# Patient Record
Sex: Female | Born: 1970 | Race: Black or African American | Hispanic: No | Marital: Single | Smoking: Never smoker
Health system: Southern US, Community
[De-identification: ages and names within clinical notes are randomized; demographics above are authoritative.]

## PROBLEM LIST (undated history)

## (undated) DIAGNOSIS — F32A Depression, unspecified: Secondary | ICD-10-CM

## (undated) DIAGNOSIS — G4730 Sleep apnea, unspecified: Secondary | ICD-10-CM

## (undated) DIAGNOSIS — I10 Essential (primary) hypertension: Secondary | ICD-10-CM

## (undated) DIAGNOSIS — T7840XA Allergy, unspecified, initial encounter: Secondary | ICD-10-CM

## (undated) DIAGNOSIS — E119 Type 2 diabetes mellitus without complications: Secondary | ICD-10-CM

## (undated) DIAGNOSIS — I639 Cerebral infarction, unspecified: Secondary | ICD-10-CM

## (undated) DIAGNOSIS — I509 Heart failure, unspecified: Secondary | ICD-10-CM

## (undated) DIAGNOSIS — D649 Anemia, unspecified: Secondary | ICD-10-CM

## (undated) DIAGNOSIS — IMO0001 Reserved for inherently not codable concepts without codable children: Secondary | ICD-10-CM

## (undated) DIAGNOSIS — F419 Anxiety disorder, unspecified: Secondary | ICD-10-CM

## (undated) DIAGNOSIS — E785 Hyperlipidemia, unspecified: Secondary | ICD-10-CM

## (undated) DIAGNOSIS — I517 Cardiomegaly: Secondary | ICD-10-CM

## (undated) DIAGNOSIS — I4891 Unspecified atrial fibrillation: Secondary | ICD-10-CM

## (undated) HISTORY — DX: Anemia, unspecified: D64.9

## (undated) HISTORY — DX: Sleep apnea, unspecified: G47.30

## (undated) HISTORY — DX: Morbid (severe) obesity due to excess calories: E66.01

## (undated) HISTORY — DX: Depression, unspecified: F32.A

## (undated) HISTORY — DX: Heart failure, unspecified: I50.9

## (undated) HISTORY — DX: Hyperlipidemia, unspecified: E78.5

## (undated) HISTORY — DX: Essential (primary) hypertension: I10

## (undated) HISTORY — DX: Cerebral infarction, unspecified: I63.9

## (undated) HISTORY — DX: Allergy, unspecified, initial encounter: T78.40XA

---
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
## 1997-07-18 ENCOUNTER — Ambulatory Visit (HOSPITAL_COMMUNITY): Admission: RE | Admit: 1997-07-18 | Discharge: 1997-07-18 | Payer: Self-pay | Admitting: Obstetrics

## 1997-07-18 ENCOUNTER — Other Ambulatory Visit: Admission: RE | Admit: 1997-07-18 | Discharge: 1997-07-18 | Payer: Self-pay | Admitting: Obstetrics

## 1997-09-11 ENCOUNTER — Inpatient Hospital Stay (HOSPITAL_COMMUNITY): Admission: AD | Admit: 1997-09-11 | Discharge: 1997-09-11 | Payer: Self-pay | Admitting: Obstetrics

## 1997-09-12 ENCOUNTER — Ambulatory Visit (HOSPITAL_COMMUNITY): Admission: RE | Admit: 1997-09-12 | Discharge: 1997-09-12 | Payer: Self-pay | Admitting: Obstetrics

## 1997-11-09 ENCOUNTER — Ambulatory Visit (HOSPITAL_COMMUNITY): Admission: RE | Admit: 1997-11-09 | Discharge: 1997-11-09 | Payer: Self-pay | Admitting: Obstetrics

## 1997-11-23 ENCOUNTER — Inpatient Hospital Stay (HOSPITAL_COMMUNITY): Admission: RE | Admit: 1997-11-23 | Discharge: 1997-11-23 | Payer: Self-pay | Admitting: Obstetrics

## 1997-12-26 ENCOUNTER — Inpatient Hospital Stay (HOSPITAL_COMMUNITY): Admission: AD | Admit: 1997-12-26 | Discharge: 1997-12-26 | Payer: Self-pay | Admitting: Obstetrics

## 1998-01-08 ENCOUNTER — Inpatient Hospital Stay (HOSPITAL_COMMUNITY): Admission: AD | Admit: 1998-01-08 | Discharge: 1998-01-08 | Payer: Self-pay | Admitting: Obstetrics

## 1998-02-20 ENCOUNTER — Inpatient Hospital Stay (HOSPITAL_COMMUNITY): Admission: AD | Admit: 1998-02-20 | Discharge: 1998-02-20 | Payer: Self-pay | Admitting: Obstetrics

## 1998-02-26 ENCOUNTER — Inpatient Hospital Stay (HOSPITAL_COMMUNITY): Admission: AD | Admit: 1998-02-26 | Discharge: 1998-02-26 | Payer: Self-pay | Admitting: Obstetrics

## 1998-03-06 ENCOUNTER — Inpatient Hospital Stay (HOSPITAL_COMMUNITY): Admission: AD | Admit: 1998-03-06 | Discharge: 1998-03-06 | Payer: Self-pay | Admitting: Obstetrics

## 1998-03-11 ENCOUNTER — Inpatient Hospital Stay (HOSPITAL_COMMUNITY): Admission: AD | Admit: 1998-03-11 | Discharge: 1998-03-11 | Payer: Self-pay | Admitting: Obstetrics

## 1998-03-13 ENCOUNTER — Inpatient Hospital Stay (HOSPITAL_COMMUNITY): Admission: AD | Admit: 1998-03-13 | Discharge: 1998-03-16 | Payer: Self-pay | Admitting: Obstetrics

## 1998-03-16 ENCOUNTER — Encounter (HOSPITAL_COMMUNITY): Admission: RE | Admit: 1998-03-16 | Discharge: 1998-06-14 | Payer: Self-pay | Admitting: Obstetrics

## 1998-03-17 ENCOUNTER — Inpatient Hospital Stay (HOSPITAL_COMMUNITY): Admission: AD | Admit: 1998-03-17 | Discharge: 1998-03-17 | Payer: Self-pay | Admitting: Obstetrics

## 1998-03-22 ENCOUNTER — Inpatient Hospital Stay (HOSPITAL_COMMUNITY): Admission: AD | Admit: 1998-03-22 | Discharge: 1998-03-22 | Payer: Self-pay | Admitting: Obstetrics

## 1998-03-22 ENCOUNTER — Encounter: Payer: Self-pay | Admitting: *Deleted

## 1998-12-20 ENCOUNTER — Emergency Department (HOSPITAL_COMMUNITY): Admission: EM | Admit: 1998-12-20 | Discharge: 1998-12-20 | Payer: Self-pay | Admitting: Emergency Medicine

## 1999-07-21 ENCOUNTER — Emergency Department (HOSPITAL_COMMUNITY): Admission: EM | Admit: 1999-07-21 | Discharge: 1999-07-21 | Payer: Self-pay | Admitting: Emergency Medicine

## 1999-09-28 ENCOUNTER — Emergency Department (HOSPITAL_COMMUNITY): Admission: EM | Admit: 1999-09-28 | Discharge: 1999-09-28 | Payer: Self-pay | Admitting: Emergency Medicine

## 1999-10-27 ENCOUNTER — Encounter: Payer: Self-pay | Admitting: Family Medicine

## 1999-10-27 ENCOUNTER — Ambulatory Visit (HOSPITAL_COMMUNITY): Admission: RE | Admit: 1999-10-27 | Discharge: 1999-10-27 | Payer: Self-pay | Admitting: Family Medicine

## 2000-04-03 ENCOUNTER — Emergency Department (HOSPITAL_COMMUNITY): Admission: EM | Admit: 2000-04-03 | Discharge: 2000-04-03 | Payer: Self-pay | Admitting: Emergency Medicine

## 2000-10-24 ENCOUNTER — Emergency Department (HOSPITAL_COMMUNITY): Admission: EM | Admit: 2000-10-24 | Discharge: 2000-10-24 | Payer: Self-pay | Admitting: Emergency Medicine

## 2001-12-19 ENCOUNTER — Other Ambulatory Visit: Admission: RE | Admit: 2001-12-19 | Discharge: 2001-12-19 | Payer: Self-pay | Admitting: *Deleted

## 2001-12-27 ENCOUNTER — Encounter: Payer: Self-pay | Admitting: *Deleted

## 2001-12-27 ENCOUNTER — Ambulatory Visit (HOSPITAL_COMMUNITY): Admission: RE | Admit: 2001-12-27 | Discharge: 2001-12-27 | Payer: Self-pay | Admitting: *Deleted

## 2002-01-01 ENCOUNTER — Emergency Department (HOSPITAL_COMMUNITY): Admission: EM | Admit: 2002-01-01 | Discharge: 2002-01-01 | Payer: Self-pay

## 2002-03-18 ENCOUNTER — Emergency Department (HOSPITAL_COMMUNITY): Admission: EM | Admit: 2002-03-18 | Discharge: 2002-03-18 | Payer: Self-pay | Admitting: Emergency Medicine

## 2002-07-07 ENCOUNTER — Emergency Department (HOSPITAL_COMMUNITY): Admission: EM | Admit: 2002-07-07 | Discharge: 2002-07-08 | Payer: Self-pay | Admitting: Emergency Medicine

## 2002-07-07 ENCOUNTER — Encounter: Payer: Self-pay | Admitting: Emergency Medicine

## 2002-12-24 ENCOUNTER — Emergency Department (HOSPITAL_COMMUNITY): Admission: EM | Admit: 2002-12-24 | Discharge: 2002-12-24 | Payer: Self-pay | Admitting: Emergency Medicine

## 2003-10-05 ENCOUNTER — Other Ambulatory Visit: Admission: RE | Admit: 2003-10-05 | Discharge: 2003-10-05 | Payer: Self-pay | Admitting: Family Medicine

## 2006-03-31 ENCOUNTER — Other Ambulatory Visit: Admission: RE | Admit: 2006-03-31 | Discharge: 2006-03-31 | Payer: Self-pay | Admitting: Family Medicine

## 2006-05-16 ENCOUNTER — Inpatient Hospital Stay (HOSPITAL_COMMUNITY): Admission: AD | Admit: 2006-05-16 | Discharge: 2006-05-16 | Payer: Self-pay | Admitting: Obstetrics and Gynecology

## 2007-08-22 ENCOUNTER — Emergency Department (HOSPITAL_COMMUNITY): Admission: EM | Admit: 2007-08-22 | Discharge: 2007-08-22 | Payer: Self-pay | Admitting: Emergency Medicine

## 2008-11-04 ENCOUNTER — Ambulatory Visit: Payer: Self-pay | Admitting: Vascular Surgery

## 2008-11-04 ENCOUNTER — Emergency Department (HOSPITAL_COMMUNITY): Admission: EM | Admit: 2008-11-04 | Discharge: 2008-11-04 | Payer: Self-pay | Admitting: Emergency Medicine

## 2008-11-04 ENCOUNTER — Emergency Department (HOSPITAL_COMMUNITY): Admission: EM | Admit: 2008-11-04 | Discharge: 2008-11-04 | Payer: Self-pay | Admitting: Family Medicine

## 2008-11-04 ENCOUNTER — Encounter (INDEPENDENT_AMBULATORY_CARE_PROVIDER_SITE_OTHER): Payer: Self-pay | Admitting: Emergency Medicine

## 2008-11-28 ENCOUNTER — Ambulatory Visit: Payer: Self-pay | Admitting: Internal Medicine

## 2008-11-28 DIAGNOSIS — J309 Allergic rhinitis, unspecified: Secondary | ICD-10-CM | POA: Insufficient documentation

## 2008-11-28 DIAGNOSIS — D509 Iron deficiency anemia, unspecified: Secondary | ICD-10-CM | POA: Insufficient documentation

## 2008-11-28 DIAGNOSIS — I10 Essential (primary) hypertension: Secondary | ICD-10-CM | POA: Insufficient documentation

## 2008-11-28 LAB — CONVERTED CEMR LAB
ALT: 38 units/L — ABNORMAL HIGH (ref 0–35)
AST: 36 units/L (ref 0–37)
Albumin: 3.7 g/dL (ref 3.5–5.2)
Alkaline Phosphatase: 76 units/L (ref 39–117)
BUN: 10 mg/dL (ref 6–23)
Basophils Absolute: 0 10*3/uL (ref 0.0–0.1)
Basophils Relative: 0.2 % (ref 0.0–3.0)
Bilirubin, Direct: 0 mg/dL (ref 0.0–0.3)
CO2: 29 meq/L (ref 19–32)
Calcium: 9.2 mg/dL (ref 8.4–10.5)
Chloride: 100 meq/L (ref 96–112)
Creatinine, Ser: 0.6 mg/dL (ref 0.4–1.2)
Eosinophils Absolute: 0.1 10*3/uL (ref 0.0–0.7)
Eosinophils Relative: 1.2 % (ref 0.0–5.0)
Folate: 10.1 ng/mL
GFR calc non Af Amer: 145.27 mL/min (ref >60)
Glucose, Bld: 114 mg/dL — ABNORMAL HIGH (ref 70–99)
HCT: 32.4 % — ABNORMAL LOW (ref 36.0–46.0)
Hemoglobin: 10.1 g/dL — ABNORMAL LOW (ref 12.0–15.0)
Iron: 46 ug/dL (ref 42–145)
Lymphocytes Relative: 32 % (ref 12.0–46.0)
Lymphs Abs: 2 10*3/uL (ref 0.7–4.0)
MCHC: 31.1 g/dL (ref 30.0–36.0)
MCV: 72.2 fL — ABNORMAL LOW (ref 78.0–100.0)
Monocytes Absolute: 0.5 10*3/uL (ref 0.1–1.0)
Monocytes Relative: 7.5 % (ref 3.0–12.0)
Neutro Abs: 3.5 10*3/uL (ref 1.4–7.7)
Neutrophils Relative %: 59.1 % (ref 43.0–77.0)
Platelets: 309 10*3/uL (ref 150.0–400.0)
Potassium: 3.5 meq/L (ref 3.5–5.1)
RBC: 4.49 M/uL (ref 3.87–5.11)
RDW: 18 % — ABNORMAL HIGH (ref 11.5–14.6)
Saturation Ratios: 10.4 % — ABNORMAL LOW (ref 20.0–50.0)
Sodium: 138 meq/L (ref 135–145)
TSH: 0.75 microintl units/mL (ref 0.35–5.50)
Total Bilirubin: 0.4 mg/dL (ref 0.3–1.2)
Total Protein: 7.8 g/dL (ref 6.0–8.3)
Transferrin: 316.3 mg/dL (ref 212.0–360.0)
Vitamin B-12: 524 pg/mL (ref 211–911)
WBC: 6.1 10*3/uL (ref 4.5–10.5)

## 2008-11-29 ENCOUNTER — Encounter: Payer: Self-pay | Admitting: Internal Medicine

## 2008-11-29 ENCOUNTER — Telehealth: Payer: Self-pay | Admitting: Internal Medicine

## 2008-12-18 ENCOUNTER — Ambulatory Visit: Payer: Self-pay | Admitting: Internal Medicine

## 2008-12-18 ENCOUNTER — Encounter (INDEPENDENT_AMBULATORY_CARE_PROVIDER_SITE_OTHER): Payer: Self-pay | Admitting: *Deleted

## 2008-12-19 ENCOUNTER — Telehealth: Payer: Self-pay | Admitting: Internal Medicine

## 2008-12-19 ENCOUNTER — Encounter (INDEPENDENT_AMBULATORY_CARE_PROVIDER_SITE_OTHER): Payer: Self-pay | Admitting: *Deleted

## 2008-12-20 ENCOUNTER — Encounter: Payer: Self-pay | Admitting: Internal Medicine

## 2009-01-01 ENCOUNTER — Encounter: Payer: Self-pay | Admitting: Internal Medicine

## 2009-03-06 ENCOUNTER — Ambulatory Visit: Payer: Self-pay | Admitting: Internal Medicine

## 2009-03-24 ENCOUNTER — Ambulatory Visit: Payer: Self-pay | Admitting: Diagnostic Radiology

## 2009-08-22 ENCOUNTER — Other Ambulatory Visit: Admission: RE | Admit: 2009-08-22 | Discharge: 2009-08-22 | Payer: Self-pay | Admitting: Internal Medicine

## 2009-08-22 ENCOUNTER — Ambulatory Visit: Payer: Self-pay | Admitting: Internal Medicine

## 2009-08-22 DIAGNOSIS — N760 Acute vaginitis: Secondary | ICD-10-CM | POA: Insufficient documentation

## 2009-08-22 LAB — CONVERTED CEMR LAB
Basophils Absolute: 0 10*3/uL (ref 0.0–0.1)
Basophils Relative: 0.5 % (ref 0.0–3.0)
Chlamydia, DNA Probe: NEGATIVE
Clue Cells Wet Prep HPF POC: NONE SEEN
Eosinophils Absolute: 0.1 10*3/uL (ref 0.0–0.7)
Eosinophils Relative: 0.6 % (ref 0.0–5.0)
GC Probe Amp, Genital: NEGATIVE
HCT: 31.5 % — ABNORMAL LOW (ref 36.0–46.0)
Hemoglobin: 9.8 g/dL — ABNORMAL LOW (ref 12.0–15.0)
Iron: 37 ug/dL — ABNORMAL LOW (ref 42–145)
Lymphocytes Relative: 21.2 % (ref 12.0–46.0)
Lymphs Abs: 1.8 10*3/uL (ref 0.7–4.0)
MCHC: 31.3 g/dL (ref 30.0–36.0)
MCV: 70 fL — ABNORMAL LOW (ref 78.0–100.0)
Monocytes Absolute: 0.4 10*3/uL (ref 0.1–1.0)
Monocytes Relative: 4.6 % (ref 3.0–12.0)
Neutro Abs: 6.2 10*3/uL (ref 1.4–7.7)
Neutrophils Relative %: 73.1 % (ref 43.0–77.0)
Pap Smear: NEGATIVE
Platelets: 327 10*3/uL (ref 150.0–400.0)
RBC: 4.5 M/uL (ref 3.87–5.11)
RDW: 17.9 % — ABNORMAL HIGH (ref 11.5–14.6)
Saturation Ratios: 7.9 % — ABNORMAL LOW (ref 20.0–50.0)
Transferrin: 333.7 mg/dL (ref 212.0–360.0)
Trich, Wet Prep: NONE SEEN
WBC: 8.5 10*3/uL (ref 4.5–10.5)
Yeast Wet Prep HPF POC: NONE SEEN
hCG, Beta Chain, Quant, S: <0.5 milliintl units/mL

## 2009-08-22 LAB — HM PAP SMEAR

## 2009-08-26 ENCOUNTER — Telehealth: Payer: Self-pay | Admitting: Internal Medicine

## 2009-08-28 ENCOUNTER — Encounter: Payer: Self-pay | Admitting: Internal Medicine

## 2010-02-20 ENCOUNTER — Emergency Department (HOSPITAL_BASED_OUTPATIENT_CLINIC_OR_DEPARTMENT_OTHER): Admission: EM | Admit: 2010-02-20 | Discharge: 2009-03-24 | Payer: Self-pay | Admitting: Emergency Medicine

## 2010-04-13 LAB — CONVERTED CEMR LAB
ALT: 24 units/L (ref 0–35)
AST: 29 units/L (ref 0–37)
Albumin: 3.7 g/dL (ref 3.5–5.2)
Alkaline Phosphatase: 74 units/L (ref 39–117)
BUN: 2 mg/dL — ABNORMAL LOW (ref 6–23)
Basophils Absolute: 0 10*3/uL (ref 0.0–0.1)
Basophils Relative: 0.9 % (ref 0.0–3.0)
Bilirubin Urine: NEGATIVE
Bilirubin, Direct: 0 mg/dL (ref 0.0–0.3)
CO2: 30 meq/L (ref 19–32)
Calcium: 8.9 mg/dL (ref 8.4–10.5)
Chloride: 103 meq/L (ref 96–112)
Creatinine, Ser: 0.6 mg/dL (ref 0.4–1.2)
Eosinophils Absolute: 0.1 10*3/uL (ref 0.0–0.7)
Eosinophils Relative: 2 % (ref 0.0–5.0)
GFR calc non Af Amer: 145.05 mL/min (ref >60)
Glucose, Bld: 98 mg/dL (ref 70–99)
HCT: 32.8 % — ABNORMAL LOW (ref 36.0–46.0)
Hemoglobin: 10.2 g/dL — ABNORMAL LOW (ref 12.0–15.0)
Iron: 11 ug/dL — ABNORMAL LOW (ref 42–145)
Ketones, ur: NEGATIVE mg/dL
Leukocytes, UA: NEGATIVE
Lymphocytes Relative: 38 % (ref 12.0–46.0)
Lymphs Abs: 1.8 10*3/uL (ref 0.7–4.0)
MCHC: 31.1 g/dL (ref 30.0–36.0)
MCV: 74.6 fL — ABNORMAL LOW (ref 78.0–100.0)
Monocytes Absolute: 0.3 10*3/uL (ref 0.1–1.0)
Monocytes Relative: 7.4 % (ref 3.0–12.0)
Neutro Abs: 2.5 10*3/uL (ref 1.4–7.7)
Neutrophils Relative %: 51.7 % (ref 43.0–77.0)
Nitrite: NEGATIVE
Platelets: 317 10*3/uL (ref 150.0–400.0)
Potassium: 3.5 meq/L (ref 3.5–5.1)
RBC: 4.4 M/uL (ref 3.87–5.11)
RDW: 17.2 % — ABNORMAL HIGH (ref 11.5–14.6)
Saturation Ratios: 2.6 % — ABNORMAL LOW (ref 20.0–50.0)
Sodium: 139 meq/L (ref 135–145)
Specific Gravity, Urine: 1.01 (ref 1.000–1.030)
TSH: 0.58 microintl units/mL (ref 0.35–5.50)
Total Bilirubin: 0.5 mg/dL (ref 0.3–1.2)
Total Protein: 7.5 g/dL (ref 6.0–8.3)
Transferrin: 296.7 mg/dL (ref 212.0–360.0)
Urine Glucose: NEGATIVE mg/dL
Urobilinogen, UA: 0.2 (ref 0.0–1.0)
WBC: 4.7 10*3/uL (ref 4.5–10.5)
pH: 8.5 (ref 5.0–8.0)

## 2010-04-17 NOTE — Assessment & Plan Note (Signed)
Summary: VAGINAL DISCHARGE/ WANTS A PAP/NWS   Vital Signs:  Patient profile:   40 year old female Menstrual status:  regular LMP:     07/28/2009 Height:      62 inches Weight:      274 pounds O2 Sat:      98 % on Room air Temp:     99.1 degrees F oral Pulse rate:   80 / minute Pulse rhythm:   regular Resp:     16 per minute BP sitting:   138 / 88  O2 Flow:  Room air  Primary Care Provider:  Etta Grandchild MD   History of Present Illness:  Vaginal Discharge      This is a 40 year old woman who presents with Vaginal discharge.  The symptoms began 2 weeks ago.  The severity is described as moderate.  The patient reports itching and vaginal burning, but denies burning on urination, frequency, urgency, fever, pelvic pain, and back pain.  The discharge is described as white and purulent and foul-smelling.  The patient denies the following symptoms: genital sores, unusual vaginal bleeding, painful intercourse, rash, myalgias, arthralgias, and headache.    Preventive Screening-Counseling & Management  Alcohol-Tobacco     Alcohol drinks/day: 0     Smoking Status: never  Hep-HIV-STD-Contraception     Hepatitis Risk: no risk noted     HIV Risk: no risk noted     STD Risk: risk noted     STD Risk Counseling: to avoid increased STD risk      Sexual History:  currently monogamous.        Drug Use:  never.        Blood Transfusions:  no.    Medications Prior to Update: 1)  Flax Seed Oil 1000 Mg Caps (Flaxseed (Linseed)) .... One By Mouth Once Daily  Current Medications (verified): 1)  Flax Seed Oil 1000 Mg Caps (Flaxseed (Linseed)) .... One By Mouth Once Daily 2)  Metronidazole 500 Mg Tabs (Metronidazole) .... One By Mouth Two Times A Day For 7 Days  Allergies (verified): 1)  ! Lisinopril (Lisinopril)  Past History:  Past Medical History: Last updated: 12/18/2008 Anemia-NOS Hypertension Allergic rhinitis morbid obesity  Past Surgical History: Last updated:  12/18/2008 right knee 1992 - ?arthroscopic  Family History: Last updated: 11/28/2008 Family History High cholesterol Family History Hypertension  Social History: Last updated: 11/28/2008 Occupation: Pre K teacher Married Never Smoked Alcohol use-no Drug use-no Regular exercise-yes  Risk Factors: Alcohol Use: 0 (08/22/2009) Exercise: yes (11/28/2008)  Risk Factors: Smoking Status: never (08/22/2009)  Family History: Reviewed history from 11/28/2008 and no changes required. Family History High cholesterol Family History Hypertension  Social History: Reviewed history from 11/28/2008 and no changes required. Occupation: Pre K teacher Married Never Smoked Alcohol use-no Drug use-no Regular exercise-yes Hepatitis Risk:  no risk noted HIV Risk:  no risk noted STD Risk:  risk noted Sexual History:  currently monogamous Drug Use:  never Blood Transfusions:  no  Review of Systems  The patient denies anorexia, fever, weight loss, chest pain, headaches, hemoptysis, abdominal pain, hematuria, genital sores, suspicious skin lesions, abnormal bleeding, and enlarged lymph nodes.   GU:  Complains of discharge; denies abnormal vaginal bleeding, decreased libido, dysuria, genital sores, hematuria, nocturia, urinary frequency, and urinary hesitancy.  Physical Exam  General:  obese, alert, well-developed, well-nourished, and cooperative to examination.   overweight-appearing.   Mouth:  Oral mucosa and oropharynx without lesions or exudates.  Teeth in good repair.  Neck:  supple, full ROM, no masses, no thyromegaly, no JVD, normal carotid upstroke, and no carotid bruits.   Lungs:  normal respiratory effort, no intercostal retractions or use of accessory muscles; normal breath sounds bilaterally - no crackles and no wheezes.    Heart:  normal rate, regular rhythm, no murmur, and no rub. BLE without edema.  Abdomen:  soft, non-tender, normal bowel sounds, no distention, no masses, no  guarding, no hepatomegaly, and no splenomegaly.   Genitalia:  vaginal discharge. very scant and foul/fishy odor. normal introitus, no external lesions, mucosa pink and moist, no vaginal or cervical lesions, no vaginal atrophy, no friaility or hemorrhage, normal uterus size and position, no adnexal masses or tenderness, and vaginal discharge.   Msk:  normal ROM, no joint tenderness, no joint swelling, no joint warmth, no redness over joints, and no joint deformities.   Pulses:  R and L carotid,radial,femoral,dorsalis pedis and posterior tibial pulses are full and equal bilaterally Extremities:  No clubbing, cyanosis, edema, or deformity noted with normal full range of motion of all joints.   Neurologic:  No cranial nerve deficits noted. Station and gait are normal. Plantar reflexes are down-going bilaterally. DTRs are symmetrical throughout. Sensory, motor and coordinative functions appear intact. Skin:  turgor normal, color normal, no rashes, no suspicious lesions, no ecchymoses, no ulcerations, and no edema.   Cervical Nodes:  no anterior cervical adenopathy and no posterior cervical adenopathy.   Inguinal Nodes:  no R inguinal adenopathy and no L inguinal adenopathy.   Psych:  Cognition and judgment appear intact. Alert and cooperative with normal attention span and concentration. No apparent delusions, illusions, hallucinations   Impression & Recommendations:  Problem # 1:  VAGINITIS (ICD-616.10) this appears to be BV or gardnerella, wil screen for other causes  Her updated medication list for this problem includes:    Metronidazole 500 Mg Tabs (Metronidazole) ..... One by mouth two times a day for 7 days  Orders: TLB-Wet [place] / Fungus (87210-WPREP) T-Chlamydia Probe, genital [phone]) T-GC Probe, genital [phone]) Venipuncture (29562) TLB-CBC Platelet - w/Differential (85025-CBCD) TLB-IBC Pnl (Iron/FE;Transferrin) (83550-IBC) TLB-Preg Serum Quant (B-hCG)  (84702-HCG-QN)  Problem # 2:  ANEMIA-NOS (ICD-285.9) Assessment: Unchanged  Orders: Venipuncture (13086) TLB-CBC Platelet - w/Differential (85025-CBCD) TLB-IBC Pnl (Iron/FE;Transferrin) (83550-IBC) TLB-Preg Serum Quant (B-hCG) (84702-HCG-QN)  Hgb: 10.2 (03/06/2009)   Hct: 32.8 (03/06/2009)   Platelets: 317.0 (03/06/2009) RBC: 4.40 (03/06/2009)   RDW: 17.2 H % (03/06/2009)   WBC: 4.7 (03/06/2009) MCV: 74.6 (03/06/2009)   MCHC: 31.1 (03/06/2009) Iron: 11 (03/06/2009)   % Sat: 2.6 (03/06/2009) B12: 524 (11/28/2008)   Folate: 10.1 (11/28/2008)   TSH: 0.58 (03/06/2009)  Problem # 3:  HYPERTENSION (ICD-401.9) Assessment: Improved  BP today: 138/88 Prior BP: 124/74 (03/06/2009)  Prior 10 Yr Risk Heart Disease: Not enough information (11/28/2008)  Labs Reviewed: K+: 3.5 (03/06/2009) Creat: : 0.6 (03/06/2009)     Problem # 4:  MORBID OBESITY (ICD-278.01) Assessment: Unchanged  Ht: 62 (08/22/2009)   Wt: 274 (08/22/2009)   BMI: 50.30 (03/06/2009)  Complete Medication List: 1)  Flax Seed Oil 1000 Mg Caps (Flaxseed (linseed)) .... One by mouth once daily 2)  Metronidazole 500 Mg Tabs (Metronidazole) .... One by mouth two times a day for 7 days  Patient Instructions: 1)  Please schedule a follow-up appointment in 2 weeks. 2)  Take your antibiotic as prescribed until ALL of it is gone, but stop if you develop a rash or swelling and contact our office as soon as possible. Prescriptions:  METRONIDAZOLE 500 MG TABS (METRONIDAZOLE) One by mouth two times a day for 7 days  #14 x 0   Entered and Authorized by:   Etta Grandchild MD   Signed by:   Etta Grandchild MD on 08/22/2009   Method used:   Electronically to        [facility] [phone]* (retail)       [address]       [place], [place]  [address]       Ph: [phone]       Fax: [phone]   RxID:   [id number]

## 2010-04-17 NOTE — Letter (Signed)
Summary: Results Follow-up Letter  [facility]  [address] [place], [place] [address]   Phone: [phone]  Fax: [phone]    08/28/2009  [address] [place], [place]  [address]  Dear Ms. Polsky,   The following are the results of your recent test(s):  Test     Result     Pap Smear    Normal___xx____  Not Normal_____        Comments:trichomonas infection noted    _________________________________________________________  Please call for an appointment soon _________________________________________________________ _________________________________________________________ _________________________________________________________  Sincerely,  Sanda Linger MD [facility]

## 2010-04-17 NOTE — Progress Notes (Signed)
  Phone Note Call from Patient Call back at Home Phone [phone]   Caller: Patient Summary of Call: Patient called requesting results of recent labs. Thanks Initial call taken by: Rock Nephew CMA,  August 26, 2009 4:39 PM  Follow-up for Phone Call        mild anemia and low iron level, infection screens were negative, still waiting for PAP results Follow-up by: Etta Grandchild MD,  August 27, 2009 7:14 AM  Additional Follow-up for Phone Call Additional follow up Details #1::        Patient notified and pap letter mailed Additional Follow-up by: Rock Nephew CMA,  August 28, 2009 9:23 AM

## 2010-05-13 ENCOUNTER — Ambulatory Visit (INDEPENDENT_AMBULATORY_CARE_PROVIDER_SITE_OTHER): Payer: BC Managed Care – PPO | Admitting: Internal Medicine

## 2010-05-13 ENCOUNTER — Other Ambulatory Visit: Payer: Self-pay | Admitting: Internal Medicine

## 2010-05-13 ENCOUNTER — Encounter: Payer: Self-pay | Admitting: Internal Medicine

## 2010-05-13 ENCOUNTER — Other Ambulatory Visit: Payer: BC Managed Care – PPO

## 2010-05-13 DIAGNOSIS — D649 Anemia, unspecified: Secondary | ICD-10-CM

## 2010-05-13 DIAGNOSIS — J309 Allergic rhinitis, unspecified: Secondary | ICD-10-CM

## 2010-05-13 LAB — CBC WITH DIFFERENTIAL/PLATELET
Basophils Absolute: 0 10*3/uL (ref 0.0–0.1)
Basophils Relative: 0.1 % (ref 0.0–3.0)
Eosinophils Absolute: 0.1 10*3/uL (ref 0.0–0.7)
Eosinophils Relative: 3.3 % (ref 0.0–5.0)
HCT: 28.8 % — ABNORMAL LOW (ref 36.0–46.0)
Hemoglobin: 9 g/dL — ABNORMAL LOW (ref 12.0–15.0)
Lymphocytes Relative: 22.5 % (ref 12.0–46.0)
Lymphs Abs: 0.6 10*3/uL — ABNORMAL LOW (ref 0.7–4.0)
MCHC: 31.3 g/dL (ref 30.0–36.0)
MCV: 67.5 fl — ABNORMAL LOW (ref 78.0–100.0)
Monocytes Absolute: 0.5 10*3/uL (ref 0.1–1.0)
Monocytes Relative: 17.7 % — ABNORMAL HIGH (ref 3.0–12.0)
Neutro Abs: 1.6 10*3/uL (ref 1.4–7.7)
Neutrophils Relative %: 56.4 % (ref 43.0–77.0)
Platelets: 196 10*3/uL (ref 150.0–400.0)
RBC: 4.3 Mil/uL (ref 3.87–5.11)
RDW: 19.3 % — ABNORMAL HIGH (ref 11.5–14.6)
WBC: 2.8 10*3/uL — ABNORMAL LOW (ref 4.5–10.5)

## 2010-05-13 LAB — TSH: TSH: 0.7 u[IU]/mL (ref 0.35–5.50)

## 2010-05-13 LAB — IBC PANEL
Iron: 160 ug/dL — ABNORMAL HIGH (ref 42–145)
Saturation Ratios: 38.6 % (ref 20.0–50.0)
Transferrin: 296.3 mg/dL (ref 212.0–360.0)

## 2010-05-14 ENCOUNTER — Encounter: Payer: Self-pay | Admitting: Internal Medicine

## 2010-05-14 ENCOUNTER — Ambulatory Visit: Payer: Self-pay | Admitting: Internal Medicine

## 2010-05-22 NOTE — Assessment & Plan Note (Signed)
Summary: BP PROBLEM / DRY COUGH /FATIGUE/JONES/NWS   Vital Signs:  Patient profile:   40 year old female Menstrual status:  regular Height:      62 inches Weight:      266 pounds BMI:     48.83 O2 Sat:      95 % on Room air Temp:     98.8 degrees F oral Pulse rate:   89 / minute BP sitting:   144 / 88  (left arm) Cuff size:   large  Vitals Entered By: Bill Salinas CMA (May 13, 2010 9:54 AM)  O2 Flow:  Room air CC: pt here for eval of elevated BP with weakness and fatigue-dry cough/ ab   Primary Care Provider:  Etta Grandchild MD  CC:  pt here for eval of elevated BP with weakness and fatigue-dry cough/ ab.  History of Present Illness: Patient presents with multiple medical problems: 1. fatigue and malaise for 3 weeks with sudden on-set. She is able to go to work but that's it.  2. She feels sick: alternating between sweats and cold. She has diffuse myalgias. She has had a cough since Saturday. Pressure in the sinus area above her eyes. Her cough is productive of yellowish sputum. She has productive rhinorrhea with a colored mucus. She has a metallic taste in her mouth 3. She has diffuse chest pain, not worse with inspiration. She denies SOB. This started after her cough. No family h/o CAD in young women.  4. Tightness in her neck with question of lymph nodes in the posterior cervical chain.   Chart review: Sept '09 anemia with iron deficiency. Dec '10 Fe 11, % sat 2.6%; June '11 Hgb 9.8, Fe 37, % sat 7.9%  Current Medications (verified): 1)  Flax Seed Oil 1000 Mg Caps (Flaxseed (Linseed)) .... One By Mouth Once Daily  Allergies (verified): 1)  ! Lisinopril (Lisinopril)  Past History:  Past Medical History: Last updated: 12/18/2008 Anemia-NOS Hypertension Allergic rhinitis morbid obesity  Past Surgical History: Last updated: 12/18/2008 right knee 1992 - ?arthroscopic  Family History: Father - '52: CAD/CHF, h/o MI and has an AICD Mother- '49: HTN Family  History High cholesterol Family History Hypertension  Review of Systems       The patient complains of chest pain and headaches.  The patient denies anorexia, fever, weight loss, weight gain, syncope, peripheral edema, abdominal pain, severe indigestion/heartburn, muscle weakness, difficulty walking, unusual weight change, abnormal bleeding, and enlarged lymph nodes.         She admits to snoring, no report of breath-holding.  Physical Exam  General:  obese AA female who appears very tired. Head:  normocephalic and atraumatic.  Mild tenderness to percussion over the frontal and maxillary tenderness Eyes:  pupils equal and pupils round.   Ears:  EAC TMs normal Nose:  no external deformity and no external erythema.   Neck:  supple, full ROM, and no thyromegaly.   Chest Wall:  mild tenderness to light percussion/palpation anterior chest wall Lungs:  normal respiratory effort, no crackles, and no wheezes.   Heart:  normal rate and regular rhythm.   Abdomen:  obese Msk:  normal ROM, no joint tenderness, no joint swelling, and no joint warmth.   Pulses:  2+ radial pulse Neurologic:  alert & oriented X3 and cranial nerves II-XII intact.     Impression & Recommendations:  Problem # 1:  ALLERGIC RHINITIS (ICD-477.9) patient with h/o allergic rhinnitis now with possible infection.  Plan - Amoxicillin  875 mg  two times a day           nasal saline  Problem # 2:  ANEMIA-NOS (ICD-285.9)  Patinet with previous lab with profound iron deficiency with Fe % sat of 2.6% up to 7.9% This may be a cause of her fatigue.  Plan - lab: iron panel and Hgb           iron replacement therapy - 325mg  two times a day.  Her updated medication list for this problem includes:    Ferrous Gluconate 325 (37.5 Fe) Mg Tabs (Ferrous gluconate) .Marland Kitchen... 1 by mouth two times a day for iron deficiency anemai  Orders: TLB-IBC Pnl (Iron/FE;Transferrin) (83550-IBC) TLB-CBC Platelet - w/Differential  (85025-CBCD) TLB-TSH (Thyroid Stimulating Hormone) (04540-JWJ) Patient: Lindsay Gomez Note: All result statuses are Final unless otherwise noted.  Tests: (1) IBC Panel (IBC)   Iron                 [H]  160 ug/dL                   19-147   Transferrin               296.3 mg/dL                 829.5-621.3   Iron Saturation           38.6 %                      20.0-50.0  Tests: (2) CBC Platelet w/Diff (CBCD)   White Cell Count     [L]  2.8 K/uL                    4.5-10.5   Red Cell Count            4.30 Mil/uL                 3.87-5.11   Hemoglobin           [L]  9.0 g/dL                    08.6-57.8   Hematocrit           [L]  28.8 %                      36.0-46.0   MCV                  [L]                              78.0-100.0       RESULT: 67.5 Repeated and verified X2. fl   MCHC                      31.3 g/dL                   46.9-62.9   RDW                  [H]  19.3 %                      11.5-14.6   Platelet Count            196.0 K/uL  150.0-400.0   Neutrophil %              56.4 %                      43.0-77.0   Lymphocyte %              22.5 %                      12.0-46.0   Monocyte %           [H]  17.7 %                      3.0-12.0   Eosinophils%              3.3 %                       0.0-5.0   Basophils %               0.1 %                       0.0-3.0   Neutrophill Absolute      1.6 K/uL                    1.4-7.7   Lymphocyte Absolute  [L]  0.6 K/uL                    0.7-4.0   Monocyte Absolute         0  Problem # 3:  MORBID OBESITY (ICD-278.01) Concern for obesity related OSA with chronic fatigue, snoring and large body habitis.  Plan - if fatigue continues after correction of anemia she may need a sleep study.   Problem # 4:  SNORING (ICD-786.09)  Complete Medication List: 1)  Flax Seed Oil 1000 Mg Caps (Flaxseed (linseed)) .... One by mouth once daily 2)  Amoxicillin 875 Mg Tabs (Amoxicillin) .Marland Kitchen.. 1 by mouth two times a day for  sinus infection 3)  Ferrous Gluconate 325 (37.5 Fe) Mg Tabs (Ferrous gluconate) .Marland Kitchen.. 1 by mouth two times a day for iron deficiency anemai  Patient Instructions: 1)  Sinus infection - very possible with tenderness, mucus and chills. Plan - amoxicillin two times a day for 7 days; nasal saline spray; if there is a lot of pressure you may take sudafed 30mg  two times a day. 2)  Anemia - you have a history of severe iron deficiency anemia. Plan - repeat lab today. Take iron tablets two times a day long term. 3)  Snoring - if you remain fatigued after treating anemia you need to talk with Dr. Yetta Barre about a possible sleep study. 4)  Weight management - you REALLY need to get your weight under-control: smart food choice, limit your portion sizes, exercise and shoot to loose 2 lbs per month until you reach a target weight of 175 lbs (91 lbs over 5 years.) Prescriptions: FERROUS GLUCONATE 325 (37.5 FE) MG TABS (FERROUS GLUCONATE) 1 by mouth two times a day for iron deficiency anemai  #60 x 12   Entered and Authorized by:   Jacques Navy MD   Signed by:   Jacques Navy MD on 05/13/2010   Method used:   Electronically to        [facility] [phone]* (retail)  [address]       [place], [place]  [address]       Ph: [phone]       Fax: [phone]   RxID:   [id number] AMOXICILLIN 875 MG TABS (AMOXICILLIN) 1 by mouth two times a day for sinus infection  #14 x 0   Entered and Authorized by:   Jacques Navy MD   Signed by:   Jacques Navy MD on 05/13/2010   Method used:   Electronically to        [facility] [phone]* (retail)       [address]       [place], [place]  [address]       Ph: [phone]       Fax: [phone]   RxID:   [id number]    Orders Added: 1)  TLB-IBC Pnl (Iron/FE;Transferrin) [83550-IBC] 2)  TLB-CBC Platelet - w/Differential [85025-CBCD] 3)  TLB-TSH (Thyroid Stimulating Hormone) [84443-TSH] 4)  Est. Patient Level III [27[address]]

## 2010-05-22 NOTE — Letter (Signed)
[facility] [address] [place], [place]  [address] Phone: [phone]      May 16, 2010   Lindsay Gomez [address] [place], [place] [address]  RE:  LAB RESULTS  Dear  Ms. Parrilla,  The following is an interpretation of your most recent lab tests.  Please take note of any instructions provided or changes to medications that have resulted from your lab work.  ELECTROLYTES:  Fair - review at your next visit   CBC:  Fair - review at your next visit Iron levels much better.  Continue to take your iron.   Sincerely Yours,    Jacques Navy MD Patient: Lindsay Gomez Note: All result statuses are Final unless otherwise noted.  Tests: (1) IBC Panel (IBC)   Iron                 [H]  160 ug/dL                   13-244   Transferrin               296.3 mg/dL                 010.2-725.3   Iron Saturation           38.6 %                      20.0-50.0  Tests: (2) CBC Platelet w/Diff (CBCD)   White Cell Count     [L]  2.8 K/uL                    4.5-10.5   Red Cell Count            4.30 Mil/uL                 3.87-5.11   Hemoglobin           [L]  9.0 g/dL                    66.4-40.3   Hematocrit           [L]  28.8 %                      36.0-46.0   MCV                  [L]                              78.0-100.0       RESULT: 67.5 Repeated and verified X2. fl   MCHC                      31.3 g/dL                   47.4-25.9   RDW                  [H]  19.3 %                      11.5-14.6   Platelet Count            196.0 K/uL                  150.0-400.0   Neutrophil %  56.4 %                      43.0-77.0   Lymphocyte %              22.5 %                      12.0-46.0   Monocyte %           [H]  17.7 %                      3.0-12.0   Eosinophils%              3.3 %                       0.0-5.0   Basophils %               0.1 %                       0.0-3.0   Neutrophill Absolute      1.6 K/uL                    1.4-7.7  Lymphocyte Absolute  [L]  0.6 K/uL                    0.7-4.0   Monocyte Absolute         0.5 K/uL                    0.1-1.0  Eosinophils, Absolute                             0.1 K/uL                    0.0-0.7   Basophils Absolute        0.0 K/uL                    0.0-0.1  Tests: (3) TSH (TSH)   FastTSH                   0.70 uIU/mL                 0.35-5.50

## 2010-06-09 ENCOUNTER — Ambulatory Visit (INDEPENDENT_AMBULATORY_CARE_PROVIDER_SITE_OTHER): Payer: BC Managed Care – PPO | Admitting: Internal Medicine

## 2010-06-09 ENCOUNTER — Encounter: Payer: Self-pay | Admitting: Internal Medicine

## 2010-06-09 VITALS — BP 140/84 | HR 62 | Temp 98.9°F | Ht 62.0 in | Wt 267.0 lb

## 2010-06-09 DIAGNOSIS — J309 Allergic rhinitis, unspecified: Secondary | ICD-10-CM

## 2010-06-09 DIAGNOSIS — Z0000 Encounter for general adult medical examination without abnormal findings: Secondary | ICD-10-CM

## 2010-06-09 DIAGNOSIS — I10 Essential (primary) hypertension: Secondary | ICD-10-CM

## 2010-06-09 DIAGNOSIS — D649 Anemia, unspecified: Secondary | ICD-10-CM

## 2010-06-09 MED ORDER — CETIRIZINE HCL 10 MG PO TABS: 10.0000 mg | ORAL_TABLET | Freq: Every day | ORAL | Status: DC

## 2010-06-09 NOTE — Assessment & Plan Note (Signed)
Will recheck CBC and iron stores today

## 2010-06-09 NOTE — Patient Instructions (Signed)
Iron Deficiency Anemia  There are many types of anemia. Iron deficiency anemia is the most common. Iron deficiency anemia is a decrease in the number of red blood cells caused by too little iron. Without enough iron, your body does not produce enough hemoglobin. Hemoglobin is a substance in red blood cells that carries oxygen to the body's tissues. Iron deficiency anemia may leave you tired and short of breath.  CAUSES  · Lack of iron in the diet.   · This may be seen in infants and children, because there is little iron in milk.   · This may be seen in adults who do not eat enough iron-rich foods.   · This may be seen in pregnant or breastfeeding women who do not take iron supplements. There is a much higher need for iron intake at these times.   · Poor absorption of iron, as seen with intestinal disorders, such as surgical removal of the small intestines or intestinal bypass.   · Intestinal bleeding.   · Heavy periods.   SYMPTOMS  Mild anemia may not be noticeable. Symptoms may include:  · Fatigue.   · Headache.   · Pale skin.   · Weakness.   · Shortness of breath.   · Dizziness.   · Cold hands and feet.   · Fast or irregular heartbeat.   DIAGNOSIS  Diagnosis requires a thorough evaluation and physical exam by your caregiver.  · Blood tests are generally used to confirm iron deficiency anemia.   · Additional tests may be done to find the underlying cause of your anemia. This may include:   · Testing for blood in the stool (fecal occult blood test).   · A procedure to see inside the colon and rectum (colonoscopy).   · A procedure to see inside the esophagus and stomach (endoscopy).   TREATMENT  · Correcting the cause of the iron deficiency is the first step.   · Medicines, such as oral contraceptives, can make heavy menstrual flows lighter.   · Antibiotics and other medicines can be used to treat peptic ulcers.   · Surgery may be needed to remove a bleeding polyp, tumor, or fibroid.   · Often, iron supplements  (ferrous sulfate) are taken.   · For the best iron absorption, take these supplements with an empty stomach.   · You may need to take the supplements with food if you cannot tolerate them on an empty stomach. Vitamin C improves the absorption of iron. Your caregiver might recommend taking your iron tablets with a glass of orange juice or vitamin C supplement.   · Milk and antacids should not be taken at the same time as iron supplements. They may interfere with the absorption of iron.   · Iron supplements can cause constipation. A stool softener is often recommended.   · Pregnant and breastfeeding women will need to take extra iron, because their normal diet usually will not provide the required amount.   · Patients who cannot tolerate iron by mouth can take it through a vein (intravenously) or by an injection into the muscle.   HOME CARE INSTRUCTIONS  · Ask your dietitian for help with diet questions.   · Take iron and vitamins as directed by your caregiver.   · Eat a diet rich in iron. Eat liver, lean beef, whole-grain bread, eggs, dried fruit, and dark green, leafy vegetables.   SEEK IMMEDIATE MEDICAL CARE IF:  · You have a fainting episode. Do not   drive yourself. Call your local emergency services (911 in [place] if no other help is available.   · You have chest pain, nausea, or vomiting.   · You develop severe or increased shortness of breath with activities.   · You develop weakness or increased thirst.   · You have a rapid heartbeat.   · You develop unexplained sweating or become lightheaded when getting up from a chair or bed.   MAKE SURE YOU:  · Understand these instructions.   · Will watch your condition.   · Will get help right away if you are not doing well or get worse.   Document Released: 02/28/2000 Document Re-Released: 08/20/2009  [employer]® Patient Information ©2011 [employer].

## 2010-06-09 NOTE — Assessment & Plan Note (Signed)
Start zyrtec, avoid allergens

## 2010-06-09 NOTE — Assessment & Plan Note (Signed)
She is doing well with lifestyle modifications, no meds needed as of now

## 2010-06-09 NOTE — Progress Notes (Addendum)
  Subjective:    Patient ID: Lindsay Gomez, female    DOB: [birth year], 40 y.o.   MRN: [id number]  HPI She returns for f/up and tells me that she is doing well though she has nasal allergy symptoms. She is not tolerating the iron tablets very well.    Review of Systems  Constitutional: Negative for fever, chills, diaphoresis, activity change, appetite change, fatigue and unexpected weight change.  HENT: Positive for congestion, rhinorrhea, sneezing and postnasal drip. Negative for hearing loss, sore throat, facial swelling, mouth sores, trouble swallowing, neck pain, neck stiffness, dental problem, sinus pressure and ear discharge.   Eyes: Negative for pain, discharge and itching.  Respiratory: Negative for cough, shortness of breath, wheezing and stridor.   Cardiovascular: Negative for chest pain, palpitations and leg swelling.  Gastrointestinal: Negative for nausea, abdominal pain, diarrhea, constipation, blood in stool, abdominal distention, anal bleeding and rectal pain.  Skin: Negative for color change, pallor and rash.  Neurological: Negative for dizziness, tremors, syncope, facial asymmetry, speech difficulty, light-headedness, numbness and headaches.  Hematological: Negative for adenopathy.  Psychiatric/Behavioral: Negative for hallucinations, behavioral problems, confusion, dysphoric mood, decreased concentration and agitation.       Objective:   Physical Exam  Constitutional: She is oriented to person, place, and time. She appears well-developed and well-nourished. No distress.  HENT:  Head: Normocephalic and atraumatic.  Right Ear: External ear normal.  Left Ear: External ear normal.  Mouth/Throat: No oropharyngeal exudate.  Eyes: Conjunctivae and EOM are normal. Pupils are equal, round, and reactive to light. Right eye exhibits no discharge. Left eye exhibits no discharge. No scleral icterus.  Neck: Normal range of motion. Neck supple. No tracheal deviation present. No  thyromegaly present.  Cardiovascular: Normal rate, regular rhythm, normal heart sounds and intact distal pulses.  Exam reveals no gallop and no friction rub.   No murmur heard. Pulmonary/Chest: Effort normal and breath sounds normal. No respiratory distress. She has no wheezes. She has no rales. She exhibits no tenderness.  Abdominal: Soft. Bowel sounds are normal. She exhibits no distension and no mass. There is no tenderness. There is no rebound and no guarding.  Musculoskeletal: She exhibits no edema and no tenderness.  Lymphadenopathy:    She has no cervical adenopathy.  Neurological: She is alert and oriented to person, place, and time. She has normal reflexes.  Skin: Skin is warm and dry. No rash noted. She is not diaphoretic. No erythema. No pallor.  Psychiatric: She has a normal mood and affect. Her behavior is normal. Judgment and thought content normal.   Lab Results  Component Value Date   WBC 2.8* 05/13/2010   HGB 9.0* 05/13/2010   HCT 28.8* 05/13/2010   PLT 196.0 05/13/2010   ALT 24 03/06/2009   AST 29 03/06/2009   NA 139 03/06/2009   K 3.5 03/06/2009   CL 103 03/06/2009   CREATININE 0.6 03/06/2009   BUN 2* 03/06/2009   CO2 30 03/06/2009   TSH 0.70 05/13/2010         Assessment & Plan:

## 2010-06-10 ENCOUNTER — Encounter: Payer: Self-pay | Admitting: Internal Medicine

## 2010-06-21 LAB — D-DIMER, QUANTITATIVE: D-Dimer, Quant: 0.6 ug/mL-FEU — ABNORMAL HIGH (ref 0.00–0.48)

## 2010-06-21 LAB — DIFFERENTIAL
Basophils Absolute: 0 10*3/uL (ref 0.0–0.1)
Basophils Relative: 1 % (ref 0–1)
Eosinophils Absolute: 0.1 10*3/uL (ref 0.0–0.7)
Eosinophils Relative: 1 % (ref 0–5)
Lymphocytes Relative: 28 % (ref 12–46)
Lymphs Abs: 1.7 10*3/uL (ref 0.7–4.0)
Monocytes Absolute: 0.6 10*3/uL (ref 0.1–1.0)
Monocytes Relative: 9 % (ref 3–12)
Neutro Abs: 3.7 10*3/uL (ref 1.7–7.7)
Neutrophils Relative %: 61 % (ref 43–77)

## 2010-06-21 LAB — CBC
HCT: 33.7 % — ABNORMAL LOW (ref 36.0–46.0)
Hemoglobin: 10.4 g/dL — ABNORMAL LOW (ref 12.0–15.0)
MCHC: 30.9 g/dL (ref 30.0–36.0)
MCV: 72.4 fL — ABNORMAL LOW (ref 78.0–100.0)
Platelets: 418 10*3/uL — ABNORMAL HIGH (ref 150–400)
RBC: 4.65 MIL/uL (ref 3.87–5.11)
RDW: 18.5 % — ABNORMAL HIGH (ref 11.5–15.5)
WBC: 6.2 10*3/uL (ref 4.0–10.5)

## 2010-07-04 ENCOUNTER — Ambulatory Visit (INDEPENDENT_AMBULATORY_CARE_PROVIDER_SITE_OTHER): Payer: BC Managed Care – PPO | Admitting: Internal Medicine

## 2010-07-04 ENCOUNTER — Other Ambulatory Visit (INDEPENDENT_AMBULATORY_CARE_PROVIDER_SITE_OTHER): Payer: BC Managed Care – PPO

## 2010-07-04 ENCOUNTER — Encounter: Payer: Self-pay | Admitting: Internal Medicine

## 2010-07-04 ENCOUNTER — Other Ambulatory Visit: Payer: Self-pay | Admitting: Internal Medicine

## 2010-07-04 VITALS — BP 118/82 | HR 88 | Temp 98.7°F | Resp 16 | Wt 271.5 lb

## 2010-07-04 DIAGNOSIS — J309 Allergic rhinitis, unspecified: Secondary | ICD-10-CM

## 2010-07-04 DIAGNOSIS — R609 Edema, unspecified: Secondary | ICD-10-CM

## 2010-07-04 DIAGNOSIS — I10 Essential (primary) hypertension: Secondary | ICD-10-CM

## 2010-07-04 DIAGNOSIS — D649 Anemia, unspecified: Secondary | ICD-10-CM

## 2010-07-04 LAB — CBC WITH DIFFERENTIAL/PLATELET
Basophils Absolute: 0 10*3/uL (ref 0.0–0.1)
Basophils Relative: 0.1 % (ref 0.0–3.0)
Eosinophils Absolute: 0 10*3/uL (ref 0.0–0.7)
Eosinophils Relative: 0.8 % (ref 0.0–5.0)
HCT: 28.9 % — ABNORMAL LOW (ref 36.0–46.0)
Hemoglobin: 8.9 g/dL — ABNORMAL LOW (ref 12.0–15.0)
Lymphocytes Relative: 39.6 % (ref 12.0–46.0)
Lymphs Abs: 2.5 10*3/uL (ref 0.7–4.0)
MCHC: 30.8 g/dL (ref 30.0–36.0)
MCV: 67.8 fl — ABNORMAL LOW (ref 78.0–100.0)
Monocytes Absolute: 0.4 10*3/uL (ref 0.1–1.0)
Monocytes Relative: 5.8 % (ref 3.0–12.0)
Neutro Abs: 3.4 10*3/uL (ref 1.4–7.7)
Neutrophils Relative %: 53.7 % (ref 43.0–77.0)
Platelets: 266 10*3/uL (ref 150.0–400.0)
RBC: 4.26 Mil/uL (ref 3.87–5.11)
RDW: 20.3 % — ABNORMAL HIGH (ref 11.5–14.6)
WBC: 6.3 10*3/uL (ref 4.5–10.5)

## 2010-07-04 LAB — COMPREHENSIVE METABOLIC PANEL
ALT: 27 U/L (ref 0–35)
AST: 33 U/L (ref 0–37)
Albumin: 3.6 g/dL (ref 3.5–5.2)
Alkaline Phosphatase: 67 U/L (ref 39–117)
BUN: 9 mg/dL (ref 6–23)
CO2: 30 mEq/L (ref 19–32)
Calcium: 8.6 mg/dL (ref 8.4–10.5)
Chloride: 103 mEq/L (ref 96–112)
Creatinine, Ser: 0.8 mg/dL (ref 0.4–1.2)
GFR: 106.38 mL/min (ref >60.00)
Glucose, Bld: 102 mg/dL — ABNORMAL HIGH (ref 70–99)
Potassium: 3.5 mEq/L (ref 3.5–5.1)
Sodium: 142 mEq/L (ref 135–145)
Total Bilirubin: 0.2 mg/dL — ABNORMAL LOW (ref 0.3–1.2)
Total Protein: 6.9 g/dL (ref 6.0–8.3)

## 2010-07-04 LAB — TSH: TSH: 0.55 u[IU]/mL (ref 0.35–5.50)

## 2010-07-04 LAB — PREGNANCY SERUM, QUANT: hCG, Beta Chain, Quant, S: 0.28 m[IU]/mL

## 2010-07-04 LAB — BRAIN NATRIURETIC PEPTIDE: Pro B Natriuretic peptide (BNP): 152.7 pg/mL — ABNORMAL HIGH (ref 0.0–100.0)

## 2010-07-04 LAB — HEMOGLOBIN A1C: Hgb A1c MFr Bld: 6.2 % (ref 4.6–6.5)

## 2010-07-04 NOTE — Progress Notes (Signed)
Subjective:    Patient ID: Lindsay Gomez, female    DOB: Jul 26, 1970, 40 y.o.   MRN: [id number]  Hypertension This is a chronic problem. The current episode started more than 1 year ago. The problem has been gradually improving since onset. The problem is controlled. Associated symptoms include peripheral edema. Pertinent negatives include no anxiety, blurred vision, chest pain, headaches, malaise/fatigue, neck pain, orthopnea, palpitations, PND, shortness of breath or sweats. Past treatments include nothing. The current treatment provides significant improvement. There are no compliance problems.       Review of Systems  Constitutional: Negative for fever, chills, malaise/fatigue, diaphoresis, activity change, appetite change, fatigue and unexpected weight change.  HENT: Negative for facial swelling, neck pain and neck stiffness.   Eyes: Negative for blurred vision.  Respiratory: Negative for apnea, cough, choking, chest tightness, shortness of breath, wheezing and stridor.   Cardiovascular: Positive for leg swelling. Negative for chest pain, palpitations, orthopnea and PND.  Gastrointestinal: Negative for nausea, vomiting, abdominal pain, diarrhea, constipation, blood in stool and abdominal distention.  Genitourinary: Positive for frequency. Negative for dysuria, urgency, hematuria, flank pain, decreased urine volume, enuresis, difficulty urinating, pelvic pain and dyspareunia.  Musculoskeletal: Negative for myalgias, back pain, joint swelling, arthralgias and gait problem.  Skin: Negative for color change, pallor and rash.  Neurological: Negative for dizziness, tremors, seizures, syncope, facial asymmetry, speech difficulty, weakness, light-headedness, numbness and headaches.  Hematological: Negative for adenopathy. Does not bruise/bleed easily.  Psychiatric/Behavioral: Negative for hallucinations, behavioral problems, confusion, self-injury, dysphoric mood, decreased concentration and  agitation. The patient is not nervous/anxious.        Objective:   Physical Exam  Constitutional: She is oriented to person, place, and time. She appears well-developed and well-nourished. No distress.  HENT:  Head: Normocephalic and atraumatic.  Right Ear: External ear normal.  Left Ear: External ear normal.  Nose: Nose normal.  Mouth/Throat: Oropharynx is clear and moist. No oropharyngeal exudate.  Eyes: Conjunctivae and EOM are normal. Pupils are equal, round, and reactive to light. Right eye exhibits no discharge. Left eye exhibits no discharge. No scleral icterus.  Neck: Normal range of motion. Neck supple. No JVD present. No tracheal deviation present. No thyromegaly present.  Cardiovascular: Normal rate, regular rhythm, normal heart sounds and intact distal pulses.  Exam reveals no gallop and no friction rub.   No murmur heard. Pulmonary/Chest: Effort normal and breath sounds normal. No stridor. No respiratory distress. She has no wheezes. She has no rales. She exhibits no tenderness.  Abdominal: Soft. Bowel sounds are normal. She exhibits no distension and no mass. There is no tenderness. There is no rebound and no guarding.  Musculoskeletal: Normal range of motion. She exhibits edema (trace symmetrical edema in both legs). She exhibits no tenderness.  Lymphadenopathy:    She has no cervical adenopathy.  Neurological: She is alert and oriented to person, place, and time. She has normal reflexes. No cranial nerve deficit. She exhibits normal muscle tone. Coordination normal.  Skin: Skin is warm and dry. No rash noted. She is not diaphoretic. No erythema. No pallor.  Psychiatric: She has a normal mood and affect. Her behavior is normal. Judgment and thought content normal.        Lab Results  Component Value Date   WBC 2.8* 05/13/2010   HGB 9.0* 05/13/2010   HCT 28.8* 05/13/2010   PLT 196.0 05/13/2010   ALT 24 03/06/2009   AST 29 03/06/2009   NA 139 03/06/2009   K 3.5  03/06/2009   CL 103 03/06/2009   CREATININE 0.6 03/06/2009   BUN 2* 03/06/2009   CO2 30 03/06/2009   TSH 0.70 05/13/2010    Assessment & Plan:

## 2010-07-04 NOTE — Patient Instructions (Signed)
Edema Edema is an abnormal build-up of fluids in tissues. Because this is partly dependent on gravity (water flows to the lowest place), it is more common in the lower extremities (legs and thighs). It is also common in the looser tissues, like around the eyes. Painless swelling of the feet and ankles is common and increases as a person ages. It may affect both legs and may include the calves or even thighs. When squeezed, the fluid may move out of the affected area and may leave a dent for a few moments. CAUSES  Prolonged standing or sitting in one place for extended periods of time. Movement helps pump tissue fluid into the veins, and absence of movement prevents this, resulting in edema.   Varicose veins. The valves in the veins do not work as well as they should. This causes fluid to leak into the tissues.   Fluid and salt overload.   Injury, burn, or surgery to the leg, ankle, or foot, may damage veins and allow fluid to leak out.   Sunburn damages vessels. Leaky vessels allow fluid to go out into the sunburned tissues.   Allergies (from insect bites or stings, medications or chemicals) cause swelling by allowing vessels to become leaky.   Protein in the blood helps keep fluid in your vessels. Low protein, as in malnutrition, allows fluid to leak out.   Hormonal changes, including pregnancy and menstruation, cause fluid retention. This fluid may leak out of vessels and cause edema.   Medications that cause fluid retention. Examples are sex hormones, blood pressure medications, steroid treatment, or anti-depressants.   Some illnesses cause edema, especially heart failure, kidney disease, or liver disease.   Surgery that cuts veins or lymph nodes, such as surgery done for the heart or for breast cancer, may result in edema.  DIAGNOSIS Your caregiver is usually easily able to determine what is causing your swelling (edema) by simply asking what is wrong (getting a history) and examining  you (doing a physical). Sometimes x-rays, EKG (electrocardiogram or heart tracing), and blood work may be done to evaluate for underlying medical illness. TREATMENT General treatment includes:  Leg elevation (or elevation of the affected body part).   Restriction of fluid intake.   Prevention of fluid overload.   Compression of the affected body part. Compression with elastic bandages or support stockings squeezes the tissues, preventing fluid from entering and forcing it back into the blood vessels.   Diuretics (also called water pills or fluid pills) pull fluid out of your body in the form of increased urination. These are effective in reducing the swelling, but can have side effects and must be used only under your caregiver's supervision. Diuretics are appropriate only for some types of edema.  The specific treatment can be directed at any underlying causes discovered. Heart, liver, or kidney disease should be treated appropriately. HOME CARE INSTRUCTIONS  Elevate the legs (or affected body part) above the level of the heart, while lying down.   Avoid sitting or standing still for prolonged periods of time.   Avoid putting anything directly under the knees when lying down, and do not wear constricting clothing or garters on the upper legs.   Exercising the legs causes the fluid to work back into the veins and lymphatic channels. This may help the swelling go down.   The pressure applied by elastic bandages or support stockings can help reduce ankle swelling.   A low-salt diet may help reduce fluid retention and decrease the   ankle swelling.   Take any medications exactly as prescribed.  SEEK MEDICAL CARE IF:  Your edema is not responding to recommended treatments.  SEEK IMMEDIATE MEDICAL CARE IF:  You develop shortness of breath or chest pain.   You cannot breathe when you lay down; or if, while lying down, you have to get up and go to the window to get your breath.   You are  having increasing swelling without relief from treatment.   You develop a fever over 100.5.   You develop pain or redness in the areas that are swollen.   Tell your caregiver right away if you have gained 1lb in 1 day or 5 lb in a week.  MAKE SURE YOU:  Understand these instructions.   Will watch your condition.   Will get help right away if you are not doing well or get worse.  Document Released: 03/02/2005 Document Re-Released: 08/20/2009 [facility] Patient Information 2011 [place], [place].

## 2010-07-04 NOTE — Assessment & Plan Note (Signed)
Her BP is well controlled 

## 2010-07-04 NOTE — Assessment & Plan Note (Signed)
No improvement, I will recommend a nutrition referral

## 2010-07-04 NOTE — Assessment & Plan Note (Addendum)
I think this is related to her obesity, her EKG is normal,  today I will check for secondary causes

## 2010-07-04 NOTE — Assessment & Plan Note (Signed)
I will monitor this by doing a cbc today

## 2010-07-07 ENCOUNTER — Telehealth: Payer: Self-pay

## 2010-07-07 ENCOUNTER — Encounter: Payer: Self-pay | Admitting: Internal Medicine

## 2010-07-07 DIAGNOSIS — R609 Edema, unspecified: Secondary | ICD-10-CM

## 2010-07-07 DIAGNOSIS — I10 Essential (primary) hypertension: Secondary | ICD-10-CM

## 2010-07-07 MED ORDER — HYDROCHLOROTHIAZIDE 12.5 MG PO CAPS: 12.5000 mg | ORAL_CAPSULE | Freq: Every day | ORAL | Status: DC

## 2010-07-07 NOTE — Telephone Encounter (Signed)
done

## 2010-07-07 NOTE — Telephone Encounter (Signed)
Patient lmovm stating that she was seen last week and want to know if MD will send in rx that they discussed for fluid. Please advise

## 2010-07-28 ENCOUNTER — Telehealth: Payer: Self-pay | Admitting: *Deleted

## 2010-07-28 NOTE — Telephone Encounter (Signed)
Pt called req lab results - She did not get letter mailed 07/07/10. Advised she needed f/u per MD and scheduled pt for apt.

## 2010-08-08 ENCOUNTER — Ambulatory Visit: Payer: BC Managed Care – PPO | Admitting: Internal Medicine

## 2010-08-08 ENCOUNTER — Telehealth: Payer: Self-pay

## 2010-08-08 NOTE — Telephone Encounter (Signed)
(  see last phone note) patient appt reschedule

## 2010-08-12 ENCOUNTER — Encounter: Payer: Self-pay | Admitting: Internal Medicine

## 2010-08-13 ENCOUNTER — Encounter: Payer: Self-pay | Admitting: Internal Medicine

## 2010-08-13 ENCOUNTER — Other Ambulatory Visit (INDEPENDENT_AMBULATORY_CARE_PROVIDER_SITE_OTHER): Payer: BC Managed Care – PPO

## 2010-08-13 ENCOUNTER — Ambulatory Visit (INDEPENDENT_AMBULATORY_CARE_PROVIDER_SITE_OTHER): Payer: BC Managed Care – PPO | Admitting: Internal Medicine

## 2010-08-13 DIAGNOSIS — R609 Edema, unspecified: Secondary | ICD-10-CM

## 2010-08-13 DIAGNOSIS — I10 Essential (primary) hypertension: Secondary | ICD-10-CM

## 2010-08-13 LAB — BASIC METABOLIC PANEL
BUN: 10 mg/dL (ref 6–23)
CO2: 29 mEq/L (ref 19–32)
Calcium: 8.9 mg/dL (ref 8.4–10.5)
Chloride: 100 mEq/L (ref 96–112)
Creatinine, Ser: 0.7 mg/dL (ref 0.4–1.2)
GFR: 126.71 mL/min (ref >60.00)
Glucose, Bld: 92 mg/dL (ref 70–99)
Potassium: 3.4 mEq/L — ABNORMAL LOW (ref 3.5–5.1)
Sodium: 136 mEq/L (ref 135–145)

## 2010-08-13 LAB — BRAIN NATRIURETIC PEPTIDE: Pro B Natriuretic peptide (BNP): 53 pg/mL (ref 0.0–100.0)

## 2010-08-13 NOTE — Assessment & Plan Note (Addendum)
Her BP has improved, will continue the HCTZ for now and check her lytes and renal function today

## 2010-08-13 NOTE — Patient Instructions (Signed)

## 2010-08-13 NOTE — Assessment & Plan Note (Signed)
Her edema has resolved but the labs showed that her BNP was elevated so I will repeat it today and get an ECHO done to see if the is any evidence of valvular disease or chamber abnomality

## 2010-08-13 NOTE — Progress Notes (Signed)
Subjective:    Patient ID: Lindsay Gomez, female    DOB: 08-15-1970, 40 y.o.   MRN: [id number]  Hypertension This is a chronic problem. The current episode started more than 1 year ago. The problem has been gradually improving since onset. The problem is controlled. Pertinent negatives include no anxiety, blurred vision, chest pain, headaches, malaise/fatigue, neck pain, orthopnea, palpitations, peripheral edema, PND, shortness of breath or sweats. There are no associated agents to hypertension. Past treatments include diuretics. The current treatment provides moderate improvement. Compliance problems include exercise and diet.       Review of Systems  Constitutional: Negative for fever, chills, malaise/fatigue, diaphoresis, activity change, appetite change, fatigue and unexpected weight change.  HENT: Negative for facial swelling, trouble swallowing, neck pain, neck stiffness and voice change.   Eyes: Negative for blurred vision, photophobia and visual disturbance.  Respiratory: Negative for cough, chest tightness, shortness of breath, wheezing and stridor.   Cardiovascular: Negative for chest pain, palpitations, orthopnea, leg swelling and PND.  Gastrointestinal: Negative for nausea, vomiting, abdominal pain, diarrhea, constipation and blood in stool.  Genitourinary: Negative for dysuria, urgency, frequency, hematuria, difficulty urinating and dyspareunia.  Musculoskeletal: Negative for myalgias, back pain, joint swelling, arthralgias and gait problem.  Skin: Negative for color change, pallor and rash.  Neurological: Negative for dizziness, tremors, seizures, syncope, facial asymmetry, speech difficulty, weakness, light-headedness, numbness and headaches.  Hematological: Negative for adenopathy. Does not bruise/bleed easily.  Psychiatric/Behavioral: Negative for suicidal ideas, hallucinations, behavioral problems, confusion, sleep disturbance, self-injury, dysphoric mood, decreased  concentration and agitation. The patient is not nervous/anxious and is not hyperactive.        Objective:   Physical Exam  Constitutional: She is oriented to person, place, and time. She appears well-developed and well-nourished. No distress.  HENT:  Head: Normocephalic and atraumatic.  Right Ear: External ear normal.  Left Ear: External ear normal.  Nose: Nose normal.  Mouth/Throat: Oropharynx is clear and moist. No oropharyngeal exudate.  Eyes: Conjunctivae and EOM are normal. Pupils are equal, round, and reactive to light. Right eye exhibits no discharge. Left eye exhibits no discharge. No scleral icterus.  Neck: Normal range of motion. Neck supple. No JVD present. No tracheal deviation present. No thyromegaly present.  Cardiovascular: Normal rate, regular rhythm, normal heart sounds and intact distal pulses.  Exam reveals no gallop and no friction rub.   No murmur heard. Pulmonary/Chest: Effort normal and breath sounds normal. No stridor. No respiratory distress. She has no wheezes. She has no rales. She exhibits no tenderness.  Abdominal: Soft. Bowel sounds are normal. She exhibits no distension and no mass. There is no tenderness. There is no rebound and no guarding.  Musculoskeletal: Normal range of motion. She exhibits no edema and no tenderness.  Lymphadenopathy:    She has no cervical adenopathy.  Neurological: She is alert and oriented to person, place, and time. She has normal reflexes. She displays normal reflexes. No cranial nerve deficit. She exhibits normal muscle tone. Coordination normal.  Skin: Skin is warm and dry. No rash noted. She is not diaphoretic. No erythema. No pallor.  Psychiatric: She has a normal mood and affect. Her behavior is normal. Judgment and thought content normal.       Lab Results  Component Value Date   WBC 6.3 07/04/2010   HGB 8.9* 07/04/2010   HCT 28.9* 07/04/2010   PLT 266.0 07/04/2010   ALT 27 07/04/2010   AST 33 07/04/2010   NA 142  07/04/2010  K 3.5 07/04/2010   CL 103 07/04/2010   CREATININE 0.8 07/04/2010   BUN 9 07/04/2010   CO2 30 07/04/2010   TSH 0.55 07/04/2010   HGBA1C 6.2 07/04/2010    Assessment & Plan:

## 2010-08-14 ENCOUNTER — Encounter: Payer: Self-pay | Admitting: Internal Medicine

## 2010-08-14 MED ORDER — POTASSIUM CHLORIDE ER 10 MEQ PO TBCR: 10.0000 meq | EXTENDED_RELEASE_TABLET | Freq: Two times a day (BID) | ORAL | Status: DC

## 2010-08-14 NOTE — Progress Notes (Signed)
Addended by: Etta Grandchild on: 08/14/2010 07:30 AM   Modules accepted: Orders

## 2010-08-28 ENCOUNTER — Other Ambulatory Visit (HOSPITAL_COMMUNITY): Payer: BC Managed Care – PPO | Admitting: Radiology

## 2010-09-18 ENCOUNTER — Ambulatory Visit (INDEPENDENT_AMBULATORY_CARE_PROVIDER_SITE_OTHER): Payer: BC Managed Care – PPO | Admitting: Internal Medicine

## 2010-09-18 ENCOUNTER — Encounter: Payer: Self-pay | Admitting: Internal Medicine

## 2010-09-18 VITALS — BP 140/90 | HR 80 | Temp 98.2°F | Resp 16 | Ht 62.0 in | Wt 270.0 lb

## 2010-09-18 DIAGNOSIS — G5601 Carpal tunnel syndrome, right upper limb: Secondary | ICD-10-CM

## 2010-09-18 DIAGNOSIS — E876 Hypokalemia: Secondary | ICD-10-CM

## 2010-09-18 DIAGNOSIS — G5600 Carpal tunnel syndrome, unspecified upper limb: Secondary | ICD-10-CM

## 2010-09-18 DIAGNOSIS — I10 Essential (primary) hypertension: Secondary | ICD-10-CM

## 2010-09-18 MED ORDER — CELECOXIB 200 MG PO CAPS: 200.0000 mg | ORAL_CAPSULE | Freq: Every day | ORAL | Status: DC

## 2010-09-18 NOTE — Assessment & Plan Note (Signed)
Recheck her K+ level today 

## 2010-09-18 NOTE — Assessment & Plan Note (Signed)
BP is well controlled 

## 2010-09-18 NOTE — Progress Notes (Signed)
  Subjective:    Patient ID: Lindsay Gomez, female    DOB: August 31, 1970, 40 y.o.   MRN: [id number]  HPI She returns complaining of worsening pain in her right hand with numbness in her fingers. The pain and numbness is most severe at night. Symptoms started many months ago. She has not taken anything for pain.   Review of Systems  Constitutional: Negative.   HENT: Negative.   Eyes: Negative.   Respiratory: Negative.   Cardiovascular: Negative.   Gastrointestinal: Negative.   Genitourinary: Negative.   Musculoskeletal: Positive for arthralgias (right hand and wrist pain). Negative for myalgias, back pain, joint swelling and gait problem.  Skin: Negative.   Neurological: Positive for numbness. Negative for dizziness, tremors, seizures, syncope, facial asymmetry, speech difficulty, weakness, light-headedness and headaches.  Hematological: Negative.   Psychiatric/Behavioral: Negative.        Objective:   Physical Exam  Vitals reviewed. Constitutional: She is oriented to person, place, and time. She appears well-developed and well-nourished. No distress.  HENT:  Head: Normocephalic and atraumatic.  Right Ear: External ear normal.  Left Ear: External ear normal.  Nose: Nose normal.  Mouth/Throat: Oropharynx is clear and moist. No oropharyngeal exudate.  Eyes: Conjunctivae and EOM are normal. Pupils are equal, round, and reactive to light. Right eye exhibits no discharge. Left eye exhibits no discharge. No scleral icterus.  Neck: Normal range of motion. Neck supple. No JVD present. No tracheal deviation present. No thyromegaly present.  Cardiovascular: Normal rate, regular rhythm, normal heart sounds and intact distal pulses.  Exam reveals no gallop and no friction rub.   No murmur heard. Pulmonary/Chest: Effort normal and breath sounds normal. No stridor. No respiratory distress. She has no wheezes. She has no rales. She exhibits no tenderness.  Abdominal: Soft. Bowel sounds are  normal. She exhibits no distension and no mass. There is no tenderness. There is no rebound and no guarding.  Musculoskeletal: Normal range of motion. She exhibits no edema and no tenderness.       + Tinel's and Phalen's tests in her right wrist  Lymphadenopathy:    She has no cervical adenopathy.  Neurological: She is alert and oriented to person, place, and time. She has normal reflexes. She displays normal reflexes. No cranial nerve deficit. She exhibits normal muscle tone. Coordination normal.  Skin: Skin is warm and dry. No rash noted. She is not diaphoretic. No erythema. No pallor.  Psychiatric: She has a normal mood and affect. Her behavior is normal. Judgment and thought content normal.          Assessment & Plan:

## 2010-09-18 NOTE — Assessment & Plan Note (Addendum)
Start celebrex for pain, I put her in a procare splint today, I will get a NCS/EMG done to see how severe this is (? Need for a release procedure)

## 2010-11-04 ENCOUNTER — Ambulatory Visit (INDEPENDENT_AMBULATORY_CARE_PROVIDER_SITE_OTHER): Payer: BC Managed Care – PPO | Admitting: Endocrinology

## 2010-11-04 ENCOUNTER — Encounter: Payer: Self-pay | Admitting: Endocrinology

## 2010-11-04 VITALS — BP 142/96 | HR 96 | Temp 98.7°F

## 2010-11-04 DIAGNOSIS — G5601 Carpal tunnel syndrome, right upper limb: Secondary | ICD-10-CM

## 2010-11-04 DIAGNOSIS — G5600 Carpal tunnel syndrome, unspecified upper limb: Secondary | ICD-10-CM

## 2010-11-04 MED ORDER — HYDROCODONE-ACETAMINOPHEN 5-325 MG PO TABS: 1.0000 | ORAL_TABLET | ORAL | Status: DC | PRN

## 2010-11-04 NOTE — Progress Notes (Signed)
  Subjective:    Patient ID: Lindsay Gomez, female    DOB: Apr 19, 1970, 40 y.o.   MRN: [id number]  HPI Pt states few mos of moderate pain at the right hand, and assoc numbness.  She is unable to cite precip factor.   She got only temporary relief with celebrex and splint.   Past Medical History  Diagnosis Date  . Anemia   . Hypertension   . Allergy     rhinitis  . Morbid obesity     Past Surgical History  Procedure Date  . Knee surgery 1992    ? arthroscopic/ right    History   Social History  . Marital Status: Married    Spouse Name: N/A    Number of Children: N/A  . Years of Education: N/A   Occupational History  . Not on file.   Social History Main Topics  . Smoking status: Never Smoker   . Smokeless tobacco: Not on file  . Alcohol Use: No  . Drug Use: No  . Sexually Active: Not Currently    Birth Control/ Protection: Coitus interruptus   Other Topics Concern  . Not on file   Social History Narrative   Regular exercise    Current Outpatient Prescriptions on File Prior to Visit  Medication Sig Dispense Refill  . Flaxseed, Linseed, (FLAX SEED OIL) 1000 MG CAPS Take 1 capsule by mouth daily.        . hydrochlorothiazide (MICROZIDE) 12.5 MG capsule Take 1 capsule (12.5 mg total) by mouth daily.  30 capsule  11  . potassium chloride (K-DUR) 10 MEQ tablet Take 1 tablet (10 mEq total) by mouth 2 (two) times daily.  60 tablet  11  . celecoxib (CELEBREX) 200 MG capsule Take 1 capsule (200 mg total) by mouth daily.  30 capsule  0  . cetirizine (ZYRTEC) 10 MG tablet Take 1 tablet (10 mg total) by mouth daily.  30 tablet  2  . ferrous gluconate (FERGON) 325 MG tablet Take 325 mg by mouth 2 (two) times daily.          Allergies  Allergen Reactions  . Lisinopril     REACTION: cough    Family History  Problem Relation Age of Onset  . Hypertension Mother   . Coronary artery disease Father   . Heart failure Father   . Heart attack Father   . Hypertension Other    . Hyperlipidemia Other    BP 142/96  Pulse 96  Temp(Src) 98.7 F (37.1 C) (Oral)  SpO2 99%  LMP 10/12/2010  Review of Systems Denies rash    Objective:   Physical Exam GENERAL: no distress Pulses: radials are intact bilat.   Hands: no deformity.  no ulcer.  normal color and temp.  no edema Neuro: sensation is intact to touch on the hands, but decreased from normal on the right hand.      Assessment & Plan:  Cts, persistent

## 2010-11-04 NOTE — Patient Instructions (Signed)
Refer for a nerve-ending test.  you will receive a phone call, about a day and time for an appointment. Here is a prescription for a pain medication.

## 2010-11-06 ENCOUNTER — Telehealth: Payer: Self-pay | Admitting: *Deleted

## 2010-11-06 MED ORDER — OXYCODONE HCL 5 MG PO TABS: 5.0000 mg | ORAL_TABLET | ORAL | Status: AC | PRN

## 2010-11-06 NOTE — Telephone Encounter (Signed)
Change hydrocodone-apap to oxycodone.  i printed rx.

## 2010-11-06 NOTE — Telephone Encounter (Signed)
Pt c/o no relief from pain med given. She is req increase dose or alt RX to help w/pain which she describes a unbearable.

## 2010-11-07 NOTE — Telephone Encounter (Signed)
Pt informed, Rx in cabinet for pt pick up  

## 2010-11-12 ENCOUNTER — Ambulatory Visit (INDEPENDENT_AMBULATORY_CARE_PROVIDER_SITE_OTHER)
Admission: RE | Admit: 2010-11-12 | Discharge: 2010-11-12 | Disposition: A | Discharge status: Home / Self Care | Payer: BC Managed Care – PPO | Source: Ambulatory Visit | Attending: Internal Medicine | Admitting: Internal Medicine

## 2010-11-12 ENCOUNTER — Ambulatory Visit (INDEPENDENT_AMBULATORY_CARE_PROVIDER_SITE_OTHER): Payer: BC Managed Care – PPO | Admitting: Internal Medicine

## 2010-11-12 ENCOUNTER — Encounter: Payer: Self-pay | Admitting: Internal Medicine

## 2010-11-12 ENCOUNTER — Other Ambulatory Visit (INDEPENDENT_AMBULATORY_CARE_PROVIDER_SITE_OTHER): Payer: BC Managed Care – PPO

## 2010-11-12 DIAGNOSIS — M79641 Pain in right hand: Secondary | ICD-10-CM

## 2010-11-12 DIAGNOSIS — G5601 Carpal tunnel syndrome, right upper limb: Secondary | ICD-10-CM

## 2010-11-12 DIAGNOSIS — E876 Hypokalemia: Secondary | ICD-10-CM

## 2010-11-12 DIAGNOSIS — R609 Edema, unspecified: Secondary | ICD-10-CM

## 2010-11-12 DIAGNOSIS — M79609 Pain in unspecified limb: Secondary | ICD-10-CM

## 2010-11-12 DIAGNOSIS — G5600 Carpal tunnel syndrome, unspecified upper limb: Secondary | ICD-10-CM

## 2010-11-12 DIAGNOSIS — D649 Anemia, unspecified: Secondary | ICD-10-CM

## 2010-11-12 DIAGNOSIS — I10 Essential (primary) hypertension: Secondary | ICD-10-CM

## 2010-11-12 LAB — IBC PANEL
Iron: 13 ug/dL — ABNORMAL LOW (ref 42–145)
Saturation Ratios: 2.6 % — ABNORMAL LOW (ref 20.0–50.0)
Transferrin: 361.1 mg/dL — ABNORMAL HIGH (ref 212.0–360.0)

## 2010-11-12 LAB — CBC WITH DIFFERENTIAL/PLATELET
Basophils Absolute: 0.3 10*3/uL — ABNORMAL HIGH (ref 0.0–0.1)
Basophils Relative: 4 % — ABNORMAL HIGH (ref 0.0–3.0)
Eosinophils Absolute: 0.1 10*3/uL (ref 0.0–0.7)
Eosinophils Relative: 1.2 % (ref 0.0–5.0)
HCT: 27.3 % — ABNORMAL LOW (ref 36.0–46.0)
Hemoglobin: 8.1 g/dL — ABNORMAL LOW (ref 12.0–15.0)
Lymphocytes Relative: 30.7 % (ref 12.0–46.0)
Lymphs Abs: 2.2 10*3/uL (ref 0.7–4.0)
MCHC: 29.7 g/dL — ABNORMAL LOW (ref 30.0–36.0)
MCV: 65.4 fl — ABNORMAL LOW (ref 78.0–100.0)
Monocytes Absolute: 0.5 10*3/uL (ref 0.1–1.0)
Monocytes Relative: 6.4 % (ref 3.0–12.0)
Neutro Abs: 4.2 10*3/uL (ref 1.4–7.7)
Neutrophils Relative %: 57.7 % (ref 43.0–77.0)
Platelets: 479 10*3/uL — ABNORMAL HIGH (ref 150.0–400.0)
RBC: 4.17 Mil/uL (ref 3.87–5.11)
RDW: 21.4 % — ABNORMAL HIGH (ref 11.5–14.6)
WBC: 7.3 10*3/uL (ref 4.5–10.5)

## 2010-11-12 LAB — FERRITIN: Ferritin: 5.2 ng/mL — ABNORMAL LOW (ref 10.0–291.0)

## 2010-11-12 LAB — BASIC METABOLIC PANEL
BUN: 10 mg/dL (ref 6–23)
CO2: 32 mEq/L (ref 19–32)
Calcium: 8.6 mg/dL (ref 8.4–10.5)
Chloride: 102 mEq/L (ref 96–112)
Creatinine, Ser: 0.8 mg/dL (ref 0.4–1.2)
GFR: 109.41 mL/min (ref >60.00)
Glucose, Bld: 97 mg/dL (ref 70–99)
Potassium: 3.4 mEq/L — ABNORMAL LOW (ref 3.5–5.1)
Sodium: 140 mEq/L (ref 135–145)

## 2010-11-12 LAB — MAGNESIUM: Magnesium: 2.1 mg/dL (ref 1.5–2.5)

## 2010-11-12 MED ORDER — NAPROXEN-ESOMEPRAZOLE 500-20 MG PO TBEC: 1.0000 | DELAYED_RELEASE_TABLET | Freq: Two times a day (BID) | ORAL | Status: DC

## 2010-11-12 NOTE — Assessment & Plan Note (Signed)
Her BP is well controlled, I will monitor her renal function today

## 2010-11-12 NOTE — Assessment & Plan Note (Signed)
Plain xray ordered

## 2010-11-12 NOTE — Assessment & Plan Note (Signed)
I await the results of the NCS/EMG, I have asked her to wear the splint all day long, I gave her samples of Vimovo for additional pain relief

## 2010-11-12 NOTE — Assessment & Plan Note (Signed)
I will recheck her BMP today 

## 2010-11-12 NOTE — Assessment & Plan Note (Signed)
This has improved.

## 2010-11-12 NOTE — Progress Notes (Signed)
Subjective:    Patient ID: Lindsay Gomez, female    DOB: 1970-09-08, 40 y.o.   MRN: [id number]  HPI She returns c/o right hand and wrist pain and wants an xray done of her hand. She also c/o numbness and tingling in her right hand. She has been taking narcotics for pain without much relief. She has not tried any nsaids. She has been wearing the wrist splint at night but during the day she has pain at work when she is doing activities. She wants a work note so she can rest her right hand and wrist and get her NCS/EMG done.   Review of Systems  Constitutional: Negative for fever, chills, diaphoresis, activity change, appetite change, fatigue and unexpected weight change.  HENT: Negative for facial swelling, neck pain and neck stiffness.   Eyes: Negative.   Respiratory: Negative for apnea, cough, choking, chest tightness, shortness of breath, wheezing and stridor.   Cardiovascular: Negative for chest pain, palpitations and leg swelling.  Gastrointestinal: Negative for nausea, vomiting, abdominal pain, diarrhea, constipation, blood in stool, abdominal distention, anal bleeding and rectal pain.  Genitourinary: Negative.   Musculoskeletal: Positive for arthralgias (right hand and wrist). Negative for myalgias, back pain, joint swelling and gait problem.  Skin: Negative for color change, pallor, rash and wound.  Neurological: Positive for numbness. Negative for dizziness, tremors, seizures, syncope, facial asymmetry, speech difficulty, weakness, light-headedness and headaches.  Hematological: Negative for adenopathy. Does not bruise/bleed easily.  Psychiatric/Behavioral: Negative.        Objective:   Physical Exam  Vitals reviewed. Constitutional: She appears well-developed and well-nourished. No distress.  HENT:  Head: Normocephalic.  Mouth/Throat: No oropharyngeal exudate.  Eyes: Conjunctivae are normal. Right eye exhibits no discharge. Left eye exhibits no discharge. No scleral  icterus.  Neck: Normal range of motion. Neck supple. No JVD present. No tracheal deviation present. No thyromegaly present.  Cardiovascular: Normal rate, regular rhythm, normal heart sounds and intact distal pulses.  Exam reveals no gallop and no friction rub.   No murmur heard. Pulmonary/Chest: Effort normal and breath sounds normal. No stridor. No respiratory distress. She has no wheezes. She has no rales. She exhibits no tenderness.  Abdominal: Soft. Bowel sounds are normal. She exhibits no distension and no mass. There is no tenderness. There is no rebound and no guarding.  Musculoskeletal: Normal range of motion. She exhibits no edema and no tenderness.       Right wrist: Normal. She exhibits normal range of motion, no tenderness, no bony tenderness, no swelling, no effusion, no crepitus, no deformity and no laceration.       Right hand: Normal. She exhibits normal range of motion, no tenderness, normal capillary refill, no deformity, no laceration and no swelling. normal sensation noted. Normal strength noted.  Lymphadenopathy:    She has no cervical adenopathy.  Neurological: She is alert. She displays no atrophy, no tremor and normal reflexes. No cranial nerve deficit or sensory deficit. She exhibits normal muscle tone. She displays a negative Romberg sign. She displays no seizure activity. Coordination and gait normal. She displays no Babinski's sign on the right side. She displays no Babinski's sign on the left side.  Reflex Scores:      Tricep reflexes are 1+ on the right side and 1+ on the left side.      Bicep reflexes are 1+ on the right side and 1+ on the left side.      Brachioradialis reflexes are 1+ on the right side  and 1+ on the left side.      Patellar reflexes are 1+ on the right side and 1+ on the left side.      Achilles reflexes are 1+ on the right side and 1+ on the left side. Skin: Skin is warm and dry. No rash noted. She is not diaphoretic. No erythema. No pallor.    Psychiatric: She has a normal mood and affect. Her behavior is normal. Judgment and thought content normal.      Lab Results  Component Value Date   WBC 6.3 07/04/2010   HGB 8.9* 07/04/2010   HCT 28.9* 07/04/2010   PLT 266.0 07/04/2010   ALT 27 07/04/2010   AST 33 07/04/2010   NA 136 08/13/2010   K 3.4* 08/13/2010   CL 100 08/13/2010   CREATININE 0.7 08/13/2010   BUN 10 08/13/2010   CO2 29 08/13/2010   TSH 0.55 07/04/2010   HGBA1C 6.2 07/04/2010      Assessment & Plan:

## 2010-11-12 NOTE — Assessment & Plan Note (Signed)
I will recheck her CBC and will check her vitamin levels as well 

## 2010-11-13 ENCOUNTER — Encounter: Payer: Self-pay | Admitting: Internal Medicine

## 2010-11-18 ENCOUNTER — Ambulatory Visit: Payer: BC Managed Care – PPO | Admitting: Neurology

## 2010-11-21 ENCOUNTER — Telehealth: Payer: Self-pay

## 2010-11-21 NOTE — Telephone Encounter (Signed)
Patient called [email or web address] checking status of referral to [facility]. Patient also request lab and xray results.

## 2010-11-28 ENCOUNTER — Other Ambulatory Visit: Payer: Self-pay | Admitting: Internal Medicine

## 2010-11-28 DIAGNOSIS — E876 Hypokalemia: Secondary | ICD-10-CM

## 2010-11-28 DIAGNOSIS — D649 Anemia, unspecified: Secondary | ICD-10-CM

## 2010-12-11 LAB — CBC
HCT: 34 — ABNORMAL LOW
Hemoglobin: 10.9 — ABNORMAL LOW
MCHC: 32.2
MCV: 77.7 — ABNORMAL LOW
Platelets: 329
RBC: 4.38
RDW: 17.7 — ABNORMAL HIGH
WBC: 6.8

## 2010-12-11 LAB — URINALYSIS, ROUTINE W REFLEX MICROSCOPIC
Bilirubin Urine: NEGATIVE
Glucose, UA: NEGATIVE
Hgb urine dipstick: NEGATIVE
Ketones, ur: NEGATIVE
Nitrite: NEGATIVE
Protein, ur: NEGATIVE
Specific Gravity, Urine: 1.021
Urobilinogen, UA: 0.2
pH: 8

## 2010-12-11 LAB — COMPREHENSIVE METABOLIC PANEL
ALT: 16
AST: 22
Albumin: 3.4 — ABNORMAL LOW
Alkaline Phosphatase: 61
BUN: 7
CO2: 27
Calcium: 9.1
Chloride: 105
Creatinine, Ser: 0.67
GFR calc Af Amer: >60
GFR calc non Af Amer: >60
Glucose, Bld: 98
Potassium: 3.8
Sodium: 139
Total Bilirubin: 0.5
Total Protein: 6.7

## 2010-12-11 LAB — DIFFERENTIAL
Basophils Absolute: 0.2 — ABNORMAL HIGH
Basophils Relative: 3 — ABNORMAL HIGH
Eosinophils Absolute: 0.1
Eosinophils Relative: 1
Lymphocytes Relative: 33
Lymphs Abs: 2.3
Monocytes Absolute: 0.5
Monocytes Relative: 8
Neutro Abs: 3.7
Neutrophils Relative %: 55

## 2010-12-11 LAB — POCT PREGNANCY, URINE
Operator id: 264421
Preg Test, Ur: NEGATIVE

## 2010-12-11 LAB — URINE MICROSCOPIC-ADD ON

## 2010-12-11 LAB — LIPASE, BLOOD: Lipase: 30

## 2011-09-24 ENCOUNTER — Encounter: Payer: Self-pay | Admitting: Internal Medicine

## 2011-09-24 ENCOUNTER — Ambulatory Visit (INDEPENDENT_AMBULATORY_CARE_PROVIDER_SITE_OTHER): Payer: BC Managed Care – PPO | Admitting: Internal Medicine

## 2011-09-24 ENCOUNTER — Other Ambulatory Visit (INDEPENDENT_AMBULATORY_CARE_PROVIDER_SITE_OTHER): Payer: BC Managed Care – PPO

## 2011-09-24 DIAGNOSIS — E876 Hypokalemia: Secondary | ICD-10-CM

## 2011-09-24 DIAGNOSIS — IMO0001 Reserved for inherently not codable concepts without codable children: Secondary | ICD-10-CM

## 2011-09-24 DIAGNOSIS — Z23 Encounter for immunization: Secondary | ICD-10-CM

## 2011-09-24 DIAGNOSIS — N920 Excessive and frequent menstruation with regular cycle: Secondary | ICD-10-CM | POA: Insufficient documentation

## 2011-09-24 DIAGNOSIS — M2550 Pain in unspecified joint: Secondary | ICD-10-CM

## 2011-09-24 DIAGNOSIS — D649 Anemia, unspecified: Secondary | ICD-10-CM

## 2011-09-24 DIAGNOSIS — I10 Essential (primary) hypertension: Secondary | ICD-10-CM

## 2011-09-24 DIAGNOSIS — Z0000 Encounter for general adult medical examination without abnormal findings: Secondary | ICD-10-CM | POA: Insufficient documentation

## 2011-09-24 LAB — COMPREHENSIVE METABOLIC PANEL
ALT: 38 U/L — ABNORMAL HIGH (ref 0–35)
AST: 51 U/L — ABNORMAL HIGH (ref 0–37)
Albumin: 3.7 g/dL (ref 3.5–5.2)
Alkaline Phosphatase: 85 U/L (ref 39–117)
BUN: 8 mg/dL (ref 6–23)
CO2: 33 mEq/L — ABNORMAL HIGH (ref 19–32)
Calcium: 10 mg/dL (ref 8.4–10.5)
Chloride: 99 mEq/L (ref 96–112)
Creatinine, Ser: 0.7 mg/dL (ref 0.4–1.2)
GFR: 117.81 mL/min (ref >60.00)
Glucose, Bld: 81 mg/dL (ref 70–99)
Potassium: 4.1 mEq/L (ref 3.5–5.1)
Sodium: 140 mEq/L (ref 135–145)
Total Bilirubin: 0.4 mg/dL (ref 0.3–1.2)
Total Protein: 7.3 g/dL (ref 6.0–8.3)

## 2011-09-24 LAB — IBC PANEL
Iron: 117 ug/dL (ref 42–145)
Saturation Ratios: 24.3 % (ref 20.0–50.0)
Transferrin: 344.4 mg/dL (ref 212.0–360.0)

## 2011-09-24 LAB — URINALYSIS, ROUTINE W REFLEX MICROSCOPIC
Bilirubin Urine: NEGATIVE
Hgb urine dipstick: NEGATIVE
Ketones, ur: NEGATIVE
Nitrite: NEGATIVE
Specific Gravity, Urine: 1.015 (ref 1.000–1.030)
Total Protein, Urine: NEGATIVE
Urine Glucose: NEGATIVE
Urobilinogen, UA: 0.2 (ref 0.0–1.0)
pH: 8 (ref 5.0–8.0)

## 2011-09-24 LAB — FOLATE: Folate: 19.9 ng/mL (ref >5.9)

## 2011-09-24 LAB — CBC WITH DIFFERENTIAL/PLATELET
Basophils Absolute: 0 10*3/uL (ref 0.0–0.1)
Basophils Relative: 0.2 % (ref 0.0–3.0)
Eosinophils Absolute: 0.1 10*3/uL (ref 0.0–0.7)
Eosinophils Relative: 1.7 % (ref 0.0–5.0)
HCT: 30.4 % — ABNORMAL LOW (ref 36.0–46.0)
Hemoglobin: 8.7 g/dL — ABNORMAL LOW (ref 12.0–15.0)
Lymphocytes Relative: 33.4 % (ref 12.0–46.0)
Lymphs Abs: 2.5 10*3/uL (ref 0.7–4.0)
MCHC: 28.5 g/dL — ABNORMAL LOW (ref 30.0–36.0)
MCV: 65 fl — ABNORMAL LOW (ref 78.0–100.0)
Monocytes Absolute: 0.6 10*3/uL (ref 0.1–1.0)
Monocytes Relative: 7.7 % (ref 3.0–12.0)
Neutro Abs: 4.2 10*3/uL (ref 1.4–7.7)
Neutrophils Relative %: 57 % (ref 43.0–77.0)
Platelets: 389 10*3/uL (ref 150.0–400.0)
RBC: 4.68 Mil/uL (ref 3.87–5.11)
RDW: 20 % — ABNORMAL HIGH (ref 11.5–14.6)
WBC: 7.3 10*3/uL (ref 4.5–10.5)

## 2011-09-24 LAB — LIPID PANEL
Cholesterol: 205 mg/dL — ABNORMAL HIGH (ref 0–200)
HDL: 43.5 mg/dL (ref >39.00)
Total CHOL/HDL Ratio: 5
Triglycerides: 87 mg/dL (ref 0.0–149.0)
VLDL: 17.4 mg/dL (ref 0.0–40.0)

## 2011-09-24 LAB — FERRITIN: Ferritin: 8 ng/mL — ABNORMAL LOW (ref 10.0–291.0)

## 2011-09-24 LAB — HEMOGLOBIN A1C: Hgb A1c MFr Bld: 6.5 % (ref 4.6–6.5)

## 2011-09-24 LAB — TSH: TSH: 1.25 u[IU]/mL (ref 0.35–5.50)

## 2011-09-24 LAB — LDL CHOLESTEROL, DIRECT: Direct LDL: 140.4 mg/dL

## 2011-09-24 LAB — VITAMIN B12: Vitamin B-12: 479 pg/mL (ref 211–911)

## 2011-09-24 LAB — C-REACTIVE PROTEIN: CRP: 1 mg/dL (ref 1–20)

## 2011-09-24 LAB — SEDIMENTATION RATE: Sed Rate: 34 mm/hr — ABNORMAL HIGH (ref 0–22)

## 2011-09-24 MED ORDER — NEBIVOLOL HCL 5 MG PO TABS: 5.0000 mg | ORAL_TABLET | Freq: Every day | ORAL | Status: DC

## 2011-09-24 NOTE — Assessment & Plan Note (Signed)
Exam done, vaccines were updated, labs ordered, pt ed material was given 

## 2011-09-24 NOTE — Assessment & Plan Note (Signed)
I will recheck her a1c today and see if she needs to start meds for DM II

## 2011-09-24 NOTE — Patient Instructions (Addendum)
Preventive Care for Adults, Female A healthy lifestyle and preventive care can promote health and wellness. Preventive health guidelines for women include the following key practices.  A routine yearly physical is a good way to check with your caregiver about your health and preventive screening. It is a chance to share any concerns and updates on your health, and to receive a thorough exam.   Visit your dentist for a routine exam and preventive care every 6 months. Brush your teeth twice a day and floss once a day. Good oral hygiene prevents tooth decay and gum disease.   The frequency of eye exams is based on your age, health, family medical history, use of contact lenses, and other factors. Follow your caregiver's recommendations for frequency of eye exams.   Eat a healthy diet. Foods like vegetables, fruits, whole grains, low-fat dairy products, and lean protein foods contain the nutrients you need without too many calories. Decrease your intake of foods high in solid fats, added sugars, and salt. Eat the right amount of calories for you.Get information about a proper diet from your caregiver, if necessary.   Regular physical exercise is one of the most important things you can do for your health. Most adults should get at least 150 minutes of moderate-intensity exercise (any activity that increases your heart rate and causes you to sweat) each week. In addition, most adults need muscle-strengthening exercises on 2 or more days a week.   Maintain a healthy weight. The body mass index (BMI) is a screening tool to identify possible weight problems. It provides an estimate of body fat based on height and weight. Your caregiver can help determine your BMI, and can help you achieve or maintain a healthy weight.For adults 20 years and older:   A BMI below 18.5 is considered underweight.   A BMI of 18.5 to 24.9 is normal.   A BMI of 25 to 29.9 is considered overweight.   A BMI of 30 and above is  considered obese.   Maintain normal blood lipids and cholesterol levels by exercising and minimizing your intake of saturated fat. Eat a balanced diet with plenty of fruit and vegetables. Blood tests for lipids and cholesterol should begin at age 20 and be repeated every 5 years. If your lipid or cholesterol levels are high, you are over 50, or you are at high risk for heart disease, you may need your cholesterol levels checked more frequently.Ongoing high lipid and cholesterol levels should be treated with medicines if diet and exercise are not effective.   If you smoke, find out from your caregiver how to quit. If you do not use tobacco, do not start.   If you are pregnant, do not drink alcohol. If you are breastfeeding, be very cautious about drinking alcohol. If you are not pregnant and choose to drink alcohol, do not exceed 1 drink per day. One drink is considered to be 12 ounces (355 mL) of beer, 5 ounces (148 mL) of wine, or 1.5 ounces (44 mL) of liquor.   Avoid use of street drugs. Do not share needles with anyone. Ask for help if you need support or instructions about stopping the use of drugs.   High blood pressure causes heart disease and increases the risk of stroke. Your blood pressure should be checked at least every 1 to 2 years. Ongoing high blood pressure should be treated with medicines if weight loss and exercise are not effective.   If you are 55 to 39   years old, ask your caregiver if you should take aspirin to prevent strokes.   Diabetes screening involves taking a blood sample to check your fasting blood sugar level. This should be done once every 3 years, after age 45, if you are within normal weight and without risk factors for diabetes. Testing should be considered at a younger age or be carried out more frequently if you are overweight and have at least 1 risk factor for diabetes.   Breast cancer screening is essential preventive care for women. You should practice "breast  self-awareness." This means understanding the normal appearance and feel of your breasts and may include breast self-examination. Any changes detected, no matter how small, should be reported to a caregiver. Women in their 20s and 30s should have a clinical breast exam (CBE) by a caregiver as part of a regular health exam every 1 to 3 years. After age 40, women should have a CBE every year. Starting at age 40, women should consider having a mammography (breast X-ray test) every year. Women who have a family history of breast cancer should talk to their caregiver about genetic screening. Women at a high risk of breast cancer should talk to their caregivers about having magnetic resonance imaging (MRI) and a mammography every year.   The Pap test is a screening test for cervical cancer. A Pap test can show cell changes on the cervix that might become cervical cancer if left untreated. A Pap test is a procedure in which cells are obtained and examined from the lower end of the uterus (cervix).   Women should have a Pap test starting at age 21.   Between ages 21 and 29, Pap tests should be repeated every 2 years.   Beginning at age 30, you should have a Pap test every 3 years as long as the past 3 Pap tests have been normal.   Some women have medical problems that increase the chance of getting cervical cancer. Talk to your caregiver about these problems. It is especially important to talk to your caregiver if a new problem develops soon after your last Pap test. In these cases, your caregiver may recommend more frequent screening and Pap tests.   The above recommendations are the same for women who have or have not gotten the vaccine for human papillomavirus (HPV).   If you had a hysterectomy for a problem that was not cancer or a condition that could lead to cancer, then you no longer need Pap tests. Even if you no longer need a Pap test, a regular exam is a good idea to make sure no other problems are  starting.   If you are between ages 65 and 70, and you have had normal Pap tests going back 10 years, you no longer need Pap tests. Even if you no longer need a Pap test, a regular exam is a good idea to make sure no other problems are starting.   If you have had past treatment for cervical cancer or a condition that could lead to cancer, you need Pap tests and screening for cancer for at least 20 years after your treatment.   If Pap tests have been discontinued, risk factors (such as a new sexual partner) need to be reassessed to determine if screening should be resumed.   The HPV test is an additional test that may be used for cervical cancer screening. The HPV test looks for the virus that can cause the cell changes on the cervix.   The cells collected during the Pap test can be tested for HPV. The HPV test could be used to screen women aged 30 years and older, and should be used in women of any age who have unclear Pap test results. After the age of 30, women should have HPV testing at the same frequency as a Pap test.   Colorectal cancer can be detected and often prevented. Most routine colorectal cancer screening begins at the age of 50 and continues through age 75. However, your caregiver may recommend screening at an earlier age if you have risk factors for colon cancer. On a yearly basis, your caregiver may provide home test kits to check for hidden blood in the stool. Use of a small camera at the end of a tube, to directly examine the colon (sigmoidoscopy or colonoscopy), can detect the earliest forms of colorectal cancer. Talk to your caregiver about this at age 50, when routine screening begins. Direct examination of the colon should be repeated every 5 to 10 years through age 75, unless early forms of pre-cancerous polyps or small growths are found.   Hepatitis C blood testing is recommended for all people born from 1945 through 1965 and any individual with known risks for hepatitis C.    Practice safe sex. Use condoms and avoid high-risk sexual practices to reduce the spread of sexually transmitted infections (STIs). STIs include gonorrhea, chlamydia, syphilis, trichomonas, herpes, HPV, and human immunodeficiency virus (HIV). Herpes, HIV, and HPV are viral illnesses that have no cure. They can result in disability, cancer, and death. Sexually active women aged 25 and younger should be checked for chlamydia. Older women with new or multiple partners should also be tested for chlamydia. Testing for other STIs is recommended if you are sexually active and at increased risk.   Osteoporosis is a disease in which the bones lose minerals and strength with aging. This can result in serious bone fractures. The risk of osteoporosis can be identified using a bone density scan. Women ages 65 and over and women at risk for fractures or osteoporosis should discuss screening with their caregivers. Ask your caregiver whether you should take a calcium supplement or vitamin D to reduce the rate of osteoporosis.   Menopause can be associated with physical symptoms and risks. Hormone replacement therapy is available to decrease symptoms and risks. You should talk to your caregiver about whether hormone replacement therapy is right for you.   Use sunscreen with sun protection factor (SPF) of 30 or more. Apply sunscreen liberally and repeatedly throughout the day. You should seek shade when your shadow is shorter than you. Protect yourself by wearing long sleeves, pants, a wide-brimmed hat, and sunglasses year round, whenever you are outdoors.   Once a month, do a whole body skin exam, using a mirror to look at the skin on your back. Notify your caregiver of new moles, moles that have irregular borders, moles that are larger than a pencil eraser, or moles that have changed in shape or color.   Stay current with required immunizations.   Influenza. You need a dose every fall (or winter). The composition of  the flu vaccine changes each year, so being vaccinated once is not enough.   Pneumococcal polysaccharide. You need 1 to 2 doses if you smoke cigarettes or if you have certain chronic medical conditions. You need 1 dose at age 65 (or older) if you have never been vaccinated.   Tetanus, diphtheria, pertussis (Tdap, Td). Get 1 dose of   Tdap vaccine if you are younger than age 65, are over 65 and have contact with an infant, are a healthcare worker, are pregnant, or simply want to be protected from whooping cough. After that, you need a Td booster dose every 10 years. Consult your caregiver if you have not had at least 3 tetanus and diphtheria-containing shots sometime in your life or have a deep or dirty wound.   HPV. You need this vaccine if you are a woman age 26 or younger. The vaccine is given in 3 doses over 6 months.   Measles, mumps, rubella (MMR). You need at least 1 dose of MMR if you were born in 1957 or later. You may also need a second dose.   Meningococcal. If you are age 19 to 21 and a first-year college student living in a residence hall, or have one of several medical conditions, you need to get vaccinated against meningococcal disease. You may also need additional booster doses.   Zoster (shingles). If you are age 60 or older, you should get this vaccine.   Varicella (chickenpox). If you have never had chickenpox or you were vaccinated but received only 1 dose, talk to your caregiver to find out if you need this vaccine.   Hepatitis A. You need this vaccine if you have a specific risk factor for hepatitis A virus infection or you simply wish to be protected from this disease. The vaccine is usually given as 2 doses, 6 to 18 months apart.   Hepatitis B. You need this vaccine if you have a specific risk factor for hepatitis B virus infection or you simply wish to be protected from this disease. The vaccine is given in 3 doses, usually over 6 months.  Preventive Services /  Frequency Ages 19 to 39  Blood pressure check.** / Every 1 to 2 years.   Lipid and cholesterol check.** / Every 5 years beginning at age 20.   Clinical breast exam.** / Every 3 years for women in their 20s and 30s.   Pap test.** / Every 2 years from ages 21 through 29. Every 3 years starting at age 30 through age 65 or 70 with a history of 3 consecutive normal Pap tests.   HPV screening.** / Every 3 years from ages 30 through ages 65 to 70 with a history of 3 consecutive normal Pap tests.   Hepatitis C blood test.** / For any individual with known risks for hepatitis C.   Skin self-exam. / Monthly.   Influenza immunization.** / Every year.   Pneumococcal polysaccharide immunization.** / 1 to 2 doses if you smoke cigarettes or if you have certain chronic medical conditions.   Tetanus, diphtheria, pertussis (Tdap, Td) immunization. / A one-time dose of Tdap vaccine. After that, you need a Td booster dose every 10 years.   HPV immunization. / 3 doses over 6 months, if you are 26 and younger.   Measles, mumps, rubella (MMR) immunization. / You need at least 1 dose of MMR if you were born in 1957 or later. You may also need a second dose.   Meningococcal immunization. / 1 dose if you are age 19 to 21 and a first-year college student living in a residence hall, or have one of several medical conditions, you need to get vaccinated against meningococcal disease. You may also need additional booster doses.   Varicella immunization.** / Consult your caregiver.   Hepatitis A immunization.** / Consult your caregiver. 2 doses, 6 to 18 months   apart.   Hepatitis B immunization.** / Consult your caregiver. 3 doses usually over 6 months.  Ages 40 to 64  Blood pressure check.** / Every 1 to 2 years.   Lipid and cholesterol check.** / Every 5 years beginning at age 20.   Clinical breast exam.** / Every year after age 40.   Mammogram.** / Every year beginning at age 40 and continuing for as  long as you are in good health. Consult with your caregiver.   Pap test.** / Every 3 years starting at age 30 through age 65 or 70 with a history of 3 consecutive normal Pap tests.   HPV screening.** / Every 3 years from ages 30 through ages 65 to 70 with a history of 3 consecutive normal Pap tests.   Fecal occult blood test (FOBT) of stool. / Every year beginning at age 50 and continuing until age 75. You may not need to do this test if you get a colonoscopy every 10 years.   Flexible sigmoidoscopy or colonoscopy.** / Every 5 years for a flexible sigmoidoscopy or every 10 years for a colonoscopy beginning at age 50 and continuing until age 75.   Hepatitis C blood test.** / For all people born from 1945 through 1965 and any individual with known risks for hepatitis C.   Skin self-exam. / Monthly.   Influenza immunization.** / Every year.   Pneumococcal polysaccharide immunization.** / 1 to 2 doses if you smoke cigarettes or if you have certain chronic medical conditions.   Tetanus, diphtheria, pertussis (Tdap, Td) immunization.** / A one-time dose of Tdap vaccine. After that, you need a Td booster dose every 10 years.   Measles, mumps, rubella (MMR) immunization. / You need at least 1 dose of MMR if you were born in 1957 or later. You may also need a second dose.   Varicella immunization.** / Consult your caregiver.   Meningococcal immunization.** / Consult your caregiver.   Hepatitis A immunization.** / Consult your caregiver. 2 doses, 6 to 18 months apart.   Hepatitis B immunization.** / Consult your caregiver. 3 doses, usually over 6 months.  Ages 65 and over  Blood pressure check.** / Every 1 to 2 years.   Lipid and cholesterol check.** / Every 5 years beginning at age 20.   Clinical breast exam.** / Every year after age 40.   Mammogram.** / Every year beginning at age 40 and continuing for as long as you are in good health. Consult with your caregiver.   Pap test.** /  Every 3 years starting at age 30 through age 65 or 70 with a 3 consecutive normal Pap tests. Testing can be stopped between 65 and 70 with 3 consecutive normal Pap tests and no abnormal Pap or HPV tests in the past 10 years.   HPV screening.** / Every 3 years from ages 30 through ages 65 or 70 with a history of 3 consecutive normal Pap tests. Testing can be stopped between 65 and 70 with 3 consecutive normal Pap tests and no abnormal Pap or HPV tests in the past 10 years.   Fecal occult blood test (FOBT) of stool. / Every year beginning at age 50 and continuing until age 75. You may not need to do this test if you get a colonoscopy every 10 years.   Flexible sigmoidoscopy or colonoscopy.** / Every 5 years for a flexible sigmoidoscopy or every 10 years for a colonoscopy beginning at age 50 and continuing until age 75.   Hepatitis   C blood test.** / For all people born from [birth year] through 1965 and any individual with known risks for hepatitis C.   Osteoporosis screening.** / A one-time screening for women ages 58 and over and women at risk for fractures or osteoporosis.   Skin self-exam. / Monthly.   Influenza immunization.** / Every year.   Pneumococcal polysaccharide immunization.** / 1 dose at age 50 (or older) if you have never been vaccinated.   Tetanus, diphtheria, pertussis (Tdap, Td) immunization. / A one-time dose of Tdap vaccine if you are over 65 and have contact with an infant, are a Research scientist (physical sciences), or simply want to be protected from whooping cough. After that, you need a Td booster dose every 10 years.   Varicella immunization.** / Consult your caregiver.   Meningococcal immunization.** / Consult your caregiver.   Hepatitis A immunization.** / Consult your caregiver. 2 doses, 6 to 18 months apart.   Hepatitis B immunization.** / Check with your caregiver. 3 doses, usually over 6 months.  ** Family history and personal history of risk and conditions may change your caregiver's  recommendations. Document Released: 04/28/2001 Document Revised: 02/19/2011 Document Reviewed: 07/28/2010 [facility] Patient Information 2012 [place], [place].Hypertension As your heart beats, it forces blood through your arteries. This force is your blood pressure. If the pressure is too high, it is called hypertension (HTN) or high blood pressure. HTN is dangerous because you may have it and not know it. High blood pressure may mean that your heart has to work harder to pump blood. Your arteries may be narrow or stiff. The extra work puts you at risk for heart disease, stroke, and other problems.  Blood pressure consists of two numbers, a higher number over a lower, 110/72, for example. It is stated as "110 over 72." The ideal is below 120 for the top number (systolic) and under 80 for the bottom (diastolic). Write down your blood pressure today. You should pay close attention to your blood pressure if you have certain conditions such as:  Heart failure.   Prior heart attack.   Diabetes   Chronic kidney disease.   Prior stroke.   Multiple risk factors for heart disease.  To see if you have HTN, your blood pressure should be measured while you are seated with your arm held at the level of the heart. It should be measured at least twice. A one-time elevated blood pressure reading (especially in the Emergency Department) does not mean that you need treatment. There may be conditions in which the blood pressure is different between your right and left arms. It is important to see your caregiver soon for a recheck. Most people have essential hypertension which means that there is not a specific cause. This type of high blood pressure may be lowered by changing lifestyle factors such as:  Stress.   Smoking.   Lack of exercise.   Excessive weight.   Drug/tobacco/alcohol use.   Eating less salt.  Most people do not have symptoms from high blood pressure until it has caused damage to the body.  Effective treatment can often prevent, delay or reduce that damage. TREATMENT  When a cause has been identified, treatment for high blood pressure is directed at the cause. There are a large number of medications to treat HTN. These fall into several categories, and your caregiver will help you select the medicines that are best for you. Medications may have side effects. You should review side effects with your caregiver. If your blood pressure  stays high after you have made lifestyle changes or started on medicines,   Your medication(s) may need to be changed.   Other problems may need to be addressed.   Be certain you understand your prescriptions, and know how and when to take your medicine.   Be sure to follow up with your caregiver within the time frame advised (usually within two weeks) to have your blood pressure rechecked and to review your medications.   If you are taking more than one medicine to lower your blood pressure, make sure you know how and at what times they should be taken. Taking two medicines at the same time can result in blood pressure that is too low.  SEEK IMMEDIATE MEDICAL CARE IF:  You develop a severe headache, blurred or changing vision, or confusion.   You have unusual weakness or numbness, or a faint feeling.   You have severe chest or abdominal pain, vomiting, or breathing problems.  MAKE SURE YOU:   Understand these instructions.   Will watch your condition.   Will get help right away if you are not doing well or get worse.  Document Released: 03/02/2005 Document Revised: 02/19/2011 Document Reviewed: 10/21/2007 [facility] Patient Information 2012 [place], [place].

## 2011-09-24 NOTE — Assessment & Plan Note (Signed)
GYN referral 

## 2011-09-24 NOTE — Assessment & Plan Note (Signed)
Her BP is not well controlled so I have asked her to start Bystolic

## 2011-09-24 NOTE — Assessment & Plan Note (Signed)
I will check her labs today to see if she has any evidence of an inflammatory arthritis

## 2011-09-24 NOTE — Assessment & Plan Note (Signed)
I will recheck her K+ level today 

## 2011-09-24 NOTE — Assessment & Plan Note (Signed)
She does no tolerate oral iron so I may send her to hematology to consider an iron infusion, today I will check her CBC and vitamin levels

## 2011-09-24 NOTE — Progress Notes (Signed)
Subjective:    Patient ID: Lindsay Gomez, female    DOB: May 02, 1970, 41 y.o.   MRN: [id number]  Anemia Presents for follow-up visit. Symptoms include malaise/fatigue and pica. There has been no abdominal pain, anorexia, bruising/bleeding easily, confusion, fever, leg swelling, light-headedness, pallor, palpitations, paresthesias or weight loss. Signs of blood loss that are present include menorrhagia. Signs of blood loss that are not present include hematemesis, hematochezia and melena. Compliance problems include medication side effects.  Compliance with medications is 0-25%. Side effects of medications include GI discomfort.  Arthritis Presents for follow-up visit. She complains of pain. She reports no stiffness, joint swelling or joint warmth. Affected locations include the left wrist, right wrist, right ankle and left ankle. Her pain is at a severity of 3/10. Associated symptoms include fatigue. Pertinent negatives include no diarrhea, dry eyes, dry mouth, dysuria, fever, pain at night, pain while resting, rash, Raynaud's syndrome, uveitis or weight loss.      Review of Systems  Constitutional: Positive for malaise/fatigue and fatigue. Negative for fever, chills, weight loss, diaphoresis, activity change, appetite change and unexpected weight change.  HENT: Negative.   Eyes: Negative.   Respiratory: Negative for cough, chest tightness, shortness of breath, wheezing and stridor.   Cardiovascular: Negative for chest pain, palpitations and leg swelling.  Gastrointestinal: Negative for nausea, vomiting, abdominal pain, diarrhea, constipation, blood in stool, melena, hematochezia, anal bleeding, rectal pain, anorexia and hematemesis.  Genitourinary: Positive for menorrhagia. Negative for dysuria, urgency, frequency, hematuria, flank pain, decreased urine volume, enuresis, difficulty urinating and dyspareunia.  Musculoskeletal: Positive for arthralgias and arthritis. Negative for myalgias, back  pain, joint swelling, gait problem and stiffness.  Skin: Negative for color change, pallor, rash and wound.  Neurological: Negative for dizziness, tremors, seizures, syncope, facial asymmetry, speech difficulty, weakness, light-headedness, numbness, headaches and paresthesias.  Hematological: Does not bruise/bleed easily.  Psychiatric/Behavioral: Negative.  Negative for confusion.       Objective:   Physical Exam  Vitals reviewed. Constitutional: She is oriented to person, place, and time. She appears well-developed and well-nourished. No distress.  HENT:  Head: Normocephalic and atraumatic.  Mouth/Throat: Oropharynx is clear and moist. No oropharyngeal exudate.  Eyes: Conjunctivae are normal. Right eye exhibits no discharge. Left eye exhibits no discharge. No scleral icterus.  Neck: Normal range of motion. Neck supple. No JVD present. No tracheal deviation present. No thyromegaly present.  Cardiovascular: Normal rate, regular rhythm, normal heart sounds and intact distal pulses.  Exam reveals no gallop and no friction rub.   No murmur heard. Pulmonary/Chest: Effort normal and breath sounds normal. No stridor. No respiratory distress. She has no wheezes. She has no rales. She exhibits no tenderness.  Abdominal: Soft. Bowel sounds are normal. She exhibits no distension and no mass. There is no tenderness. There is no rebound and no guarding.  Musculoskeletal: Normal range of motion. She exhibits no edema and no tenderness.       Right wrist: Normal.       Left wrist: Normal.       Right ankle: Normal.       Left ankle: Normal.  Lymphadenopathy:    She has no cervical adenopathy.  Neurological: She is oriented to person, place, and time.  Skin: Skin is warm and dry. No rash noted. She is not diaphoretic. No erythema. No pallor.  Psychiatric: She has a normal mood and affect. Her behavior is normal. Judgment and thought content normal.      Lab Results  Component Value  Date   WBC  7.3 11/12/2010   HGB 8.1* 11/12/2010   HCT 27.3* 11/12/2010   PLT 479.0* 11/12/2010   GLUCOSE 97 11/12/2010   ALT 27 07/04/2010   AST 33 07/04/2010   NA 140 11/12/2010   K 3.4* 11/12/2010   CL 102 11/12/2010   CREATININE 0.8 11/12/2010   BUN 10 11/12/2010   CO2 32 11/12/2010   TSH 0.55 07/04/2010   HGBA1C 6.2 07/04/2010      Assessment & Plan:

## 2011-09-25 ENCOUNTER — Encounter: Payer: Self-pay | Admitting: Internal Medicine

## 2011-09-25 LAB — RHEUMATOID FACTOR: Rhuematoid fact SerPl-aCnc: <10 IU/mL (ref <=14)

## 2011-09-25 LAB — ANTI-NUCLEAR AB-TITER (ANA TITER): ANA Titer 1: 1:80 {titer} — ABNORMAL HIGH

## 2011-09-25 LAB — ANA: Anti Nuclear Antibody(ANA): POSITIVE — AB

## 2011-10-21 ENCOUNTER — Other Ambulatory Visit: Payer: Self-pay | Admitting: Obstetrics & Gynecology

## 2011-10-21 DIAGNOSIS — D259 Leiomyoma of uterus, unspecified: Secondary | ICD-10-CM

## 2011-10-27 ENCOUNTER — Inpatient Hospital Stay
Admission: RE | Admit: 2011-10-27 | Discharge: 2011-10-27 | Payer: BC Managed Care – PPO | Source: Ambulatory Visit | Attending: Obstetrics & Gynecology | Admitting: Obstetrics & Gynecology

## 2011-11-05 ENCOUNTER — Inpatient Hospital Stay: Admission: RE | Admit: 2011-11-05 | Payer: BC Managed Care – PPO | Source: Ambulatory Visit

## 2011-12-07 ENCOUNTER — Telehealth: Payer: Self-pay | Admitting: Internal Medicine

## 2011-12-07 ENCOUNTER — Encounter (HOSPITAL_COMMUNITY): Payer: Self-pay

## 2011-12-07 ENCOUNTER — Emergency Department (HOSPITAL_COMMUNITY): Payer: BC Managed Care – PPO

## 2011-12-07 ENCOUNTER — Emergency Department (HOSPITAL_COMMUNITY)
Admission: EM | Admit: 2011-12-07 | Discharge: 2011-12-07 | Disposition: A | Discharge status: Home / Self Care | Payer: BC Managed Care – PPO | Attending: Emergency Medicine | Admitting: Emergency Medicine

## 2011-12-07 DIAGNOSIS — I10 Essential (primary) hypertension: Secondary | ICD-10-CM | POA: Insufficient documentation

## 2011-12-07 DIAGNOSIS — R799 Abnormal finding of blood chemistry, unspecified: Secondary | ICD-10-CM | POA: Insufficient documentation

## 2011-12-07 DIAGNOSIS — R0789 Other chest pain: Secondary | ICD-10-CM | POA: Insufficient documentation

## 2011-12-07 DIAGNOSIS — R7989 Other specified abnormal findings of blood chemistry: Secondary | ICD-10-CM

## 2011-12-07 DIAGNOSIS — R0609 Other forms of dyspnea: Secondary | ICD-10-CM | POA: Insufficient documentation

## 2011-12-07 DIAGNOSIS — R0602 Shortness of breath: Secondary | ICD-10-CM | POA: Insufficient documentation

## 2011-12-07 DIAGNOSIS — R0600 Dyspnea, unspecified: Secondary | ICD-10-CM

## 2011-12-07 DIAGNOSIS — R0989 Other specified symptoms and signs involving the circulatory and respiratory systems: Secondary | ICD-10-CM | POA: Insufficient documentation

## 2011-12-07 LAB — POCT I-STAT TROPONIN I: Troponin i, poc: 0 ng/mL (ref 0.00–0.08)

## 2011-12-07 LAB — CBC
HCT: 31.1 % — ABNORMAL LOW (ref 36.0–46.0)
Hemoglobin: 8.2 g/dL — ABNORMAL LOW (ref 12.0–15.0)
MCH: 17.2 pg — ABNORMAL LOW (ref 26.0–34.0)
MCHC: 26.4 g/dL — ABNORMAL LOW (ref 30.0–36.0)
MCV: 65.2 fL — ABNORMAL LOW (ref 78.0–100.0)
Platelets: 357 10*3/uL (ref 150–400)
RBC: 4.77 MIL/uL (ref 3.87–5.11)
RDW: 19.5 % — ABNORMAL HIGH (ref 11.5–15.5)
WBC: 7.9 10*3/uL (ref 4.0–10.5)

## 2011-12-07 LAB — BASIC METABOLIC PANEL
BUN: 8 mg/dL (ref 6–23)
CO2: 26 mEq/L (ref 19–32)
Calcium: 9.3 mg/dL (ref 8.4–10.5)
Chloride: 103 mEq/L (ref 96–112)
Creatinine, Ser: 0.67 mg/dL (ref 0.50–1.10)
GFR calc Af Amer: >90 mL/min (ref >90)
GFR calc non Af Amer: >90 mL/min (ref >90)
Glucose, Bld: 115 mg/dL — ABNORMAL HIGH (ref 70–99)
Potassium: 3.4 mEq/L — ABNORMAL LOW (ref 3.5–5.1)
Sodium: 138 mEq/L (ref 135–145)

## 2011-12-07 LAB — D-DIMER, QUANTITATIVE: D-Dimer, Quant: 0.8 ug/mL-FEU — ABNORMAL HIGH (ref 0.00–0.48)

## 2011-12-07 LAB — PRO B NATRIURETIC PEPTIDE: Pro B Natriuretic peptide (BNP): 511.9 pg/mL — ABNORMAL HIGH (ref 0–125)

## 2011-12-07 MED ORDER — ASPIRIN 81 MG PO CHEW
324.0000 mg | CHEWABLE_TABLET | Freq: Once | ORAL | Status: AC
  Administered 2011-12-07: 324 mg via ORAL
  Filled 2011-12-07: qty 4

## 2011-12-07 MED ORDER — IOHEXOL 350 MG/ML SOLN
100.0000 mL | Freq: Once | INTRAVENOUS | Status: AC | PRN
  Administered 2011-12-07: 85 mL via INTRAVENOUS

## 2011-12-07 NOTE — ED Notes (Signed)
Re-eval- Pt alert, chest pain decreased to 2

## 2011-12-07 NOTE — ED Notes (Signed)
Reports uncomfortable to breath x 1-2 weeks and chest feeling heavy x 2 days. Sob worsening today with dry cough. Sat 100% on RA. No N/V. Pt alert, oriented,

## 2011-12-07 NOTE — ED Notes (Signed)
Patient transported to CT 

## 2011-12-07 NOTE — ED Notes (Signed)
Patient refused saline lock placement.

## 2011-12-07 NOTE — ED Notes (Signed)
Pt stated that she has been Shob "for a while, more than few days". Pt states,  "i have been here since 2pm today and was told up in triage that I need to have IV access and CT done to rule out PE".  PT NAD at this time. Waiting for CT to get pt.

## 2011-12-07 NOTE — Telephone Encounter (Signed)
°  Caller: [name]/Patient; Patient Name: Lindsay Gomez; PCP: Sanda Linger (Adults only); Best Callback Phone Number: [phone]; Reason for call: she has been having SOB with exertion with chest pressure.  Symptoms started 2 weeks ago,  since about 11/23/11.   Triaged Chest Pain and having chest pain going to her back right now and not associated with taking a deep breath...  Instructed needs to call [phone]

## 2011-12-07 NOTE — ED Provider Notes (Signed)
History     CSN: [id number]  Arrival date & time 12/07/11  1354   First MD Initiated Contact with Patient 12/07/11 1517      Chief Complaint  Patient presents with  . Chest Pain  . Shortness of Breath    (Consider location/radiation/quality/duration/timing/severity/associated sxs/prior treatment) HPI Comments: Lindsay Gomez 41 y.o. female   The chief complaint is: Patient presents with:   Chest Pain   Shortness of Breath    41 year old female with a history of hypertension, and chronic iron deficiency anemia. Presents today with chief complaint of left-sided chest pain. States that the chest pain. Has been going on for about 3 weeks. Approximately. She states that the pain is like a tightness or a pressure. It does not radiate to her left arm or jaw. It does radiate to her back. Back. She denies any diaphoresis, nausea or vomiting. She does not have a known history of reactive airway disease. She denies any upper respiratory symptoms, as of late. She does state that her pain is constant. She rates it at about a 3 or 4 out of out of 10. She states that yesterday at work. Pain was about an 8 or 9/10. Was not worsened with activity. States that last night. She was unable to find comfortable position to sleep. Worse when lying flat. She has a cough as well. That is worse at night. There She felt better when lying on her stomach. As the pain increased yesterday and last night. Patient decided to come in for evaluation. He has not been taking her medications for hypertension for the past 2 years as  she states that her blood pressure normally runs around 120/80. She denies sudden onset acute chest pain. She denies, process. She denies any exogenous estrogens or history of smoking, however, she states that she does lay in her bed. Most of the time. Due to her chronic fatigue from iron deficiency anemia. She also has a pica for starch. She is not taking iron currently due to her difficulties  with constipation. Denies abdominal pain.Denies fevers, chills, myalgias, arthralgias, nausea, vomiting, diarrhea.    Patient is a 41 y.o. female presenting with chest pain and shortness of breath. The history is provided by the patient. No language interpreter was used.  Chest Pain Primary symptoms include fatigue and shortness of breath. Pertinent negatives for primary symptoms include no fever, no cough, no wheezing, no palpitations, no abdominal pain, no nausea, no vomiting and no dizziness.  Pertinent negatives for associated symptoms include no diaphoresis.    Shortness of Breath  Associated symptoms include chest pain and shortness of breath. Pertinent negatives include no fever, no cough and no wheezing.    Past Medical History  Diagnosis Date  . Anemia   . Hypertension   . Allergy     rhinitis  . Morbid obesity     Past Surgical History  Procedure Date  . Knee surgery 1992    ? arthroscopic/ right    Family History  Problem Relation Age of Onset  . Hypertension Mother   . Coronary artery disease Father   . Heart failure Father   . Heart attack Father   . Hypertension Other   . Hyperlipidemia Other     History  Substance Use Topics  . Smoking status: Never Smoker   . Smokeless tobacco: Not on file  . Alcohol Use: No    OB History    Grav Para Term Preterm Abortions TAB SAB Ect Mult  Living                  Review of Systems  Constitutional: Positive for fatigue. Negative for fever, chills and diaphoresis.  HENT: Negative for ear pain, neck stiffness and sinus pressure.   Respiratory: Positive for chest tightness and shortness of breath. Negative for cough and wheezing.   Cardiovascular: Positive for chest pain. Negative for palpitations and leg swelling.  Gastrointestinal: Negative for nausea, vomiting, abdominal pain, diarrhea and constipation.  Genitourinary: Negative for dysuria, hematuria and flank pain.  Musculoskeletal: Negative for myalgias,  joint swelling, arthralgias and gait problem.  Skin: Negative for rash.  Neurological: Negative for dizziness and light-headedness.    Allergies  Lisinopril  Home Medications   Current Outpatient Rx  Name Route Sig Dispense Refill  . FLAX SEED OIL 1000 MG PO CAPS Oral Take 1 capsule by mouth daily.      Marland Kitchen POTASSIUM CHLORIDE ER 10 MEQ PO TBCR Oral Take 10 mEq by mouth 2 (two) times daily.      BP 167/99  Pulse 97  Temp 98.3 F (36.8 C) (Oral)  Resp 18  SpO2 100%  LMP 11/09/2011  Physical Exam  Nursing note and vitals reviewed. Constitutional: She is oriented to person, place, and time. She appears well-developed and well-nourished. No distress.  HENT:  Head: Normocephalic and atraumatic.  Eyes: Conjunctivae normal are normal. No scleral icterus.  Neck: Normal range of motion. No JVD present. No thyromegaly present.  Cardiovascular: Normal rate, regular rhythm, normal heart sounds and intact distal pulses.  Exam reveals no gallop and no friction rub.   No murmur heard. Pulmonary/Chest: Effort normal and breath sounds normal. No respiratory distress. She has no wheezes. She exhibits no tenderness.  Abdominal: Soft. Bowel sounds are normal. She exhibits no distension and no mass. There is no tenderness. There is no guarding.  Musculoskeletal: Normal range of motion. She exhibits no edema and no tenderness.  Lymphadenopathy:    She has no cervical adenopathy.  Neurological: She is alert and oriented to person, place, and time.  Skin: Skin is warm and dry. She is not diaphoretic. There is pallor (conjunctiva).  Psychiatric: She has a normal mood and affect. Her behavior is normal.    ED Course  Procedures (including critical care time)  Results for orders placed during the hospital encounter of 12/07/11  CBC      Component Value Range   WBC 7.9  4.0 - 10.5 K/uL   RBC 4.77  3.87 - 5.11 MIL/uL   Hemoglobin 8.2 (*) 12.0 - 15.0 g/dL   HCT 16.1 (*) 09.6 - 04.5 %   MCV 65.2  (*) 78.0 - 100.0 fL   MCH 17.2 (*) 26.0 - 34.0 pg   MCHC 26.4 (*) 30.0 - 36.0 g/dL   RDW 40.9 (*) 81.1 - 91.4 %   Platelets 357  150 - 400 K/uL  BASIC METABOLIC PANEL      Component Value Range   Sodium 138  135 - 145 mEq/L   Potassium 3.4 (*) 3.5 - 5.1 mEq/L   Chloride 103  96 - 112 mEq/L   CO2 26  19 - 32 mEq/L   Glucose, Bld 115 (*) 70 - 99 mg/dL   BUN 8  6 - 23 mg/dL   Creatinine, Ser 7.82  0.50 - 1.10 mg/dL   Calcium 9.3  8.4 - 95.6 mg/dL   GFR calc non Af Amer >90  >90 mL/min   GFR calc Af  Amer >90  >90 mL/min  POCT I-STAT TROPONIN I      Component Value Range   Troponin i, poc 0.00  0.00 - 0.08 ng/mL   Comment 3           D-DIMER, QUANTITATIVE      Component Value Range   D-Dimer, Quant 0.80 (*) 0.00 - 0.48 ug/mL-FEU  PRO B NATRIURETIC PEPTIDE      Component Value Range   Pro B Natriuretic peptide (BNP) 511.9 (*) 0 - 125 pg/mL    Dg Chest 2 View  12/07/2011  *RADIOLOGY REPORT*  Clinical Data: Left-sided chest pain, shortness of breath.  CHEST - 2 VIEW  Comparison: None.  Findings: Cardiomegaly.  No confluent opacities or effusions.  No overt edema.  No acute bony abnormality.  IMPRESSION: Cardiomegaly.  No active disease.   Original Report Authenticated By: Cyndie Chime, M.D.      1. Dyspnea   2. Elevated brain natriuretic peptide (BNP) level     Patient with history of patella, low potassium. She is on chronic potassium replacement. She also has history of low hemoglobin. An iron deficiency anemia. She states that her regular physician has told her that she may have to have an iron infusion with hematology. First troponin at 2:30 is negative. EKG results screened by physician. Show normal. EKG with left ventricular hypertrophy. She does have slightly low potassium. Glucose is slightly elevated today.  MDM  X-ray is negative. The do not suspect cardiac etiology.  Although her symptoms do not correlate with pulmonary, embolism. I'm ordering a d-dimer as she has a very  sedentary lifestyle due to her fatigue. Also morbidly obese. Suspect that this chronic pain is likely due to some undiagnosed reactive airway disease, although I did not hear any any wheezes. On auscultation. Also, like to rule out heart failure, as she may have a high output heart failure due to her chronic anemia, as well as her hypertension. Order d-dimer and BNP. Awaiting results at this time  D-dimer is elevated and patient will need f/u chest CTA.  She is going to move to acute care side.  Dr. Jeraldine Loots has agreed to assume care.      Arthor Captain, PA-C 12/09/11 2120

## 2011-12-08 ENCOUNTER — Ambulatory Visit (INDEPENDENT_AMBULATORY_CARE_PROVIDER_SITE_OTHER): Payer: BC Managed Care – PPO | Admitting: Internal Medicine

## 2011-12-08 ENCOUNTER — Telehealth: Payer: Self-pay | Admitting: Oncology

## 2011-12-08 ENCOUNTER — Encounter: Payer: Self-pay | Admitting: Internal Medicine

## 2011-12-08 VITALS — BP 120/84 | HR 104 | Temp 98.2°F | Resp 16 | Wt 273.0 lb

## 2011-12-08 DIAGNOSIS — D509 Iron deficiency anemia, unspecified: Secondary | ICD-10-CM

## 2011-12-08 DIAGNOSIS — I517 Cardiomegaly: Secondary | ICD-10-CM

## 2011-12-08 DIAGNOSIS — N920 Excessive and frequent menstruation with regular cycle: Secondary | ICD-10-CM

## 2011-12-08 DIAGNOSIS — IMO0001 Reserved for inherently not codable concepts without codable children: Secondary | ICD-10-CM

## 2011-12-08 DIAGNOSIS — I10 Essential (primary) hypertension: Secondary | ICD-10-CM

## 2011-12-08 MED ORDER — FERROUS GLUCONATE 325 MG PO TABS: 325.0000 mg | ORAL_TABLET | Freq: Two times a day (BID) | ORAL | Status: DC

## 2011-12-08 MED ORDER — FERRAPLUS 90 90-1 MG PO TABS: 1.0000 | ORAL_TABLET | Freq: Every day | ORAL | Status: DC

## 2011-12-08 NOTE — Assessment & Plan Note (Signed)
Her BNP was high in the ER yesterday so I have asked her to see cardiology to be evaluated for CHF

## 2011-12-08 NOTE — Progress Notes (Signed)
Subjective:    Patient ID: Lindsay Gomez, female    DOB: Jan 10, 1971, 41 y.o.   MRN: [id number]  Hypertension This is a chronic problem. The current episode started more than 1 year ago. The problem is unchanged. The problem is controlled. Associated symptoms include chest pain (diffuse pressure), malaise/fatigue and shortness of breath. Pertinent negatives include no anxiety, blurred vision, headaches, palpitations, peripheral edema, PND or sweats. There are no associated agents to hypertension. Past treatments include nothing. The current treatment provides moderate improvement. Compliance problems include exercise, diet and psychosocial issues.  Hypertensive end-organ damage includes heart failure and left ventricular hypertrophy.  Anemia Presents for follow-up visit. Symptoms include malaise/fatigue. There has been no abdominal pain, anorexia, bruising/bleeding easily, confusion, fever, leg swelling, light-headedness, pallor, palpitations, paresthesias, pica or weight loss. Signs of blood loss that are present include menorrhagia. Signs of blood loss that are not present include hematemesis, hematochezia and melena. Past medical history includes heart failure. Compliance problems include medication side effects.  Compliance with medications is 0-25%. Side effects of medications include GI discomfort.      Review of Systems  Constitutional: Positive for malaise/fatigue and fatigue. Negative for fever, chills, weight loss, diaphoresis, activity change, appetite change and unexpected weight change.  HENT: Negative.   Eyes: Negative.  Negative for blurred vision.  Respiratory: Positive for shortness of breath. Negative for apnea, cough, choking, chest tightness, wheezing and stridor.   Cardiovascular: Positive for chest pain (diffuse pressure). Negative for palpitations, leg swelling and PND.  Gastrointestinal: Negative.  Negative for abdominal pain, melena, hematochezia, anorexia and  hematemesis.  Genitourinary: Negative.  Positive for menorrhagia.  Musculoskeletal: Negative.   Skin: Negative.  Negative for pallor.  Neurological: Negative for dizziness, tremors, seizures, syncope, facial asymmetry, speech difficulty, weakness, light-headedness, numbness, headaches and paresthesias.  Hematological: Negative for adenopathy. Does not bruise/bleed easily.  Psychiatric/Behavioral: Negative for confusion.       Objective:   Physical Exam  Vitals reviewed. Constitutional: She is oriented to person, place, and time. She appears well-developed and well-nourished. No distress.  HENT:  Head: Normocephalic and atraumatic.  Mouth/Throat: Oropharynx is clear and moist. No oropharyngeal exudate.  Eyes: Conjunctivae normal are normal. Right eye exhibits no discharge. Left eye exhibits no discharge. No scleral icterus.  Neck: Normal range of motion. Neck supple. No JVD present. No tracheal deviation present. No thyromegaly present.  Cardiovascular: Normal rate, regular rhythm, normal heart sounds and intact distal pulses.  Exam reveals no gallop and no friction rub.   No murmur heard. Pulmonary/Chest: Effort normal and breath sounds normal. No stridor. No respiratory distress. She has no wheezes. She has no rales. She exhibits no tenderness.  Abdominal: Soft. Bowel sounds are normal. She exhibits no distension and no mass. There is no tenderness. There is no rebound and no guarding.  Musculoskeletal: Normal range of motion. She exhibits edema (trace edema in BLE). She exhibits no tenderness.  Lymphadenopathy:    She has no cervical adenopathy.  Neurological: She is alert and oriented to person, place, and time. She has normal reflexes. She displays normal reflexes. No cranial nerve deficit. She exhibits normal muscle tone. Coordination normal.  Skin: Skin is warm and dry. No rash noted. She is not diaphoretic. No erythema. No pallor.  Psychiatric: She has a normal mood and affect. Her  behavior is normal. Judgment and thought content normal.     Lab Results  Component Value Date   WBC 7.9 12/07/2011   HGB 8.2* 12/07/2011  HCT 31.1* 12/07/2011   PLT 357 12/07/2011   GLUCOSE 115* 12/07/2011   CHOL 205* 09/24/2011   TRIG 87.0 09/24/2011   HDL 43.50 09/24/2011   LDLDIRECT 140.4 09/24/2011   ALT 38* 09/24/2011   AST 51* 09/24/2011   NA 138 12/07/2011   K 3.4* 12/07/2011   CL 103 12/07/2011   CREATININE 0.67 12/07/2011   BUN 8 12/07/2011   CO2 26 12/07/2011   TSH 1.25 09/24/2011   HGBA1C 6.5 09/24/2011  Dg Chest 2 View  12/07/2011  *RADIOLOGY REPORT*  Clinical Data: Left-sided chest pain, shortness of breath.  CHEST - 2 VIEW  Comparison: None.  Findings: Cardiomegaly.  No confluent opacities or effusions.  No overt edema.  No acute bony abnormality.  IMPRESSION: Cardiomegaly.  No active disease.   Original Report Authenticated By: Cyndie Chime, M.D.    Ct Angio Chest Pe W/cm &/or Wo Cm  12/07/2011  *RADIOLOGY REPORT*  Clinical Data: Shortness of breath for several days.  CT ANGIOGRAPHY CHEST  Technique:  Multidetector CT imaging of the chest using the standard protocol during bolus administration of intravenous contrast. Multiplanar reconstructed images including MIPs were obtained and reviewed to evaluate the vascular anatomy.  Contrast: 85mL OMNIPAQUE IOHEXOL 350 MG/ML SOLN  Comparison: Chest radiograph 12/07/2011  Findings: The contrast bolus is not optimal but there is no evidence to suggest an acute pulmonary embolism.  Heart size is slightly enlarged.  No significant pericardial or pleural fluid. Limited evaluation of the upper abdominal structures.  Small lymph nodes in the mediastinum are nonspecific.  There may be a small nodule along the inferior right thyroid lobe that measures up to 1.3 cm.  The trachea and mainstem bronchi are patent.  Lungs are clear with exception of a few peripheral densities in the lower lungs bilaterally.  Focal pleural based density in the right lower  lobe on sequence 11, image 70 measures up to 9 cm and this was not present on the abdominal CT from 08/22/2007.   However, the patient had similar pleural-based densities on the previous abdominal CT which are no longer present and most likely represented atelectasis and volume loss.  There is no focal parenchymal lung disease. Incidentally, there is a hemangioma along the right side of the T8 vertebral body.  IMPRESSION: No evidence for a pulmonary embolism.  Patchy peripheral densities in the lower lungs bilaterally. Findings most likely represent areas of atelectasis and volume loss.  If the patient is high risk for lung cancer, consider a follow-up CT.  Cannot exclude a nodule involving the inferior right thyroid lobe. This could be further evaluated with a thyroid ultrasound.   Original Report Authenticated By: Richarda Overlie, M.D.        Assessment & Plan:

## 2011-12-08 NOTE — Telephone Encounter (Signed)
S/W PT IN REF. TO NP APPT. 12/21/11 @ 3:00 REFERRING DR. Yetta Barre DX-IDA  MAILED NP PACKET

## 2011-12-08 NOTE — Assessment & Plan Note (Signed)
She was recently told that she has uterine fibroids

## 2011-12-08 NOTE — Assessment & Plan Note (Signed)
She has not been taking her BP meds though her BP is normal today, Her EKG yesterday in the ER showed some concern for LVH

## 2011-12-08 NOTE — Assessment & Plan Note (Signed)
She has not tolerated and has not been compliant with oral iron so I have asked her to see hematology to consider an iron infusion

## 2011-12-08 NOTE — Patient Instructions (Signed)

## 2011-12-08 NOTE — Telephone Encounter (Signed)
C/D 12/08/11 for appt. 12/21/11

## 2011-12-10 ENCOUNTER — Ambulatory Visit (INDEPENDENT_AMBULATORY_CARE_PROVIDER_SITE_OTHER): Payer: BC Managed Care – PPO | Admitting: Cardiology

## 2011-12-10 ENCOUNTER — Encounter: Payer: Self-pay | Admitting: Cardiology

## 2011-12-10 VITALS — BP 148/100 | HR 90 | Ht 62.0 in | Wt 271.0 lb

## 2011-12-10 DIAGNOSIS — R079 Chest pain, unspecified: Secondary | ICD-10-CM

## 2011-12-10 DIAGNOSIS — I10 Essential (primary) hypertension: Secondary | ICD-10-CM

## 2011-12-10 DIAGNOSIS — R0602 Shortness of breath: Secondary | ICD-10-CM

## 2011-12-10 MED ORDER — HYDROCHLOROTHIAZIDE 25 MG PO TABS: 25.0000 mg | ORAL_TABLET | Freq: Every day | ORAL | Status: DC

## 2011-12-10 NOTE — ED Provider Notes (Signed)
Medical screening examination/treatment/procedure(s) were conducted as a shared visit with non-physician practitioner(s) and myself.  I personally evaluated the patient during the encounter On my exam the patient was in no distress.  Following return of CT I discussed all findings with the patient and colleagues.  She was d/c w explicit f.u instructions, emphasizing the need for additional evaluation of today's findings.  Gerhard Munch, MD 12/10/11 1520

## 2011-12-10 NOTE — Patient Instructions (Addendum)
Start HCTZ (hydrochlorothiazide) 25mg  daily.  Take KCL (potassium) 10 mEq two times a day.  Your physician has requested that you have an echocardiogram. Echocardiography is a painless test that uses sound waves to create images of your heart. It provides your doctor with information about the size and shape of your heart and how well your heart's chambers and valves are working. This procedure takes approximately one hour. There are no restrictions for this procedure.  Your physician recommends that you return for lab work in: 2 weeks with Dr Frutoso Chase.   Your physician recommends that you schedule a follow-up appointment in: 2-3 weeks with Dr Shirlee Latch

## 2011-12-12 DIAGNOSIS — R079 Chest pain, unspecified: Secondary | ICD-10-CM | POA: Insufficient documentation

## 2011-12-12 NOTE — Progress Notes (Signed)
Patient ID: Lindsay Gomez, female   DOB: 1970-11-08, 41 y.o.   MRN: [id number] PCP: Dr. Yetta Barre  41 yo with history of HTN presents for evaluation of chest pain.  Patient has had 3 weeks of constant chest pain.  It has been left-sided.  It is worse with sitting up and better with lying down.  It is not pleuritic.  It is not worse with exertion.  She has had no cough or fever. She went to the ER on 9/23 because the pain had become worse.  She had a chest CTA that showed no PE or PNA.  Troponin was negative. The pain is actually mild now.  It is just nagging.  At baseline, she is short of breath walking up hillls or steps.  No orthopnea.  No problems walking on flat ground.  Sometimes her legs will swell when she is on her feet all day.   ECG: NSR, normal  Labs (9/13): K 3.4, creatinine 0.67, BNP 512, HCT 30.4, TnI 0  PMH: 1. HTN 2. Anemia: Fe-deficiency 3. Obesity  SH:  Midwife, nonsmoker.   FH: Father with CHF and MI in his late 40s.  Mother with HTN.  ROS: All systems reviewed and negative except as per HPI.   Current Outpatient Prescriptions  Medication Sig Dispense Refill  . Flaxseed, Linseed, (FLAX SEED OIL) 1000 MG CAPS Take 1 capsule by mouth daily.        . Iron-Folic Acid-B12-C-Docusate (FERRAPLUS 90) 90-1 MG TABS Take 1 tablet by mouth daily.  90 tablet  3  . potassium chloride (K-DUR) 10 MEQ tablet Take 10 mEq by mouth 2 (two) times daily.      . hydrochlorothiazide (HYDRODIURIL) 25 MG tablet Take 1 tablet (25 mg total) by mouth daily.  30 tablet  3    BP 148/100  Pulse 90  Ht 5\' 2"  (1.575 m)  Wt 271 lb (122.925 kg)  BMI 49.57 kg/m2  LMP 11/09/2011 General: NAD, obese Neck: No JVD, no thyromegaly or thyroid nodule.  Lungs: Clear to auscultation bilaterally with normal respiratory effort. CV: Nondisplaced PMI.  Heart regular S1/S2, no S3/S4, no murmur.  No peripheral edema.  No carotid bruit.  Normal pedal pulses.  Abdomen: Soft, nontender, no  hepatosplenomegaly, no distention.  Skin: Intact without lesions or rashes.  Neurologic: Alert and oriented x 3.  Psych: Normal affect. Extremities: No clubbing or cyanosis.  HEENT: Normal.  MSK: Tender to palpation over left mid chest.   Assessment/Plan: 1. Chest pain: Atypical.  Constant x 3 wks, now improved.  ER workup with unremarkable CTA chest.  Troponin normal.  ECG normal.  Chest wall is tender.  This may represent costochondritis.  Less likely acute pericarditis.  I will have her get an echo.  No other workup as symptoms are improving on their own.   2. HTN: BP is running high.  Start HCTZ 25 mg daily with KCl 10 mEq bid.  BMET in 2 wks.    Claudine Stallings [employer]

## 2011-12-15 ENCOUNTER — Ambulatory Visit (HOSPITAL_COMMUNITY): Payer: BC Managed Care – PPO | Attending: Cardiology | Admitting: Radiology

## 2011-12-15 DIAGNOSIS — R0602 Shortness of breath: Secondary | ICD-10-CM

## 2011-12-15 DIAGNOSIS — I059 Rheumatic mitral valve disease, unspecified: Secondary | ICD-10-CM | POA: Insufficient documentation

## 2011-12-15 DIAGNOSIS — I10 Essential (primary) hypertension: Secondary | ICD-10-CM | POA: Insufficient documentation

## 2011-12-15 DIAGNOSIS — R072 Precordial pain: Secondary | ICD-10-CM

## 2011-12-15 DIAGNOSIS — Z8249 Family history of ischemic heart disease and other diseases of the circulatory system: Secondary | ICD-10-CM | POA: Insufficient documentation

## 2011-12-15 DIAGNOSIS — R079 Chest pain, unspecified: Secondary | ICD-10-CM

## 2011-12-15 DIAGNOSIS — I379 Nonrheumatic pulmonary valve disorder, unspecified: Secondary | ICD-10-CM | POA: Insufficient documentation

## 2011-12-15 DIAGNOSIS — I369 Nonrheumatic tricuspid valve disorder, unspecified: Secondary | ICD-10-CM | POA: Insufficient documentation

## 2011-12-15 NOTE — Progress Notes (Signed)
Echocardiogram performed.  

## 2011-12-20 ENCOUNTER — Other Ambulatory Visit: Payer: Self-pay | Admitting: Oncology

## 2011-12-20 DIAGNOSIS — D539 Nutritional anemia, unspecified: Secondary | ICD-10-CM

## 2011-12-20 DIAGNOSIS — D509 Iron deficiency anemia, unspecified: Secondary | ICD-10-CM

## 2011-12-21 ENCOUNTER — Other Ambulatory Visit: Payer: BC Managed Care – PPO | Admitting: Lab

## 2011-12-21 ENCOUNTER — Ambulatory Visit: Payer: BC Managed Care – PPO | Admitting: Oncology

## 2011-12-21 ENCOUNTER — Ambulatory Visit: Payer: BC Managed Care – PPO

## 2011-12-21 NOTE — Progress Notes (Signed)
New patient appointment; was no show.  Reschedule prn.

## 2011-12-28 ENCOUNTER — Other Ambulatory Visit: Payer: BC Managed Care – PPO

## 2011-12-30 ENCOUNTER — Ambulatory Visit: Payer: BC Managed Care – PPO | Admitting: Cardiology

## 2012-02-08 ENCOUNTER — Ambulatory Visit: Payer: BC Managed Care – PPO | Admitting: Internal Medicine

## 2012-02-10 ENCOUNTER — Ambulatory Visit: Payer: BC Managed Care – PPO | Admitting: Internal Medicine

## 2012-02-10 DIAGNOSIS — Z0289 Encounter for other administrative examinations: Secondary | ICD-10-CM

## 2012-07-21 LAB — HM DIABETES EYE EXAM: HM Diabetic Eye Exam: NORMAL

## 2012-09-14 ENCOUNTER — Other Ambulatory Visit (INDEPENDENT_AMBULATORY_CARE_PROVIDER_SITE_OTHER): Payer: BC Managed Care – PPO

## 2012-09-14 ENCOUNTER — Encounter: Payer: Self-pay | Admitting: Internal Medicine

## 2012-09-14 ENCOUNTER — Ambulatory Visit (INDEPENDENT_AMBULATORY_CARE_PROVIDER_SITE_OTHER): Payer: BC Managed Care – PPO | Admitting: Internal Medicine

## 2012-09-14 VITALS — BP 140/90 | HR 94 | Temp 98.2°F | Resp 16 | Ht 62.0 in | Wt 263.0 lb

## 2012-09-14 DIAGNOSIS — M2550 Pain in unspecified joint: Secondary | ICD-10-CM

## 2012-09-14 DIAGNOSIS — R894 Abnormal immunological findings in specimens from other organs, systems and tissues: Secondary | ICD-10-CM

## 2012-09-14 DIAGNOSIS — E876 Hypokalemia: Secondary | ICD-10-CM

## 2012-09-14 DIAGNOSIS — IMO0001 Reserved for inherently not codable concepts without codable children: Secondary | ICD-10-CM

## 2012-09-14 DIAGNOSIS — R7689 Other specified abnormal immunological findings in serum: Secondary | ICD-10-CM

## 2012-09-14 DIAGNOSIS — D509 Iron deficiency anemia, unspecified: Secondary | ICD-10-CM

## 2012-09-14 DIAGNOSIS — I10 Essential (primary) hypertension: Secondary | ICD-10-CM

## 2012-09-14 DIAGNOSIS — I517 Cardiomegaly: Secondary | ICD-10-CM

## 2012-09-14 DIAGNOSIS — R768 Other specified abnormal immunological findings in serum: Secondary | ICD-10-CM

## 2012-09-14 LAB — COMPREHENSIVE METABOLIC PANEL
ALT: 24 U/L (ref 0–35)
AST: 29 U/L (ref 0–37)
Albumin: 3.7 g/dL (ref 3.5–5.2)
Alkaline Phosphatase: 77 U/L (ref 39–117)
BUN: 11 mg/dL (ref 6–23)
CO2: 23 mEq/L (ref 19–32)
Calcium: 9.1 mg/dL (ref 8.4–10.5)
Chloride: 103 mEq/L (ref 96–112)
Creatinine, Ser: 0.6 mg/dL (ref 0.4–1.2)
GFR: 132.14 mL/min (ref >60.00)
Glucose, Bld: 122 mg/dL — ABNORMAL HIGH (ref 70–99)
Potassium: 3.1 mEq/L — ABNORMAL LOW (ref 3.5–5.1)
Sodium: 138 mEq/L (ref 135–145)
Total Bilirubin: 0.4 mg/dL (ref 0.3–1.2)
Total Protein: 8.3 g/dL (ref 6.0–8.3)

## 2012-09-14 LAB — CBC WITH DIFFERENTIAL/PLATELET
Basophils Absolute: 0 10*3/uL (ref 0.0–0.1)
Basophils Relative: 0.4 % (ref 0.0–3.0)
Eosinophils Absolute: 0.1 10*3/uL (ref 0.0–0.7)
Eosinophils Relative: 1.6 % (ref 0.0–5.0)
HCT: 26.6 % — ABNORMAL LOW (ref 36.0–46.0)
Hemoglobin: 7.5 g/dL — CL (ref 12.0–15.0)
Lymphocytes Relative: 22.1 % (ref 12.0–46.0)
Lymphs Abs: 1.6 10*3/uL (ref 0.7–4.0)
MCHC: 28.3 g/dL — ABNORMAL LOW (ref 30.0–36.0)
MCV: 59 fl — ABNORMAL LOW (ref 78.0–100.0)
Monocytes Absolute: 0.2 10*3/uL (ref 0.1–1.0)
Monocytes Relative: 2.8 % — ABNORMAL LOW (ref 3.0–12.0)
Neutro Abs: 5.4 10*3/uL (ref 1.4–7.7)
Neutrophils Relative %: 73.1 % (ref 43.0–77.0)
Platelets: 207 10*3/uL (ref 150.0–400.0)
RBC: 4.51 Mil/uL (ref 3.87–5.11)
RDW: 21.4 % — ABNORMAL HIGH (ref 11.5–14.6)
WBC: 7.4 10*3/uL (ref 4.5–10.5)

## 2012-09-14 LAB — IBC PANEL
Iron: 17 ug/dL — ABNORMAL LOW (ref 42–145)
Saturation Ratios: 3.4 % — ABNORMAL LOW (ref 20.0–50.0)
Transferrin: 355.3 mg/dL (ref 212.0–360.0)

## 2012-09-14 LAB — FERRITIN: Ferritin: 3.9 ng/mL — ABNORMAL LOW (ref 10.0–291.0)

## 2012-09-14 LAB — TSH: TSH: 1.18 u[IU]/mL (ref 0.35–5.50)

## 2012-09-14 LAB — MAGNESIUM: Magnesium: 1.7 mg/dL (ref 1.5–2.5)

## 2012-09-14 LAB — HM DIABETES FOOT EXAM

## 2012-09-14 LAB — HEMOGLOBIN A1C: Hgb A1c MFr Bld: 6.3 % (ref 4.6–6.5)

## 2012-09-14 MED ORDER — LOSARTAN POTASSIUM 100 MG PO TABS: 100.0000 mg | ORAL_TABLET | Freq: Every day | ORAL | Status: DC

## 2012-09-14 NOTE — Assessment & Plan Note (Signed)
She is working on her lifestyle modifications 

## 2012-09-14 NOTE — Assessment & Plan Note (Signed)
I have asked her to start losartan  Her BP goal is <130/80

## 2012-09-14 NOTE — Progress Notes (Signed)
Subjective:    Patient ID: Lindsay Gomez, female    DOB: Aug 05, 1970, 42 y.o.   MRN: [id number]  Hypertension This is a chronic problem. The current episode started more than 1 year ago. The problem is unchanged. The problem is uncontrolled. Associated symptoms include malaise/fatigue. Pertinent negatives include no anxiety, blurred vision, chest pain, headaches, neck pain, orthopnea, palpitations, peripheral edema, PND, shortness of breath or sweats. There are no associated agents to hypertension. Past treatments include diuretics. The current treatment provides mild improvement. Compliance problems include exercise, diet and medication side effects.  Hypertensive end-organ damage includes left ventricular hypertrophy.      Review of Systems  Constitutional: Positive for malaise/fatigue and fatigue. Negative for fever, chills, diaphoresis, activity change, appetite change and unexpected weight change.  HENT: Negative.  Negative for neck pain.   Eyes: Negative.  Negative for blurred vision.  Respiratory: Negative.  Negative for apnea, cough, choking, chest tightness, shortness of breath, wheezing and stridor.   Cardiovascular: Negative.  Negative for chest pain, palpitations, orthopnea, leg swelling and PND.  Gastrointestinal: Negative.  Negative for nausea, vomiting, abdominal pain, diarrhea, constipation and blood in stool.  Endocrine: Negative.   Genitourinary: Negative.   Musculoskeletal: Positive for arthralgias (knees and wrists). Negative for myalgias, back pain, joint swelling and gait problem.  Skin: Negative.   Allergic/Immunologic: Negative.   Neurological: Negative.  Negative for dizziness, tremors, syncope, weakness, light-headedness and headaches.  Hematological: Negative.  Negative for adenopathy. Does not bruise/bleed easily.  Psychiatric/Behavioral: Negative.        Objective:   Physical Exam  Vitals reviewed. Constitutional: She is oriented to person, place, and  time. She appears well-developed and well-nourished. No distress.  HENT:  Head: Normocephalic and atraumatic.  Mouth/Throat: Oropharynx is clear and moist. No oropharyngeal exudate.  Eyes: Conjunctivae are normal. Right eye exhibits no discharge. Left eye exhibits no discharge. No scleral icterus.  Neck: Normal range of motion. Neck supple. No JVD present. No tracheal deviation present. No thyromegaly present.  Cardiovascular: Normal rate, regular rhythm, normal heart sounds and intact distal pulses.  Exam reveals no gallop and no friction rub.   No murmur heard. Pulmonary/Chest: Effort normal and breath sounds normal. No stridor. No respiratory distress. She has no wheezes. She has no rales. She exhibits no tenderness.  Abdominal: Soft. Bowel sounds are normal. She exhibits no distension and no mass. There is no tenderness. There is no rebound and no guarding.  Musculoskeletal: Normal range of motion. She exhibits no edema and no tenderness.       Right wrist: Normal. She exhibits no tenderness, no bony tenderness and no swelling.       Left wrist: Normal. She exhibits no tenderness, no bony tenderness and no swelling.       Right knee: Normal. She exhibits no swelling and no effusion.       Left knee: Normal. She exhibits no swelling and no effusion.  Lymphadenopathy:    She has no cervical adenopathy.  Neurological: She is oriented to person, place, and time.  Skin: Skin is warm and dry. No rash noted. She is not diaphoretic. No erythema. No pallor.  Psychiatric: She has a normal mood and affect. Her behavior is normal. Judgment and thought content normal.     Lab Results  Component Value Date   WBC 7.9 12/07/2011   HGB 8.2* 12/07/2011   HCT 31.1* 12/07/2011   PLT 357 12/07/2011   GLUCOSE 115* 12/07/2011   CHOL 205* 09/24/2011  TRIG 87.0 09/24/2011   HDL 43.50 09/24/2011   LDLDIRECT 140.4 09/24/2011   ALT 38* 09/24/2011   AST 51* 09/24/2011   NA 138 12/07/2011   K 3.4* 12/07/2011   CL  103 12/07/2011   CREATININE 0.67 12/07/2011   BUN 8 12/07/2011   CO2 26 12/07/2011   TSH 1.25 09/24/2011   HGBA1C 6.5 09/24/2011       Assessment & Plan:

## 2012-09-14 NOTE — Assessment & Plan Note (Signed)
I will recheck her A1C and will start meds if needed

## 2012-09-14 NOTE — Assessment & Plan Note (Signed)
I will recheck her CBC and her iron level 

## 2012-09-14 NOTE — Assessment & Plan Note (Signed)
I am concerned about SLE, will recheck her ANA today and will further screen for lupus with a ds-DNA and an anti-smith Ab

## 2012-09-14 NOTE — Assessment & Plan Note (Signed)
I will recheck her K+ and Mg++ level today

## 2012-09-14 NOTE — Assessment & Plan Note (Addendum)
I will recheck her ANA as well as ds-DNA/anti-smith Ab to see if there is concern for SLE She does not want anything for pain

## 2012-09-14 NOTE — Patient Instructions (Signed)

## 2012-09-14 NOTE — Assessment & Plan Note (Signed)
She has stopped the HCTZ, her BP is not well controlled I have asked her to start an ARB She knows that it is not safe to get pregnant on an ARB

## 2012-09-15 LAB — ANA: Anti Nuclear Antibody(ANA): NEGATIVE

## 2012-09-19 ENCOUNTER — Encounter: Payer: Self-pay | Admitting: Internal Medicine

## 2012-09-21 LAB — ANTI-SMITH ANTIBODY: ENA SM Ab Ser-aCnc: <1 AI (ref <1.0)

## 2012-09-21 LAB — ANTI-DNA ANTIBODY, DOUBLE-STRANDED: ds DNA Ab: <1 IU/mL (ref <=4)

## 2012-09-22 ENCOUNTER — Encounter: Payer: Self-pay | Admitting: Internal Medicine

## 2012-10-18 ENCOUNTER — Ambulatory Visit (INDEPENDENT_AMBULATORY_CARE_PROVIDER_SITE_OTHER): Payer: BC Managed Care – PPO | Admitting: Internal Medicine

## 2012-10-18 ENCOUNTER — Ambulatory Visit (INDEPENDENT_AMBULATORY_CARE_PROVIDER_SITE_OTHER)
Admission: RE | Admit: 2012-10-18 | Discharge: 2012-10-18 | Disposition: A | Discharge status: Home / Self Care | Payer: BC Managed Care – PPO | Source: Ambulatory Visit | Attending: Internal Medicine | Admitting: Internal Medicine

## 2012-10-18 ENCOUNTER — Encounter: Payer: Self-pay | Admitting: Internal Medicine

## 2012-10-18 VITALS — BP 142/94 | HR 92 | Temp 99.3°F | Wt 273.0 lb

## 2012-10-18 DIAGNOSIS — R609 Edema, unspecified: Secondary | ICD-10-CM

## 2012-10-18 DIAGNOSIS — R140 Abdominal distension (gaseous): Secondary | ICD-10-CM

## 2012-10-18 DIAGNOSIS — R079 Chest pain, unspecified: Secondary | ICD-10-CM

## 2012-10-18 DIAGNOSIS — R141 Gas pain: Secondary | ICD-10-CM

## 2012-10-18 DIAGNOSIS — R0602 Shortness of breath: Secondary | ICD-10-CM

## 2012-10-18 DIAGNOSIS — R600 Localized edema: Secondary | ICD-10-CM

## 2012-10-18 DIAGNOSIS — R142 Eructation: Secondary | ICD-10-CM

## 2012-10-18 MED ORDER — FAMOTIDINE 20 MG PO TABS: 20.0000 mg | ORAL_TABLET | Freq: Two times a day (BID) | ORAL | Status: DC

## 2012-10-18 MED ORDER — HYDROCHLOROTHIAZIDE 25 MG PO TABS: 25.0000 mg | ORAL_TABLET | Freq: Every day | ORAL | Status: DC

## 2012-10-18 NOTE — Patient Instructions (Signed)
Chest Pain (Nonspecific) °It is often hard to give a specific diagnosis for the cause of chest pain. There is always a chance that your pain could be related to something serious, such as a heart attack or a blood clot in the lungs. You need to follow up with your caregiver for further evaluation. °CAUSES  °· Heartburn. °· Pneumonia or bronchitis. °· Anxiety or stress. °· Inflammation around your heart (pericarditis) or lung (pleuritis or pleurisy). °· A blood clot in the lung. °· A collapsed lung (pneumothorax). It can develop suddenly on its own (spontaneous pneumothorax) or from injury (trauma) to the chest. °· Shingles infection (herpes zoster virus). °The chest wall is composed of bones, muscles, and cartilage. Any of these can be the source of the pain. °· The bones can be bruised by injury. °· The muscles or cartilage can be strained by coughing or overwork. °· The cartilage can be affected by inflammation and become sore (costochondritis). °DIAGNOSIS  °Lab tests or other studies, such as X-rays, electrocardiography, stress testing, or cardiac imaging, may be needed to find the cause of your pain.  °TREATMENT  °· Treatment depends on what may be causing your chest pain. Treatment may include: °· Acid blockers for heartburn. °· Anti-inflammatory medicine. °· Pain medicine for inflammatory conditions. °· Antibiotics if an infection is present. °· You may be advised to change lifestyle habits. This includes stopping smoking and avoiding alcohol, caffeine, and chocolate. °· You may be advised to keep your head raised (elevated) when sleeping. This reduces the chance of acid going backward from your stomach into your esophagus. °· Most of the time, nonspecific chest pain will improve within 2 to 3 days with rest and mild pain medicine. °HOME CARE INSTRUCTIONS  °· If antibiotics were prescribed, take your antibiotics as directed. Finish them even if you start to feel better. °· For the next few days, avoid physical  activities that bring on chest pain. Continue physical activities as directed. °· Do not smoke. °· Avoid drinking alcohol. °· Only take over-the-counter or prescription medicine for pain, discomfort, or fever as directed by your caregiver. °· Follow your caregiver's suggestions for further testing if your chest pain does not go away. °· Keep any follow-up appointments you made. If you do not go to an appointment, you could develop lasting (chronic) problems with pain. If there is any problem keeping an appointment, you must call to reschedule. °SEEK MEDICAL CARE IF:  °· You think you are having problems from the medicine you are taking. Read your medicine instructions carefully. °· Your chest pain does not go away, even after treatment. °· You develop a rash with blisters on your chest. °SEEK IMMEDIATE MEDICAL CARE IF:  °· You have increased chest pain or pain that spreads to your arm, neck, jaw, back, or abdomen. °· You develop shortness of breath, an increasing cough, or you are coughing up blood. °· You have severe back or abdominal pain, feel nauseous, or vomit. °· You develop severe weakness, fainting, or chills. °· You have a fever. °THIS IS AN EMERGENCY. Do not wait to see if the pain will go away. Get medical help at once. Call your local emergency services (911 in [place] Do not drive yourself to the hospital. °MAKE SURE YOU:  °· Understand these instructions. °· Will watch your condition. °· Will get help right away if you are not doing well or get worse. °Document Released: 12/10/2004 Document Revised: 05/25/2011 Document Reviewed: 10/06/2007 °[employer]® Patient Information ©2014 [employer]   [employer]. ° °

## 2012-10-18 NOTE — Progress Notes (Signed)
Subjective:    Patient ID: Lindsay Gomez, female    DOB: [birth year], 42 y.o.   MRN: [id number]  HPI  Pt presents to the clinic today with c/o chest pain and shortness of breath. This started about 2 weeks ago. She did call EMS for the same. They took her vitals and did and EKG, both of which were fine. She refused to go to the ER at that time. She is short of breath walking from the car to the building. She does have chronic atypical chest pain. She reports that is crampy. It is a quick attack that will last 2-5 minutes and only goes away on its on. Nothing makes it better or worse. She has seen by cardiology for this, they seem to think its musculoskeletal chest pain. She has no history of GERD but she does report burping and bloating of her stomach but is not on anything for reflux. She thinks these issues are related to her medication. She was recently started on Losartan and since that time, she has started having worse symptoms. She is also allergic to lisinopril and HCTZ.  Review of Systems      Past Medical History  Diagnosis Date  . Anemia   . Hypertension   . Allergy     rhinitis  . Morbid obesity     Current Outpatient Prescriptions  Medication Sig Dispense Refill  . Flaxseed, Linseed, (FLAX SEED OIL) 1000 MG CAPS Take 1 capsule by mouth daily.        Marland Kitchen losartan (COZAAR) 100 MG tablet Take 1 tablet (100 mg total) by mouth daily.  90 tablet  2   No current facility-administered medications for this visit.    Allergies  Allergen Reactions  . Hctz (Hydrochlorothiazide)     Upset stomach  . Lisinopril     REACTION: cough    Family History  Problem Relation Age of Onset  . Hypertension Mother   . Coronary artery disease Father   . Heart failure Father   . Heart attack Father   . Hypertension Other   . Hyperlipidemia Other     History   Social History  . Marital Status: Married    Spouse Name: N/A    Number of Children: N/A  . Years of Education: N/A    Occupational History  . Not on file.   Social History Main Topics  . Smoking status: Never Smoker   . Smokeless tobacco: Never Used  . Alcohol Use: No  . Drug Use: No  . Sexually Active: No   Other Topics Concern  . Not on file   Social History Narrative   Regular exercise     Constitutional: Denies fever, malaise, fatigue, headache or abrupt weight changes.  Respiratory: Pt reports shortness of breath. Denies difficulty breathing, cough or sputum production.   Cardiovascular: Pt reports chest pain and swelling in feet. Denies chest tightness, palpitations or swelling in the hands.  Gastrointestinal: bloating and burping. Denies abdominal pain, constipation, diarrhea or blood in the stool.  Neurological: Denies dizziness, difficulty with memory, difficulty with speech or problems with balance and coordination.   No other specific complaints in a complete review of systems (except as listed in HPI above).  Objective:   Physical Exam   BP 142/94  Pulse 92  Temp(Src) 99.3 F (37.4 C) (Oral)  Wt 273 lb (123.832 kg)  BMI 49.92 kg/m2  SpO2 97% Wt Readings from Last 3 Encounters:  10/18/12 273 lb (123.832 kg)  09/14/12 263 lb (119.296 kg)  12/10/11 271 lb (122.925 kg)    General: Appears her stated age, obese but well developed, well nourished in NAD.Marland Kitchen  Cardiovascular: Normal rate and rhythm. S1,S2 noted.  No murmur, rubs or gallops noted. No JVD, 2+ pitting edema BLE. No carotid bruits noted. Pulmonary/Chest: Normal effort and positive vesicular breath sounds. No respiratory distress. No wheezes, rales or ronchi noted.  Abdomen: Soft and nontender. Normal bowel sounds, no bruits noted. No distention or masses noted. Liver, spleen and kidneys non palpable. Neurological: Alert and oriented. Cranial nerves II-XII intact. Coordination normal. +DTRs bilaterally.   BMET    Component Value Date/Time   NA 138 09/14/2012 1035   K 3.1* 09/14/2012 1035   CL 103 09/14/2012 1035    CO2 23 09/14/2012 1035   GLUCOSE 122* 09/14/2012 1035   BUN 11 09/14/2012 1035   CREATININE 0.6 09/14/2012 1035   CALCIUM 9.1 09/14/2012 1035   GFRNONAA >90 12/07/2011 1430   GFRAA >90 12/07/2011 1430    Lipid Panel     Component Value Date/Time   CHOL 205* 09/24/2011 1013   TRIG 87.0 09/24/2011 1013   HDL 43.50 09/24/2011 1013   CHOLHDL 5 09/24/2011 1013   VLDL 17.4 09/24/2011 1013    CBC    Component Value Date/Time   WBC 7.4 09/14/2012 1035   RBC 4.51 09/14/2012 1035   HGB 7.5 Repeated and verified X2.* 09/14/2012 1035   HCT 26.6* 09/14/2012 1035   PLT 207.0 09/14/2012 1035   MCV 59.0* 09/14/2012 1035   MCH 17.2* 12/07/2011 1430   MCHC 28.3* 09/14/2012 1035   RDW 21.4* 09/14/2012 1035   LYMPHSABS 1.6 09/14/2012 1035   MONOABS 0.2 09/14/2012 1035   EOSABS 0.1 09/14/2012 1035   BASOSABS 0.0 09/14/2012 1035    Hgb A1C Lab Results  Component Value Date   HGBA1C 6.3 09/14/2012        Assessment & Plan:   Atypical chest pain, shortness of breath, peripheral edema, new onset:  Given fever, will check chest xray ? Related to Losartan Will stop medication today to see if your symptoms get better Pt wants to restart her HCTZ and recheck her BMET and BP in 2 weeks Chest pain ? GERD- EKG 2 weeks ago normal- will start pepcid  F/u in 2 weeks to recheck BMET and BP

## 2012-10-26 ENCOUNTER — Encounter: Payer: Self-pay | Admitting: Internal Medicine

## 2012-10-26 ENCOUNTER — Ambulatory Visit (INDEPENDENT_AMBULATORY_CARE_PROVIDER_SITE_OTHER): Payer: BC Managed Care – PPO | Admitting: Internal Medicine

## 2012-10-26 VITALS — BP 140/100 | HR 94 | Temp 97.9°F | Ht 62.0 in | Wt 264.2 lb

## 2012-10-26 DIAGNOSIS — M5416 Radiculopathy, lumbar region: Secondary | ICD-10-CM | POA: Insufficient documentation

## 2012-10-26 DIAGNOSIS — I10 Essential (primary) hypertension: Secondary | ICD-10-CM

## 2012-10-26 DIAGNOSIS — IMO0001 Reserved for inherently not codable concepts without codable children: Secondary | ICD-10-CM

## 2012-10-26 DIAGNOSIS — M25571 Pain in right ankle and joints of right foot: Secondary | ICD-10-CM

## 2012-10-26 DIAGNOSIS — M25579 Pain in unspecified ankle and joints of unspecified foot: Secondary | ICD-10-CM

## 2012-10-26 DIAGNOSIS — IMO0002 Reserved for concepts with insufficient information to code with codable children: Secondary | ICD-10-CM

## 2012-10-26 MED ORDER — OXYCODONE HCL 5 MG PO TABS: ORAL_TABLET | ORAL | Status: DC

## 2012-10-26 MED ORDER — OXYCODONE HCL 5 MG PO TABS: 5.0000 mg | ORAL_TABLET | Freq: Four times a day (QID) | ORAL | Status: DC | PRN

## 2012-10-26 MED ORDER — KETOROLAC TROMETHAMINE 30 MG/ML IJ SOLN
30.0000 mg | Freq: Once | INTRAMUSCULAR | Status: AC
  Administered 2012-10-26: 30 mg via INTRAMUSCULAR

## 2012-10-26 MED ORDER — PREDNISONE 10 MG PO TABS: ORAL_TABLET | ORAL | Status: DC

## 2012-10-26 NOTE — Assessment & Plan Note (Addendum)
With severe pain, for pain control, predpack asd, MRI LS Spine/orthoedic referral  Note:  Total time for pt hx, exam, review of record with pt in the room, determination of diagnoses and plan for further eval and tx is > 40 min, with over 50% spent in coordination and counseling of patient

## 2012-10-26 NOTE — Progress Notes (Signed)
Subjective:    Patient ID: Lindsay Gomez, female    DOB: 08-31-1970, 42 y.o.   MRN: [id number]  HPI  Pt c/o new onset 4 days severe constant right LBP with radiation to the distal RLE, where she also has recurrent right ankle pain for many months to years, but no bowel or bladder change, fever, wt loss, gait change or falls, but has had RLE weakness, no numbness.  Pt denies chest pain, increased sob or doe, wheezing, orthopnea, PND, increased LE swelling, palpitations, dizziness or syncope.  Pt denies new neurological symptoms such as new headache, or facial or extremity weakness or numbness except for the above.  BP normally much better controlled per pt. Past Medical History  Diagnosis Date  . Anemia   . Hypertension   . Allergy     rhinitis  . Morbid obesity    Past Surgical History  Procedure Laterality Date  . Knee surgery  1992    ? arthroscopic/ right    reports that she has never smoked. She has never used smokeless tobacco. She reports that she does not drink alcohol or use illicit drugs. family history includes Coronary artery disease in her father; Heart attack in her father; Heart failure in her father; Hyperlipidemia in her other; Hypertension in her mother and other. Allergies  Allergen Reactions  . Hctz [Hydrochlorothiazide]     Upset stomach  . Lisinopril     REACTION: cough   Current Outpatient Prescriptions on File Prior to Visit  Medication Sig Dispense Refill  . famotidine (PEPCID) 20 MG tablet Take 1 tablet (20 mg total) by mouth 2 (two) times daily.  30 tablet  0  . Flaxseed, Linseed, (FLAX SEED OIL) 1000 MG CAPS Take 1 capsule by mouth daily.        . hydrochlorothiazide (HYDRODIURIL) 25 MG tablet Take 1 tablet (25 mg total) by mouth daily.  90 tablet  3   No current facility-administered medications on file prior to visit.   Review of Systems  Constitutional: Negative for unexpected weight change, or unusual diaphoresis  HENT: Negative for tinnitus.    Eyes: Negative for photophobia and visual disturbance.  Respiratory: Negative for choking and stridor.   Gastrointestinal: Negative for vomiting and blood in stool.  Genitourinary: Negative for hematuria and decreased urine volume.  Musculoskeletal: Negative for acute joint swelling Skin: Negative for color change and wound.  Neurological: Negative for tremors and numbness other than noted  Psychiatric/Behavioral: Negative for decreased concentration or  hyperactivity.       Objective:   Physical Exam BP 140/100  Pulse 94  Temp(Src) 97.9 F (36.6 C) (Oral)  Ht 5\' 2"  (1.575 m)  Wt 264 lb 4 oz (119.863 kg)  BMI 48.32 kg/m2  SpO2 98%  LMP 09/27/2012 VS noted,  Constitutional: Pt appears well-developed and well-nourished.  HENT: Head: NCAT.  Right Ear: External ear normal.  Left Ear: External ear normal.  Eyes: Conjunctivae and EOM are normal. Pupils are equal, round, and reactive to light.  Neck: Normal range of motion. Neck supple.  Cardiovascular: Normal rate and regular rhythm.   Pulmonary/Chest: Effort normal and breath sounds normal.  Abd:  Soft, NT, non-distended, + BS Neurological: Pt is alert. Not confused , motor 4+/5 RLE, sens/dtr intact Skin: Skin is warm. No erythema.  Psychiatric: Pt behavior is normal. Thought content normal. mild nervous Spine nontender , + tender to right buttock/paravertebral RIght ankle with anterior tender but ankle o/w NT, no effusion and normal ROM  Assessment & Plan:

## 2012-10-26 NOTE — Assessment & Plan Note (Signed)
Lab Results  Component Value Date   HGBA1C 6.3 09/14/2012   Pt to call for onset polys or cbg > 200 on predpack

## 2012-10-26 NOTE — Patient Instructions (Signed)
You had the pain shot today (toradol) Please take all new medication as prescribed - the pain medication, and prednisone Please continue all other medications as before Please have the pharmacy call with any other refills you may need. You will be contacted regarding the referral for: MRI lower back, and orthopedic (both ordered "urgent")

## 2012-10-26 NOTE — Assessment & Plan Note (Signed)
I suspect some incidental dorsal foot tenosynovitis with extension to ankle level, exam with mild tender but o/w benign

## 2012-10-26 NOTE — Assessment & Plan Note (Signed)
With situational worsening, for better pain control,  to f/u any worsening symptoms or concerns

## 2012-11-01 ENCOUNTER — Other Ambulatory Visit: Payer: BC Managed Care – PPO

## 2012-11-05 ENCOUNTER — Encounter (HOSPITAL_COMMUNITY): Payer: Self-pay | Admitting: Emergency Medicine

## 2012-11-05 ENCOUNTER — Emergency Department (HOSPITAL_COMMUNITY)
Admission: EM | Admit: 2012-11-05 | Discharge: 2012-11-05 | Disposition: A | Discharge status: Home / Self Care | Payer: BC Managed Care – PPO | Attending: Emergency Medicine | Admitting: Emergency Medicine

## 2012-11-05 DIAGNOSIS — R5381 Other malaise: Secondary | ICD-10-CM | POA: Insufficient documentation

## 2012-11-05 DIAGNOSIS — Z862 Personal history of diseases of the blood and blood-forming organs and certain disorders involving the immune mechanism: Secondary | ICD-10-CM | POA: Insufficient documentation

## 2012-11-05 DIAGNOSIS — IMO0002 Reserved for concepts with insufficient information to code with codable children: Secondary | ICD-10-CM | POA: Insufficient documentation

## 2012-11-05 DIAGNOSIS — Z791 Long term (current) use of non-steroidal anti-inflammatories (NSAID): Secondary | ICD-10-CM | POA: Insufficient documentation

## 2012-11-05 DIAGNOSIS — M5416 Radiculopathy, lumbar region: Secondary | ICD-10-CM

## 2012-11-05 DIAGNOSIS — I10 Essential (primary) hypertension: Secondary | ICD-10-CM | POA: Insufficient documentation

## 2012-11-05 DIAGNOSIS — Z79899 Other long term (current) drug therapy: Secondary | ICD-10-CM | POA: Insufficient documentation

## 2012-11-05 MED ORDER — TRAMADOL HCL 50 MG PO TABS
50.0000 mg | ORAL_TABLET | Freq: Once | ORAL | Status: AC
  Administered 2012-11-05: 50 mg via ORAL
  Filled 2012-11-05: qty 1

## 2012-11-05 MED ORDER — MELOXICAM 15 MG PO TABS: 15.0000 mg | ORAL_TABLET | Freq: Every day | ORAL | Status: DC

## 2012-11-05 MED ORDER — TRAMADOL HCL 50 MG PO TABS: 50.0000 mg | ORAL_TABLET | Freq: Four times a day (QID) | ORAL | Status: DC | PRN

## 2012-11-05 MED ORDER — KETOROLAC TROMETHAMINE 60 MG/2ML IM SOLN
60.0000 mg | Freq: Once | INTRAMUSCULAR | Status: AC
  Administered 2012-11-05: 60 mg via INTRAMUSCULAR
  Filled 2012-11-05: qty 2

## 2012-11-05 NOTE — ED Provider Notes (Signed)
CSN: [id number]     Arrival date & time 11/05/12  1417 History     First MD Initiated Contact with Patient 11/05/12 1420     Chief Complaint  Patient presents with  . Back Pain   (Consider location/radiation/quality/duration/timing/severity/associated sxs/prior Treatment) HPI Patient is a 42 year old female who presents to emergency department complaining of back pain. Patient states her back pain began several weeks ago. States the pain starts in the middle of the lower back radiates to the right buttocks, goes down the right leg, and into the right foot. Patient states she has pain with straight leg movement, and feels like it's weak. Patient denies any numbness in the foot. Patient denies any abdominal pain. Patient denies any loss of bowel or bladder control. Patient denies any fever. Patient denies any IV drug use. Patient states she went to her primary care doctor a week ago, who put her on a prednisone course, and given her some pain medications. Patient states she finished her prednisone which has helped slightly however pain returned after she stopped prednisone. Patient states she's unable to take pain medications because they make her sleepy and she has to work. Patient states she was referred by her primary care doctor to see an orthopedic specialist but states she did not go because she did not have money to pay the co-pay. Patient states she is here "to get some answers and to figure out exactly what is going on." patient states she's currently not taking any medications. Patient states her pain is worsened by walking, certain positions, and raising her right leg. Nothing is making pain better Past Medical History  Diagnosis Date  . Anemia   . Hypertension   . Allergy     rhinitis  . Morbid obesity    Past Surgical History  Procedure Laterality Date  . Knee surgery  1992    ? arthroscopic/ right   Family History  Problem Relation Age of Onset  . Hypertension Mother   .  Coronary artery disease Father   . Heart failure Father   . Heart attack Father   . Hypertension Other   . Hyperlipidemia Other    History  Substance Use Topics  . Smoking status: Never Smoker   . Smokeless tobacco: Never Used  . Alcohol Use: No   OB History   Grav Para Term Preterm Abortions TAB SAB Ect Mult Living                 Review of Systems  Constitutional: Negative for fever and chills.  HENT: Negative for neck pain and neck stiffness.   Respiratory: Negative for cough, chest tightness and shortness of breath.   Cardiovascular: Negative for chest pain, palpitations and leg swelling.  Gastrointestinal: Negative for nausea, vomiting, abdominal pain and diarrhea.  Genitourinary: Negative for dysuria, flank pain, vaginal bleeding, vaginal discharge, vaginal pain and pelvic pain.  Musculoskeletal: Positive for back pain. Negative for myalgias and arthralgias.  Skin: Negative for rash.  Neurological: Positive for weakness. Negative for dizziness, numbness and headaches.  All other systems reviewed and are negative.    Allergies  Hctz and Lisinopril  Home Medications   Current Outpatient Rx  Name  Route  Sig  Dispense  Refill  . acetaminophen (TYLENOL) 325 MG tablet   Oral   Take 650 mg by mouth every 6 (six) hours as needed for pain.         . Flaxseed, Linseed, (FLAX SEED OIL) 1000 MG CAPS  Oral   Take 1 capsule by mouth daily.           . hydrochlorothiazide (HYDRODIURIL) 25 MG tablet   Oral   Take 25 mg by mouth at bedtime.         . meloxicam (MOBIC) 15 MG tablet   Oral   Take 1 tablet (15 mg total) by mouth daily.   20 tablet   0   . predniSONE (DELTASONE) 10 MG tablet      2tabs per day for 7 days   14 tablet   0   . traMADol (ULTRAM) 50 MG tablet   Oral   Take 1 tablet (50 mg total) by mouth every 6 (six) hours as needed for pain.   20 tablet   0    BP 172/97  Pulse 104  Temp(Src) 98.4 F (36.9 C) (Oral)  Resp 15  SpO2 99%   LMP 09/27/2012 Physical Exam  Nursing note and vitals reviewed. Constitutional: She is oriented to person, place, and time. She appears well-developed and well-nourished. No distress.  HENT:  Head: Normocephalic.  Eyes: Conjunctivae are normal.  Neck: Neck supple.  Cardiovascular: Normal rate, regular rhythm and normal heart sounds.   Pulmonary/Chest: Effort normal and breath sounds normal. No respiratory distress. She has no wheezes. She has no rales.  Abdominal: Soft. Bowel sounds are normal. She exhibits no distension. There is no tenderness. There is no rebound.  Musculoskeletal: She exhibits no edema.  Midline lumbar spine tenderness. Tenderness in the right SI joint and right buttock. Pain with right straight leg raise.  Neurological: She is alert and oriented to person, place, and time.  5/5 and equal lower extremity strength. 2+ and equal patellar reflexes bilaterally. Pt able to dorsiflex bilateral toes and feet with good strength against resistance. Equal sensation bilaterally over thighs and lower legs.   Skin: Skin is warm and dry.  Psychiatric: She has a normal mood and affect. Her behavior is normal.    ED Course   Procedures (including critical care time)  Labs Reviewed - No data to display No results found. 1. Lumbar radiculopathy     MDM  Patient with back pain for several weeks. States no improvement with medications at home and prednisone. Was already seen by her doctor if her to orthopedics whom she did not go see because she does not have any money to pay the co-pay. She told me "who is going to pay my co-pay?" Patient with no neuro deficits and no signs of cauda equina. Patient has no further indications for any imaging. Patient asking for pain medications however states has not wanted that we'll sedate me. Patient refused Toradol because it does not work. Explained that this is the best I can offer her without sedating her. I will be prescribing her Mobic at home  and Ultram for her pain. She is instructed to follow back with her primary care doctor to see they can set up her MRI. Patient's blood pressure is elevated in the ER instructed to follow up with her doctor.  Filed Vitals:   11/05/12 1431  BP: 172/97  Pulse: 104  Temp: 98.4 F (36.9 C)  TempSrc: Oral  Resp: 15  SpO2: 99%     Lottie Mussel, PA-C 11/05/12 1545

## 2012-11-05 NOTE — ED Notes (Signed)
Pt from home, c/o back pain. Pt was seen by PCP for same, states she has finished her prednisone and pain meds put her to sleep. Pt states she "wants to know what is going on"

## 2012-11-05 NOTE — ED Provider Notes (Signed)
Medical screening examination/treatment/procedure(s) were performed by non-physician practitioner and as supervising physician I was immediately available for consultation/collaboration.   William Baily Serpe, MD 11/05/12 1554 

## 2012-11-05 NOTE — ED Notes (Signed)
Pt refuses to have Toradol. She states that she was given Toradol at the MD office on 08/13 and "it didn't help one bit". Will speak with provider.

## 2012-11-18 ENCOUNTER — Encounter: Payer: Self-pay | Admitting: Internal Medicine

## 2012-11-18 ENCOUNTER — Other Ambulatory Visit (INDEPENDENT_AMBULATORY_CARE_PROVIDER_SITE_OTHER): Payer: BC Managed Care – PPO

## 2012-11-18 ENCOUNTER — Ambulatory Visit (INDEPENDENT_AMBULATORY_CARE_PROVIDER_SITE_OTHER): Payer: BC Managed Care – PPO | Admitting: Internal Medicine

## 2012-11-18 ENCOUNTER — Telehealth: Payer: Self-pay | Admitting: *Deleted

## 2012-11-18 VITALS — BP 134/86 | HR 80 | Temp 98.7°F | Resp 16 | Wt 266.0 lb

## 2012-11-18 DIAGNOSIS — M5416 Radiculopathy, lumbar region: Secondary | ICD-10-CM

## 2012-11-18 DIAGNOSIS — I10 Essential (primary) hypertension: Secondary | ICD-10-CM

## 2012-11-18 DIAGNOSIS — IMO0002 Reserved for concepts with insufficient information to code with codable children: Secondary | ICD-10-CM

## 2012-11-18 DIAGNOSIS — D509 Iron deficiency anemia, unspecified: Secondary | ICD-10-CM

## 2012-11-18 DIAGNOSIS — N920 Excessive and frequent menstruation with regular cycle: Secondary | ICD-10-CM

## 2012-11-18 DIAGNOSIS — R300 Dysuria: Secondary | ICD-10-CM

## 2012-11-18 DIAGNOSIS — E876 Hypokalemia: Secondary | ICD-10-CM

## 2012-11-18 DIAGNOSIS — IMO0001 Reserved for inherently not codable concepts without codable children: Secondary | ICD-10-CM

## 2012-11-18 LAB — CBC WITH DIFFERENTIAL/PLATELET
Basophils Absolute: 0.2 10*3/uL — ABNORMAL HIGH (ref 0.0–0.1)
Basophils Relative: 1.5 % (ref 0.0–3.0)
Eosinophils Absolute: 0.2 10*3/uL (ref 0.0–0.7)
Eosinophils Relative: 1.8 % (ref 0.0–5.0)
HCT: 25.8 % — ABNORMAL LOW (ref 36.0–46.0)
Hemoglobin: 7.3 g/dL — CL (ref 12.0–15.0)
Lymphocytes Relative: 21.7 % (ref 12.0–46.0)
Lymphs Abs: 2.4 10*3/uL (ref 0.7–4.0)
MCHC: 28.1 g/dL — ABNORMAL LOW (ref 30.0–36.0)
MCV: 60.2 fl — ABNORMAL LOW (ref 78.0–100.0)
Monocytes Absolute: 0.5 10*3/uL (ref 0.1–1.0)
Monocytes Relative: 4.2 % (ref 3.0–12.0)
Neutro Abs: 7.7 10*3/uL (ref 1.4–7.7)
Neutrophils Relative %: 70.8 % (ref 43.0–77.0)
Platelets: 612 10*3/uL — ABNORMAL HIGH (ref 150.0–400.0)
RBC: 4.28 Mil/uL (ref 3.87–5.11)
RDW: 25.5 % — ABNORMAL HIGH (ref 11.5–14.6)
WBC: 10.9 10*3/uL — ABNORMAL HIGH (ref 4.5–10.5)

## 2012-11-18 LAB — BASIC METABOLIC PANEL
BUN: 11 mg/dL (ref 6–23)
CO2: 30 mEq/L (ref 19–32)
Calcium: 8.5 mg/dL (ref 8.4–10.5)
Chloride: 101 mEq/L (ref 96–112)
Creatinine, Ser: 0.6 mg/dL (ref 0.4–1.2)
GFR: 147.91 mL/min (ref >60.00)
Glucose, Bld: 96 mg/dL (ref 70–99)
Potassium: 2.8 mEq/L — CL (ref 3.5–5.1)
Sodium: 137 mEq/L (ref 135–145)

## 2012-11-18 LAB — URINALYSIS, ROUTINE W REFLEX MICROSCOPIC
Bilirubin Urine: NEGATIVE
Ketones, ur: NEGATIVE
Nitrite: NEGATIVE
Specific Gravity, Urine: 1.015 (ref 1.000–1.030)
Total Protein, Urine: 100
Urine Glucose: NEGATIVE
Urobilinogen, UA: 0.2 (ref 0.0–1.0)
pH: 8.5 (ref 5.0–8.0)

## 2012-11-18 LAB — CORTISOL: Cortisol, Plasma: 6.6 ug/dL

## 2012-11-18 LAB — MAGNESIUM: Magnesium: 1.8 mg/dL (ref 1.5–2.5)

## 2012-11-18 NOTE — Patient Instructions (Signed)
Back Pain, Adult  Low back pain is very common. About 1 in 5 people have back pain. The cause of low back pain is rarely dangerous. The pain often gets better over time. About half of people with a sudden onset of back pain feel better in just 2 weeks. About 8 in 10 people feel better by 6 weeks.   CAUSES  Some common causes of back pain include:  · Strain of the muscles or ligaments supporting the spine.  · Wear and tear (degeneration) of the spinal discs.  · Arthritis.  · Direct injury to the back.  DIAGNOSIS  Most of the time, the direct cause of low back pain is not known. However, back pain can be treated effectively even when the exact cause of the pain is unknown. Answering your caregiver's questions about your overall health and symptoms is one of the most accurate ways to make sure the cause of your pain is not dangerous. If your caregiver needs more information, he or she may order lab work or imaging tests (X-rays or MRIs). However, even if imaging tests show changes in your back, this usually does not require surgery.  HOME CARE INSTRUCTIONS  For many people, back pain returns. Since low back pain is rarely dangerous, it is often a condition that people can learn to manage on their own.   · Remain active. It is stressful on the back to sit or stand in one place. Do not sit, drive, or stand in one place for more than 30 minutes at a time. Take short walks on level surfaces as soon as pain allows. Try to increase the length of time you walk each day.  · Do not stay in bed. Resting more than 1 or 2 days can delay your recovery.  · Do not avoid exercise or work. Your body is made to move. It is not dangerous to be active, even though your back may hurt. Your back will likely heal faster if you return to being active before your pain is gone.  · Pay attention to your body when you  bend and lift. Many people have less discomfort when lifting if they bend their knees, keep the load close to their bodies, and  avoid twisting. Often, the most comfortable positions are those that put less stress on your recovering back.  · Find a comfortable position to sleep. Use a firm mattress and lie on your side with your knees slightly bent. If you lie on your back, put a pillow under your knees.  · Only take over-the-counter or prescription medicines as directed by your caregiver. Over-the-counter medicines to reduce pain and inflammation are often the most helpful. Your caregiver may prescribe muscle relaxant drugs. These medicines help dull your pain so you can more quickly return to your normal activities and healthy exercise.  · Put ice on the injured area.  · Put ice in a plastic bag.  · Place a towel between your skin and the bag.  · Leave the ice on for 15-20 minutes, 3-4 times a day for the first 2 to 3 days. After that, ice and heat may be alternated to reduce pain and spasms.  · Ask your caregiver about trying back exercises and gentle massage. This may be of some benefit.  · Avoid feeling anxious or stressed. Stress increases muscle tension and can worsen back pain. It is important to recognize when you are anxious or stressed and learn ways to manage it. Exercise is a great option.  SEEK MEDICAL CARE IF:  · You have pain that is not relieved with rest or   medicine.  · You have pain that does not improve in 1 week.  · You have new symptoms.  · You are generally not feeling well.  SEEK IMMEDIATE MEDICAL CARE IF:   · You have pain that radiates from your back into your legs.  · You develop new bowel or bladder control problems.  · You have unusual weakness or numbness in your arms or legs.  · You develop nausea or vomiting.  · You develop abdominal pain.  · You feel faint.  Document Released: 03/02/2005 Document Revised: 09/01/2011 Document Reviewed: 07/21/2010  [employer]® Patient Information ©2014 [employer].

## 2012-11-18 NOTE — Telephone Encounter (Signed)
Rec call from Clydie Braun in  [facility] lab. Pt's Hgb is 7.3 and Hct is 25.8. I informed Dr. Posey Rea of pt's results as Dr. Yetta Barre is gone for the day. He advised pt to hold prednisone and meloxicam for now. Start taking OTC prilosec and Keep her appt with PCP as scheduled or go to nearest ER or UC if she becomes weak or fatigued or starts feeling bad. I called pt- no answer/left detailed mess informing her of MD's advisement and offered our Sat clinic hours.

## 2012-11-18 NOTE — Progress Notes (Signed)
Subjective:    Patient ID: Lindsay Gomez, female    DOB: 1970/04/08, 42 y.o.   MRN: [id number]  Dysuria  This is a new problem. The current episode started in the past 7 days. The problem occurs intermittently. The problem has been unchanged. The quality of the pain is described as burning. The pain is at a severity of 1/10. There has been no fever. The fever has been present for less than 1 day. She is not sexually active. There is no history of pyelonephritis. Pertinent negatives include no chills, discharge, flank pain, frequency, hematuria, hesitancy, nausea, possible pregnancy, sweats, urgency or vomiting. She has tried nothing for the symptoms.      Review of Systems  Constitutional: Negative.  Negative for fever, chills, diaphoresis, activity change, appetite change, fatigue and unexpected weight change.  HENT: Negative.   Eyes: Negative.   Respiratory: Negative.  Negative for cough, chest tightness, shortness of breath and stridor.   Cardiovascular: Negative.  Negative for chest pain, palpitations and leg swelling.  Gastrointestinal: Negative.  Negative for nausea, vomiting, abdominal pain, diarrhea and constipation.  Endocrine: Negative.   Genitourinary: Positive for dysuria, vaginal bleeding, vaginal discharge and vaginal pain. Negative for hesitancy, urgency, frequency, hematuria, flank pain, decreased urine volume, enuresis, difficulty urinating, genital sores, menstrual problem, pelvic pain and dyspareunia.  Musculoskeletal: Positive for back pain (LBP that radiates into her RLE). Negative for myalgias, joint swelling and gait problem.  Skin: Negative.   Allergic/Immunologic: Negative.   Neurological: Negative.  Negative for dizziness.  Hematological: Negative.  Negative for adenopathy. Does not bruise/bleed easily.  Psychiatric/Behavioral: Negative.        Objective:   Physical Exam  Vitals reviewed. Constitutional: She is oriented to person, place, and time. She  appears well-developed and well-nourished. No distress.  HENT:  Head: Normocephalic and atraumatic.  Mouth/Throat: Oropharynx is clear and moist. No oropharyngeal exudate.  Eyes: Conjunctivae are normal. Right eye exhibits no discharge. Left eye exhibits no discharge. No scleral icterus.  Neck: Normal range of motion. Neck supple. No JVD present. No tracheal deviation present. No thyromegaly present.  Cardiovascular: Normal rate, regular rhythm, normal heart sounds and intact distal pulses.  Exam reveals no gallop and no friction rub.   No murmur heard. Pulmonary/Chest: Effort normal and breath sounds normal. No stridor. No respiratory distress. She has no wheezes. She has no rales. She exhibits no tenderness.  Abdominal: Soft. Bowel sounds are normal. She exhibits no distension and no mass. There is no hepatosplenomegaly, splenomegaly or hepatomegaly. There is no tenderness. There is no rebound, no guarding and no CVA tenderness. No hernia. Hernia confirmed negative in the ventral area.  Genitourinary:  She refused a GU/rectal exam today  Musculoskeletal: Normal range of motion. She exhibits no edema and no tenderness.  Lymphadenopathy:    She has no cervical adenopathy.  Neurological: She is alert and oriented to person, place, and time. She has normal strength. She displays no atrophy, no tremor and normal reflexes. No cranial nerve deficit or sensory deficit. She exhibits normal muscle tone. She displays a negative Romberg sign. She displays no seizure activity. Coordination and gait normal.  Reflex Scores:      Tricep reflexes are 1+ on the right side and 1+ on the left side.      Bicep reflexes are 1+ on the right side and 1+ on the left side.      Brachioradialis reflexes are 1+ on the right side and 1+ on the left side.  Patellar reflexes are 1+ on the right side and 1+ on the left side.      Achilles reflexes are 1+ on the right side and 1+ on the left side. Neg SLR in BLE  Skin:  Skin is warm and dry. No rash noted. She is not diaphoretic. No erythema. No pallor.  Psychiatric: She has a normal mood and affect. Her behavior is normal. Judgment and thought content normal.     Lab Results  Component Value Date   WBC 7.4 09/14/2012   HGB 7.5 Repeated and verified X2.* 09/14/2012   HCT 26.6* 09/14/2012   PLT 207.0 09/14/2012   GLUCOSE 122* 09/14/2012   CHOL 205* 09/24/2011   TRIG 87.0 09/24/2011   HDL 43.50 09/24/2011   LDLDIRECT 140.4 09/24/2011   ALT 24 09/14/2012   AST 29 09/14/2012   NA 138 09/14/2012   K 3.1* 09/14/2012   CL 103 09/14/2012   CREATININE 0.6 09/14/2012   BUN 11 09/14/2012   CO2 23 09/14/2012   TSH 1.18 09/14/2012   HGBA1C 6.3 09/14/2012       Assessment & Plan:

## 2012-11-19 ENCOUNTER — Encounter: Payer: Self-pay | Admitting: Internal Medicine

## 2012-11-19 LAB — CULTURE, URINE COMPREHENSIVE
Colony Count: NO GROWTH
Organism ID, Bacteria: NO GROWTH

## 2012-11-19 LAB — POTASSIUM, URINE, RANDOM: Potassium Urine: 45 mEq/L

## 2012-11-19 MED ORDER — POTASSIUM CHLORIDE CRYS ER 20 MEQ PO TBCR: 20.0000 meq | EXTENDED_RELEASE_TABLET | Freq: Three times a day (TID) | ORAL | Status: DC

## 2012-11-20 ENCOUNTER — Encounter: Payer: Self-pay | Admitting: Internal Medicine

## 2012-11-20 DIAGNOSIS — R300 Dysuria: Secondary | ICD-10-CM | POA: Insufficient documentation

## 2012-11-20 NOTE — Assessment & Plan Note (Signed)
She will continue meds and will try PT for the pain

## 2012-11-20 NOTE — Assessment & Plan Note (Signed)
Her BP is well controlled 

## 2012-11-20 NOTE — Assessment & Plan Note (Signed)
Her A1C shows good control

## 2012-11-20 NOTE — Assessment & Plan Note (Signed)
Her UA and urine culture are normal

## 2012-11-20 NOTE — Assessment & Plan Note (Signed)
This is caused by the HCTZ I have asked her to start K+ replacement therapy

## 2012-11-20 NOTE — Assessment & Plan Note (Signed)
This has worsened some I have asked her to return to recheck her iron level

## 2012-11-20 NOTE — Assessment & Plan Note (Signed)
I think her symptoms are related to the vaginitis so I have asked her to see GYN as she has refused the exam with me today

## 2012-11-21 ENCOUNTER — Telehealth: Payer: Self-pay | Admitting: *Deleted

## 2012-11-21 ENCOUNTER — Encounter: Payer: Self-pay | Admitting: Internal Medicine

## 2012-11-21 ENCOUNTER — Ambulatory Visit (INDEPENDENT_AMBULATORY_CARE_PROVIDER_SITE_OTHER): Payer: BC Managed Care – PPO | Admitting: Internal Medicine

## 2012-11-21 VITALS — BP 168/108 | HR 103 | Temp 98.7°F | Resp 16 | Wt 268.0 lb

## 2012-11-21 DIAGNOSIS — I10 Essential (primary) hypertension: Secondary | ICD-10-CM

## 2012-11-21 DIAGNOSIS — E876 Hypokalemia: Secondary | ICD-10-CM

## 2012-11-21 DIAGNOSIS — N920 Excessive and frequent menstruation with regular cycle: Secondary | ICD-10-CM

## 2012-11-21 DIAGNOSIS — D509 Iron deficiency anemia, unspecified: Secondary | ICD-10-CM

## 2012-11-21 MED ORDER — FERROUS GLUCONATE 325 MG PO TABS: 325.0000 mg | ORAL_TABLET | Freq: Two times a day (BID) | ORAL | Status: DC

## 2012-11-21 NOTE — Assessment & Plan Note (Signed)
I will recheck her K+ level and will check her Mg++ level today as well She will continue with iron replacement therapy

## 2012-11-21 NOTE — Telephone Encounter (Signed)
[facility] Triage Call Report Triage Record Num: [id number] Operator: Donnella Sham Patient Name: Lindsay Gomez Call Date & Time: 11/18/2012 5:36:25PM Patient Phone: [phone] PCP: Patient Gender: Female PCP Fax : Patient DOB: 1970-12-22 Practice Name: [facility] Reason for Call: Caller: Tonya/[facility]; PCP: Sanda Linger (Adults only); CB#: [phone]; Call regarding: Potassium; drawn routine 11/18/12 at 1625; results critical at 2.8; last Potassium drawn 09/14/12 and was 3.1; notified Dr.Panosh; order given to stop Hctz and start Potassium daily, disp 10; also advised appt 9/8 and to be seen in ED if starts feeling poorly or having sxs; notified pt; appt scheduled for 9/8 at 1545 with Dr.Jones; expressed understanding; rx called in to [facility] at [facility] [phone] Protocol(s) Used: Office Note Recommended Outcome per Protocol: Information Noted and Sent to Office Reason for Outcome: Caller information to office Care Advice: ~ 09/

## 2012-11-21 NOTE — Assessment & Plan Note (Signed)
Her BP is too high - she wants to restart the HCTZ

## 2012-11-21 NOTE — Progress Notes (Signed)
Subjective:    Patient ID: Lindsay Gomez, female    DOB: 25-Oct-1970, 42 y.o.   MRN: [id number]  HPI  She returns for f/up after being seen 3 days ago and was found to have a worsening anemia that is caused by long heavy periods (fibroids) and noncompliance with iron replacement therapy as well as low K+ caused by the HCTZ therapy. She was told by Dr. Fabian Sharp to stop the HCTZ but she requests that she stay on it. She has been off of the HCTZ for three days and her BP is rising. She has low back pain but otherwise she feels well.   Review of Systems  Constitutional: Positive for fatigue. Negative for fever, chills, diaphoresis, activity change, appetite change and unexpected weight change.  HENT: Negative.   Eyes: Negative.   Respiratory: Negative.  Negative for cough, chest tightness, shortness of breath, wheezing and stridor.   Cardiovascular: Negative.  Negative for chest pain, palpitations and leg swelling.  Gastrointestinal: Negative for nausea, abdominal pain, diarrhea, constipation and anal bleeding.  Endocrine: Negative.   Genitourinary: Positive for vaginal bleeding and menstrual problem. Negative for frequency, hematuria, vaginal discharge and vaginal pain.  Musculoskeletal: Positive for back pain. Negative for myalgias, joint swelling and gait problem.  Skin: Negative.   Allergic/Immunologic: Negative.   Neurological: Negative.  Negative for dizziness, tremors, seizures, syncope, facial asymmetry, speech difficulty, weakness, light-headedness, numbness and headaches.  Hematological: Negative.  Negative for adenopathy. Does not bruise/bleed easily.  Psychiatric/Behavioral: Negative.        Objective:   Physical Exam  Vitals reviewed. Constitutional: She is oriented to person, place, and time. She appears well-developed and well-nourished. No distress.  HENT:  Head: Normocephalic and atraumatic.  Mouth/Throat: Oropharynx is clear and moist. No oropharyngeal exudate.  Eyes:  Conjunctivae are normal. Right eye exhibits no discharge. Left eye exhibits no discharge. No scleral icterus.  Neck: Normal range of motion. Neck supple. No JVD present. No tracheal deviation present. No thyromegaly present.  Cardiovascular: Normal rate, regular rhythm, normal heart sounds and intact distal pulses.  Exam reveals no gallop and no friction rub.   No murmur heard. Pulmonary/Chest: Effort normal and breath sounds normal. No stridor. No respiratory distress. She has no wheezes. She has no rales. She exhibits no tenderness.  Abdominal: Soft. Bowel sounds are normal. She exhibits no distension and no mass. There is no tenderness. There is no rebound and no guarding.  Musculoskeletal: Normal range of motion. She exhibits no edema and no tenderness.  Lymphadenopathy:    She has no cervical adenopathy.  Neurological: She is oriented to person, place, and time.  Skin: Skin is warm and dry. No rash noted. She is not diaphoretic. No erythema. No pallor.  Psychiatric: She has a normal mood and affect. Her behavior is normal. Judgment and thought content normal.     Lab Results  Component Value Date   WBC 10.9* 11/18/2012   HGB 7.3* 11/18/2012   HCT 25.8 Repeated and verified X2.* 11/18/2012   PLT 612.0* 11/18/2012   GLUCOSE 96 11/18/2012   CHOL 205* 09/24/2011   TRIG 87.0 09/24/2011   HDL 43.50 09/24/2011   LDLDIRECT 140.4 09/24/2011   ALT 24 09/14/2012   AST 29 09/14/2012   NA 137 11/18/2012   K 2.8* 11/18/2012   CL 101 11/18/2012   CREATININE 0.6 11/18/2012   BUN 11 11/18/2012   CO2 30 11/18/2012   TSH 1.18 09/14/2012   HGBA1C 6.3 09/14/2012  Assessment & Plan:

## 2012-11-21 NOTE — Patient Instructions (Addendum)
Iron Deficiency Anemia There are many types of anemia. Iron deficiency anemia is the most common. Iron deficiency anemia is a decrease in the number of red blood cells caused by too little iron. Without enough iron, your body does not produce enough hemoglobin. Hemoglobin is a substance in red blood cells that carries oxygen to the body's tissues. Iron deficiency anemia may leave you tired and short of breath. CAUSES   Lack of iron in the diet.  This may be seen in infants and children, because there is little iron in milk.  This may be seen in adults who do not eat enough iron-rich foods.  This may be seen in pregnant or breastfeeding women who do not take iron supplements. There is a much higher need for iron intake at these times.  Poor absorption of iron, as seen with intestinal disorders.  Intestinal bleeding.  Heavy periods. SYMPTOMS  Mild anemia may not be noticeable. Symptoms may include:  Fatigue.  Headache.  Pale skin.  Weakness.  Shortness of breath.  Dizziness.  Cold hands and feet.  Fast or irregular heartbeat. DIAGNOSIS  Diagnosis requires a thorough evaluation and physical exam by your caregiver.  Blood tests are generally used to confirm iron deficiency anemia.  Additional tests may be done to find the underlying cause of your anemia. These may include:  Testing for blood in the stool (fecal occult blood test).  A procedure to see inside the colon and rectum (colonoscopy).  A procedure to see inside the esophagus and stomach (endoscopy). TREATMENT   Correcting the cause of the iron deficiency is the first step.  Medicines, such as oral contraceptives, can make heavy menstrual flows lighter.  Antibiotics and other medicines can be used to treat peptic ulcers.  Surgery may be needed to remove a bleeding polyp, tumor, or fibroid.  Often, iron supplements (ferrous sulfate) are taken.  For the best iron absorption, take these supplements with an  empty stomach.  You may need to take the supplements with food if you cannot tolerate them on an empty stomach. Vitamin C improves the absorption of iron. Your caregiver may recommend taking your iron tablets with a glass of orange juice or vitamin C supplement.  Milk and antacids should not be taken at the same time as iron supplements. They may interfere with the absorption of iron.  Iron supplements can cause constipation. A stool softener is often recommended.  Pregnant and breastfeeding women will need to take extra iron, because their normal diet usually will not provide the required amount.  Patients who cannot tolerate iron by mouth can take it through a vein (intravenously) or by an injection into the muscle. HOME CARE INSTRUCTIONS   Ask your dietitian for help with diet questions.  Take iron and vitamins as directed by your caregiver.  Eat a diet rich in iron. Eat liver, lean beef, whole-grain bread, eggs, dried fruit, and dark green leafy vegetables. SEEK IMMEDIATE MEDICAL CARE IF:   You have a fainting episode. Do not drive yourself. Call your local emergency services (911 in [place] if no other help is available.  You have chest pain, nausea, or vomiting.  You develop severe or increased shortness of breath with activities.  You develop weakness or increased thirst.  You have a rapid heartbeat.  You develop unexplained sweating or become lightheaded when getting up from a chair or bed. MAKE SURE YOU:   Understand these instructions.  Will watch your condition.  Will get help right away   if you are not doing well or get worse. Document Released: 02/28/2000 Document Revised: 05/25/2011 Document Reviewed: 07/09/2009 [facility] Patient Information 2014 [place], [place]. Hypokalemia Hypokalemia means a low potassium level in the blood.Potassium is an electrolyte that helps regulate the amount of fluid in the body. It also stimulates muscle contraction and maintains a  stable acid-base balance.Most of the body's potassium is inside of cells, and only a very small amount is in the blood. Because the amount in the blood is so small, minor changes can have big effects. PREPARATION FOR TEST Testing for potassium requires taking a blood sample taken by needle from a vein in the arm. The skin is cleaned thoroughly before the sample is drawn. There is no other special preparation needed. NORMAL VALUES Potassium levels below 3.5 mEq/L are abnormally low. Levels above 5.1 mEq/L are abnormally high. Ranges for normal findings may vary among different laboratories and hospitals. You should always check with your doctor after having lab work or other tests done to discuss the meaning of your test results and whether your values are considered within normal limits. MEANING OF TEST  Your caregiver will go over the test results with you and discuss the importance and meaning of your results, as well as treatment options and the need for additional tests, if necessary. A potassium level is frequently part of a routine medical exam. It is usually included as part of a whole "panel" of tests for several blood salts (such as Sodium and Chloride). It may be done as part of follow-up when a low potassium level was found in the past or other blood salts are suspected of being out of balance. A low potassium level might be suspected if you have one or more of the following:  Symptoms of weakness.  Abnormal heart rhythms.  High blood pressure and are taking medication to control this, especially water pills (diuretics).  Kidney disease that can affect your potassium level .  Diabetes requiring the use of insulin. The potassium may fall after taking insulin, especially if the diabetes had been out of control for a while.  A condition requiring the use of cortisone-type medication or certain types of antibiotics.  Vomiting and/or diarrhea for more than a day or two.  A stomach or  intestinal condition that may not permit appropriate absorption of potassium.  Fainting episodes.  Mental confusion. OBTAINING TEST RESULTS It is your responsibility to obtain your test results. Ask the lab or department performing the test when and how you will get your results.  Please contact your caregiver directly if you have not received the results within one week. At that time, ask if there is anything different or new you should be doing in relation to the results. TREATMENT Hypokalemia can be treated with potassium supplements taken by mouth and/or adjustments in your current medications. A diet high in potassium is also helpful. Foods with high potassium content are:  Peas, lentils, lima beans, nuts, and dried fruit.  Whole grain and bran cereals and breads.  Fresh fruit, vegetables (bananas, cantaloupe, grapefruit, oranges, tomatoes, honeydew melons, potatoes).  Orange and tomato juices.  Meats. If potassium supplement has been prescribed for you today or your medications have been adjusted, see your personal caregiver in time02 for a re-check. SEEK MEDICAL CARE IF:  There is a feeling of worsening weakness.  You experience repeated chest palpitations.  You are diabetic and having difficulty keeping your blood sugars in the normal range.  You are experiencing vomiting  and/or diarrhea.  You are having difficulty with any of your regular medications. SEEK IMMEDIATE MEDICAL CARE IF:  You experience chest pain, shortness of breath, or episodes of dizziness.  You have been having vomiting or diarrhea for more than 2 days.  You have a fainting episode. MAKE SURE YOU:   Understand these instructions.  Will watch your condition.  Will get help right away if you are not doing well or get worse. Document Released: 03/02/2005 Document Revised: 05/25/2011 Document Reviewed: 02/11/2008 [facility] Patient Information 2014 [place], [place]. Hypertension As your heart  beats, it forces blood through your arteries. This force is your blood pressure. If the pressure is too high, it is called hypertension (HTN) or high blood pressure. HTN is dangerous because you may have it and not know it. High blood pressure may mean that your heart has to work harder to pump blood. Your arteries may be narrow or stiff. The extra work puts you at risk for heart disease, stroke, and other problems.  Blood pressure consists of two numbers, a higher number over a lower, 110/72, for example. It is stated as "110 over 72." The ideal is below 120 for the top number (systolic) and under 80 for the bottom (diastolic). Write down your blood pressure today. You should pay close attention to your blood pressure if you have certain conditions such as:  Heart failure.  Prior heart attack.  Diabetes  Chronic kidney disease.  Prior stroke.  Multiple risk factors for heart disease. To see if you have HTN, your blood pressure should be measured while you are seated with your arm held at the level of the heart. It should be measured at least twice. A one-time elevated blood pressure reading (especially in the Emergency Department) does not mean that you need treatment. There may be conditions in which the blood pressure is different between your right and left arms. It is important to see your caregiver soon for a recheck. Most people have essential hypertension which means that there is not a specific cause. This type of high blood pressure may be lowered by changing lifestyle factors such as:  Stress.  Smoking.  Lack of exercise.  Excessive weight.  Drug/tobacco/alcohol use.  Eating less salt. Most people do not have symptoms from high blood pressure until it has caused damage to the body. Effective treatment can often prevent, delay or reduce that damage. TREATMENT  When a cause has been identified, treatment for high blood pressure is directed at the cause. There are a large  number of medications to treat HTN. These fall into several categories, and your caregiver will help you select the medicines that are best for you. Medications may have side effects. You should review side effects with your caregiver. If your blood pressure stays high after you have made lifestyle changes or started on medicines,   Your medication(s) may need to be changed.  Other problems may need to be addressed.  Be certain you understand your prescriptions, and know how and when to take your medicine.  Be sure to follow up with your caregiver within the time frame advised (usually within two weeks) to have your blood pressure rechecked and to review your medications.  If you are taking more than one medicine to lower your blood pressure, make sure you know how and at what times they should be taken. Taking two medicines at the same time can result in blood pressure that is too low. SEEK IMMEDIATE MEDICAL CARE IF:  You develop a severe headache, blurred or changing vision, or confusion.  You have unusual weakness or numbness, or a faint feeling.  You have severe chest or abdominal pain, vomiting, or breathing problems. MAKE SURE YOU:   Understand these instructions.  Will watch your condition.  Will get help right away if you are not doing well or get worse. Document Released: 03/02/2005 Document Revised: 05/25/2011 Document Reviewed: 10/21/2007 [facility] Patient Information 2014 [place].

## 2012-11-21 NOTE — Assessment & Plan Note (Signed)
She was told one year ago that she has fibroids She will return to see GYN to have this treated

## 2012-11-21 NOTE — Assessment & Plan Note (Signed)
She will restart iron replacement therapy

## 2012-11-29 ENCOUNTER — Encounter: Payer: Self-pay | Admitting: Obstetrics & Gynecology

## 2012-11-30 ENCOUNTER — Ambulatory Visit: Payer: BC Managed Care – PPO | Admitting: Physical Therapy

## 2012-12-12 ENCOUNTER — Ambulatory Visit: Payer: BC Managed Care – PPO | Attending: Internal Medicine | Admitting: Physical Therapy

## 2012-12-12 DIAGNOSIS — M545 Low back pain, unspecified: Secondary | ICD-10-CM | POA: Insufficient documentation

## 2012-12-12 DIAGNOSIS — M79609 Pain in unspecified limb: Secondary | ICD-10-CM | POA: Insufficient documentation

## 2012-12-12 DIAGNOSIS — R293 Abnormal posture: Secondary | ICD-10-CM | POA: Insufficient documentation

## 2012-12-12 DIAGNOSIS — IMO0001 Reserved for inherently not codable concepts without codable children: Secondary | ICD-10-CM | POA: Insufficient documentation

## 2012-12-22 LAB — HM PAP SMEAR

## 2013-01-05 ENCOUNTER — Encounter: Payer: Self-pay | Admitting: Internal Medicine

## 2013-01-06 ENCOUNTER — Encounter: Payer: Self-pay | Admitting: Internal Medicine

## 2013-01-06 ENCOUNTER — Other Ambulatory Visit (INDEPENDENT_AMBULATORY_CARE_PROVIDER_SITE_OTHER): Payer: BC Managed Care – PPO

## 2013-01-06 ENCOUNTER — Ambulatory Visit (INDEPENDENT_AMBULATORY_CARE_PROVIDER_SITE_OTHER): Payer: BC Managed Care – PPO | Admitting: Internal Medicine

## 2013-01-06 VITALS — BP 136/92 | HR 87 | Temp 98.3°F | Resp 16 | Ht 62.0 in | Wt 262.2 lb

## 2013-01-06 DIAGNOSIS — E876 Hypokalemia: Secondary | ICD-10-CM

## 2013-01-06 DIAGNOSIS — IMO0001 Reserved for inherently not codable concepts without codable children: Secondary | ICD-10-CM

## 2013-01-06 DIAGNOSIS — I10 Essential (primary) hypertension: Secondary | ICD-10-CM

## 2013-01-06 DIAGNOSIS — E785 Hyperlipidemia, unspecified: Secondary | ICD-10-CM

## 2013-01-06 DIAGNOSIS — D509 Iron deficiency anemia, unspecified: Secondary | ICD-10-CM

## 2013-01-06 LAB — CBC WITH DIFFERENTIAL/PLATELET
Basophils Absolute: 0 10*3/uL (ref 0.0–0.1)
Basophils Relative: 0.1 % (ref 0.0–3.0)
Eosinophils Absolute: 0.1 10*3/uL (ref 0.0–0.7)
Eosinophils Relative: 2.1 % (ref 0.0–5.0)
HCT: 31.3 % — ABNORMAL LOW (ref 36.0–46.0)
Hemoglobin: 9.1 g/dL — ABNORMAL LOW (ref 12.0–15.0)
Lymphocytes Relative: 29.4 % (ref 12.0–46.0)
Lymphs Abs: 1.8 10*3/uL (ref 0.7–4.0)
MCHC: 29.2 g/dL — ABNORMAL LOW (ref 30.0–36.0)
MCV: 64.5 fl — ABNORMAL LOW (ref 78.0–100.0)
Monocytes Absolute: 0.5 10*3/uL (ref 0.1–1.0)
Monocytes Relative: 8.5 % (ref 3.0–12.0)
Neutro Abs: 3.7 10*3/uL (ref 1.4–7.7)
Neutrophils Relative %: 59.9 % (ref 43.0–77.0)
Platelets: 238 10*3/uL (ref 150.0–400.0)
RBC: 4.85 Mil/uL (ref 3.87–5.11)
RDW: 25.3 % — ABNORMAL HIGH (ref 11.5–14.6)
WBC: 6.1 10*3/uL (ref 4.5–10.5)

## 2013-01-06 LAB — IBC PANEL
Iron: 123 ug/dL (ref 42–145)
Saturation Ratios: 27.2 % (ref 20.0–50.0)
Transferrin: 323.1 mg/dL (ref 212.0–360.0)

## 2013-01-06 LAB — FERRITIN: Ferritin: 14.8 ng/mL (ref 10.0–291.0)

## 2013-01-06 LAB — BASIC METABOLIC PANEL
BUN: 8 mg/dL (ref 6–23)
CO2: 31 mEq/L (ref 19–32)
Calcium: 9.5 mg/dL (ref 8.4–10.5)
Chloride: 97 mEq/L (ref 96–112)
Creatinine, Ser: 0.7 mg/dL (ref 0.4–1.2)
GFR: 129.59 mL/min (ref >60.00)
Glucose, Bld: 111 mg/dL — ABNORMAL HIGH (ref 70–99)
Potassium: 2.6 mEq/L — CL (ref 3.5–5.1)
Sodium: 137 mEq/L (ref 135–145)

## 2013-01-06 LAB — LIPID PANEL
Cholesterol: 174 mg/dL (ref 0–200)
HDL: 39.7 mg/dL (ref >39.00)
LDL Cholesterol: 120 mg/dL — ABNORMAL HIGH (ref 0–99)
Total CHOL/HDL Ratio: 4
Triglycerides: 70 mg/dL (ref 0.0–149.0)
VLDL: 14 mg/dL (ref 0.0–40.0)

## 2013-01-06 LAB — TSH: TSH: 0.8 u[IU]/mL (ref 0.35–5.50)

## 2013-01-06 LAB — MAGNESIUM: Magnesium: 1.8 mg/dL (ref 1.5–2.5)

## 2013-01-06 LAB — HEMOGLOBIN A1C: Hgb A1c MFr Bld: 5.5 % (ref 4.6–6.5)

## 2013-01-06 MED ORDER — TRIAMTERENE-HCTZ 37.5-25 MG PO TABS: 1.0000 | ORAL_TABLET | Freq: Every day | ORAL | Status: DC

## 2013-01-06 NOTE — Assessment & Plan Note (Signed)
She is losing weight with diet and exercise

## 2013-01-06 NOTE — Progress Notes (Signed)
  Subjective:    Patient ID: Lindsay Gomez, female    DOB: 1970/08/26, 42 y.o.   MRN: [id number]  Hypertension This is a chronic problem. The current episode started more than 1 year ago. The problem is unchanged. The problem is uncontrolled. Associated symptoms include peripheral edema. Pertinent negatives include no anxiety, blurred vision, chest pain, headaches, malaise/fatigue, neck pain, orthopnea, palpitations, PND, shortness of breath or sweats. Agents associated with hypertension include NSAIDs. Past treatments include diuretics. The current treatment provides moderate improvement. Compliance problems include exercise and diet.       Review of Systems  Constitutional: Negative.  Negative for fever, chills, malaise/fatigue, diaphoresis, appetite change and fatigue.  HENT: Negative.   Eyes: Negative.  Negative for blurred vision.  Respiratory: Negative.  Negative for cough, chest tightness, shortness of breath, wheezing and stridor.   Cardiovascular: Negative.  Negative for chest pain, palpitations, orthopnea, leg swelling and PND.  Gastrointestinal: Negative.  Negative for nausea, vomiting, abdominal pain, diarrhea, constipation and blood in stool.  Endocrine: Negative.   Genitourinary: Negative.   Musculoskeletal: Negative.  Negative for joint swelling, myalgias and neck pain.  Skin: Negative.   Allergic/Immunologic: Negative.   Neurological: Negative.  Negative for dizziness, tremors, weakness, numbness and headaches.  Hematological: Negative.  Negative for adenopathy. Does not bruise/bleed easily.  Psychiatric/Behavioral: Negative.        Objective:   Physical Exam  Vitals reviewed. Constitutional: She is oriented to person, place, and time. She appears well-developed and well-nourished. No distress.  HENT:  Head: Normocephalic and atraumatic.  Mouth/Throat: Oropharynx is clear and moist. No oropharyngeal exudate.  Eyes: Conjunctivae are normal. Right eye exhibits no  discharge. Left eye exhibits no discharge. No scleral icterus.  Neck: Normal range of motion. Neck supple. No JVD present. No tracheal deviation present. No thyromegaly present.  Cardiovascular: Normal rate, regular rhythm, normal heart sounds and intact distal pulses.  Exam reveals no gallop and no friction rub.   No murmur heard. Pulmonary/Chest: Effort normal and breath sounds normal. No stridor. No respiratory distress. She has no wheezes. She has no rales. She exhibits no tenderness.  Abdominal: Soft. Bowel sounds are normal. She exhibits no distension and no mass. There is no tenderness. There is no rebound and no guarding.  Musculoskeletal: Normal range of motion. She exhibits edema (1+ pitting edema in BLE). She exhibits no tenderness.  Lymphadenopathy:    She has no cervical adenopathy.  Neurological: She is oriented to person, place, and time.  Skin: Skin is warm and dry. No rash noted. She is not diaphoretic. No erythema. No pallor.  Psychiatric: She has a normal mood and affect. Her behavior is normal. Judgment and thought content normal.     Lab Results  Component Value Date   WBC 10.9* 11/18/2012   HGB 7.3* 11/18/2012   HCT 25.8 Repeated and verified X2.* 11/18/2012   PLT 612.0* 11/18/2012   GLUCOSE 96 11/18/2012   CHOL 205* 09/24/2011   TRIG 87.0 09/24/2011   HDL 43.50 09/24/2011   LDLDIRECT 140.4 09/24/2011   ALT 24 09/14/2012   AST 29 09/14/2012   NA 137 11/18/2012   K 2.8* 11/18/2012   CL 101 11/18/2012   CREATININE 0.6 11/18/2012   BUN 11 11/18/2012   CO2 30 11/18/2012   TSH 1.18 09/14/2012   HGBA1C 6.3 09/14/2012       Assessment & Plan:

## 2013-01-06 NOTE — Patient Instructions (Signed)

## 2013-01-08 NOTE — Assessment & Plan Note (Signed)
Improvement noted 

## 2013-01-08 NOTE — Assessment & Plan Note (Signed)
Her K+ level remains low so I have asked her to change the HCTZ to Maxzide

## 2013-01-08 NOTE — Assessment & Plan Note (Signed)
Her BP is not well controlled and she has persistent edema and low K+ so I have changed her diuretic to maxzide

## 2013-01-08 NOTE — Assessment & Plan Note (Signed)
I will check her A1C and will address if needed 

## 2013-01-12 ENCOUNTER — Encounter: Payer: BC Managed Care – PPO | Admitting: Obstetrics & Gynecology

## 2013-01-13 ENCOUNTER — Ambulatory Visit: Payer: BC Managed Care – PPO | Admitting: Internal Medicine

## 2013-01-13 NOTE — Telephone Encounter (Signed)
Patient Information:  Caller Name: Rexine  Phone: [phone]  Patient: Lindsay Gomez, Lindsay Gomez  Gender: Female  DOB: [birth year]  Age: 42 Years  PCP: Sanda Linger (Adults only)  Pregnant: No  Office Follow Up:  Does the office need to follow up with this patient?: Yes  Instructions For The Office: Requesting different Iron RX- Ferrous Gluconate upsets her stomach.  RN Note:  She is going to be checked at [facility] ER- wants Abdominal US to check on sharp abdominal pains. She thinks pain is causing elevated BP. Refused appointment.  Symptoms  Reason For Call & Symptoms: Calling about and headache for past 3 days and has pain behind R eye today-01/13/13. Sharp pains in stomach may be from Fibroids and is nauseous since starting on Ferrous Gluconate 2 days ago. She did not take it today. Was suppposed to start on BCP for heavy bleeding after seen by GYN 2 weeks ago but she decided not to take it. Taking HCTZ for BP- last dose last night.  BP =200/100 @ 0850 01/13/13. She is feeling all over weakness and wants to go to [facility] to be checked. Offered appointment in the office but she refused.  Reviewed Health History In EMR: Yes  Reviewed Medications In EMR: Yes  Reviewed Allergies In EMR: Yes  Reviewed Surgeries / Procedures: Yes  Date of Onset of Symptoms: 01/09/2013 OB / GYN:  LMP: 01/09/2013  Guideline(s) Used:  High Blood Pressure  Headache  Disposition Per Guideline:   Go to ED Now (or to Office with PCP Approval)  Reason For Disposition Reached:   Patient sounds very sick or weak to the triager  Advice Given:  General:  Untreated high blood pressure may cause damage to the heart, brain, kidneys, and eyes.  Treatment of high blood pressure can reduce the risk of stroke, heart attack, and heart failure.  The goal of blood pressure treatment for most patients with hypertension is to keep the blood pressure under 140/90.  Call Back If:  Headache, blurred  vision, difficulty talking, or difficulty walking occurs  Chest pain or difficulty breathing occurs  You want to go in to the office for a blood pressure check  You become worse.  Call Back If:  Headache lasts longer than 24 hours  You become worse.  Patient Request:  Go To ED  She will f/u with the office

## 2013-01-19 ENCOUNTER — Other Ambulatory Visit: Payer: Self-pay

## 2013-04-06 ENCOUNTER — Ambulatory Visit (INDEPENDENT_AMBULATORY_CARE_PROVIDER_SITE_OTHER): Payer: BC Managed Care – PPO | Admitting: Internal Medicine

## 2013-04-06 ENCOUNTER — Encounter: Payer: Self-pay | Admitting: Internal Medicine

## 2013-04-06 ENCOUNTER — Other Ambulatory Visit (INDEPENDENT_AMBULATORY_CARE_PROVIDER_SITE_OTHER): Payer: BC Managed Care – PPO

## 2013-04-06 VITALS — BP 156/98 | HR 109 | Temp 99.3°F | Resp 16 | Ht 62.0 in | Wt 266.5 lb

## 2013-04-06 DIAGNOSIS — N920 Excessive and frequent menstruation with regular cycle: Secondary | ICD-10-CM

## 2013-04-06 DIAGNOSIS — I10 Essential (primary) hypertension: Secondary | ICD-10-CM

## 2013-04-06 DIAGNOSIS — D509 Iron deficiency anemia, unspecified: Secondary | ICD-10-CM

## 2013-04-06 DIAGNOSIS — E876 Hypokalemia: Secondary | ICD-10-CM

## 2013-04-06 DIAGNOSIS — J069 Acute upper respiratory infection, unspecified: Secondary | ICD-10-CM

## 2013-04-06 DIAGNOSIS — IMO0001 Reserved for inherently not codable concepts without codable children: Secondary | ICD-10-CM

## 2013-04-06 DIAGNOSIS — E1165 Type 2 diabetes mellitus with hyperglycemia: Secondary | ICD-10-CM

## 2013-04-06 DIAGNOSIS — I517 Cardiomegaly: Secondary | ICD-10-CM

## 2013-04-06 LAB — CBC WITH DIFFERENTIAL/PLATELET
Basophils Absolute: 0 10*3/uL (ref 0.0–0.1)
Basophils Relative: 0.1 % (ref 0.0–3.0)
Eosinophils Absolute: 0.1 10*3/uL (ref 0.0–0.7)
Eosinophils Relative: 1.2 % (ref 0.0–5.0)
HCT: 29.3 % — ABNORMAL LOW (ref 36.0–46.0)
Hemoglobin: 8.8 g/dL — ABNORMAL LOW (ref 12.0–15.0)
Lymphocytes Relative: 31.8 % (ref 12.0–46.0)
Lymphs Abs: 2.6 10*3/uL (ref 0.7–4.0)
MCHC: 29.9 g/dL — ABNORMAL LOW (ref 30.0–36.0)
MCV: 65.9 fl — ABNORMAL LOW (ref 78.0–100.0)
Monocytes Absolute: 0.6 10*3/uL (ref 0.1–1.0)
Monocytes Relative: 6.9 % (ref 3.0–12.0)
Neutro Abs: 4.9 10*3/uL (ref 1.4–7.7)
Neutrophils Relative %: 60 % (ref 43.0–77.0)
Platelets: 271 10*3/uL (ref 150.0–400.0)
RBC: 4.45 Mil/uL (ref 3.87–5.11)
RDW: 22.1 % — ABNORMAL HIGH (ref 11.5–14.6)
WBC: 8.1 10*3/uL (ref 4.5–10.5)

## 2013-04-06 LAB — FERRITIN: Ferritin: 8.8 ng/mL — ABNORMAL LOW (ref 10.0–291.0)

## 2013-04-06 MED ORDER — HYDROCODONE-HOMATROPINE 5-1.5 MG/5ML PO SYRP: 5.0000 mL | ORAL_SOLUTION | Freq: Three times a day (TID) | ORAL | Status: DC | PRN

## 2013-04-06 MED ORDER — LOSARTAN POTASSIUM 100 MG PO TABS: 100.0000 mg | ORAL_TABLET | Freq: Every day | ORAL | Status: DC

## 2013-04-06 MED ORDER — NEBIVOLOL HCL 5 MG PO TABS: 5.0000 mg | ORAL_TABLET | Freq: Every day | ORAL | Status: DC

## 2013-04-06 MED ORDER — TRIAMTERENE-HCTZ 37.5-25 MG PO TABS: 1.0000 | ORAL_TABLET | Freq: Every day | ORAL | Status: DC

## 2013-04-06 NOTE — Assessment & Plan Note (Signed)
Her BP is not well controlled - there has been confusion about what her BP regimen is so today I clarified with her that I want her to take maxzide, bystolic, and losartan Today I will check her lytes and renal function

## 2013-04-06 NOTE — Patient Instructions (Signed)
Upper Respiratory Infection, Adult An upper respiratory infection (URI) is also known as the common cold. It is often caused by a type of germ (virus). Colds are easily spread (contagious). You can pass it to others by kissing, coughing, sneezing, or drinking out of the same glass. Usually, you get better in 1 or 2 weeks.  HOME CARE   Only take medicine as told by your doctor.  Use a warm mist humidifier or breathe in steam from a hot shower.  Drink enough water and fluids to keep your pee (urine) clear or pale yellow.  Get plenty of rest.  Return to work when your temperature is back to normal or as told by your doctor. You may use a face mask and wash your hands to stop your cold from spreading. GET HELP RIGHT AWAY IF:   After the first few days, you feel you are getting worse.  You have questions about your medicine.  You have chills, shortness of breath, or brown or red spit (mucus).  You have yellow or brown snot (nasal discharge) or pain in the face, especially when you bend forward.  You have a fever, puffy (swollen) neck, pain when you swallow, or white spots in the back of your throat.  You have a bad headache, ear pain, sinus pain, or chest pain.  You have a high-pitched whistling sound when you breathe in and out (wheezing).  You have a lasting cough or cough up blood.  You have sore muscles or a stiff neck. MAKE SURE YOU:   Understand these instructions.  Will watch your condition.  Will get help right away if you are not doing well or get worse. Document Released: 08/19/2007 Document Revised: 05/25/2011 Document Reviewed: 07/07/2010 [facility] Patient Information 2014 [place], [place]. Hypertension As your heart beats, it forces blood through your arteries. This force is your blood pressure. If the pressure is too high, it is called hypertension (HTN) or high blood pressure. HTN is dangerous because you may have it and not know it. High blood pressure may mean that  your heart has to work harder to pump blood. Your arteries may be narrow or stiff. The extra work puts you at risk for heart disease, stroke, and other problems.  Blood pressure consists of two numbers, a higher number over a lower, 110/72, for example. It is stated as "110 over 72." The ideal is below 120 for the top number (systolic) and under 80 for the bottom (diastolic). Write down your blood pressure today. You should pay close attention to your blood pressure if you have certain conditions such as:  Heart failure.  Prior heart attack.  Diabetes  Chronic kidney disease.  Prior stroke.  Multiple risk factors for heart disease. To see if you have HTN, your blood pressure should be measured while you are seated with your arm held at the level of the heart. It should be measured at least twice. A one-time elevated blood pressure reading (especially in the Emergency Department) does not mean that you need treatment. There may be conditions in which the blood pressure is different between your right and left arms. It is important to see your caregiver soon for a recheck. Most people have essential hypertension which means that there is not a specific cause. This type of high blood pressure may be lowered by changing lifestyle factors such as:  Stress.  Smoking.  Lack of exercise.  Excessive weight.  Drug/tobacco/alcohol use.  Eating less salt. Most people do not  have symptoms from high blood pressure until it has caused damage to the body. Effective treatment can often prevent, delay or reduce that damage. TREATMENT  When a cause has been identified, treatment for high blood pressure is directed at the cause. There are a large number of medications to treat HTN. These fall into several categories, and your caregiver will help you select the medicines that are best for you. Medications may have side effects. You should review side effects with your caregiver. If your blood pressure  stays high after you have made lifestyle changes or started on medicines,   Your medication(s) may need to be changed.  Other problems may need to be addressed.  Be certain you understand your prescriptions, and know how and when to take your medicine.  Be sure to follow up with your caregiver within the time frame advised (usually within two weeks) to have your blood pressure rechecked and to review your medications.  If you are taking more than one medicine to lower your blood pressure, make sure you know how and at what times they should be taken. Taking two medicines at the same time can result in blood pressure that is too low. SEEK IMMEDIATE MEDICAL CARE IF:  You develop a severe headache, blurred or changing vision, or confusion.  You have unusual weakness or numbness, or a faint feeling.  You have severe chest or abdominal pain, vomiting, or breathing problems. MAKE SURE YOU:   Understand these instructions.  Will watch your condition.  Will get help right away if you are not doing well or get worse. Document Released: 03/02/2005 Document Revised: 05/25/2011 Document Reviewed: 10/21/2007 [facility] Patient Information 2014 [place].

## 2013-04-06 NOTE — Progress Notes (Signed)
Pre visit review using our clinic review tool, if applicable. No additional management support is needed unless otherwise documented below in the visit note. 

## 2013-04-06 NOTE — Assessment & Plan Note (Addendum)
This is viral so antibiotics were not prescribed She will try hycodan for symptom relief

## 2013-04-06 NOTE — Assessment & Plan Note (Signed)
I have asked her to restart the maxzide and today I will check her Mg++ and K+ levels

## 2013-04-06 NOTE — Assessment & Plan Note (Signed)
I will recheck her CBC and iron level She is seeing GYN about the long, heavy periods

## 2013-04-06 NOTE — Progress Notes (Signed)
Subjective:    Patient ID: Lindsay Gomez, female    DOB: 02/20/1971, 43 y.o.   MRN: [id number]  URI  This is a new problem. The current episode started in the past 7 days. The problem has been unchanged. There has been no fever. The fever has been present for less than 1 day. Associated symptoms include coughing (mild NP), headaches and a sore throat. Pertinent negatives include no abdominal pain, chest pain, congestion, diarrhea, dysuria, ear pain, joint pain, joint swelling, nausea, neck pain, plugged ear sensation, rash, rhinorrhea, sinus pain, sneezing, swollen glands, vomiting or wheezing. She has tried nothing for the symptoms. The treatment provided no relief.      Review of Systems  Constitutional: Negative.  Negative for fever, chills, diaphoresis, activity change, appetite change, fatigue and unexpected weight change.  HENT: Positive for sore throat. Negative for congestion, ear pain, rhinorrhea, sinus pressure, sneezing, tinnitus, trouble swallowing and voice change.   Eyes: Negative.   Respiratory: Positive for cough (mild NP). Negative for apnea, choking, chest tightness, shortness of breath, wheezing and stridor.   Cardiovascular: Negative.  Negative for chest pain, palpitations and leg swelling.  Gastrointestinal: Negative.  Negative for nausea, vomiting, abdominal pain, diarrhea, constipation, blood in stool, abdominal distention, anal bleeding and rectal pain.  Endocrine: Negative.   Genitourinary: Positive for vaginal bleeding (long, heavy periods). Negative for dysuria, frequency, hematuria, flank pain, vaginal discharge and vaginal pain.  Musculoskeletal: Negative.  Negative for joint pain and neck pain.  Skin: Negative.  Negative for rash.  Allergic/Immunologic: Negative.   Neurological: Positive for headaches. Negative for dizziness, tremors, seizures, syncope, facial asymmetry, speech difficulty, weakness, light-headedness and numbness.  Hematological: Negative.   Negative for adenopathy.  Psychiatric/Behavioral: Negative.        Objective:   Physical Exam  Vitals reviewed. Constitutional: She is oriented to person, place, and time. She appears well-developed and well-nourished.  Non-toxic appearance. She does not have a sickly appearance. She does not appear ill. No distress.  HENT:  Head: Normocephalic and atraumatic.  Mouth/Throat: Oropharynx is clear and moist. No oropharyngeal exudate.  Eyes: Conjunctivae are normal. Right eye exhibits no discharge. Left eye exhibits no discharge. No scleral icterus.  Neck: Normal range of motion. Neck supple. No JVD present. No tracheal deviation present. No thyromegaly present.  Cardiovascular: Normal rate, regular rhythm, normal heart sounds and intact distal pulses.  Exam reveals no gallop and no friction rub.   No murmur heard. Pulmonary/Chest: Effort normal and breath sounds normal. No stridor. No respiratory distress. She has no wheezes. She has no rales. She exhibits no tenderness.  Abdominal: Soft. Bowel sounds are normal. She exhibits no distension and no mass. There is no tenderness. There is no rebound and no guarding.  Musculoskeletal: Normal range of motion. She exhibits no edema and no tenderness.  Lymphadenopathy:    She has no cervical adenopathy.  Neurological: She is oriented to person, place, and time.  Skin: Skin is warm and dry. No rash noted. She is not diaphoretic. No erythema. No pallor.  Psychiatric: She has a normal mood and affect. Her behavior is normal. Judgment and thought content normal.     Lab Results  Component Value Date   WBC 6.1 01/06/2013   HGB 9.1* 01/06/2013   HCT 31.3* 01/06/2013   PLT 238.0 01/06/2013   GLUCOSE 111* 01/06/2013   CHOL 174 01/06/2013   TRIG 70.0 01/06/2013   HDL 39.70 01/06/2013   LDLDIRECT 140.4 09/24/2011   LDLCALC  120* 01/06/2013   ALT 24 09/14/2012   AST 29 09/14/2012   NA 137 01/06/2013   K 2.6* 01/06/2013   CL 97 01/06/2013   CREATININE  0.7 01/06/2013   BUN 8 01/06/2013   CO2 31 01/06/2013   TSH 0.80 01/06/2013   HGBA1C 5.5 01/06/2013       Assessment & Plan:

## 2013-04-06 NOTE — Assessment & Plan Note (Signed)
She needs better BP control

## 2013-04-06 NOTE — Assessment & Plan Note (Signed)
GYN has seen and plans to do a procedure to treat this

## 2013-04-07 ENCOUNTER — Encounter: Payer: Self-pay | Admitting: Internal Medicine

## 2013-04-07 LAB — BASIC METABOLIC PANEL
BUN: 9 mg/dL (ref 6–23)
CO2: 29 mEq/L (ref 19–32)
Calcium: 8.7 mg/dL (ref 8.4–10.5)
Chloride: 100 mEq/L (ref 96–112)
Creatinine, Ser: 0.7 mg/dL (ref 0.4–1.2)
GFR: 111.45 mL/min (ref >60.00)
Glucose, Bld: 137 mg/dL — ABNORMAL HIGH (ref 70–99)
Potassium: 2.8 mEq/L — CL (ref 3.5–5.1)
Sodium: 137 mEq/L (ref 135–145)

## 2013-04-07 LAB — IBC PANEL
Iron: 11 ug/dL — ABNORMAL LOW (ref 42–145)
Saturation Ratios: 2.5 % — ABNORMAL LOW (ref 20.0–50.0)
Transferrin: 313.2 mg/dL (ref 212.0–360.0)

## 2013-04-07 LAB — MAGNESIUM: Magnesium: 1.7 mg/dL (ref 1.5–2.5)

## 2013-06-09 ENCOUNTER — Ambulatory Visit: Payer: BC Managed Care – PPO | Admitting: Internal Medicine

## 2013-06-12 ENCOUNTER — Ambulatory Visit: Payer: BC Managed Care – PPO | Admitting: Internal Medicine

## 2013-06-12 ENCOUNTER — Encounter (HOSPITAL_COMMUNITY): Payer: Self-pay | Admitting: Emergency Medicine

## 2013-06-12 ENCOUNTER — Emergency Department (HOSPITAL_COMMUNITY)
Admission: EM | Admit: 2013-06-12 | Discharge: 2013-06-12 | Disposition: A | Discharge status: Home / Self Care | Payer: BC Managed Care – PPO | Attending: Emergency Medicine | Admitting: Emergency Medicine

## 2013-06-12 DIAGNOSIS — R0989 Other specified symptoms and signs involving the circulatory and respiratory systems: Secondary | ICD-10-CM | POA: Insufficient documentation

## 2013-06-12 DIAGNOSIS — R0602 Shortness of breath: Secondary | ICD-10-CM | POA: Insufficient documentation

## 2013-06-12 DIAGNOSIS — R51 Headache: Secondary | ICD-10-CM | POA: Insufficient documentation

## 2013-06-12 DIAGNOSIS — H538 Other visual disturbances: Secondary | ICD-10-CM | POA: Insufficient documentation

## 2013-06-12 DIAGNOSIS — R519 Headache, unspecified: Secondary | ICD-10-CM

## 2013-06-12 DIAGNOSIS — M545 Low back pain, unspecified: Secondary | ICD-10-CM | POA: Insufficient documentation

## 2013-06-12 DIAGNOSIS — Z862 Personal history of diseases of the blood and blood-forming organs and certain disorders involving the immune mechanism: Secondary | ICD-10-CM | POA: Insufficient documentation

## 2013-06-12 DIAGNOSIS — Z79899 Other long term (current) drug therapy: Secondary | ICD-10-CM | POA: Insufficient documentation

## 2013-06-12 DIAGNOSIS — R0609 Other forms of dyspnea: Secondary | ICD-10-CM | POA: Insufficient documentation

## 2013-06-12 DIAGNOSIS — Z3202 Encounter for pregnancy test, result negative: Secondary | ICD-10-CM | POA: Insufficient documentation

## 2013-06-12 DIAGNOSIS — I10 Essential (primary) hypertension: Secondary | ICD-10-CM | POA: Insufficient documentation

## 2013-06-12 LAB — I-STAT CHEM 8, ED
BUN: 8 mg/dL (ref 6–23)
Calcium, Ion: 1.09 mmol/L — ABNORMAL LOW (ref 1.12–1.23)
Chloride: 102 mEq/L (ref 96–112)
Creatinine, Ser: 0.7 mg/dL (ref 0.50–1.10)
Glucose, Bld: 115 mg/dL — ABNORMAL HIGH (ref 70–99)
HCT: 31 % — ABNORMAL LOW (ref 36.0–46.0)
Hemoglobin: 10.5 g/dL — ABNORMAL LOW (ref 12.0–15.0)
Potassium: 3.4 mEq/L — ABNORMAL LOW (ref 3.7–5.3)
Sodium: 140 mEq/L (ref 137–147)
TCO2: 24 mmol/L (ref 0–100)

## 2013-06-12 LAB — URINALYSIS, ROUTINE W REFLEX MICROSCOPIC
Bilirubin Urine: NEGATIVE
Glucose, UA: NEGATIVE mg/dL
Ketones, ur: NEGATIVE mg/dL
Leukocytes, UA: NEGATIVE
Nitrite: NEGATIVE
Protein, ur: NEGATIVE mg/dL
Specific Gravity, Urine: 1.021 (ref 1.005–1.030)
Urobilinogen, UA: 0.2 mg/dL (ref 0.0–1.0)
pH: 6.5 (ref 5.0–8.0)

## 2013-06-12 LAB — URINE MICROSCOPIC-ADD ON

## 2013-06-12 LAB — PREGNANCY, URINE: Preg Test, Ur: NEGATIVE

## 2013-06-12 MED ORDER — SODIUM CHLORIDE 0.9 % IV BOLUS (SEPSIS)
1000.0000 mL | Freq: Once | INTRAVENOUS | Status: AC
  Administered 2013-06-12: 1000 mL via INTRAVENOUS

## 2013-06-12 MED ORDER — KETOROLAC TROMETHAMINE 30 MG/ML IJ SOLN
30.0000 mg | Freq: Once | INTRAMUSCULAR | Status: AC
  Administered 2013-06-12: 30 mg via INTRAVENOUS
  Filled 2013-06-12: qty 1

## 2013-06-12 MED ORDER — METOCLOPRAMIDE HCL 5 MG/ML IJ SOLN
10.0000 mg | Freq: Once | INTRAMUSCULAR | Status: AC
  Administered 2013-06-12: 10 mg via INTRAVENOUS
  Filled 2013-06-12: qty 2

## 2013-06-12 NOTE — Discharge Instructions (Signed)
REturn here as needed. Follow up with your PCP today as scheduled.

## 2013-06-12 NOTE — ED Notes (Signed)
Pt c/o of headache and blurred vision, also c/o of SOB upon exertion. Denies chest pain.

## 2013-06-12 NOTE — ED Provider Notes (Signed)
CSN: [id number]     Arrival date & time 06/12/13  1047 History   First MD Initiated Contact with Patient 06/12/13 1153     Chief Complaint  Patient presents with  . Headache  . Blurred Vision  . Shortness of Breath     (Consider location/radiation/quality/duration/timing/severity/associated sxs/prior Treatment) HPI: Lindsay Gomez is a 43 year old woman with past medical history of hypertension who presents to the Emergency Department with chief complaint of headaches, blurred vision, and dyspnea on exertion.  She explains that she woke up with blurred vision this morning, as well as "seeing circles."  This cleared not long after, but then she got a headache which was located behind her right eye and has spread to her bilateral temples.  She has various other complaints, including left leg heaviness, "annoying" stomach discomfort, and low back pain. She had one episode of dizziness on Wednesday.  She denies chest pain, palpitations, nausea, vomiting, changes in bowel and bladder habits, photophobia, and phonophobia.  She does note that she self-discontinued her bystolic until this past Wednesday, when her OBGYN found that her blood pressure was so high that she recommended she stay out of work on Thursday.  Lindsay Gomez reports a lot of stress at work and at home; she tried to work on paperwork throughout the interview and becomes tearful when asked about stress levels.  She has not seen her ophthalmologist in 2 years.  She is scheduled for an appointment to discuss her blood pressure with her PCP today at 4pm.    Past Medical History  Diagnosis Date  . Anemia   . Hypertension   . Allergy     rhinitis  . Morbid obesity    Past Surgical History  Procedure Laterality Date  . Knee surgery  1992    ? arthroscopic/ right   Family History  Problem Relation Age of Onset  . Hypertension Mother   . Coronary artery disease Father   . Heart failure Father   . Heart attack Father   . Hypertension Other    . Hyperlipidemia Other    History  Substance Use Topics  . Smoking status: Never Smoker   . Smokeless tobacco: Never Used  . Alcohol Use: No   OB History   Grav Para Term Preterm Abortions TAB SAB Ect Mult Living                 Review of Systems  All other systems negative except as documented in the HPI. All pertinent positives and negatives as reviewed in the HPI.   Allergies  Lisinopril  Home Medications   Current Outpatient Rx  Name  Route  Sig  Dispense  Refill  . Biotin 5 MG TABS   Oral   Take 1 tablet by mouth daily.         . Flaxseed, Linseed, (FLAX SEED OIL) 1000 MG CAPS   Oral   Take 1 capsule by mouth daily.           Marland Kitchen ibuprofen (ADVIL,MOTRIN) 200 MG tablet   Oral   Take 200 mg by mouth every 6 (six) hours as needed.         . nebivolol (BYSTOLIC) 5 MG tablet   Oral   Take 1 tablet (5 mg total) by mouth daily.   70 tablet   0    BP 186/110  Pulse 78  Temp(Src) 98.7 F (37.1 C) (Oral)  Resp 23  SpO2 100% Physical Exam  Nursing  note and vitals reviewed. Constitutional: She is oriented to person, place, and time. She appears well-developed and well-nourished. No distress.  Patient is tearfully working on a Oncologist of papers during interview  HENT:  Head: Normocephalic and atraumatic.  Mouth/Throat: Oropharynx is clear and moist.  Eyes: Conjunctivae and EOM are normal. Pupils are equal, round, and reactive to light.  OS 20/20 OD 20/40  Neck: Normal range of motion. Neck supple.  Cardiovascular: Normal rate, regular rhythm, normal heart sounds and intact distal pulses.   Pedal pulses bounding  Pulmonary/Chest: Effort normal and breath sounds normal. No respiratory distress.  Abdominal: Soft. Bowel sounds are normal. She exhibits no distension. There is no tenderness. There is no guarding.  Musculoskeletal: She exhibits tenderness. She exhibits no edema.  Slight tenderness to palpation above left knee; no erythema, bruising, or  edema noted  Neurological: She is alert and oriented to person, place, and time. She exhibits normal muscle tone. Coordination normal.  Skin: Skin is warm and dry. No rash noted. No erythema.  Psychiatric:  Tearful    ED Course  Procedures (including critical care time) Labs Review Labs Reviewed  I-STAT CHEM 8, ED - Abnormal; Notable for the following:    Potassium 3.4 (*)    Glucose, Bld 115 (*)    Calcium, Ion 1.09 (*)    Hemoglobin 10.5 (*)    HCT 31.0 (*)    All other components within normal limits  PREGNANCY, URINE  URINALYSIS, ROUTINE W REFLEX MICROSCOPIC    The patient has multiple factors that I feel are causing her symptoms. The patient does have elevated BP but states that her stress levels are very high. I feel that the patient is not showing at this point signs of significant end organ damage or hypertensive crisis. The patient has an appt with her PCP today. I advised her to return here as needed. Told to increase her fluid intake. Tylenol for any further headache.     Brent General, PA-C 06/12/13 1350

## 2013-06-12 NOTE — ED Provider Notes (Signed)
Medical screening examination/treatment/procedure(s) were performed by non-physician practitioner and as supervising physician I was immediately available for consultation/collaboration.   EKG Interpretation None        Mirna Mires, MD 06/12/13 1843

## 2013-06-23 ENCOUNTER — Ambulatory Visit (INDEPENDENT_AMBULATORY_CARE_PROVIDER_SITE_OTHER): Payer: BC Managed Care – PPO | Admitting: Internal Medicine

## 2013-06-23 ENCOUNTER — Other Ambulatory Visit (INDEPENDENT_AMBULATORY_CARE_PROVIDER_SITE_OTHER): Payer: BC Managed Care – PPO

## 2013-06-23 ENCOUNTER — Encounter: Payer: Self-pay | Admitting: Internal Medicine

## 2013-06-23 VITALS — BP 140/82 | HR 90 | Temp 98.6°F | Resp 16 | Ht 62.0 in | Wt 273.0 lb

## 2013-06-23 DIAGNOSIS — E876 Hypokalemia: Secondary | ICD-10-CM

## 2013-06-23 DIAGNOSIS — I10 Essential (primary) hypertension: Secondary | ICD-10-CM

## 2013-06-23 DIAGNOSIS — I517 Cardiomegaly: Secondary | ICD-10-CM

## 2013-06-23 DIAGNOSIS — D509 Iron deficiency anemia, unspecified: Secondary | ICD-10-CM

## 2013-06-23 DIAGNOSIS — R079 Chest pain, unspecified: Secondary | ICD-10-CM

## 2013-06-23 LAB — BASIC METABOLIC PANEL
BUN: 8 mg/dL (ref 6–23)
CO2: 29 mEq/L (ref 19–32)
Calcium: 9 mg/dL (ref 8.4–10.5)
Chloride: 103 mEq/L (ref 96–112)
Creatinine, Ser: 0.6 mg/dL (ref 0.4–1.2)
GFR: 156.79 mL/min (ref >60.00)
Glucose, Bld: 102 mg/dL — ABNORMAL HIGH (ref 70–99)
Potassium: 4.2 mEq/L (ref 3.5–5.1)
Sodium: 136 mEq/L (ref 135–145)

## 2013-06-23 LAB — CBC WITH DIFFERENTIAL/PLATELET
Basophils Relative: 0 % (ref 0.0–3.0)
Eosinophils Relative: 0 % (ref 0.0–5.0)
HCT: 28.8 % — ABNORMAL LOW (ref 36.0–46.0)
Hemoglobin: 8.2 g/dL — ABNORMAL LOW (ref 12.0–15.0)
Lymphocytes Relative: 31 % (ref 12.0–46.0)
MCHC: 28.6 g/dL — ABNORMAL LOW (ref 30.0–36.0)
MCV: 62.1 fl — ABNORMAL LOW (ref 78.0–100.0)
Monocytes Relative: 2 % — ABNORMAL LOW (ref 3.0–12.0)
Neutrophils Relative %: 67 % (ref 43.0–77.0)
Platelets: 201 10*3/uL (ref 150.0–400.0)
RBC: 4.63 Mil/uL (ref 3.87–5.11)
RDW: 20.8 % — ABNORMAL HIGH (ref 11.5–14.6)
WBC: 8.6 10*3/uL (ref 4.5–10.5)

## 2013-06-23 LAB — D-DIMER, QUANTITATIVE: D-Dimer, Quant: 0.46 ug/mL-FEU (ref 0.00–0.48)

## 2013-06-23 LAB — IBC PANEL
Iron: 18 ug/dL — ABNORMAL LOW (ref 42–145)
Saturation Ratios: 3.7 % — ABNORMAL LOW (ref 20.0–50.0)
Transferrin: 347.1 mg/dL (ref 212.0–360.0)

## 2013-06-23 LAB — FERRITIN: Ferritin: 5.5 ng/mL — ABNORMAL LOW (ref 10.0–291.0)

## 2013-06-23 LAB — MAGNESIUM: Magnesium: 1.9 mg/dL (ref 1.5–2.5)

## 2013-06-23 LAB — CARDIAC PANEL
CK-MB: 1 ng/mL (ref 0.3–4.0)
Relative Index: 1.3 calc (ref 0.0–2.5)
Total CK: 76 U/L (ref 7–177)

## 2013-06-23 LAB — FOLATE: Folate: 7.8 ng/mL (ref >5.9)

## 2013-06-23 LAB — VITAMIN B12: Vitamin B-12: 437 pg/mL (ref 211–911)

## 2013-06-23 LAB — BRAIN NATRIURETIC PEPTIDE: Pro B Natriuretic peptide (BNP): 211 pg/mL — ABNORMAL HIGH (ref 0.0–100.0)

## 2013-06-23 LAB — TROPONIN I: Troponin I: 0.01 ng/mL (ref <0.06)

## 2013-06-23 MED ORDER — FERRALET 90 90-1 MG PO TABS: 1.0000 | ORAL_TABLET | Freq: Every day | ORAL | Status: DC

## 2013-06-23 MED ORDER — LOSARTAN POTASSIUM 100 MG PO TABS: 100.0000 mg | ORAL_TABLET | Freq: Every day | ORAL | Status: DC

## 2013-06-23 NOTE — Patient Instructions (Signed)
Chest Pain (Nonspecific) °It is often hard to give a specific diagnosis for the cause of chest pain. There is always a chance that your pain could be related to something serious, such as a heart attack or a blood clot in the lungs. You need to follow up with your caregiver for further evaluation. °CAUSES  °· Heartburn. °· Pneumonia or bronchitis. °· Anxiety or stress. °· Inflammation around your heart (pericarditis) or lung (pleuritis or pleurisy). °· A blood clot in the lung. °· A collapsed lung (pneumothorax). It can develop suddenly on its own (spontaneous pneumothorax) or from injury (trauma) to the chest. °· Shingles infection (herpes zoster virus). °The chest wall is composed of bones, muscles, and cartilage. Any of these can be the source of the pain. °· The bones can be bruised by injury. °· The muscles or cartilage can be strained by coughing or overwork. °· The cartilage can be affected by inflammation and become sore (costochondritis). °DIAGNOSIS  °Lab tests or other studies, such as X-rays, electrocardiography, stress testing, or cardiac imaging, may be needed to find the cause of your pain.  °TREATMENT  °· Treatment depends on what may be causing your chest pain. Treatment may include: °· Acid blockers for heartburn. °· Anti-inflammatory medicine. °· Pain medicine for inflammatory conditions. °· Antibiotics if an infection is present. °· You may be advised to change lifestyle habits. This includes stopping smoking and avoiding alcohol, caffeine, and chocolate. °· You may be advised to keep your head raised (elevated) when sleeping. This reduces the chance of acid going backward from your stomach into your esophagus. °· Most of the time, nonspecific chest pain will improve within 2 to 3 days with rest and mild pain medicine. °HOME CARE INSTRUCTIONS  °· If antibiotics were prescribed, take your antibiotics as directed. Finish them even if you start to feel better. °· For the next few days, avoid physical  activities that bring on chest pain. Continue physical activities as directed. °· Do not smoke. °· Avoid drinking alcohol. °· Only take over-the-counter or prescription medicine for pain, discomfort, or fever as directed by your caregiver. °· Follow your caregiver's suggestions for further testing if your chest pain does not go away. °· Keep any follow-up appointments you made. If you do not go to an appointment, you could develop lasting (chronic) problems with pain. If there is any problem keeping an appointment, you must call to reschedule. °SEEK MEDICAL CARE IF:  °· You think you are having problems from the medicine you are taking. Read your medicine instructions carefully. °· Your chest pain does not go away, even after treatment. °· You develop a rash with blisters on your chest. °SEEK IMMEDIATE MEDICAL CARE IF:  °· You have increased chest pain or pain that spreads to your arm, neck, jaw, back, or abdomen. °· You develop shortness of breath, an increasing cough, or you are coughing up blood. °· You have severe back or abdominal pain, feel nauseous, or vomit. °· You develop severe weakness, fainting, or chills. °· You have a fever. °THIS IS AN EMERGENCY. Do not wait to see if the pain will go away. Get medical help at once. Call your local emergency services (911 in [place] Do not drive yourself to the hospital. °MAKE SURE YOU:  °· Understand these instructions. °· Will watch your condition. °· Will get help right away if you are not doing well or get worse. °Document Released: 12/10/2004 Document Revised: 05/25/2011 Document Reviewed: 10/06/2007 °[employer]® Patient Information ©2014 [employer]   [employer]. ° °

## 2013-06-23 NOTE — Assessment & Plan Note (Signed)
Her BP is adequately well controlled I will recheck her lytes and renal function today

## 2013-06-23 NOTE — Progress Notes (Signed)
Subjective:    Patient ID: Lindsay Gomez, female    DOB: [birth year], 42 y.o.   MRN: [id number]  Chest Pain  This is a new problem. The current episode started in the past 7 days. The onset quality is gradual. The problem occurs intermittently. The problem has been resolved. The pain is present in the lateral region (left side). The pain is at a severity of 2/10. The pain is mild. The quality of the pain is described as sharp. The pain does not radiate. Pertinent negatives include no abdominal pain, back pain, claudication, cough, diaphoresis, dizziness, exertional chest pressure, fever, headaches, hemoptysis, irregular heartbeat, leg pain, lower extremity edema, malaise/fatigue, nausea, near-syncope, numbness, orthopnea, palpitations, PND, shortness of breath, sputum production, syncope, vomiting or weakness. She has tried nothing for the symptoms. Risk factors include sedentary lifestyle, lack of exercise and obesity.      Review of Systems  Constitutional: Positive for unexpected weight change (wt gain). Negative for fever, chills, malaise/fatigue, diaphoresis, activity change, appetite change and fatigue.  HENT: Negative.  Negative for sinus pressure.   Eyes: Negative.   Respiratory: Negative.  Negative for cough, hemoptysis, sputum production, choking, chest tightness, shortness of breath, wheezing and stridor.   Cardiovascular: Positive for chest pain. Negative for palpitations, orthopnea, claudication, leg swelling, syncope, PND and near-syncope.  Gastrointestinal: Negative.  Negative for nausea, vomiting, abdominal pain, diarrhea, constipation and blood in stool.  Endocrine: Negative.   Genitourinary: Negative.  Negative for dysuria, hematuria, enuresis and difficulty urinating.  Musculoskeletal: Negative.  Negative for arthralgias, back pain, joint swelling, myalgias, neck pain and neck stiffness.  Skin: Negative.   Allergic/Immunologic: Negative.   Neurological: Negative.   Negative for dizziness, weakness, numbness and headaches.  Hematological: Negative.  Negative for adenopathy. Does not bruise/bleed easily.  Psychiatric/Behavioral: Negative.        Objective:   Physical Exam  Vitals reviewed. Constitutional: She is oriented to person, place, and time. She appears well-developed and well-nourished. No distress.  HENT:  Head: Normocephalic and atraumatic.  Mouth/Throat: Oropharynx is clear and moist. No oropharyngeal exudate.  Eyes: Conjunctivae are normal. Right eye exhibits no discharge. Left eye exhibits no discharge. No scleral icterus.  Neck: Normal range of motion. Neck supple. No JVD present. No tracheal deviation present. No thyromegaly present.  Cardiovascular: Normal rate, regular rhythm, normal heart sounds and intact distal pulses.  Exam reveals no gallop and no friction rub.   No murmur heard. Pulmonary/Chest: Effort normal and breath sounds normal. No accessory muscle usage or stridor. Not tachypneic. No respiratory distress. She has no decreased breath sounds. She has no wheezes. She has no rhonchi. She has no rales. She exhibits no tenderness.  Abdominal: Soft. Bowel sounds are normal. She exhibits no distension and no mass. There is no tenderness. There is no rebound and no guarding.  Musculoskeletal: Normal range of motion. She exhibits no edema and no tenderness.  Lymphadenopathy:    She has no cervical adenopathy.  Neurological: She is oriented to person, place, and time.  Skin: Skin is warm and dry. No rash noted. She is not diaphoretic. No erythema. No pallor.  Psychiatric: She has a normal mood and affect. Her behavior is normal. Judgment and thought content normal.     Lab Results  Component Value Date   WBC 8.1 04/06/2013   HGB 10.5* 06/12/2013   HCT 31.0* 06/12/2013   PLT 271.0 04/06/2013   GLUCOSE 115* 06/12/2013   CHOL 174 01/06/2013   TRIG 70.0 01/06/2013  HDL 39.70 01/06/2013   LDLDIRECT 140.4 09/24/2011   LDLCALC 120*  01/06/2013   ALT 24 09/14/2012   AST 29 09/14/2012   NA 140 06/12/2013   K 3.4* 06/12/2013   CL 102 06/12/2013   CREATININE 0.70 06/12/2013   BUN 8 06/12/2013   CO2 29 04/06/2013   TSH 0.80 01/06/2013   HGBA1C 5.5 01/06/2013       Assessment & Plan:

## 2013-06-23 NOTE — Assessment & Plan Note (Signed)
I will check her BNP today to see if she is developing CHF

## 2013-06-23 NOTE — Assessment & Plan Note (Addendum)
Her pain does not sound cardiac in that it was at rest, sharp, and has resolved Her EKG is unchanged from prior EKG's - there is LVH but no q waves and no acute st/t wave changes I will order labs today to screen for ischemia and PE

## 2013-06-23 NOTE — Assessment & Plan Note (Signed)
I will recheck her CBC and iron level today She has not been taking iron so I have asked her to start ferralet

## 2013-06-23 NOTE — Assessment & Plan Note (Signed)
I will recheck her K+ level today 

## 2013-06-25 ENCOUNTER — Encounter: Payer: Self-pay | Admitting: Internal Medicine

## 2013-06-26 ENCOUNTER — Telehealth: Payer: Self-pay | Admitting: *Deleted

## 2013-06-26 NOTE — Telephone Encounter (Signed)
ToShelba Flake Fax: [phone] From: [facility] Date/ Time: 06/23/2013 9:07 PM Taken By: Haywood Filler, CSR Caller: [place]: Orpah Melter Patient: Lindsay Gomez DOB: [birth year] Phone: [phone] Reason for Call: Janett Billow is calling from [facility] regarding a D-Dimer and triponen I ordered on Lindsay Gomez by Dr. Marcello Moores Jones4/12/2013 4:58:00 PM. The results were D Dimer:0.46 triponen I: 0.01 no indication of midocardial injury. Regarding Appointment: Appt Date: Appt Time: Unknown Provider: Reason: Details:

## 2013-12-28 ENCOUNTER — Other Ambulatory Visit: Payer: Self-pay | Admitting: Internal Medicine

## 2013-12-28 DIAGNOSIS — I10 Essential (primary) hypertension: Secondary | ICD-10-CM

## 2013-12-28 DIAGNOSIS — I517 Cardiomegaly: Secondary | ICD-10-CM

## 2013-12-29 MED ORDER — NEBIVOLOL HCL 5 MG PO TABS: 5.0000 mg | ORAL_TABLET | Freq: Every day | ORAL | Status: DC

## 2014-01-05 ENCOUNTER — Encounter: Payer: Self-pay | Admitting: Internal Medicine

## 2014-01-05 ENCOUNTER — Other Ambulatory Visit (INDEPENDENT_AMBULATORY_CARE_PROVIDER_SITE_OTHER): Payer: BC Managed Care – PPO

## 2014-01-05 ENCOUNTER — Ambulatory Visit (INDEPENDENT_AMBULATORY_CARE_PROVIDER_SITE_OTHER): Payer: BC Managed Care – PPO | Admitting: Internal Medicine

## 2014-01-05 VITALS — BP 160/110 | HR 99 | Temp 98.1°F | Resp 16 | Ht 62.0 in | Wt 277.0 lb

## 2014-01-05 DIAGNOSIS — D509 Iron deficiency anemia, unspecified: Secondary | ICD-10-CM

## 2014-01-05 DIAGNOSIS — I10 Essential (primary) hypertension: Secondary | ICD-10-CM

## 2014-01-05 DIAGNOSIS — E876 Hypokalemia: Secondary | ICD-10-CM

## 2014-01-05 DIAGNOSIS — R0600 Dyspnea, unspecified: Secondary | ICD-10-CM

## 2014-01-05 DIAGNOSIS — I517 Cardiomegaly: Secondary | ICD-10-CM

## 2014-01-05 DIAGNOSIS — R0602 Shortness of breath: Secondary | ICD-10-CM | POA: Insufficient documentation

## 2014-01-05 LAB — CBC WITH DIFFERENTIAL/PLATELET
Basophils Absolute: 0.2 10*3/uL — ABNORMAL HIGH (ref 0.0–0.1)
Basophils Relative: 2.4 % (ref 0.0–3.0)
Eosinophils Absolute: 0.1 10*3/uL (ref 0.0–0.7)
Eosinophils Relative: 1.6 % (ref 0.0–5.0)
HCT: 36.2 % (ref 36.0–46.0)
Hemoglobin: 10.6 g/dL — ABNORMAL LOW (ref 12.0–15.0)
Lymphocytes Relative: 29.6 % (ref 12.0–46.0)
Lymphs Abs: 1.9 10*3/uL (ref 0.7–4.0)
MCHC: 29.3 g/dL — ABNORMAL LOW (ref 30.0–36.0)
MCV: 70.7 fl — ABNORMAL LOW (ref 78.0–100.0)
Monocytes Absolute: 0.5 10*3/uL (ref 0.1–1.0)
Monocytes Relative: 7 % (ref 3.0–12.0)
Neutro Abs: 3.9 10*3/uL (ref 1.4–7.7)
Neutrophils Relative %: 59.4 % (ref 43.0–77.0)
Platelets: 397 10*3/uL (ref 150.0–400.0)
RBC: 5.12 Mil/uL — ABNORMAL HIGH (ref 3.87–5.11)
RDW: 22 % — ABNORMAL HIGH (ref 11.5–15.5)
WBC: 6.5 10*3/uL (ref 4.0–10.5)

## 2014-01-05 LAB — IBC PANEL
Iron: 16 ug/dL — ABNORMAL LOW (ref 42–145)
Saturation Ratios: 3.5 % — ABNORMAL LOW (ref 20.0–50.0)
Transferrin: 324.6 mg/dL (ref 212.0–360.0)

## 2014-01-05 LAB — BASIC METABOLIC PANEL
BUN: 10 mg/dL (ref 6–23)
CO2: 29 mEq/L (ref 19–32)
Calcium: 9.1 mg/dL (ref 8.4–10.5)
Chloride: 101 mEq/L (ref 96–112)
Creatinine, Ser: 0.9 mg/dL (ref 0.4–1.2)
GFR: 94.62 mL/min (ref >60.00)
Glucose, Bld: 143 mg/dL — ABNORMAL HIGH (ref 70–99)
Potassium: 3.4 mEq/L — ABNORMAL LOW (ref 3.5–5.1)
Sodium: 135 mEq/L (ref 135–145)

## 2014-01-05 LAB — FERRITIN: Ferritin: 8.2 ng/mL — ABNORMAL LOW (ref 10.0–291.0)

## 2014-01-05 MED ORDER — NEBIVOLOL HCL 5 MG PO TABS: 5.0000 mg | ORAL_TABLET | Freq: Every day | ORAL | Status: DC

## 2014-01-05 MED ORDER — FERRALET 90 90-1 MG PO TABS: 1.0000 | ORAL_TABLET | Freq: Every day | ORAL | Status: DC

## 2014-01-05 MED ORDER — TRIAMTERENE-HCTZ 50-25 MG PO CAPS: 1.0000 | ORAL_CAPSULE | ORAL | Status: DC

## 2014-01-05 NOTE — Assessment & Plan Note (Signed)
Her H and H have worsened and her iron level is down I have asked her to restart iron replacement therapy

## 2014-01-05 NOTE — Assessment & Plan Note (Signed)
Her EKG is normal, her BP is poorly controlled I think the DOE is multifactorial (anemia, obesity, deconditioned, edema) Will treat the anemia and HTN, she will work on her lifestyle modifications

## 2014-01-05 NOTE — Assessment & Plan Note (Signed)
Will start a potassium-sparing diuretic

## 2014-01-05 NOTE — Assessment & Plan Note (Signed)
She will restart Bystolic, wll add on an diuretic as well Will monitor her lytes and renal function today

## 2014-01-05 NOTE — Progress Notes (Signed)
Pre visit review using our clinic review tool, if applicable. No additional management support is needed unless otherwise documented below in the visit note. 

## 2014-01-05 NOTE — Progress Notes (Signed)
Subjective:    Patient ID: Lindsay Gomez, female    DOB: 17-Feb-1971, 43 y.o.   MRN: [id number]  Hypertension This is a chronic problem. The current episode started more than 1 year ago. The problem has been gradually worsening since onset. The problem is uncontrolled. Associated symptoms include peripheral edema and shortness of breath. Pertinent negatives include no anxiety, blurred vision, chest pain, headaches, malaise/fatigue, neck pain, orthopnea, palpitations, PND or sweats. Agents associated with hypertension include NSAIDs. Past treatments include angiotensin blockers and beta blockers. The current treatment provides mild improvement. Compliance problems include diet, exercise and psychosocial issues.       Review of Systems  Constitutional: Negative.  Negative for fever, chills, malaise/fatigue, diaphoresis, appetite change and fatigue.  HENT: Negative.   Eyes: Negative.  Negative for blurred vision.  Respiratory: Positive for shortness of breath. Negative for cough, choking, chest tightness, wheezing and stridor.   Cardiovascular: Negative.  Negative for chest pain, palpitations, orthopnea, leg swelling and PND.  Gastrointestinal: Negative.  Negative for nausea, vomiting, abdominal pain, diarrhea, constipation and blood in stool.  Endocrine: Negative.   Genitourinary: Negative.  Negative for dysuria, urgency, hematuria, flank pain, enuresis, difficulty urinating and dyspareunia.  Musculoskeletal: Negative.  Negative for arthralgias, back pain, myalgias and neck pain.  Skin: Negative.  Negative for rash.  Allergic/Immunologic: Negative.   Neurological: Negative.  Negative for dizziness, tremors, seizures, syncope, facial asymmetry, speech difficulty, weakness, light-headedness, numbness and headaches.  Hematological: Negative.  Negative for adenopathy. Does not bruise/bleed easily.  Psychiatric/Behavioral: Negative.        Objective:   Physical Exam  Vitals  reviewed. Constitutional: She is oriented to person, place, and time. She appears well-developed and well-nourished. No distress.  HENT:  Head: Normocephalic and atraumatic.  Mouth/Throat: Oropharynx is clear and moist. No oropharyngeal exudate.  Eyes: Conjunctivae are normal. Right eye exhibits no discharge. Left eye exhibits no discharge. No scleral icterus.  Neck: Normal range of motion. Neck supple. No JVD present. No tracheal deviation present. No thyromegaly present.  Cardiovascular: Normal rate, regular rhythm, normal heart sounds and intact distal pulses.  Exam reveals no gallop and no friction rub.   No murmur heard. Pulmonary/Chest: Effort normal and breath sounds normal. No stridor. No respiratory distress. She has no wheezes. She has no rales. She exhibits no tenderness.  Abdominal: Soft. Bowel sounds are normal. She exhibits no distension and no mass. There is no tenderness. There is no rebound and no guarding.  Musculoskeletal: Normal range of motion. She exhibits edema (trace edema in BLE). She exhibits no tenderness.  Lymphadenopathy:    She has no cervical adenopathy.  Neurological: She is oriented to person, place, and time.  Skin: Skin is warm and dry. No rash noted. She is not diaphoretic. No erythema. No pallor.     Lab Results  Component Value Date   WBC 8.6 06/23/2013   HGB 8.2 Repeated and verified X2.* 06/23/2013   HCT 28.8* 06/23/2013   PLT 201.0 06/23/2013   GLUCOSE 102* 06/23/2013   CHOL 174 01/06/2013   TRIG 70.0 01/06/2013   HDL 39.70 01/06/2013   LDLDIRECT 140.4 09/24/2011   LDLCALC 120* 01/06/2013   ALT 24 09/14/2012   AST 29 09/14/2012   NA 136 06/23/2013   K 4.2 06/23/2013   CL 103 06/23/2013   CREATININE 0.6 06/23/2013   BUN 8 06/23/2013   CO2 29 06/23/2013   TSH 0.80 01/06/2013   HGBA1C 5.5 01/06/2013  Assessment & Plan:

## 2014-01-08 ENCOUNTER — Encounter: Payer: Self-pay | Admitting: Internal Medicine

## 2014-01-09 ENCOUNTER — Telehealth: Payer: Self-pay | Admitting: Internal Medicine

## 2014-01-09 NOTE — Telephone Encounter (Signed)
emmi emailed °

## 2014-03-20 ENCOUNTER — Telehealth: Payer: Self-pay | Admitting: Internal Medicine

## 2014-03-20 NOTE — Telephone Encounter (Signed)
Patient is requesting TB test.  Can we schedule?

## 2014-03-20 NOTE — Telephone Encounter (Signed)
Left vm for patient to call back to schedule

## 2014-03-20 NOTE — Telephone Encounter (Signed)
yes

## 2014-03-23 ENCOUNTER — Telehealth: Payer: Self-pay | Admitting: Internal Medicine

## 2014-03-23 NOTE — Telephone Encounter (Signed)
Got patient scheduled for a 30 minute OV on the 20th at 2:45.  She is coming in for right thigh pain, a follow up and a TB test.  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  This would mean that she would be in a 15 minute slot would this be ok to do?

## 2014-03-23 NOTE — Telephone Encounter (Signed)
Left vm for patient to call back.

## 2014-03-23 NOTE — Telephone Encounter (Signed)
yes

## 2014-04-04 ENCOUNTER — Ambulatory Visit: Payer: BC Managed Care – PPO | Admitting: Internal Medicine

## 2014-04-05 ENCOUNTER — Ambulatory Visit: Payer: BC Managed Care – PPO | Admitting: Internal Medicine

## 2014-05-14 ENCOUNTER — Encounter: Payer: Self-pay | Admitting: *Deleted

## 2014-05-14 ENCOUNTER — Encounter: Payer: Self-pay | Admitting: Nurse Practitioner

## 2014-05-14 ENCOUNTER — Ambulatory Visit (INDEPENDENT_AMBULATORY_CARE_PROVIDER_SITE_OTHER): Payer: BC Managed Care – PPO | Admitting: Nurse Practitioner

## 2014-05-14 VITALS — BP 150/80 | HR 105 | Temp 102.1°F | Ht 62.0 in | Wt 281.0 lb

## 2014-05-14 DIAGNOSIS — J111 Influenza due to unidentified influenza virus with other respiratory manifestations: Secondary | ICD-10-CM

## 2014-05-14 MED ORDER — HYDROCODONE-HOMATROPINE 5-1.5 MG/5ML PO SYRP: 5.0000 mL | ORAL_SOLUTION | Freq: Every evening | ORAL | Status: DC | PRN

## 2014-05-14 MED ORDER — OSELTAMIVIR PHOSPHATE 75 MG PO CAPS: 75.0000 mg | ORAL_CAPSULE | Freq: Two times a day (BID) | ORAL | Status: DC

## 2014-05-14 NOTE — Progress Notes (Signed)
   Subjective:    Patient ID: Lindsay Gomez, female    DOB: [birth year], 44 y.o.   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 Sudden onset of symptoms on Sat.   Cough This is a new problem. The current episode started in the past 7 days. The problem has been unchanged. The cough is productive of sputum. Associated symptoms include chills, ear congestion, a fever, headaches, myalgias, nasal congestion and a sore throat. Pertinent negatives include no chest pain, ear pain, shortness of breath or wheezing. Nothing aggravates the symptoms. Treatments tried: NSAID. The treatment provided mild relief. anemia, pos ANA      Review of Systems  Constitutional: Positive for fever and chills.  HENT: Positive for sore throat. Negative for ear pain.   Respiratory: Positive for cough. Negative for shortness of breath and wheezing.   Cardiovascular: Negative for chest pain.  Musculoskeletal: Positive for myalgias.  Neurological: Positive for headaches.       Objective:   Physical Exam  Constitutional: She is oriented to person, place, and time. She appears well-developed and well-nourished. No distress.  obese  HENT:  Head: Normocephalic and atraumatic.  Mouth/Throat: Oropharynx is clear and moist. No oropharyngeal exudate.  Clear fluid bilat TM, bones visible  Eyes: Conjunctivae are normal. Right eye exhibits no discharge. Left eye exhibits no discharge.  Neck: Normal range of motion. Neck supple. No thyromegaly present.  Cardiovascular: Regular rhythm and normal heart sounds.   No murmur heard. Rate 105, fever  Pulmonary/Chest: Effort normal and breath sounds normal. No respiratory distress. She has no wheezes. She has no rales.  Lymphadenopathy:    She has no cervical adenopathy.  Neurological: She is alert and oriented to person, place, and time.  Skin: Skin is warm and dry.  Psychiatric: Her behavior is normal. Thought content normal.            Assessment & Plan:  1. Influenza Symptoms management-see pt instructions - HYDROcodone-homatropine (HYCODAN) 5-1.5 MG/5ML syrup; Take 5 mLs by mouth at bedtime as needed for cough.  Dispense: 120 mL; Refill: 0 - oseltamivir (TAMIFLU) 75 MG capsule; Take 1 capsule (75 mg total) by mouth 2 (two) times daily.  Dispense: 10 capsule; Refill: 0  F/u 2 weeks for anemia & blood pressure

## 2014-05-14 NOTE — Progress Notes (Signed)
Pre visit review using our clinic review tool, if applicable. No additional management support is needed unless otherwise documented below in the visit note. 

## 2014-05-14 NOTE — Patient Instructions (Signed)
You likely have flu. The average duration is 5-10 days. Treatment is largely symptom management. Start tamiflu. For sinus congestion, start daily sinus rinses (neilmed Sinus Rinse).  For sore throat use benzocaine throat lozenges or spray.  For aches & fever alternate tylenol & ibuprophen every 4-6 hours.  For cough, you may use a spoonful of honey thinned with lemon juice or hot tea. You may use cough syrup at night. Sip fluids every hour. Rest.  If you are not feeling better in 2 week or develop fever or chest pain, call us for re-evaluation.  Feel better!    Influenza A (H1N1) H1N1 formerly called "swine flu" is a new influenza virus causing sickness in people. The H1N1 virus is different from seasonal influenza viruses. However, the H1N1 symptoms are similar to seasonal influenza and it is spread from person to person. You may be at higher risk for serious problems if you have underlying serious medical conditions. The CDC and the [facility] are following reported cases around the world. CAUSES   The flu is thought to spread mainly person-to-person through coughing or sneezing of infected people.  A person may become infected by touching something with the virus on it and then touching their mouth or nose. SYMPTOMS   Fever.  Headache.  Tiredness.  Cough.  Sore throat.  Runny or stuffy nose.  Body aches.  Diarrhea and vomiting These symptoms are referred to as "flu-like symptoms." A lot of different illnesses, including the common cold, may have similar symptoms. DIAGNOSIS   There are tests that can tell if you have the H1N1 virus.  Confirmed cases of H1N1 will be reported to the state or local health department.  A doctor's exam may be needed to tell whether you have an infection that is a complication of the flu. HOME CARE INSTRUCTIONS   Stay informed. Visit the [facility] website for current recommendations. Visit [employer]. You may also call  1-800-CDC-INFO (419) 412-4975).  Get help early if you develop any of the above symptoms.  If you are at high risk from complications of the flu, talk to your caregiver as soon as you develop flu-like symptoms. Those at higher risk for complications include:  People 65 years or older.  People with chronic medical conditions.  Pregnant women.  Young children.  Your caregiver may recommend antiviral medicine to help treat the flu.  If you get the flu, get plenty of rest, drink enough water and fluids to keep your urine clear or pale yellow, and avoid using alcohol or tobacco.  You may take over-the-counter medicine to relieve the symptoms of the flu if your caregiver approves. (Never give aspirin to children or teenagers who have flu-like symptoms, particularly fever). TREATMENT  If you do get sick, antiviral drugs are available. These drugs can make your illness milder and make you feel better faster. Treatment should start soon after illness starts. It is only effective if taken within the first day of becoming ill. Only your caregiver can prescribe antiviral medication.  PREVENTION   Cover your nose and mouth with a tissue or your arm when you cough or sneeze. Throw the tissue away.  Wash your hands often with soap and warm water, especially after you cough or sneeze. Alcohol-based cleaners are also effective against germs.  Avoid touching your eyes, nose or mouth. This is one way germs spread.  Try to avoid contact with sick people. Follow public health advice regarding school closures. Avoid crowds.  Stay home if  you get sick. Limit contact with others to keep from infecting them. People infected with the H1N1 virus may be able to infect others anywhere from 1 day before feeling sick to 5-7 days after getting flu symptoms.  An H1N1 vaccine is available to help protect against the virus. In addition to the H1N1 vaccine, you will need to be vaccinated for seasonal influenza. The  H1N1 and seasonal vaccines may be given on the same day. The CDC especially recommends the H1N1 vaccine for:  Pregnant women.  People who live with or care for children younger than 41 months of age.  Health care and emergency services personnel.  Persons between the ages of 66 months through 27 years of age.  People from ages 64 through 27 years who are at higher risk for H1N1 because of chronic health disorders or immune system problems. FACEMASKS In community and home settings, the use of facemasks and N95 respirators are not normally recommended. In certain circumstances, a facemask or N95 respirator may be used for persons at increased risk of severe illness from influenza. Your caregiver can give additional recommendations for facemask use. IN CHILDREN, EMERGENCY WARNING SIGNS THAT NEED URGENT MEDICAL CARE:  Fast breathing or trouble breathing.  Bluish skin color.  Not drinking enough fluids.  Not waking up or not interacting normally.  Being so fussy that the child does not want to be held.  Your child has an oral temperature above 102 F (38.9 C), not controlled by medicine.  Your baby is older than 3 months with a rectal temperature of 102 F (38.9 C) or higher.  Your baby is 73 months old or younger with a rectal temperature of 100.4 F (38 C) or higher.  Flu-like symptoms improve but then return with fever and worse cough. IN ADULTS, EMERGENCY WARNING SIGNS THAT NEED URGENT MEDICAL CARE:  Difficulty breathing or shortness of breath.  Pain or pressure in the chest or abdomen.  Sudden dizziness.  Confusion.  Severe or persistent vomiting.  Bluish color.  You have a oral temperature above 102 F (38.9 C), not controlled by medicine.  Flu-like symptoms improve but return with fever and worse cough. SEEK IMMEDIATE MEDICAL CARE IF:  You or someone you know is experiencing any of the above symptoms. When you arrive at the emergency center, report that you think  you have the flu. You may be asked to wear a mask and/or sit in a secluded area to protect others from getting sick. MAKE SURE YOU:   Understand these instructions.  Will watch your condition.  Will get help right away if you are not doing well or get worse. Some of this information courtesy of the CDC.  Document Released: 08/19/2007 Document Revised: 05/25/2011 Document Reviewed: 08/19/2007 [facility] Patient Information 2014 [place], [place].

## 2014-06-27 ENCOUNTER — Ambulatory Visit (INDEPENDENT_AMBULATORY_CARE_PROVIDER_SITE_OTHER): Payer: BC Managed Care – PPO | Admitting: Internal Medicine

## 2014-06-27 ENCOUNTER — Encounter: Payer: Self-pay | Admitting: Internal Medicine

## 2014-06-27 ENCOUNTER — Other Ambulatory Visit (INDEPENDENT_AMBULATORY_CARE_PROVIDER_SITE_OTHER): Payer: BC Managed Care – PPO

## 2014-06-27 VITALS — BP 178/102 | HR 100 | Temp 98.4°F | Resp 16 | Ht 62.0 in | Wt 281.0 lb

## 2014-06-27 DIAGNOSIS — D509 Iron deficiency anemia, unspecified: Secondary | ICD-10-CM

## 2014-06-27 DIAGNOSIS — E876 Hypokalemia: Secondary | ICD-10-CM | POA: Diagnosis not present

## 2014-06-27 DIAGNOSIS — I10 Essential (primary) hypertension: Secondary | ICD-10-CM

## 2014-06-27 DIAGNOSIS — N920 Excessive and frequent menstruation with regular cycle: Secondary | ICD-10-CM | POA: Diagnosis not present

## 2014-06-27 DIAGNOSIS — I517 Cardiomegaly: Secondary | ICD-10-CM

## 2014-06-27 DIAGNOSIS — Z23 Encounter for immunization: Secondary | ICD-10-CM

## 2014-06-27 LAB — CBC WITH DIFFERENTIAL/PLATELET
Basophils Absolute: 0 10*3/uL (ref 0.0–0.1)
Basophils Relative: 0.1 % (ref 0.0–3.0)
Eosinophils Absolute: 0.1 10*3/uL (ref 0.0–0.7)
Eosinophils Relative: 1.5 % (ref 0.0–5.0)
HCT: 30 % — ABNORMAL LOW (ref 36.0–46.0)
Hemoglobin: 9.1 g/dL — ABNORMAL LOW (ref 12.0–15.0)
Lymphocytes Relative: 25.9 % (ref 12.0–46.0)
Lymphs Abs: 1.8 10*3/uL (ref 0.7–4.0)
MCHC: 30.4 g/dL (ref 30.0–36.0)
MCV: 67.6 fl — ABNORMAL LOW (ref 78.0–100.0)
Monocytes Absolute: 0.5 10*3/uL (ref 0.1–1.0)
Monocytes Relative: 7.6 % (ref 3.0–12.0)
Neutro Abs: 4.6 10*3/uL (ref 1.4–7.7)
Neutrophils Relative %: 64.9 % (ref 43.0–77.0)
Platelets: 389 10*3/uL (ref 150.0–400.0)
RBC: 4.46 Mil/uL (ref 3.87–5.11)
RDW: 19.4 % — ABNORMAL HIGH (ref 11.5–15.5)
WBC: 7.1 10*3/uL (ref 4.0–10.5)

## 2014-06-27 LAB — BASIC METABOLIC PANEL
BUN: 8 mg/dL (ref 6–23)
CO2: 35 mEq/L — ABNORMAL HIGH (ref 19–32)
Calcium: 9.3 mg/dL (ref 8.4–10.5)
Chloride: 98 mEq/L (ref 96–112)
Creatinine, Ser: 0.72 mg/dL (ref 0.40–1.20)
GFR: 114.33 mL/min (ref >60.00)
Glucose, Bld: 110 mg/dL — ABNORMAL HIGH (ref 70–99)
Potassium: 3.2 mEq/L — ABNORMAL LOW (ref 3.5–5.1)
Sodium: 140 mEq/L (ref 135–145)

## 2014-06-27 LAB — CORTISOL: Cortisol, Plasma: 8.1 ug/dL

## 2014-06-27 LAB — IBC PANEL
Iron: 10 ug/dL — ABNORMAL LOW (ref 42–145)
Saturation Ratios: 2.1 % — ABNORMAL LOW (ref 20.0–50.0)
Transferrin: 347 mg/dL (ref 212.0–360.0)

## 2014-06-27 LAB — FERRITIN: Ferritin: 5.8 ng/mL — ABNORMAL LOW (ref 10.0–291.0)

## 2014-06-27 MED ORDER — HYDROCHLOROTHIAZIDE 25 MG PO TABS: 25.0000 mg | ORAL_TABLET | Freq: Every day | ORAL | Status: DC

## 2014-06-27 MED ORDER — NEBIVOLOL HCL 10 MG PO TABS: 10.0000 mg | ORAL_TABLET | Freq: Every day | ORAL | Status: DC

## 2014-06-27 MED ORDER — POTASSIUM CHLORIDE CRYS ER 20 MEQ PO TBCR: 20.0000 meq | EXTENDED_RELEASE_TABLET | Freq: Two times a day (BID) | ORAL | Status: DC

## 2014-06-27 NOTE — Patient Instructions (Signed)

## 2014-06-27 NOTE — Progress Notes (Signed)
Subjective:    Patient ID: Lindsay Gomez, female    DOB: [birth year], 44 y.o.   MRN: [id number]  Hypertension This is a chronic problem. The current episode started more than 1 year ago. The problem has been gradually worsening since onset. The problem is uncontrolled. Associated symptoms include malaise/fatigue. Pertinent negatives include no anxiety, blurred vision, chest pain, headaches, neck pain, orthopnea, palpitations, peripheral edema, PND, shortness of breath or sweats. Past treatments include beta blockers. The current treatment provides mild improvement. Compliance problems include diet and exercise.  Hypertensive end-organ damage includes left ventricular hypertrophy.      Review of Systems  Constitutional: Positive for malaise/fatigue. Negative for fever, chills, diaphoresis, appetite change and fatigue.  HENT: Negative.   Eyes: Negative.  Negative for blurred vision.  Respiratory: Negative.  Negative for cough, choking, chest tightness, shortness of breath and stridor.   Cardiovascular: Negative.  Negative for chest pain, palpitations, orthopnea, leg swelling and PND.  Gastrointestinal: Negative.  Negative for nausea, vomiting, abdominal pain, diarrhea, constipation and blood in stool.  Endocrine: Negative.  Negative for polydipsia, polyphagia and polyuria.  Genitourinary: Positive for vaginal bleeding (long heavy periods). Negative for dysuria, urgency, frequency, hematuria, flank pain, decreased urine volume, vaginal discharge, difficulty urinating and vaginal pain.  Musculoskeletal: Negative.  Negative for back pain, joint swelling, arthralgias and neck pain.  Skin: Negative.  Negative for rash.  Allergic/Immunologic: Negative.   Neurological: Negative.  Negative for dizziness, tremors, syncope, light-headedness, numbness and headaches.  Hematological: Negative.  Negative for adenopathy. Does not bruise/bleed easily.  Psychiatric/Behavioral: Negative.          Objective:   Physical Exam  Constitutional: She is oriented to person, place, and time. She appears well-developed and well-nourished. No distress.  HENT:  Head: Normocephalic and atraumatic.  Mouth/Throat: Oropharynx is clear and moist. No oropharyngeal exudate.  Eyes: Conjunctivae are normal. Right eye exhibits no discharge. Left eye exhibits no discharge. No scleral icterus.  Neck: Normal range of motion. Neck supple. No JVD present. No tracheal deviation present. No thyromegaly present.  Cardiovascular: Normal rate, regular rhythm, normal heart sounds and intact distal pulses.  Exam reveals no gallop and no friction rub.   No murmur heard. Pulmonary/Chest: Effort normal and breath sounds normal. No stridor. No respiratory distress. She has no wheezes. She has no rales. She exhibits no tenderness.  Abdominal: Soft. Bowel sounds are normal. She exhibits no distension and no mass. There is no tenderness. There is no rebound and no guarding.  Musculoskeletal: Normal range of motion. She exhibits no edema or tenderness.  Lymphadenopathy:    She has no cervical adenopathy.  Neurological: She is oriented to person, place, and time.  Skin: Skin is warm and dry. No rash noted. She is not diaphoretic. No erythema. No pallor.  Vitals reviewed.    Lab Results  Component Value Date   WBC 6.5 01/05/2014   HGB 10.6* 01/05/2014   HCT 36.2 01/05/2014   PLT 397.0 01/05/2014   GLUCOSE 143* 01/05/2014   CHOL 174 01/06/2013   TRIG 70.0 01/06/2013   HDL 39.70 01/06/2013   LDLDIRECT 140.4 09/24/2011   LDLCALC 120* 01/06/2013   ALT 24 09/14/2012   AST 29 09/14/2012   NA 135 01/05/2014   K 3.4* 01/05/2014   CL 101 01/05/2014   CREATININE 0.9 01/05/2014   BUN 10 01/05/2014   CO2 29 01/05/2014   TSH 0.80 01/06/2013   HGBA1C 5.5 01/06/2013       Assessment &  Plan:

## 2014-06-27 NOTE — Progress Notes (Signed)
Pre visit review using our clinic review tool, if applicable. No additional management support is needed unless otherwise documented below in the visit note. 

## 2014-06-28 MED ORDER — FE FUM-VIT C-VIT B12-FA 460-60-0.01-1 MG PO CAPS: 1.0000 | ORAL_CAPSULE | Freq: Every day | ORAL | Status: DC

## 2014-06-28 NOTE — Assessment & Plan Note (Signed)
This is not improved Will restart the iron replacement therapy I have asked her to have the heavy menses treated

## 2014-06-28 NOTE — Assessment & Plan Note (Signed)
Her BP is not well controlled Will increase bystolic to 10 mg per day Will restart HCTZ She will work on her lifestyle modifications

## 2014-06-28 NOTE — Assessment & Plan Note (Signed)
She will restart K+ replacement therapy

## 2014-06-28 NOTE — Assessment & Plan Note (Signed)
This is causing iron defic anemia I have asked her to see GYN to consider a procedure to treat this

## 2014-11-12 ENCOUNTER — Telehealth: Payer: Self-pay | Admitting: Internal Medicine

## 2014-11-12 NOTE — Telephone Encounter (Signed)
Ok with me 

## 2014-11-12 NOTE — Telephone Encounter (Signed)
yes

## 2014-11-12 NOTE — Telephone Encounter (Signed)
Patient would like to transfer from Dr. Ronnald Ramp to Terri Piedra Please advise

## 2014-11-14 ENCOUNTER — Ambulatory Visit (INDEPENDENT_AMBULATORY_CARE_PROVIDER_SITE_OTHER): Payer: BC Managed Care – PPO | Admitting: Family

## 2014-11-14 ENCOUNTER — Other Ambulatory Visit (INDEPENDENT_AMBULATORY_CARE_PROVIDER_SITE_OTHER): Payer: BC Managed Care – PPO

## 2014-11-14 ENCOUNTER — Encounter: Payer: Self-pay | Admitting: Family

## 2014-11-14 VITALS — BP 172/108 | HR 95 | Temp 98.9°F | Resp 18 | Ht 62.0 in | Wt 287.0 lb

## 2014-11-14 DIAGNOSIS — N920 Excessive and frequent menstruation with regular cycle: Secondary | ICD-10-CM | POA: Diagnosis not present

## 2014-11-14 DIAGNOSIS — I10 Essential (primary) hypertension: Secondary | ICD-10-CM | POA: Diagnosis not present

## 2014-11-14 DIAGNOSIS — D509 Iron deficiency anemia, unspecified: Secondary | ICD-10-CM | POA: Diagnosis not present

## 2014-11-14 LAB — RETICULOCYTES
ABS Retic: 132.8 10*3/uL (ref 19.0–186.0)
RBC.: 4.58 MIL/uL (ref 3.87–5.11)
Retic Ct Pct: 2.9 % — ABNORMAL HIGH (ref 0.4–2.3)

## 2014-11-14 LAB — FERRITIN: Ferritin: 45.7 ng/mL (ref 10.0–291.0)

## 2014-11-14 LAB — HEMOGLOBIN A1C: Hgb A1c MFr Bld: 5.7 % (ref 4.6–6.5)

## 2014-11-14 LAB — IBC PANEL
Iron: 14 ug/dL — ABNORMAL LOW (ref 42–145)
Saturation Ratios: 3 % — ABNORMAL LOW (ref 20.0–50.0)
Transferrin: 337 mg/dL (ref 212.0–360.0)

## 2014-11-14 MED ORDER — CHLORTHALIDONE 25 MG PO TABS: 25.0000 mg | ORAL_TABLET | Freq: Every day | ORAL | Status: DC

## 2014-11-14 NOTE — Assessment & Plan Note (Signed)
Patient with morbid obesity and BMI of 52. Indicates she is trying to work on nutrition and takes responsibility for her nutrition. Indicates she tries to go to the gym however she is unable to exercise secondary to the shortness of breath. Encouraged exercising as tolerated while working on anemia.

## 2014-11-14 NOTE — Patient Instructions (Signed)
Thank you for choosing [facility].  Summary/Instructions:  Your prescription(s) have been submitted to your pharmacy or been printed and provided for you. Please take as directed and contact our office if you believe you are having problem(s) with the medication(s) or have any questions.  Please stop by the lab on the basement level of the building for your blood work. Your results will be released to [facility] (or called to you) after review, usually within 72 hours after test completion. If any changes need to be made, you will be notified at that same time.  If your symptoms worsen or fail to improve, please contact our office for further instruction, or in case of emergency go directly to the emergency room at the closest medical facility.   Follow up with GYN  Continue to take iron  Follow up for a nurse visit in 2 weeks.

## 2014-11-14 NOTE — Progress Notes (Signed)
Pre visit review using our clinic review tool, if applicable. No additional management support is needed unless otherwise documented below in the visit note. 

## 2014-11-14 NOTE — Telephone Encounter (Signed)
Unable to leave msg.

## 2014-11-14 NOTE — Assessment & Plan Note (Signed)
Blood pressure remains above goal of 140/90 and not currently taking medications as she self discontinued her medications secondary to "not working". Indicates she is working on nutrition and exercise. Question ability to be compliant with any medication regimen. Discontinue hydrochlorothiazide and Bystolic. Start chlorthalidone. Continue previously prescribed potassium. Follow-up in 2 weeks for nurse visit.

## 2014-11-14 NOTE — Assessment & Plan Note (Signed)
Most likely the cause of her iron deficiency anemia. Follow up with GYN for management of menstrual blood loss.

## 2014-11-14 NOTE — Progress Notes (Signed)
Subjective:    Patient ID: Lindsay Gomez, female    DOB: 08-04-1970, 44 y.o.   MRN: [id number]  Chief Complaint  Patient presents with  . Fatigue    concerned about BP medications, states that they are not bringing her BP down, states she gets random SOB even when she is just laying down and she has "extreme fatigue", has trouble working out bc of the breathing    HPI:  Lindsay Gomez is a 44 y.o. female with a PMH of menorrhagia, iron deficiency anemia, hypertension, morbid obesity, hyperlipidemia, and left ventricular hypertrophy who presents today for an office follow-up and to establish care with this provider.   1.) Hypertension - Currently not taking either of the blood pressure medications listed because "they don't work." In the past she has tried HCTZ, losartan, bystolic, and Maxide all of which did not help to control her blood pressure.  BP Readings from Last 3 Encounters:  11/14/14 172/108  06/27/14 178/102  05/14/14 150/80    2.) Iron Deficiency Anemia - continues to experience the associated symptoms of shortness of breath and fatigue. Maintained on iron supplementation. Takes the medication as prescribed and denies adverse side effects. Does have continual heavy menstrual cycles and indicates GYN recommendation of ablation and she is interested in hysterectomy.  3.) Obesity - Notes she continues to gain weight and has a decreased ability to exercise secondary to her shortness of breath and fatigue. Reports that she is not eating the best and she takes responsibility for that, but does not want to take any medications that could potentially cause her to gain weight.    Wt Readings from Last 3 Encounters:  11/14/14 287 lb (130.182 kg)  06/27/14 281 lb (127.461 kg)  05/14/14 281 lb (127.461 kg)    Allergies  Allergen Reactions  . Lisinopril Other (See Comments)    Cough     Current Outpatient Prescriptions on File Prior to Visit  Medication Sig Dispense  Refill  . ergocalciferol (VITAMIN D2) 50000 UNITS capsule Take 50,000 Units by mouth 2 (two) times a week.    . Fe Fum-Vit C-Vit B12-FA (FERROGELS FORTE) 460-60-0.01-1 MG CAPS capsule Take 1 capsule by mouth daily. 90 capsule 3  . Flaxseed, Linseed, (FLAX SEED OIL) 1000 MG CAPS Take 1 capsule by mouth daily.      . potassium chloride SA (K-DUR,KLOR-CON) 20 MEQ tablet Take 1 tablet (20 mEq total) by mouth 2 (two) times daily. 180 tablet 1   No current facility-administered medications on file prior to visit.    Review of Systems  Constitutional: Positive for fatigue. Negative for diaphoresis.  Eyes:       Negative in changes of vision  Respiratory: Negative for chest tightness and shortness of breath.   Cardiovascular: Negative for chest pain, palpitations and leg swelling.  Skin: Positive for pallor.  Neurological: Positive for headaches. Negative for weakness and light-headedness.      Objective:    BP 172/108 mmHg  Pulse 95  Temp(Src) 98.9 F (37.2 C) (Oral)  Resp 18  Ht 5\' 2"  (1.575 m)  Wt 287 lb (130.182 kg)  BMI 52.48 kg/m2  SpO2 99% Nursing note and vital signs reviewed.  Physical Exam  Constitutional: She is oriented to person, place, and time. She appears well-developed and well-nourished. No distress.  Morbidly obese female appears her stated age and dressed appropriately for the situation.   Eyes:  Lower eye lids appear pale.   Cardiovascular: Normal rate, regular  rhythm, normal heart sounds and intact distal pulses.   Pulmonary/Chest: Effort normal and breath sounds normal.  Neurological: She is alert and oriented to person, place, and time.  Skin: Skin is warm and dry. There is pallor.  Psychiatric: She has a normal mood and affect. Her behavior is normal. Judgment and thought content normal.       Assessment & Plan:   Problem List Items Addressed This Visit      Cardiovascular and Mediastinum   Essential hypertension    Blood pressure remains above goal  of 140/90 and not currently taking medications as she self discontinued her medications secondary to "not working". Indicates she is working on nutrition and exercise. Question ability to be compliant with any medication regimen. Discontinue hydrochlorothiazide and Bystolic. Start chlorthalidone. Continue previously prescribed potassium. Follow-up in 2 weeks for nurse visit.      Relevant Medications   chlorthalidone (HYGROTON) 25 MG tablet     Other   Morbid obesity    Patient with morbid obesity and BMI of 52. Indicates she is trying to work on nutrition and takes responsibility for her nutrition. Indicates she tries to go to the gym however she is unable to exercise secondary to the shortness of breath. Encouraged exercising as tolerated while working on anemia.       Iron deficiency anemia - Primary    Symptoms of fatigue and shortness of breath most likely related to iron deficiency anemia as a result of chronic blood loss through her menstrual cycle. Continue current dosage of Ferrogels forte. Has had stomach irritation and constipation with ferrous sulfate. Obtain iron panel, ferritin, and reticulocytes. Counseled regarding follow-up with GYN for potential intervention to reduce blood loss during menstrual cycles. Follow-up pending lab work.      Relevant Orders   IBC panel   Hemoglobin A1c   Ferritin   Reticulocytes   Menorrhagia    Most likely the cause of her iron deficiency anemia. Follow up with GYN for management of menstrual blood loss.

## 2014-11-14 NOTE — Assessment & Plan Note (Signed)
Symptoms of fatigue and shortness of breath most likely related to iron deficiency anemia as a result of chronic blood loss through her menstrual cycle. Continue current dosage of Ferrogels forte. Has had stomach irritation and constipation with ferrous sulfate. Obtain iron panel, ferritin, and reticulocytes. Counseled regarding follow-up with GYN for potential intervention to reduce blood loss during menstrual cycles. Follow-up pending lab work.

## 2014-11-20 ENCOUNTER — Encounter: Payer: Self-pay | Admitting: Family

## 2014-11-29 ENCOUNTER — Encounter: Payer: Self-pay | Admitting: Family

## 2014-11-29 ENCOUNTER — Ambulatory Visit (INDEPENDENT_AMBULATORY_CARE_PROVIDER_SITE_OTHER): Payer: BC Managed Care – PPO | Admitting: Family

## 2014-11-29 ENCOUNTER — Ambulatory Visit: Payer: BC Managed Care – PPO | Admitting: Family

## 2014-11-29 VITALS — BP 152/94 | HR 97 | Temp 98.7°F | Resp 18 | Ht 62.0 in | Wt 287.0 lb

## 2014-11-29 DIAGNOSIS — I10 Essential (primary) hypertension: Secondary | ICD-10-CM

## 2014-11-29 MED ORDER — CLONIDINE HCL 0.1 MG PO TABS: 0.1000 mg | ORAL_TABLET | Freq: Two times a day (BID) | ORAL | Status: DC

## 2014-11-29 NOTE — Assessment & Plan Note (Signed)
Blood pressure is improved with chlorthalidone, however remains above goal of 140/90. Continue current dosage of chlorthalidone. Start clonidine. Continue to monitor blood pressure as able. Follow-up to determine blood pressure control in about one month.

## 2014-11-29 NOTE — Progress Notes (Signed)
Pre visit review using our clinic review tool, if applicable. No additional management support is needed unless otherwise documented below in the visit note. 

## 2014-11-29 NOTE — Progress Notes (Signed)
   Subjective:    Patient ID: Lindsay Gomez, female    DOB: 1970-05-07, 44 y.o.   MRN: [id number]  Chief Complaint  Patient presents with  . Follow-up    states that she takes her BP medication everyday, she thinks her BP has gotten better since taking the medication    HPI:  Lindsay Gomez is a 44 y.o. female with a PMH of hypertension, obesity, iron deficiency, and hyperlipidemia who presents today for an office follow up.  1.) Hypertension - Previously uncontrolled hypertension and started on chlorthaladone. Takes the medication as prescribed and denies adverse side effects or hypotension. Reports that she believes her blood pressure has gotten better since taking the medication. Reports that she continues to be under a significant amount of stress at work.  BP Readings from Last 3 Encounters:  11/29/14 152/94  11/14/14 172/108  06/27/14 178/102    Allergies  Allergen Reactions  . Lisinopril Other (See Comments)    Cough     Current Outpatient Prescriptions on File Prior to Visit  Medication Sig Dispense Refill  . chlorthalidone (HYGROTON) 25 MG tablet Take 1 tablet (25 mg total) by mouth daily. 30 tablet 0  . ergocalciferol (VITAMIN D2) 50000 UNITS capsule Take 50,000 Units by mouth 2 (two) times a week.    . Fe Fum-Vit C-Vit B12-FA (FERROGELS FORTE) 460-60-0.01-1 MG CAPS capsule Take 1 capsule by mouth daily. 90 capsule 3  . Flaxseed, Linseed, (FLAX SEED OIL) 1000 MG CAPS Take 1 capsule by mouth daily.      . potassium chloride SA (K-DUR,KLOR-CON) 20 MEQ tablet Take 1 tablet (20 mEq total) by mouth 2 (two) times daily. 180 tablet 1   No current facility-administered medications on file prior to visit.    Review of Systems  Constitutional: Negative for fever and chills.  Eyes:       Negative for changes in vision.  Respiratory: Negative for chest tightness and shortness of breath.   Cardiovascular: Negative for chest pain, palpitations and leg swelling.    Neurological: Positive for headaches.      Objective:    BP 152/94 mmHg  Pulse 97  Temp(Src) 98.7 F (37.1 C) (Oral)  Resp 18  Ht 5\' 2"  (1.575 m)  Wt 287 lb (130.182 kg)  BMI 52.48 kg/m2  SpO2 99% Nursing note and vital signs reviewed.  Physical Exam  Constitutional: She is oriented to person, place, and time. She appears well-developed and well-nourished. No distress.  Morbidly obese female lying on the table, dressed appropriately for the situation and appears her stated age.  Cardiovascular: Normal rate, regular rhythm, normal heart sounds and intact distal pulses.   Pulmonary/Chest: Effort normal and breath sounds normal.  Neurological: She is alert and oriented to person, place, and time.  Skin: Skin is warm and dry.  Psychiatric: She has a normal mood and affect. Her behavior is normal. Judgment and thought content normal.       Assessment & Plan:   Problem List Items Addressed This Visit      Cardiovascular and Mediastinum   Essential hypertension - Primary    Blood pressure is improved with chlorthalidone, however remains above goal of 140/90. Continue current dosage of chlorthalidone. Start clonidine. Continue to monitor blood pressure as able. Follow-up to determine blood pressure control in about one month.      Relevant Medications   cloNIDine (CATAPRES) 0.1 MG tablet

## 2014-11-29 NOTE — Patient Instructions (Signed)
Thank you for choosing [facility].  Summary/Instructions:  Please continue your medications as prescribed and add clonidine.   Your prescription(s) have been submitted to your pharmacy or been printed and provided for you. Please take as directed and contact our office if you believe you are having problem(s) with the medication(s) or have any questions.  If your symptoms worsen or fail to improve, please contact our office for further instruction, or in case of emergency go directly to the emergency room at the closest medical facility.

## 2014-12-26 ENCOUNTER — Other Ambulatory Visit: Payer: Self-pay | Admitting: Obstetrics and Gynecology

## 2014-12-28 ENCOUNTER — Encounter: Payer: Self-pay | Admitting: Family

## 2014-12-28 ENCOUNTER — Other Ambulatory Visit (INDEPENDENT_AMBULATORY_CARE_PROVIDER_SITE_OTHER): Payer: BC Managed Care – PPO

## 2014-12-28 ENCOUNTER — Ambulatory Visit (INDEPENDENT_AMBULATORY_CARE_PROVIDER_SITE_OTHER): Payer: BC Managed Care – PPO | Admitting: Family

## 2014-12-28 VITALS — BP 154/88 | HR 90 | Temp 98.0°F | Resp 18 | Ht 62.0 in | Wt 277.0 lb

## 2014-12-28 DIAGNOSIS — I10 Essential (primary) hypertension: Secondary | ICD-10-CM

## 2014-12-28 DIAGNOSIS — D509 Iron deficiency anemia, unspecified: Secondary | ICD-10-CM

## 2014-12-28 LAB — FERRITIN: Ferritin: 96.4 ng/mL (ref 10.0–291.0)

## 2014-12-28 LAB — CBC
HCT: 36.4 % (ref 36.0–46.0)
Hemoglobin: 11 g/dL — ABNORMAL LOW (ref 12.0–15.0)
MCHC: 30.1 g/dL (ref 30.0–36.0)
MCV: 71.1 fl — ABNORMAL LOW (ref 78.0–100.0)
Platelets: 309 10*3/uL (ref 150.0–400.0)
RBC: 5.12 Mil/uL — ABNORMAL HIGH (ref 3.87–5.11)
RDW: 21.6 % — ABNORMAL HIGH (ref 11.5–15.5)
WBC: 7.8 10*3/uL (ref 4.0–10.5)

## 2014-12-28 LAB — IBC PANEL
Iron: 229 ug/dL — ABNORMAL HIGH (ref 42–145)
Saturation Ratios: 53.1 % — ABNORMAL HIGH (ref 20.0–50.0)
Transferrin: 308 mg/dL (ref 212.0–360.0)

## 2014-12-28 MED ORDER — AMLODIPINE BESYLATE 10 MG PO TABS: 10.0000 mg | ORAL_TABLET | Freq: Every day | ORAL | Status: DC

## 2014-12-28 MED ORDER — CHLORTHALIDONE 25 MG PO TABS: 25.0000 mg | ORAL_TABLET | Freq: Every day | ORAL | Status: DC

## 2014-12-28 NOTE — Patient Instructions (Addendum)
Thank you for choosing [facility].  Summary/Instructions:  Your prescription(s) have been submitted to your pharmacy or been printed and provided for you. Please take as directed and contact our office if you believe you are having problem(s) with the medication(s) or have any questions.  If your symptoms worsen or fail to improve, please contact our office for further instruction, or in case of emergency go directly to the emergency room at the closest medical facility.   Iron Deficiency Anemia, Adult Anemia is a condition in which there are less red blood cells or hemoglobin in the blood than normal. Hemoglobin is the part of red blood cells that carries oxygen. Iron deficiency anemia is anemia caused by too little iron. It is the most common type of anemia. It may leave you tired and short of breath. CAUSES   Lack of iron in the diet.  Poor absorption of iron, as seen with intestinal disorders.  Intestinal bleeding.  Heavy periods. SIGNS AND SYMPTOMS  Mild anemia may not be noticeable. Symptoms may include:  Fatigue.  Headache.  Pale skin.  Weakness.  Tiredness.  Shortness of breath.  Dizziness.  Cold hands and feet.  Fast or irregular heartbeat. DIAGNOSIS  Diagnosis requires a thorough evaluation and physical exam by your health care provider. Blood tests are generally used to confirm iron deficiency anemia. Additional tests may be done to find the underlying cause of your anemia. These may include:  Testing for blood in the stool (fecal occult blood test).  A procedure to see inside the colon and rectum (colonoscopy).  A procedure to see inside the esophagus and stomach (endoscopy). TREATMENT  Iron deficiency anemia is treated by correcting the cause of the deficiency. Treatment may involve:  Adding iron-rich foods to your diet.  Taking iron supplements. Pregnant or breastfeeding women need to take extra iron because their normal diet usually does not  provide the required amount.  Taking vitamins. Vitamin C improves the absorption of iron. Your health care provider may recommend that you take your iron tablets with a glass of orange juice or vitamin C supplement.  Medicines to make heavy menstrual flow lighter.  Surgery. HOME CARE INSTRUCTIONS   Take iron as directed by your health care provider.  If you cannot tolerate taking iron supplements by mouth, talk to your health care provider about taking them through a vein (intravenously) or an injection into a muscle.  For the best iron absorption, iron supplements should be taken on an empty stomach. If you cannot tolerate them on an empty stomach, you may need to take them with food.  Do not drink milk or take antacids at the same time as your iron supplements. Milk and antacids may interfere with the absorption of iron.  Iron supplements can cause constipation. Make sure to include fiber in your diet to prevent constipation. A stool softener may also be recommended.  Take vitamins as directed by your health care provider.  Eat a diet rich in iron. Foods high in iron include liver, lean beef, whole-grain bread, eggs, dried fruit, and dark green leafy vegetables. SEEK IMMEDIATE MEDICAL CARE IF:   You faint. If this happens, do not drive. Call your local emergency services (911 in [place] if no other help is available.  You have chest pain.  You feel nauseous or vomit.  You have severe or increased shortness of breath with activity.  You feel weak.  You have a rapid heartbeat.  You have unexplained sweating.  You become  light-headed when getting up from a chair or bed. MAKE SURE YOU:   Understand these instructions.  Will watch your condition.  Will get help right away if you are not doing well or get worse.   This information is not intended to replace advice given to you by your health care provider. Make sure you discuss any questions you have with your health care  provider.   Document Released: 02/28/2000 Document Revised: 03/23/2014 Document Reviewed: 11/07/2012 Elsevier Interactive Patient Education 2016 [place] DASH stands for "Dietary Approaches to Stop Hypertension." The DASH eating plan is a healthy eating plan that has been shown to reduce high blood pressure (hypertension). Additional health benefits may include reducing the risk of type 2 diabetes mellitus, heart disease, and stroke. The DASH eating plan may also help with weight loss. WHAT DO I NEED TO KNOW ABOUT THE DASH EATING PLAN? For the DASH eating plan, you will follow these general guidelines:  Choose foods with a percent daily value for sodium of less than 5% (as listed on the food label).  Use salt-free seasonings or herbs instead of table salt or sea salt.  Check with your health care provider or pharmacist before using salt substitutes.  Eat lower-sodium products, often labeled as "lower sodium" or "no salt added."  Eat fresh foods.  Eat more vegetables, fruits, and low-fat dairy products.  Choose whole grains. Look for the word "whole" as the first word in the ingredient list.  Choose fish and skinless chicken or [place] more often than red meat. Limit fish, poultry, and meat to 6 oz (170 g) each day.  Limit sweets, desserts, sugars, and sugary drinks.  Choose heart-healthy fats.  Limit cheese to 1 oz (28 g) per day.  Eat more home-cooked food and less restaurant, buffet, and fast food.  Limit fried foods.  Cook foods using methods other than frying.  Limit canned vegetables. If you do use them, rinse them well to decrease the sodium.  When eating at a restaurant, ask that your food be prepared with less salt, or no salt if possible. WHAT FOODS CAN I EAT? Seek help from a dietitian for individual calorie needs. Grains Whole grain or whole wheat bread. Brown rice. Whole grain or whole wheat pasta. Quinoa, bulgur, and whole grain  cereals. Low-sodium cereals. Corn or whole wheat flour tortillas. Whole grain cornbread. Whole grain crackers. Low-sodium crackers. Vegetables Fresh or frozen vegetables (raw, steamed, roasted, or grilled). Low-sodium or reduced-sodium tomato and vegetable juices. Low-sodium or reduced-sodium tomato sauce and paste. Low-sodium or reduced-sodium canned vegetables.  Fruits All fresh, canned (in natural juice), or frozen fruits. Meat and Other Protein Products Ground beef (85% or leaner), grass-fed beef, or beef trimmed of fat. Skinless chicken or [place]. Ground chicken or [place]. Pork trimmed of fat. All fish and seafood. Eggs. Dried beans, peas, or lentils. Unsalted nuts and seeds. Unsalted canned beans. Dairy Low-fat dairy products, such as skim or 1% milk, 2% or reduced-fat cheeses, low-fat ricotta or cottage cheese, or plain low-fat yogurt. Low-sodium or reduced-sodium cheeses. Fats and Oils Tub margarines without trans fats. Light or reduced-fat mayonnaise and salad dressings (reduced sodium). Avocado. Safflower, olive, or canola oils. Natural peanut or almond butter. Other Unsalted popcorn and pretzels. The items listed above may not be a complete list of recommended foods or beverages. Contact your dietitian for more options. WHAT FOODS ARE NOT RECOMMENDED? Grains White bread. White pasta. White rice. Refined cornbread. Bagels and croissants.  Crackers that contain trans fat. Vegetables Creamed or fried vegetables. Vegetables in a cheese sauce. Regular canned vegetables. Regular canned tomato sauce and paste. Regular tomato and vegetable juices. Fruits Dried fruits. Canned fruit in light or heavy syrup. Fruit juice. Meat and Other Protein Products Fatty cuts of meat. Ribs, chicken wings, bacon, sausage, bologna, salami, chitterlings, fatback, hot dogs, bratwurst, and packaged luncheon meats. Salted nuts and seeds. Canned beans with salt. Dairy Whole or 2% milk, cream, half-and-half, and  cream cheese. Whole-fat or sweetened yogurt. Full-fat cheeses or blue cheese. Nondairy creamers and whipped toppings. Processed cheese, cheese spreads, or cheese curds. Condiments Onion and garlic salt, seasoned salt, table salt, and sea salt. Canned and packaged gravies. Worcestershire sauce. Tartar sauce. Barbecue sauce. Teriyaki sauce. Soy sauce, including reduced sodium. Steak sauce. Fish sauce. Oyster sauce. Cocktail sauce. Horseradish. Ketchup and mustard. Meat flavorings and tenderizers. Bouillon cubes. Hot sauce. Tabasco sauce. Marinades. Taco seasonings. Relishes. Fats and Oils Butter, stick margarine, lard, shortening, ghee, and bacon fat. Coconut, palm kernel, or palm oils. Regular salad dressings. Other Pickles and olives. Salted popcorn and pretzels. The items listed above may not be a complete list of foods and beverages to avoid. Contact your dietitian for more information. WHERE CAN I FIND MORE INFORMATION? National Heart, Lung, and Blood Institute: travelstabloid.com   This information is not intended to replace advice given to you by your health care provider. Make sure you discuss any questions you have with your health care provider.   Document Released: 02/19/2011 Document Revised: 03/23/2014 Document Reviewed: 01/04/2013 Elsevier Interactive Patient Education [employer].

## 2014-12-28 NOTE — Assessment & Plan Note (Signed)
Iron deficiency anemia maintained on Ferrogels Forte and has noted improvement in fatigue without adverse side effects. Obtain IBC panel, ferritin and CBC. Continue current dosage of Ferrogels pending lab work.

## 2014-12-28 NOTE — Progress Notes (Signed)
Subjective:    Patient ID: Lindsay Gomez, female    DOB: [birth year], 44 y.o.   MRN: [id number]  Chief Complaint  Patient presents with  . Follow-up    testing for hystorectomy    HPI:  Lindsay Gomez is a 44 y.o. female who  has a past medical history of Anemia; Hypertension; Allergy; and Morbid obesity ([place]). and presents today    1.) Hypertension - Currently maintained on clonidine and chlorthaladone. Takes the medication as prescribed and notes some fatigue with the medication, no other side effects or hypotensive events.   BP Readings from Last 3 Encounters:  12/28/14 154/88  11/29/14 152/94  11/14/14 172/108   2.) Iron deficiency anemia - Previous diagnosed with iron deficiency anemia and started on Ferrogels Forte. Takes the medication as prescribed and denies adverse side effects. Improvement of her symptoms of fatigue since starting the iron.   Allergies  Allergen Reactions  . Lisinopril Other (See Comments)    Cough      Current Outpatient Prescriptions on File Prior to Visit  Medication Sig Dispense Refill  . cloNIDine (CATAPRES) 0.1 MG tablet Take 1 tablet (0.1 mg total) by mouth 2 (two) times daily. 90 tablet 1  . ergocalciferol (VITAMIN D2) 50000 UNITS capsule Take 50,000 Units by mouth 2 (two) times a week.    . Fe Fum-Vit C-Vit B12-FA (FERROGELS FORTE) 460-60-0.01-1 MG CAPS capsule Take 1 capsule by mouth daily. 90 capsule 3  . Flaxseed, Linseed, (FLAX SEED OIL) 1000 MG CAPS Take 1 capsule by mouth daily.      . potassium chloride SA (K-DUR,KLOR-CON) 20 MEQ tablet Take 1 tablet (20 mEq total) by mouth 2 (two) times daily. 180 tablet 1   No current facility-administered medications on file prior to visit.    Review of Systems  Constitutional: Negative for fatigue.  Respiratory: Negative for chest tightness and shortness of breath.   Cardiovascular: Negative for chest pain, palpitations and leg swelling.  Neurological: Negative for headaches.     Objective:    BP 154/88 mmHg  Pulse 90  Temp(Src) 98 F (36.7 C) (Oral)  Resp 18  Ht 5\' 2"  (1.575 m)  Wt 277 lb (125.646 kg)  BMI 50.65 kg/m2  SpO2 98% Nursing note and vital signs reviewed.  Physical Exam  Constitutional: She is oriented to person, place, and time. She appears well-developed and well-nourished. No distress.  Cardiovascular: Normal rate, regular rhythm, normal heart sounds and intact distal pulses.   Pulmonary/Chest: Effort normal and breath sounds normal.  Neurological: She is alert and oriented to person, place, and time.  Skin: Skin is warm and dry.  Psychiatric: She has a normal mood and affect. Her behavior is normal. Judgment and thought content normal.       Assessment & Plan:   Problem List Items Addressed This Visit      Cardiovascular and Mediastinum   Essential hypertension - Primary    Hypertension remains elevated above goal of 140/90 with current regimen. Denies adverse side effects. Increase clonidine to 0.2 mgs nightly. Continue current dosage of chlorthalidone. Start amlodipine. Continue to monitor blood pressure at home. Follow up in 2 weeks for blood pressure check.       Relevant Medications   amLODipine (NORVASC) 10 MG tablet   chlorthalidone (HYGROTON) 25 MG tablet     Other   Iron deficiency anemia    Iron deficiency anemia maintained on Ferrogels Forte and has noted improvement in fatigue without adverse side  effects. Obtain IBC panel, ferritin and CBC. Continue current dosage of Ferrogels pending lab work.      Relevant Orders   CBC (Completed)   Ferritin (Completed)   IBC panel (Completed)

## 2014-12-28 NOTE — Assessment & Plan Note (Signed)
Hypertension remains elevated above goal of 140/90 with current regimen. Denies adverse side effects. Increase clonidine to 0.2 mgs nightly. Continue current dosage of chlorthalidone. Start amlodipine. Continue to monitor blood pressure at home. Follow up in 2 weeks for blood pressure check.

## 2014-12-28 NOTE — Progress Notes (Signed)
Pre visit review using our clinic review tool, if applicable. No additional management support is needed unless otherwise documented below in the visit note. 

## 2014-12-30 ENCOUNTER — Encounter: Payer: Self-pay | Admitting: Family

## 2015-01-02 ENCOUNTER — Telehealth: Payer: Self-pay | Admitting: Family

## 2015-01-02 NOTE — Telephone Encounter (Signed)
Please call and send mychart message*  Patient states that she was supposed to receive some pre surgical testing such as a cardiac stress test for her hysterectomy. Please review and follow up .

## 2015-01-02 NOTE — Telephone Encounter (Signed)
We are awaiting paperwork from her GYN provider. If she is need of stress test we will have to send her to cardiology to have that completed.

## 2015-01-03 NOTE — Telephone Encounter (Signed)
Patient was under the impression it was supposed to be happening. So i guess we go through with referral or order?   Lindsay Gomez,  Please notify the patient. Thank you.

## 2015-01-03 NOTE — Telephone Encounter (Signed)
Called pt back. Informed her that we have not received a packet of what needs to be done in her pre op visit. Advised her call Dr. Raphael Gibney to see what needs to be done and have them send over paperwork.

## 2015-01-08 ENCOUNTER — Other Ambulatory Visit: Payer: Self-pay | Admitting: Obstetrics and Gynecology

## 2015-01-09 ENCOUNTER — Ambulatory Visit (INDEPENDENT_AMBULATORY_CARE_PROVIDER_SITE_OTHER): Payer: BC Managed Care – PPO | Admitting: Family

## 2015-01-09 ENCOUNTER — Encounter: Payer: Self-pay | Admitting: Family

## 2015-01-09 VITALS — BP 150/104 | HR 90 | Temp 99.0°F | Resp 18 | Ht 62.0 in | Wt 277.8 lb

## 2015-01-09 DIAGNOSIS — Z01818 Encounter for other preprocedural examination: Secondary | ICD-10-CM | POA: Diagnosis not present

## 2015-01-09 DIAGNOSIS — I10 Essential (primary) hypertension: Secondary | ICD-10-CM | POA: Diagnosis not present

## 2015-01-09 MED ORDER — AMLODIPINE BESYLATE 10 MG PO TABS: 10.0000 mg | ORAL_TABLET | Freq: Every day | ORAL | Status: DC

## 2015-01-09 NOTE — Progress Notes (Signed)
   Subjective:    Patient ID: Lindsay Gomez, female    DOB: 02/02/1971, 44 y.o.   MRN: [id number]  Chief Complaint  Patient presents with  . Pre-op Exam    HPI:  Lindsay Gomez is a 44 y.o. female who  has a past medical history of Anemia; Hypertension; Allergy; and Morbid obesity ([place]). and presents today for a pre-op office visit.  She is scheduled to undergo a hysterectomy on 01/29/15 by Dr. Raphael Gibney.    Review of Systems  Constitutional: Negative.   HENT: Negative.   Eyes: Negative.   Respiratory: Negative.   Cardiovascular: Negative.   Gastrointestinal: Negative.   Endocrine: Negative.   Genitourinary: Negative.   Musculoskeletal: Negative.   Hematological: Negative.   Psychiatric/Behavioral: Negative.       Objective:    BP 150/104 mmHg  Pulse 90  Temp(Src) 100 F (37.8 C) (Oral)  Resp 18  Ht $R'5\' 2"'di$  (1.575 m)  Wt 277 lb 12.8 oz (126.009 kg)  BMI 50.80 kg/m2  SpO2 98% Nursing note and vital signs reviewed.  Physical Exam  Constitutional: She is oriented to person, place, and time. She appears well-developed and well-nourished. No distress.  HENT:  Head: Normocephalic.  Right Ear: Hearing, tympanic membrane, external ear and ear canal normal.  Left Ear: Hearing, tympanic membrane, external ear and ear canal normal.  Nose: Nose normal.  Mouth/Throat: Uvula is midline, oropharynx is clear and moist and mucous membranes are normal.  Eyes: Conjunctivae and EOM are normal. Pupils are equal, round, and reactive to light.  Neck: Neck supple. No JVD present. No tracheal deviation present. No thyromegaly present.  Cardiovascular: Normal rate, regular rhythm, normal heart sounds and intact distal pulses.   Pulmonary/Chest: Effort normal and breath sounds normal.  Abdominal: Soft. Bowel sounds are normal. She exhibits no distension and no mass. There is no tenderness. There is no rebound and no guarding.  Musculoskeletal: Normal range of motion. She exhibits no  edema or tenderness.  Lymphadenopathy:    She has no cervical adenopathy.  Neurological: She is alert and oriented to person, place, and time. She has normal reflexes. No cranial nerve deficit. She exhibits normal muscle tone. Coordination normal.  Skin: Skin is warm and dry.  Psychiatric: She has a normal mood and affect. Her behavior is normal. Judgment and thought content normal.       Assessment & Plan:   Problem List Items Addressed This Visit      Cardiovascular and Mediastinum   Essential hypertension - Primary   Relevant Medications   amLODipine (NORVASC) 10 MG tablet   Other Relevant Orders   Comp Met (CMET)     Other   Pre-op evaluation    Pre-operative evaluation completed. EKG today shows evidence of left ventricular hypertrophy which is most likely related to her history of hypertension. Previous 2D Echo from 2013 with moderate LVH. Consider new 2D echo. Previous CBC consistent with improving anemia which is improving with her iron. Obtain CMET to check kidney, electrolyte and liver dysfunction. Recheck blood pressure at nurse visit after starting amlodipine. Clearance for surgery will be pending the blood work results and blood pressure follow up.       Relevant Orders   Comp Met (CMET)   EKG 12-Lead (Completed)

## 2015-01-09 NOTE — Assessment & Plan Note (Signed)
Pre-operative evaluation completed. EKG today shows evidence of left ventricular hypertrophy which is most likely related to her history of hypertension. Previous 2D Echo from 2013 with moderate LVH. Consider new 2D echo. Previous CBC consistent with improving anemia which is improving with her iron. Obtain CMET to check kidney, electrolyte and liver dysfunction. Recheck blood pressure at nurse visit after starting amlodipine. Clearance for surgery will be pending the blood work results and blood pressure follow up.

## 2015-01-09 NOTE — Progress Notes (Signed)
Pre visit review using our clinic review tool, if applicable. No additional management support is needed unless otherwise documented below in the visit note. 

## 2015-01-09 NOTE — Patient Instructions (Signed)
Thank you for choosing [facility].  Summary/Instructions:  Your prescription(s) have been submitted to your pharmacy or been printed and provided for you. Please take as directed and contact our office if you believe you are having problem(s) with the medication(s) or have any questions.  Please stop by the lab on the basement level of the building for your blood work. Your results will be released to MyChart (or called to you) after review, usually within 72 hours after test completion. If any changes need to be made, you will be notified at that same time.  Please stop by radiology on the basement level of the building for your x-rays. Your results will be released to MyChart (or called to you) after review, usually within 72 hours after test completion. If any treatments or changes are necessary, you will be notified at that same time.  Referrals have been made during this visit. You should expect to hear back from our schedulers in about 7-10 days in regards to establishing an appointment with the specialists we discussed.   If your symptoms worsen or fail to improve, please contact our office for further instruction, or in case of emergency go directly to the emergency room at the closest medical facility.     

## 2015-01-14 ENCOUNTER — Ambulatory Visit: Payer: BC Managed Care – PPO

## 2015-01-14 VITALS — BP 166/100

## 2015-01-14 DIAGNOSIS — I10 Essential (primary) hypertension: Secondary | ICD-10-CM

## 2015-01-14 MED ORDER — AZILSARTAN MEDOXOMIL 40 MG PO TABS: 40.0000 mg | ORAL_TABLET | Freq: Every day | ORAL | Status: DC

## 2015-01-14 NOTE — Telephone Encounter (Signed)
Please have her start on Edarbi which I will send to [facility] where we have a card that she can get it inexpensively.

## 2015-01-14 NOTE — Telephone Encounter (Signed)
-----   Message from Ander Slade, RN sent at 01/14/2015 10:45 AM EDT ----- Regarding: bp recheck on 10/31 Patients bp today was 166/100---sx is scheduled for nov 15th, please advise on adjustments to bp meds, i will call patient back, thanks

## 2015-01-17 ENCOUNTER — Encounter (HOSPITAL_COMMUNITY)
Admission: RE | Admit: 2015-01-17 | Discharge: 2015-01-17 | Disposition: A | Discharge status: Home / Self Care | Payer: BC Managed Care – PPO | Source: Ambulatory Visit | Attending: Obstetrics and Gynecology | Admitting: Obstetrics and Gynecology

## 2015-01-17 ENCOUNTER — Telehealth: Payer: Self-pay | Admitting: Family

## 2015-01-17 DIAGNOSIS — I10 Essential (primary) hypertension: Secondary | ICD-10-CM

## 2015-01-17 MED ORDER — AZILSARTAN MEDOXOMIL 40 MG PO TABS: 40.0000 mg | ORAL_TABLET | Freq: Every day | ORAL | Status: DC

## 2015-01-17 MED ORDER — ALPRAZOLAM 0.25 MG PO TABS: 0.2500 mg | ORAL_TABLET | Freq: Two times a day (BID) | ORAL | Status: DC | PRN

## 2015-01-17 NOTE — Telephone Encounter (Signed)
Called pt back. Sent new medication to the correct pharmacy. Pt is requesting that you please give her a call. Her BP is still elevated and she is stressing out about surgery.

## 2015-01-17 NOTE — Telephone Encounter (Signed)
Is requesting a call back before 5pm.  States bp has been high and also states that last med a diuretic went to [facility] and should have went to [place] at [facility].

## 2015-01-17 NOTE — Telephone Encounter (Signed)
Spoke with patient on the phone regarding medication management. Encouraged to start new medication. Question possible underlying anxiety. Start low dose Xanax. Follow up pending trial of medication.

## 2015-01-17 NOTE — Pre-Procedure Instructions (Signed)
Patient here for PAT appt. VS taken and BP was 101/61, 10 minutes later 176/110, then 170/100 and finally 160/106. PAT rescheduled for next week to give new NTN med more time to work. Notes in EPIC from her PMD state that he will clear pt for surgery once her BP is lower.

## 2015-01-17 NOTE — Addendum Note (Signed)
Addended by: Mauricio Po D on: 01/17/2015 05:12 PM   Modules accepted: Orders

## 2015-01-22 ENCOUNTER — Encounter (HOSPITAL_COMMUNITY)
Admission: RE | Admit: 2015-01-22 | Discharge: 2015-01-22 | Disposition: A | Discharge status: Home / Self Care | Payer: BC Managed Care – PPO | Source: Ambulatory Visit | Attending: Obstetrics and Gynecology | Admitting: Obstetrics and Gynecology

## 2015-01-22 ENCOUNTER — Encounter (HOSPITAL_COMMUNITY): Payer: Self-pay

## 2015-01-22 DIAGNOSIS — D649 Anemia, unspecified: Secondary | ICD-10-CM | POA: Diagnosis not present

## 2015-01-22 DIAGNOSIS — D259 Leiomyoma of uterus, unspecified: Secondary | ICD-10-CM | POA: Insufficient documentation

## 2015-01-22 DIAGNOSIS — Z01818 Encounter for other preprocedural examination: Secondary | ICD-10-CM | POA: Insufficient documentation

## 2015-01-22 DIAGNOSIS — N920 Excessive and frequent menstruation with regular cycle: Secondary | ICD-10-CM | POA: Insufficient documentation

## 2015-01-22 HISTORY — DX: Anxiety disorder, unspecified: F41.9

## 2015-01-22 HISTORY — DX: Cardiomegaly: I51.7

## 2015-01-22 HISTORY — DX: Reserved for inherently not codable concepts without codable children: IMO0001

## 2015-01-22 LAB — CBC
HCT: 42.7 % (ref 36.0–46.0)
Hemoglobin: 12.7 g/dL (ref 12.0–15.0)
MCH: 23.1 pg — ABNORMAL LOW (ref 26.0–34.0)
MCHC: 29.7 g/dL — ABNORMAL LOW (ref 30.0–36.0)
MCV: 77.8 fL — ABNORMAL LOW (ref 78.0–100.0)
Platelets: 286 10*3/uL (ref 150–400)
RBC: 5.49 MIL/uL — ABNORMAL HIGH (ref 3.87–5.11)
RDW: 21.1 % — ABNORMAL HIGH (ref 11.5–15.5)
WBC: 5.7 10*3/uL (ref 4.0–10.5)

## 2015-01-22 LAB — BASIC METABOLIC PANEL
Anion gap: 8 (ref 5–15)
BUN: 10 mg/dL (ref 6–20)
CO2: 32 mmol/L (ref 22–32)
Calcium: 9.4 mg/dL (ref 8.9–10.3)
Chloride: 96 mmol/L — ABNORMAL LOW (ref 101–111)
Creatinine, Ser: 0.63 mg/dL (ref 0.44–1.00)
GFR calc Af Amer: >60 mL/min (ref >60)
GFR calc non Af Amer: >60 mL/min (ref >60)
Glucose, Bld: 146 mg/dL — ABNORMAL HIGH (ref 65–99)
Potassium: 3 mmol/L — ABNORMAL LOW (ref 3.5–5.1)
Sodium: 136 mmol/L (ref 135–145)

## 2015-01-22 NOTE — Patient Instructions (Addendum)
Your procedure is scheduled on:  Tuesday, Nov. 15, 2016  Enter through the [employer] [facility] at:  6:00 A.M.  Pick up the phone at the desk and dial [phone].  Call this number if you have problems the morning of surgery: [phone].  Remember: Do NOT eat food or drink after:  Midnight Monday Take these medicines the morning of surgery with a SIP OF WATER:  AMLODIPINE, AZILSARTAN, POTASSIUM, XANAX IF NEEDED  Do NOT wear jewelry (body piercing), metal hair clips/bobby pins, make-up, or nail polish. Do NOT wear lotions, powders, or perfumes.  You may wear deoderant. Do NOT shave for 48 hours prior to surgery. Do NOT bring valuables to the hospital. Contacts, dentures, or bridgework may not be worn into surgery. Leave suitcase in car.  After surgery it may be brought to your room.  For patients admitted to the hospital, checkout time is 11:00 AM the day of discharge.

## 2015-01-22 NOTE — Pre-Procedure Instructions (Signed)
Left message on machine for Lindsay Gomez at Dr. Marchelle Gearing office in reference to Potassium 3.0.

## 2015-01-22 NOTE — Pre-Procedure Instructions (Signed)
Patient requested to speak with pastoral care day of surgery.  I spoke with Dominga Ferry to schedule that for day of surgery.

## 2015-01-28 ENCOUNTER — Encounter (HOSPITAL_COMMUNITY): Payer: Self-pay | Admitting: Anesthesiology

## 2015-01-28 NOTE — Anesthesia Preprocedure Evaluation (Addendum)
Anesthesia Evaluation  Patient identified by MRN, date of birth, ID band Patient awake    Reviewed: Allergy & Precautions, NPO status , Patient's Chart, lab work & pertinent test results, reviewed documented beta blocker date and time   Airway Mallampati: III  TM Distance: >3 FB Neck ROM: Full    Dental no notable dental hx. (+) Caps, Loose,    Pulmonary neg pulmonary ROS, shortness of breath and with exertion,    Pulmonary exam normal breath sounds clear to auscultation       Cardiovascular hypertension, Pt. on medications Normal cardiovascular exam Rhythm:Regular Rate:Normal  LVH on echo   Neuro/Psych Anxiety  Neuromuscular disease    GI/Hepatic Neg liver ROS,   Endo/Other  Morbid obesity  Renal/GU negative Renal ROS     Musculoskeletal Right lumbar radiculopathy   Abdominal   Peds  Hematology  (+) anemia ,   Anesthesia Other Findings   Reproductive/Obstetrics Uterine fibroids Menorrhagia Anemia                             Anesthesia Physical Anesthesia Plan  ASA: III  Anesthesia Plan: General   Post-op Pain Management:    Induction: Intravenous  Airway Management Planned: Oral ETT  Additional Equipment:   Intra-op Plan:   Post-operative Plan: Extubation in OR  Informed Consent: I have reviewed the patients History and Physical, chart, labs and discussed the procedure including the risks, benefits and alternatives for the proposed anesthesia with the patient or authorized representative who has indicated his/her understanding and acceptance.   Dental advisory given  Plan Discussed with: CRNA, Anesthesiologist and Surgeon  Anesthesia Plan Comments:         Anesthesia Quick Evaluation

## 2015-01-28 NOTE — H&P (Signed)
  Admission History and Physical Exam for a Gynecology Patient  Lindsay Gomez is a 44 y.o. female who presents for a total abdominal hysterectomy and bilateral salpingectomy. She has been followed at the [facility] and Gynecology division of [facility]. She has a known history of fibroids.  Her endometrial biopsy is benign.  Her most recent Pap smear is within normal limits. She has been treated with OCP's and Lysteda.  OB History    No data available      Past Medical History  Diagnosis Date  . Anemia   . Hypertension   . Allergy     rhinitis  . Morbid obesity ([place])   . Enlarged heart   . Shortness of breath dyspnea     with low iron  . Anxiety     No prescriptions prior to admission    Past Surgical History  Procedure Laterality Date  . Knee surgery  1992    ? arthroscopic/ right  . Cesarean section      Allergies  Allergen Reactions  . Lisinopril Other (See Comments)    Cough     Family History: family history includes Coronary artery disease in her father; Heart attack in her father; Heart failure in her father; Hyperlipidemia in her other; Hypertension in her mother and other.  Social History:  reports that she has never smoked. She has never used smokeless tobacco. She reports that she does not drink alcohol or use illicit drugs.  Review of systems: See HPI.  Admission Physical Exam:    BMI equals 51.  HEENT:                 Within normal limits Chest:                   Clear Heart:                    Regular rate and rhythm Breasts:                No masses, skin changes, bleeding, or discharge present Abdomen:             Nontender, no masses Extremities:          Grossly normal Neurologic exam: Grossly normal  Pelvic exam:  External genitalia: normal general appearance Vaginal: normal without tenderness, induration or masses Cervix: normal appearance Adnexa: normal bimanual exam Uterus: 14 week size,  irregular Rectal: good sphincter tone  Assessment:  Menorrhagia  Fibroid uterus  History of anemia  Obesity (BMI equals 51)  Anxiety  Hypertension  Hypokalemia  Plan:  The patient will undergo a total abdominal hysterectomy and bilateral salpingectomy.  She understands the indications for surgical procedure, as well as her alternative treatment options.  She accepts the risks of, but not limited to, anesthetic complications, bleeding, infection, and possible damage to the surrounding organs.   Eli Hose 01/28/2015

## 2015-01-29 ENCOUNTER — Encounter (HOSPITAL_COMMUNITY): Payer: Self-pay | Admitting: Anesthesiology

## 2015-01-29 ENCOUNTER — Encounter (HOSPITAL_COMMUNITY)
Admission: RE | Disposition: A | Discharge status: Home / Self Care | Payer: Self-pay | Source: Ambulatory Visit | Attending: Obstetrics and Gynecology

## 2015-01-29 ENCOUNTER — Inpatient Hospital Stay (HOSPITAL_COMMUNITY): Payer: BC Managed Care – PPO | Admitting: Anesthesiology

## 2015-01-29 ENCOUNTER — Inpatient Hospital Stay (HOSPITAL_COMMUNITY)
Admission: RE | Admit: 2015-01-29 | Discharge: 2015-01-31 | DRG: 742 | Disposition: A | Discharge status: Home / Self Care | Payer: BC Managed Care – PPO | Source: Ambulatory Visit | Attending: Obstetrics and Gynecology | Admitting: Obstetrics and Gynecology

## 2015-01-29 DIAGNOSIS — F419 Anxiety disorder, unspecified: Secondary | ICD-10-CM | POA: Diagnosis present

## 2015-01-29 DIAGNOSIS — I10 Essential (primary) hypertension: Secondary | ICD-10-CM | POA: Diagnosis present

## 2015-01-29 DIAGNOSIS — D649 Anemia, unspecified: Secondary | ICD-10-CM | POA: Diagnosis present

## 2015-01-29 DIAGNOSIS — D259 Leiomyoma of uterus, unspecified: Secondary | ICD-10-CM | POA: Diagnosis present

## 2015-01-29 DIAGNOSIS — N920 Excessive and frequent menstruation with regular cycle: Principal | ICD-10-CM | POA: Diagnosis present

## 2015-01-29 DIAGNOSIS — Z6841 Body Mass Index (BMI) 40.0 and over, adult: Secondary | ICD-10-CM

## 2015-01-29 DIAGNOSIS — E876 Hypokalemia: Secondary | ICD-10-CM | POA: Diagnosis present

## 2015-01-29 HISTORY — PX: BILATERAL SALPINGECTOMY: SHX5743

## 2015-01-29 HISTORY — PX: ABDOMINAL HYSTERECTOMY: SHX81

## 2015-01-29 LAB — PREGNANCY, URINE: Preg Test, Ur: NEGATIVE

## 2015-01-29 LAB — CREATININE, SERUM
Creatinine, Ser: 0.72 mg/dL (ref 0.44–1.00)
GFR calc Af Amer: >60 mL/min (ref >60)
GFR calc non Af Amer: >60 mL/min (ref >60)

## 2015-01-29 SURGERY — HYSTERECTOMY, ABDOMINAL
Anesthesia: General | Site: Abdomen

## 2015-01-29 MED ORDER — HEPARIN SODIUM (PORCINE) 5000 UNIT/ML IJ SOLN
5000.0000 [IU] | Freq: Once | INTRAMUSCULAR | Status: AC
  Administered 2015-01-29: 5000 [IU] via SUBCUTANEOUS
  Filled 2015-01-29: qty 1

## 2015-01-29 MED ORDER — CHLORTHALIDONE 25 MG PO TABS
25.0000 mg | ORAL_TABLET | Freq: Every day | ORAL | Status: DC
  Administered 2015-01-30: 25 mg via ORAL
  Filled 2015-01-29 (×2): qty 1

## 2015-01-29 MED ORDER — KETOROLAC TROMETHAMINE 30 MG/ML IJ SOLN
INTRAMUSCULAR | Status: DC | PRN
  Administered 2015-01-29: 30 mg via INTRAVENOUS

## 2015-01-29 MED ORDER — PROPOFOL 10 MG/ML IV BOLUS
INTRAVENOUS | Status: DC | PRN
  Administered 2015-01-29: 200 mg via INTRAVENOUS

## 2015-01-29 MED ORDER — DEXAMETHASONE SODIUM PHOSPHATE 10 MG/ML IJ SOLN
INTRAMUSCULAR | Status: DC | PRN
  Administered 2015-01-29: 4 mg via INTRAVENOUS

## 2015-01-29 MED ORDER — ONDANSETRON HCL 4 MG PO TABS: 4.0000 mg | ORAL_TABLET | Freq: Three times a day (TID) | ORAL | Status: DC | PRN

## 2015-01-29 MED ORDER — KETOROLAC TROMETHAMINE 30 MG/ML IJ SOLN
INTRAMUSCULAR | Status: AC
  Filled 2015-01-29: qty 1

## 2015-01-29 MED ORDER — LACTATED RINGERS IV SOLN
INTRAVENOUS | Status: DC
  Administered 2015-01-29 (×2): via INTRAVENOUS

## 2015-01-29 MED ORDER — LIDOCAINE HCL (CARDIAC) 20 MG/ML IV SOLN
INTRAVENOUS | Status: DC | PRN
  Administered 2015-01-29: 70 mg via INTRAVENOUS
  Administered 2015-01-29: 30 mg via INTRAVENOUS

## 2015-01-29 MED ORDER — MEPERIDINE HCL 25 MG/ML IJ SOLN
6.2500 mg | INTRAMUSCULAR | Status: DC | PRN
  Administered 2015-01-29: 6.25 mg via INTRAVENOUS

## 2015-01-29 MED ORDER — FENTANYL CITRATE (PF) 250 MCG/5ML IJ SOLN
INTRAMUSCULAR | Status: AC
  Filled 2015-01-29: qty 25

## 2015-01-29 MED ORDER — DIPHENHYDRAMINE HCL 12.5 MG/5ML PO ELIX: 12.5000 mg | ORAL_SOLUTION | Freq: Four times a day (QID) | ORAL | Status: DC | PRN

## 2015-01-29 MED ORDER — FENTANYL CITRATE (PF) 100 MCG/2ML IJ SOLN
25.0000 ug | INTRAMUSCULAR | Status: DC | PRN
  Administered 2015-01-29 (×2): 50 ug via INTRAVENOUS

## 2015-01-29 MED ORDER — ONDANSETRON HCL 4 MG/2ML IJ SOLN
4.0000 mg | Freq: Four times a day (QID) | INTRAMUSCULAR | Status: DC | PRN
  Administered 2015-01-29: 4 mg via INTRAVENOUS
  Filled 2015-01-29: qty 2

## 2015-01-29 MED ORDER — FENTANYL CITRATE (PF) 100 MCG/2ML IJ SOLN
INTRAMUSCULAR | Status: AC
  Administered 2015-01-29: 50 ug via INTRAVENOUS
  Filled 2015-01-29: qty 2

## 2015-01-29 MED ORDER — OXYCODONE-ACETAMINOPHEN 5-325 MG PO TABS
1.0000 | ORAL_TABLET | ORAL | Status: DC | PRN
  Administered 2015-01-30: 1 via ORAL
  Administered 2015-01-30: 2 via ORAL
  Administered 2015-01-31: 1 via ORAL
  Administered 2015-01-31: 2 via ORAL
  Filled 2015-01-29: qty 2
  Filled 2015-01-29: qty 1
  Filled 2015-01-29: qty 2
  Filled 2015-01-29: qty 1

## 2015-01-29 MED ORDER — LIDOCAINE HCL (CARDIAC) 20 MG/ML IV SOLN
INTRAVENOUS | Status: AC
  Filled 2015-01-29: qty 5

## 2015-01-29 MED ORDER — PHENYLEPHRINE 40 MCG/ML (10ML) SYRINGE FOR IV PUSH (FOR BLOOD PRESSURE SUPPORT)
PREFILLED_SYRINGE | INTRAVENOUS | Status: AC
  Filled 2015-01-29: qty 10

## 2015-01-29 MED ORDER — SCOPOLAMINE 1 MG/3DAYS TD PT72
MEDICATED_PATCH | TRANSDERMAL | Status: AC
  Administered 2015-01-29: 1.5 mg via TRANSDERMAL
  Filled 2015-01-29: qty 1

## 2015-01-29 MED ORDER — NEOSTIGMINE METHYLSULFATE 10 MG/10ML IV SOLN
INTRAVENOUS | Status: AC
  Filled 2015-01-29: qty 1

## 2015-01-29 MED ORDER — IRBESARTAN 75 MG PO TABS
75.0000 mg | ORAL_TABLET | Freq: Every day | ORAL | Status: DC
  Administered 2015-01-30: 75 mg via ORAL
  Filled 2015-01-29 (×2): qty 1

## 2015-01-29 MED ORDER — BUPIVACAINE-EPINEPHRINE 0.5% -1:200000 IJ SOLN
INTRAMUSCULAR | Status: DC | PRN
  Administered 2015-01-29: 20 mL

## 2015-01-29 MED ORDER — GLYCOPYRROLATE 0.2 MG/ML IJ SOLN
INTRAMUSCULAR | Status: DC | PRN
  Administered 2015-01-29: 0.6 mg via INTRAVENOUS

## 2015-01-29 MED ORDER — HEPARIN SODIUM (PORCINE) 5000 UNIT/ML IJ SOLN
5000.0000 [IU] | Freq: Three times a day (TID) | INTRAMUSCULAR | Status: DC
  Administered 2015-01-29 – 2015-01-31 (×6): 5000 [IU] via SUBCUTANEOUS
  Filled 2015-01-29 (×7): qty 1

## 2015-01-29 MED ORDER — DIPHENHYDRAMINE HCL 50 MG/ML IJ SOLN: 12.5000 mg | Freq: Four times a day (QID) | INTRAMUSCULAR | Status: DC | PRN

## 2015-01-29 MED ORDER — SODIUM CHLORIDE 0.9 % IJ SOLN
INTRAMUSCULAR | Status: AC
  Filled 2015-01-29: qty 100

## 2015-01-29 MED ORDER — ONDANSETRON HCL 4 MG/2ML IJ SOLN
INTRAMUSCULAR | Status: DC | PRN
  Administered 2015-01-29: 4 mg via INTRAVENOUS

## 2015-01-29 MED ORDER — CEFAZOLIN SODIUM 1-5 GM-% IV SOLN
1.0000 g | Freq: Once | INTRAVENOUS | Status: DC
  Filled 2015-01-29: qty 50

## 2015-01-29 MED ORDER — AMLODIPINE BESYLATE 10 MG PO TABS
10.0000 mg | ORAL_TABLET | Freq: Every day | ORAL | Status: DC
  Administered 2015-01-30 – 2015-01-31 (×2): 10 mg via ORAL
  Filled 2015-01-29 (×2): qty 1

## 2015-01-29 MED ORDER — FENTANYL CITRATE (PF) 100 MCG/2ML IJ SOLN
INTRAMUSCULAR | Status: AC
  Filled 2015-01-29: qty 4

## 2015-01-29 MED ORDER — PROPOFOL 10 MG/ML IV BOLUS
INTRAVENOUS | Status: AC
  Filled 2015-01-29: qty 20

## 2015-01-29 MED ORDER — KETOROLAC TROMETHAMINE 30 MG/ML IJ SOLN
30.0000 mg | Freq: Four times a day (QID) | INTRAMUSCULAR | Status: DC
  Administered 2015-01-29 – 2015-01-30 (×5): 30 mg via INTRAVENOUS
  Filled 2015-01-29 (×6): qty 1

## 2015-01-29 MED ORDER — BUPIVACAINE-EPINEPHRINE (PF) 0.5% -1:200000 IJ SOLN
INTRAMUSCULAR | Status: AC
  Filled 2015-01-29: qty 30

## 2015-01-29 MED ORDER — EPHEDRINE SULFATE 50 MG/ML IJ SOLN
INTRAMUSCULAR | Status: DC | PRN
  Administered 2015-01-29: 10 mg via INTRAVENOUS
  Administered 2015-01-29: 5 mg via INTRAVENOUS
  Administered 2015-01-29: 10 mg via INTRAVENOUS

## 2015-01-29 MED ORDER — CEFAZOLIN SODIUM-DEXTROSE 2-3 GM-% IV SOLR
INTRAVENOUS | Status: AC
  Administered 2015-01-29: 3 g via INTRAVENOUS
  Filled 2015-01-29: qty 50

## 2015-01-29 MED ORDER — SODIUM CHLORIDE 0.9 % IJ SOLN: 9.0000 mL | INTRAMUSCULAR | Status: DC | PRN

## 2015-01-29 MED ORDER — LACTATED RINGERS IV SOLN
INTRAVENOUS | Status: DC
  Administered 2015-01-29 – 2015-01-30 (×3): via INTRAVENOUS

## 2015-01-29 MED ORDER — CEFAZOLIN SODIUM-DEXTROSE 2-3 GM-% IV SOLR: 2.0000 g | INTRAVENOUS | Status: DC

## 2015-01-29 MED ORDER — ROCURONIUM BROMIDE 100 MG/10ML IV SOLN
INTRAVENOUS | Status: DC | PRN
  Administered 2015-01-29: 10 mg via INTRAVENOUS
  Administered 2015-01-29: 50 mg via INTRAVENOUS
  Administered 2015-01-29: 5 mg via INTRAVENOUS

## 2015-01-29 MED ORDER — NALOXONE HCL 0.4 MG/ML IJ SOLN: 0.4000 mg | INTRAMUSCULAR | Status: DC | PRN

## 2015-01-29 MED ORDER — NEOSTIGMINE METHYLSULFATE 10 MG/10ML IV SOLN
INTRAVENOUS | Status: DC | PRN
  Administered 2015-01-29: 4 mg via INTRAVENOUS

## 2015-01-29 MED ORDER — MIDAZOLAM HCL 2 MG/2ML IJ SOLN
INTRAMUSCULAR | Status: AC
  Filled 2015-01-29: qty 4

## 2015-01-29 MED ORDER — MIDAZOLAM HCL 2 MG/2ML IJ SOLN
INTRAMUSCULAR | Status: DC | PRN
  Administered 2015-01-29: 2 mg via INTRAVENOUS

## 2015-01-29 MED ORDER — 0.9 % SODIUM CHLORIDE (POUR BTL) OPTIME
TOPICAL | Status: DC | PRN
  Administered 2015-01-29: 1000 mL

## 2015-01-29 MED ORDER — METOCLOPRAMIDE HCL 5 MG/ML IJ SOLN: 10.0000 mg | Freq: Once | INTRAMUSCULAR | Status: DC | PRN

## 2015-01-29 MED ORDER — DEXAMETHASONE SODIUM PHOSPHATE 4 MG/ML IJ SOLN
INTRAMUSCULAR | Status: AC
  Filled 2015-01-29: qty 1

## 2015-01-29 MED ORDER — HYDROMORPHONE 1 MG/ML IV SOLN
INTRAVENOUS | Status: DC
  Administered 2015-01-29: 1.5 mg via INTRAVENOUS
  Administered 2015-01-29: 1.8 mg via INTRAVENOUS
  Administered 2015-01-29: 2.4 mg via INTRAVENOUS
  Administered 2015-01-29: 12:00:00 via INTRAVENOUS
  Administered 2015-01-30: 0.6 mg via INTRAVENOUS
  Administered 2015-01-30 (×2): 0.9 mg via INTRAVENOUS
  Filled 2015-01-29: qty 25

## 2015-01-29 MED ORDER — CLONIDINE HCL 0.1 MG PO TABS
0.1000 mg | ORAL_TABLET | Freq: Two times a day (BID) | ORAL | Status: DC
  Administered 2015-01-30 – 2015-01-31 (×2): 0.1 mg via ORAL
  Filled 2015-01-29 (×5): qty 1

## 2015-01-29 MED ORDER — MENTHOL 3 MG MT LOZG
1.0000 | LOZENGE | OROMUCOSAL | Status: DC | PRN
  Administered 2015-01-31: 3 mg via ORAL
  Filled 2015-01-29: qty 9

## 2015-01-29 MED ORDER — ROCURONIUM BROMIDE 100 MG/10ML IV SOLN
INTRAVENOUS | Status: AC
  Filled 2015-01-29: qty 1

## 2015-01-29 MED ORDER — POTASSIUM CHLORIDE CRYS ER 20 MEQ PO TBCR
20.0000 meq | EXTENDED_RELEASE_TABLET | Freq: Two times a day (BID) | ORAL | Status: DC
  Administered 2015-01-29 – 2015-01-30 (×3): 20 meq via ORAL
  Filled 2015-01-29 (×5): qty 1

## 2015-01-29 MED ORDER — SCOPOLAMINE 1 MG/3DAYS TD PT72
1.0000 | MEDICATED_PATCH | Freq: Once | TRANSDERMAL | Status: DC
  Administered 2015-01-29: 1.5 mg via TRANSDERMAL

## 2015-01-29 MED ORDER — IBUPROFEN 800 MG PO TABS: 800.0000 mg | ORAL_TABLET | Freq: Three times a day (TID) | ORAL | Status: DC

## 2015-01-29 MED ORDER — MEPERIDINE HCL 25 MG/ML IJ SOLN
INTRAMUSCULAR | Status: AC
  Administered 2015-01-29: 6.25 mg via INTRAVENOUS
  Filled 2015-01-29: qty 1

## 2015-01-29 MED ORDER — DOCUSATE SODIUM 100 MG PO CAPS
100.0000 mg | ORAL_CAPSULE | Freq: Two times a day (BID) | ORAL | Status: DC
  Administered 2015-01-30: 100 mg via ORAL
  Filled 2015-01-29: qty 1

## 2015-01-29 MED ORDER — GLYCOPYRROLATE 0.2 MG/ML IJ SOLN
INTRAMUSCULAR | Status: AC
  Filled 2015-01-29: qty 3

## 2015-01-29 MED ORDER — VASOPRESSIN 20 UNIT/ML IV SOLN
INTRAVENOUS | Status: AC
  Filled 2015-01-29: qty 1

## 2015-01-29 MED ORDER — PHENYLEPHRINE HCL 10 MG/ML IJ SOLN
INTRAMUSCULAR | Status: DC | PRN
  Administered 2015-01-29: 80 ug via INTRAVENOUS
  Administered 2015-01-29: 40 ug via INTRAVENOUS
  Administered 2015-01-29: 80 ug via INTRAVENOUS

## 2015-01-29 MED ORDER — FENTANYL CITRATE (PF) 100 MCG/2ML IJ SOLN
INTRAMUSCULAR | Status: DC | PRN
  Administered 2015-01-29 (×6): 50 ug via INTRAVENOUS

## 2015-01-29 SURGICAL SUPPLY — 50 items
CANISTER SUCT 3000ML (MISCELLANEOUS) ×3 IMPLANT
CATH FOLEY 3WAY 30CC 16FR (CATHETERS) IMPLANT
CLOTH BEACON ORANGE TIMEOUT ST (SAFETY) ×3 IMPLANT
CONT PATH 16OZ SNAP LID 3702 (MISCELLANEOUS) ×3 IMPLANT
DECANTER SPIKE VIAL GLASS SM (MISCELLANEOUS) ×3 IMPLANT
DRAPE CESAREAN BIRTH W POUCH (DRAPES) ×3 IMPLANT
DRAPE WARM FLUID 44X44 (DRAPE) IMPLANT
DRSG OPSITE POSTOP 4X10 (GAUZE/BANDAGES/DRESSINGS) ×3 IMPLANT
DRSG TELFA 3X8 NADH (GAUZE/BANDAGES/DRESSINGS) ×3 IMPLANT
DURAPREP 26ML APPLICATOR (WOUND CARE) ×3 IMPLANT
FORMULA 20CAL 3 OZ MEAD (FORMULA) IMPLANT
GAUZE SPONGE 4X4 12PLY STRL (GAUZE/BANDAGES/DRESSINGS) ×3 IMPLANT
GAUZE SPONGE 4X4 16PLY XRAY LF (GAUZE/BANDAGES/DRESSINGS) ×3 IMPLANT
GLOVE BIOGEL PI IND STRL 7.0 (GLOVE) ×6 IMPLANT
GLOVE BIOGEL PI IND STRL 8.5 (GLOVE) ×2 IMPLANT
GLOVE BIOGEL PI INDICATOR 7.0 (GLOVE) ×3
GLOVE BIOGEL PI INDICATOR 8.5 (GLOVE) ×1
GLOVE ECLIPSE 8.0 STRL XLNG CF (GLOVE) ×6 IMPLANT
GOWN STRL REUS W/TWL 2XL LVL3 (GOWN DISPOSABLE) ×3 IMPLANT
GOWN STRL REUS W/TWL LRG LVL3 (GOWN DISPOSABLE) ×6 IMPLANT
NEEDLE HYPO 18GX1.5 BLUNT FILL (NEEDLE) IMPLANT
NEEDLE HYPO 22GX1.5 SAFETY (NEEDLE) ×3 IMPLANT
NS IRRIG 1000ML POUR BTL (IV SOLUTION) ×3 IMPLANT
PACK ABDOMINAL GYN (CUSTOM PROCEDURE TRAY) ×3 IMPLANT
PAD ABD 7.5X8 STRL (GAUZE/BANDAGES/DRESSINGS) ×3 IMPLANT
PAD OB MATERNITY 4.3X12.25 (PERSONAL CARE ITEMS) ×3 IMPLANT
PENCIL SMOKE EVAC W/HOLSTER (ELECTROSURGICAL) ×3 IMPLANT
PLUG CATH AND CAP STER (CATHETERS) IMPLANT
PROTECTOR NERVE ULNAR (MISCELLANEOUS) ×3 IMPLANT
RTRCTR C-SECT PINK 25CM LRG (MISCELLANEOUS) ×3 IMPLANT
SPONGE LAP 18X18 X RAY DECT (DISPOSABLE) ×6 IMPLANT
STAPLER VISISTAT 35W (STAPLE) ×3 IMPLANT
SUT CHROMIC 3 0 SH 27 (SUTURE) ×3 IMPLANT
SUT MNCRL AB 3-0 PS2 27 (SUTURE) IMPLANT
SUT PDS AB 1 CTX 36 (SUTURE) IMPLANT
SUT VIC AB 0 CT1 18XCR BRD8 (SUTURE) ×4 IMPLANT
SUT VIC AB 0 CT1 27 (SUTURE) ×2
SUT VIC AB 0 CT1 27XBRD ANBCTR (SUTURE) ×4 IMPLANT
SUT VIC AB 0 CT1 8-18 (SUTURE) ×2
SUT VIC AB 2-0 CT1 27 (SUTURE)
SUT VIC AB 2-0 CT1 TAPERPNT 27 (SUTURE) IMPLANT
SUT VIC AB 3-0 CT1 27 (SUTURE) ×1
SUT VIC AB 3-0 CT1 TAPERPNT 27 (SUTURE) ×2 IMPLANT
SUT VIC AB 4-0 PS2 27 (SUTURE) IMPLANT
SUT VICRYL 0 TIES 12 18 (SUTURE) ×3 IMPLANT
SYR 30ML LL (SYRINGE) IMPLANT
SYR CONTROL 10ML LL (SYRINGE) ×3 IMPLANT
TOWEL OR 17X24 6PK STRL BLUE (TOWEL DISPOSABLE) ×6 IMPLANT
TRAY FOLEY CATH SILVER 14FR (SET/KITS/TRAYS/PACK) ×3 IMPLANT
WATER STERILE IRR 1000ML POUR (IV SOLUTION) IMPLANT

## 2015-01-29 NOTE — Op Note (Signed)
OPERATIVE NOTE  Lindsay Gomez  DOB:    23-Aug-1970  MRN:    [id number]  CSN:    [id number]  Date of Surgery:  01/29/2015  Preoperative Diagnosis:  Fibroid uterus  Menorrhagia  History of anemia  Obesity  Hypertension  Prior cesarean delivery  Postoperative Diagnosis:  Same  Procedure:  Total abdominal hysterectomy  Bilateral salpingectomy  Surgeon:  Gildardo Cranker, M.D.  Assistant:  Earnstine Regal, PA-C  Anesthetic:  General  Disposition:  The patient presents with a history as mentioned above. She has been treated previously with birth control pills and Lysteda. She now was to proceed with definitive therapy. She understands the indications for her surgical procedure. She accepts the risk of, but not limited to, anesthetic complications, bleeding, infections, and possible damage to the surrounding organs.  Findings:  The uterus weighed 414 g. Multiple fibroids were noted. The fallopian tubes were normal. The ovaries and the bowel appeared normal. There were minimal adhesions between the bladder and the anterior uterus.  Procedure:  The patient was taken to the operating room where a general anesthetic was given. A timeout was performed confirming the appropriate procedure. The patient's abdomen was prepped with DuraPrep. Her perineum and vagina were prepped with Betadine. A Foley catheter was placed in the bladder. The patient was sterilely draped. The patient's lower abdomen was injected with 20 cc of half percent Marcaine with epinephrine. A low transverse incision was made in the lower abdomen and carried sharply through the subcutaneous tissue, the fascia, and the anterior peritoneum. The pelvis was explored with findings as mentioned above. An abdominal wall retractor was placed. The bowel was packed cephalad. The ureters were identified bilaterally, and we felt that we could safely proceed. The uterus was elevated into the operative field. The  upper pedicles were clamped. The round ligament was identified on the right. The round ligament was suture ligated and cut. The right adnexa was then isolated, doubly clamped, cut, free tied, and suture ligated. An identical procedure was carried out on the opposite side. The bladder flap was developed anteriorly. Alternating from left to right, the uterine arteries, parametrial tissues, paracervical tissues, uterosacral ligaments, and the vaginal angles were clamped, cut, sutured, and tied securely. The cervix was transected from the apex of the vagina. The uterus was removed from the operative field. The vaginal cuff was closed using figure-of-eight sutures. The pelvis was irrigated. Hemostasis was confirmed. The right fallopian tube was identified. The mesosalpinx was clamped and cut. The mesosalpinx was tied with 2 free ties. An identical procedure was carried out on the left. The pelvis was again irrigated. Hemostasis was confirmed. All instruments and packs were removed from the abdominal cavity. The anterior peritoneum and the abdominal musculature were reapproximated in the midline using a running suture of 3-0 Vicryl. The abdominal musculature and the fascia were irrigated. Hemostasis was adequate. The fascia was closed using two running sutures followed by several interrupted sutures. The subcutaneous layer was closed using interrupted sutures. The skin was reapproximated using a subcuticular suture of 3-0 Monocryl. 0 Vicryl is the suture material used except where otherwise mentioned.  Sponge, needle, and instrument counts were correct on 2 occasions. The estimated blood loss for the procedure was 150 cc. The patient tolerated her procedure well. She was awakened from her anesthetic without difficulty. She was transported to the recovery room in stable condition. She was noted to drain clear yellow urine. Pathology specimens included: Uterus and fallopian tubes.   Arnell Sieving  Kennis Carina, M.D.   01/29/2015

## 2015-01-29 NOTE — Anesthesia Postprocedure Evaluation (Signed)
Anesthesia Post Note  Patient: Lindsay Gomez  Procedure(s) Performed: Procedure(s): TOTAL ABDOMINAL HYSTERECTOMY  (N/A) BILATERAL SALPINGECTOMY (Bilateral)  Anesthesia type: General  Patient location: [facility]  Post pain: Pain level controlled  Post assessment: Post-op Vital signs reviewed  Last Vitals: BP 123/66 mmHg  Pulse 87  Temp(Src) 36.7 C (Oral)  Resp 20  Ht 5\' 2"  (1.575 m)  Wt 279 lb (126.554 kg)  BMI 51.02 kg/m2  SpO2 99%  Post vital signs: Reviewed  Level of consciousness: awake  Complications: No apparent anesthesia complications

## 2015-01-29 NOTE — Anesthesia Postprocedure Evaluation (Signed)
  Anesthesia Post-op Note  Patient: Lindsay Gomez  Procedure(s) Performed: Procedure(s): TOTAL ABDOMINAL HYSTERECTOMY  (N/A) BILATERAL SALPINGECTOMY (Bilateral)  Patient Location: PACU  Anesthesia Type:General  Level of Consciousness: awake, alert  and oriented  Airway and Oxygen Therapy: Patient Spontanous Breathing and Patient connected to nasal cannula oxygen  Post-op Pain: mild  Post-op Assessment: Post-op Vital signs reviewed, Patient's Cardiovascular Status Stable, Respiratory Function Stable, Patent Airway, No signs of Nausea or vomiting and Pain level controlled              Post-op Vital Signs: Reviewed and stable  Last Vitals:  Filed Vitals:   01/29/15 1045  BP: 138/68  Pulse: 69  Temp:   Resp: 17    Complications: No apparent anesthesia complications

## 2015-01-29 NOTE — Addendum Note (Signed)
Addendum  created 01/29/15 1729 by Flossie Dibble, CRNA   Modules edited: Notes Section   Notes Section:  File: [id number]

## 2015-01-29 NOTE — Progress Notes (Signed)
Lindsay Gomez is a53 y.o.  [id number]  Post Op Date # 0:  TAH/BS  Subjective: Patient is Doing well postoperatively. Patient has Pain is controlled with current analgesics. Medications being used: prescription NSAID's including Ketorolac and narcotic analgesics including hydromorphone (Dilaudid). The patient is ambulating in the halls and tolerating liquids. Foley with 200 cc of concentrated urine.   Objective: Vital signs in last 24 hours: Temp:  [97.9 F (36.6 C)-98.9 F (37.2 C)] 98.1 F (36.7 C) (11/15 1736) Pulse Rate:  [69-99] 87 (11/15 1736) Resp:  [16-26] 20 (11/15 1736) BP: (93-145)/(51-87) 93/51 mmHg (11/15 1736) SpO2:  [94 %-100 %] 100 % (11/15 1736) FiO2 (%):  [2 %] 2 % (11/15 1445) Weight:  [279 lb (126.554 kg)] 279 lb (126.554 kg) (11/15 1148)  Intake/Output from previous day:   Intake/Output this shift:   No results for input(s): WBC, HGB, HCT, PLT in the last 168 hours.   Recent Labs Lab 01/29/15 1410  CREATININE 0.72    EXAM: General: alert, cooperative and no distress Resp: clear to auscultation bilaterally Cardio: regular rate and rhythm, S1, S2 normal, no murmur, click, rub or gallop GI: Bowel Sounds present and dressing clean/dry and intact. Extremities: No calf tenderness;  just returned from walking so SCD not on.   Assessment: s/p Procedure(s): TOTAL ABDOMINAL HYSTERECTOMY  BILATERAL SALPINGECTOMY: stable and progressing well  Plan: Encourage ambulation Routine care  Encourage Liquids  LOS: 0 days    Solaris Kram, PA-C 01/29/2015 7:43 PM

## 2015-01-29 NOTE — Transfer of Care (Signed)
Immediate Anesthesia Transfer of Care Note  Patient: Lindsay Gomez  Procedure(s) Performed: Procedure(s): TOTAL ABDOMINAL HYSTERECTOMY  (N/A) BILATERAL SALPINGECTOMY (Bilateral)  Patient Location: PACU  Anesthesia Type:General  Level of Consciousness: awake, sedated and patient cooperative  Airway & Oxygen Therapy: Patient Spontanous Breathing and Patient connected to nasal cannula oxygen  Post-op Assessment: Report given to RN and Post -op Vital signs reviewed and stable  Post vital signs: Reviewed and stable  Last Vitals:  Filed Vitals:   01/29/15 0614  BP: 136/87  Pulse: 85  Temp: 37.1 C  Resp: 18    Complications: No apparent anesthesia complications

## 2015-01-29 NOTE — H&P (Signed)
The patient was interviewed and examined today.  The previously documented history and physical examination was reviewed. There are no changes. The operative procedure was reviewed. The risks and benefits were outlined again. The specific risks include, but are not limited to, anesthetic complications, bleeding, infections, and possible damage to the surrounding organs. The patient's questions were answered.  We are ready to proceed as outlined. The likelihood of the patient achieving the goals of this procedure is very likely.   CBC    Component Value Date/Time   WBC 5.7 01/22/2015 1055   RBC 5.49* 01/22/2015 1055   RBC 4.58 11/14/2014 1655   HGB 12.7 01/22/2015 1055   HCT 42.7 01/22/2015 1055   PLT 286 01/22/2015 1055   MCV 77.8* 01/22/2015 1055   MCH 23.1* 01/22/2015 1055   MCHC 29.7* 01/22/2015 1055   RDW 21.1* 01/22/2015 1055   LYMPHSABS 1.8 06/27/2014 1030   MONOABS 0.5 06/27/2014 1030   EOSABS 0.1 06/27/2014 1030   BASOSABS 0.0 06/27/2014 1030    BMP, SERUM OR PLASMA in REVIEW to [email or web address] STAFF (created 01/28/15 10:25 PM)     BASIC METABOLIC PANEL (123XX123, Final, 01/28/2015 12:01am, tied to order [id number])  view report header Print  Interpretation  Template         Internal Note    Note to Patient  Templates            Priority This task is urgent   Provider    Alarm Days  Will go into FOLLOWUP this many days after submission by ResultsCall   Patient Access to Results Risk of patient harm or legal violation-do not send to patient                                Report Result Ref. Range Units  Status Lab  SODIUM 137  135-146 mmol/L  Normal  Final    POTASSIUM 4.3  3.5-5.3 mmol/L  Normal  Final    CHLORIDE 98  98-110 mmol/L  Normal  Final    CO2 27  20-31 mmol/L  Normal  Final    GLUCOSE 147  65-99 mg/dL  High  Final    BUN 11  7-25 mg/dL  Normal  Final    CREATININE 0.75  0.50-1.10 mg/dL  Normal  Final    CALCIUM 9.6  8.6-10.2 mg/dL  Normal  Final      RESULT NOTE Performed at: [place], [id number]  [place], [place] [address]   BP 136/87 mmHg  Pulse 85  Temp(Src) 98.8 F (37.1 C)  Resp 18  SpO2 99%   Gildardo Cranker, M.D.

## 2015-01-29 NOTE — Anesthesia Procedure Notes (Signed)
Procedure Name: Intubation Date/Time: 01/29/2015 7:35 AM Performed by: Tobin Chad Pre-anesthesia Checklist: Patient identified, Patient being monitored, Emergency Drugs available, Timeout performed and Suction available Patient Re-evaluated:Patient Re-evaluated prior to inductionOxygen Delivery Method: Circle system utilized and Simple face mask Preoxygenation: Pre-oxygenation with 100% oxygen Intubation Type: IV induction and Inhalational induction Ventilation: Mask ventilation without difficulty and Oral airway inserted - appropriate to patient size Laryngoscope Size: Mac and 3 Grade View: Grade II Tube type: Oral Tube size: 7.0 mm Number of attempts: 1 Airway Equipment and Method: Stylet Placement Confirmation: ETT inserted through vocal cords under direct vision,  positive ETCO2 and breath sounds checked- equal and bilateral Secured at: 20 cm Tube secured with: Tape Dental Injury: Injury to lip  Difficulty Due To: Difficulty was unanticipated

## 2015-01-30 ENCOUNTER — Encounter (HOSPITAL_COMMUNITY): Payer: Self-pay | Admitting: Obstetrics and Gynecology

## 2015-01-30 LAB — CBC
HCT: 35.1 % — ABNORMAL LOW (ref 36.0–46.0)
Hemoglobin: 10.7 g/dL — ABNORMAL LOW (ref 12.0–15.0)
MCH: 23.5 pg — ABNORMAL LOW (ref 26.0–34.0)
MCHC: 30.5 g/dL (ref 30.0–36.0)
MCV: 77.1 fL — ABNORMAL LOW (ref 78.0–100.0)
Platelets: 258 10*3/uL (ref 150–400)
RBC: 4.55 MIL/uL (ref 3.87–5.11)
RDW: 21.6 % — ABNORMAL HIGH (ref 11.5–15.5)
WBC: 9.8 10*3/uL (ref 4.0–10.5)

## 2015-01-30 LAB — BASIC METABOLIC PANEL
Anion gap: 6 (ref 5–15)
BUN: 9 mg/dL (ref 6–20)
CO2: 31 mmol/L (ref 22–32)
Calcium: 8.5 mg/dL — ABNORMAL LOW (ref 8.9–10.3)
Chloride: 99 mmol/L — ABNORMAL LOW (ref 101–111)
Creatinine, Ser: 0.59 mg/dL (ref 0.44–1.00)
GFR calc Af Amer: >60 mL/min (ref >60)
GFR calc non Af Amer: >60 mL/min (ref >60)
Glucose, Bld: 127 mg/dL — ABNORMAL HIGH (ref 65–99)
Potassium: 3.9 mmol/L (ref 3.5–5.1)
Sodium: 136 mmol/L (ref 135–145)

## 2015-01-30 NOTE — Progress Notes (Signed)
Lindsay Gomez is a39 y.o.  [id number]  Post Op Date # 1:  TAH/BS  Subjective: Patient is Doing well postoperatively. Patient has Pain is controlled with current analgesics. Medications being used: prescription NSAID's including Ketorolac and narcotic analgesics including hydromorphone (Dilaudid). Tolerating liquids, ambulating but hasn't voided yet.   Objective: Vital signs in last 24 hours: Temp:  [97.9 F (36.6 C)-98.9 F (37.2 C)] 98.4 F (36.9 C) (11/16 0500) Pulse Rate:  [60-99] 75 (11/16 0500) Resp:  [11-26] 11 (11/16 0614) BP: (93-145)/(50-78) 125/62 mmHg (11/16 0500) SpO2:  [94 %-100 %] 100 % (11/16 0614) FiO2 (%):  [2 %] 2 % (11/15 1445) Weight:  [279 lb (126.554 kg)] 279 lb (126.554 kg) (11/15 1148)  Intake/Output from previous day: 11/15 0701 - 11/16 0700 In: 6826.6 [P.O.:1500; I.V.:5326.6] Out: 3225 [Urine:3075] Intake/Output this shift: Total I/O In: 3302.8 [P.O.:1020; I.V.:2282.8] Out: 2550 [Urine:2550]  Recent Labs Lab 01/30/15 0545  WBC 9.8  HGB 10.7*  HCT 35.1*  PLT 258     Recent Labs Lab 01/29/15 1410 01/30/15 0545  NA  --  136  K  --  3.9  CL  --  99*  CO2  --  31  BUN  --  9  CREATININE 0.72 0.59  CALCIUM  --  8.5*  GLUCOSE  --  127*    EXAM: General: alert, cooperative and no distress Resp: clear to auscultation bilaterally Cardio: regular rate and rhythm, S1, S2 normal, no murmur, click, rub or gallop GI: Bowel sounds present and active;  abdominal dresssings are clean/dry/intact. Extremities: SCD hose in place and functioning; no calf tenderness and negative Homan's   Assessment: s/p Procedure(s): TOTAL ABDOMINAL HYSTERECTOMY  BILATERAL SALPINGECTOMY: stable, progressing well and anemia  Plan: Advance diet Encourage ambulation Advance to PO medication Routine care.  LOS: 1 day    Lindsay Rusconi, PA-C 01/30/2015 6:54 AM

## 2015-01-30 NOTE — Progress Notes (Signed)
PROGRESS NOTE  I have reviewed the patient's vital signs, labs, and notes. I have examined the patient. I agree with the previous note from the physician assistant.  Gildardo Cranker, M.D. 01/30/2015

## 2015-01-31 MED ORDER — OXYCODONE-ACETAMINOPHEN 5-325 MG PO TABS: ORAL_TABLET | ORAL | Status: DC

## 2015-01-31 MED ORDER — ONDANSETRON HCL 4 MG PO TABS: 4.0000 mg | ORAL_TABLET | Freq: Three times a day (TID) | ORAL | Status: DC | PRN

## 2015-01-31 MED ORDER — IBUPROFEN 600 MG PO TABS: ORAL_TABLET | ORAL | Status: DC

## 2015-01-31 NOTE — Care Management Note (Signed)
Case Management Note  Patient Details  Name: Lindsay Gomez MRN: [id number] Date of Birth: Jul 13, 1970  Subjective/Objective:   44 year old female admitted 01/29/15 for TAH/BSO.                 Action/Plan:D/C when medically stable      Additional Comments:CM received consult for medication needs.  Pt has private insurance and is not eligible for MATCH/medication assistance.  CM met with pt in pt's hospital room and discussed with her $4 medications available at [facility], and [facility].  Pt says she will try one of these.  Pt states she will be able to get necessary prescriptions filled.  Ailene Royal RNC-MNN, BSN 01/31/2015, 10:43 AM

## 2015-01-31 NOTE — Progress Notes (Signed)
PROGRESS NOTE  I have reviewed the patient's vital signs, labs, and notes. I have examined the patient. I agree with the previous note from the physician assistant.  Gildardo Cranker, M.D. 01/31/2015

## 2015-01-31 NOTE — Discharge Summary (Signed)
Physician Discharge Summary  Patient ID: JYL REIFER MRN: [id number] DOB/AGE: Apr 10, 1970 44 y.o.  Admit date: 01/29/2015 Discharge date: 01/31/2015   Discharge Diagnoses: Menorrhagia and Uterine Fibroids Active Problems:   Menorrhagia   Operation: Total Abdominal Hysterectomy with Bilateral Salpingectomy   Discharged Condition: Good  Hospital Course: On the date of admission,  the patient underwent the aforementioned procedures and tolerated them well.  Post operative course was unremarkable and the patient resumed bowel and bladder function by post operative day #2 and was deemed ready for discharge home.  Discharge hemoglobin/hematocrit was 10.7/35.1.  Disposition: 01-Home or Self Care  Discharge Medications:    Medication List    STOP taking these medications        tranexamic acid 650 MG Tabs tablet  Commonly known as:  LYSTEDA      TAKE these medications        ALPRAZolam 0.25 MG tablet  Commonly known as:  XANAX  Take 1 tablet (0.25 mg total) by mouth 2 (two) times daily as needed for anxiety.     amLODipine 10 MG tablet  Commonly known as:  NORVASC  Take 1 tablet (10 mg total) by mouth daily.     Azilsartan Medoxomil 40 MG Tabs  Commonly known as:  EDARBI  Take 40 mg by mouth daily.     chlorthalidone 25 MG tablet  Commonly known as:  HYGROTON  Take 1 tablet (25 mg total) by mouth daily.     cloNIDine 0.1 MG tablet  Commonly known as:  CATAPRES  Take 1 tablet (0.1 mg total) by mouth 2 (two) times daily.     ergocalciferol 50000 UNITS capsule  Commonly known as:  VITAMIN D2  Take 50,000 Units by mouth 2 (two) times a week.     FERIVA 21/7 PO  Take 1 tablet by mouth daily.     ibuprofen 600 MG tablet  Commonly known as:  ADVIL,MOTRIN  1  po  pc every 6 hours for 5 days then as needed for pain     ondansetron 4 MG tablet  Commonly known as:  ZOFRAN  Take 1 tablet (4 mg total) by mouth every 8 (eight) hours as needed for nausea or vomiting.      oxyCODONE-acetaminophen 5-325 MG tablet  Commonly known as:  PERCOCET/ROXICET  1-2 every 4 hours as needed for severe pain     potassium chloride SA 20 MEQ tablet  Commonly known as:  K-DUR,KLOR-CON  Take 1 tablet (20 mEq total) by mouth 2 (two) times daily.          Follow-up: Dr. Eli Hose on March 06, 2015 at 2:30 p.m.   SignedEarnstine Regal, PA-C 01/31/2015, 8:02 AM

## 2015-01-31 NOTE — Discharge Instructions (Signed)
Call [facility] @ [phone] if:  You have a temperature greater than or equal to 100.4 degrees Farenheit orally You have pain that is not made better by the pain medication given and taken as directed You have excessive bleeding or problems urinating  Take Colace (Docusate Sodium/Stool Softener) 100 mg 2-3 times daily while taking narcotic pain medicine to avoid constipation or until bowel movements are regular. Take Ibuprofen 600 mg every 6 hours for 5 days  (take with food)  then as needed for pain  Take over the counter iron supplementation of your choice,  twice a day for the next 6 weeks.  You may drive after 2  weeks You may walk up steps  You may shower  You may resume a regular diet  Keep incisions clean and dry;  You may remove your dressing on February 06, 2015 Do not lift over 15 pounds for 6 weeks Avoid anything in vagina for 6 weeks (or until after your post-operative visit)

## 2015-01-31 NOTE — Progress Notes (Signed)
Patient discharged home with mother and daughter... Discharge instructions reviewed with patient and she verbalized understanding... Condition stable... No equipment... Ambulated to car with Santiago Bur, NT.

## 2015-02-04 ENCOUNTER — Encounter (HOSPITAL_COMMUNITY): Payer: Self-pay | Admitting: *Deleted

## 2015-02-04 ENCOUNTER — Inpatient Hospital Stay (HOSPITAL_COMMUNITY)
Admission: AD | Admit: 2015-02-04 | Discharge: 2015-02-04 | Disposition: A | Discharge status: Home / Self Care | Payer: BC Managed Care – PPO | Source: Ambulatory Visit | Attending: Obstetrics and Gynecology | Admitting: Obstetrics and Gynecology

## 2015-02-04 DIAGNOSIS — T8189XA Other complications of procedures, not elsewhere classified, initial encounter: Secondary | ICD-10-CM | POA: Diagnosis not present

## 2015-02-04 DIAGNOSIS — R300 Dysuria: Secondary | ICD-10-CM | POA: Diagnosis not present

## 2015-02-04 NOTE — MAU Note (Addendum)
abd hyst on 11/15.  Has honeycomb dressing on , noted it is green now and has an odor. Pt thought there was some blood there before she left. Some pain with urination.

## 2015-02-04 NOTE — MAU Note (Addendum)
PT  SAYS  SHE HAD ABD  HYST  ON 11-15.  WENT  HOME  ON 11-17.   ALL GOOD  UNTIL  YESTERDAY-  HAD  BAD  SMELL.   HER DAUGHTER   SAID  INCISION HAD GREEN  DRAINAGE.   DAUGHTER  SAW  DRAINAGE ON INCISION  Friday  ALSO.     TOKK 2  PERCOCET  AT 12 NOON   FOR  ABD  PAIN     ON ASSESSMENT   -  MESH  DSG  INTACT   WITH   BAD   ODOR

## 2015-02-04 NOTE — Discharge Instructions (Signed)
Scar Minimization You will have a scar anytime you have surgery and a cut is made in the skin or you have something removed from your skin (mole, skin cancer, cyst). Although scars are unavoidable following surgery, there are ways to minimize their appearance. It is important to follow all the instructions you receive from your caregiver about wound care. How your wound heals will influence the appearance of your scar. If you do not follow the wound care instructions as directed, complications such as infection may occur. Wound instructions include keeping the wound clean, moist, and not letting the wound form a scab. Some people form scars that are raised and lumpy (hypertrophic) or larger than the initial wound (keloidal). HOME CARE INSTRUCTIONS   Follow wound care instructions as directed.  Keep the wound clean by washing it with soap and water.  Keep the wound moist with provided antibiotic cream or petroleum jelly until completely healed. Moisten twice a day for about 2 weeks.  Get stitches (sutures) taken out at the scheduled time.  Avoid touching or manipulating your wound unless needed. Wash your hands thoroughly before and after touching your wound.  Follow all restrictions such as limits on exercise or work. This depends on where your scar is located.  Keep the scar protected from sunburn. Cover the scar with sunscreen/sunblock with SPF 30 or higher.  Gently massage the scar using a circular motion to help minimize the appearance of the scar. Do this only after the wound has closed and all the sutures have been removed.  For hypertrophic or keloidal scars, there are several ways to treat and minimize their appearance. Methods include compression therapy, intralesional corticosteroids, laser therapy, or surgery. These methods are performed by your caregiver. Remember that the scar may appear lighter or darker than your normal skin color. This difference in color should even out with  time. SEEK MEDICAL CARE IF:   You have a fever.  You develop signs of infection such as pain, redness, pus, and warmth.  You have questions or concerns.   This information is not intended to replace advice given to you by your health care provider. Make sure you discuss any questions you have with your health care provider.   Document Released: 08/20/2009 Document Revised: 05/25/2011 Document Reviewed: 10/03/2014 Elsevier Interactive Patient Education 2016 [place]. Abdominal Hysterectomy, Care After Refer to this sheet in the next few weeks. These instructions provide you with information on caring for yourself after your procedure. Your health care provider may also give you more specific instructions. Your treatment has been planned according to current medical practices, but problems sometimes occur. Call your health care provider if you have any problems or questions after your procedure.  WHAT TO EXPECT AFTER THE PROCEDURE After your procedure, it is typical to have the following:  Pain.  Feeling tired.  Poor appetite.  Less interest in sex. It takes 4-6 weeks to recover from this surgery.  HOME CARE INSTRUCTIONS   Take pain medicines only as directed by your health care provider. Do not take over-the-counter pain medicines without checking with your health care provider first.  Change your bandage as directed by your health care provider.  Return to your health care provider to have your sutures taken out.  Take showers instead of baths for 2-3 weeks. Ask your health care provider when it is safe to start showering.  Do not douche, use tampons, or have sexual intercourse for at least 6 weeks or until your health care  provider says you can.   Follow your health care provider's advice about exercise, lifting, driving, and general activities.  Get plenty of rest and sleep.   Do not lift anything heavier than a gallon of milk (about 10 lb [4.5 kg]) for the first  month after surgery.  You can resume your normal diet if your health care provider says it is okay.   Do not drink alcohol until your health care provider says you can.   If you are constipated, ask your health care provider if you can take a mild laxative.  Eating foods high in fiber may also help with constipation. Eat plenty of raw fruits and vegetables, whole grains, and beans.  Drink enough fluids to keep your urine clear or pale yellow.   Try to have someone at home with you for the first 1-2 weeks to help around the house.  Keep all follow-up appointments. SEEK MEDICAL CARE IF:   You have chills or fever.  You have swelling, redness, or pain in the area of your incision that is getting worse.   You have pus coming from the incision.   You notice a bad smell coming from the incision or bandage.   Your incision breaks open.   You feel dizzy or light-headed.   You have pain or bleeding when you urinate.   You have persistent diarrhea.   You have persistent nausea and vomiting.   You have abnormal vaginal discharge.   You have a rash.   You have any type of abnormal reaction or develop an allergy to your medicine.   Your pain medicine is not helping.  SEEK IMMEDIATE MEDICAL CARE IF:   You have a fever and your symptoms suddenly get worse.  You have severe abdominal pain.  You have chest pain.  You have shortness of breath.  You faint.  You have pain, swelling, or redness of your leg.  You have heavy vaginal bleeding with blood clots. MAKE SURE YOU:  Understand these instructions.  Will watch your condition.  Will get help right away if you are not doing well or get worse.   This information is not intended to replace advice given to you by your health care provider. Make sure you discuss any questions you have with your health care provider.   Document Released: 09/19/2004 Document Revised: 03/23/2014 Document Reviewed:  12/23/2012 Elsevier Interactive Patient Education [employer].

## 2015-02-04 NOTE — MAU Provider Note (Signed)
History    Lindsay Gomez is a 44 y.o. Female who presents, unannounced, with post-op complications.  Patient reports TAH with BS on Nov 15 and was discharged on Nov 17.  Patient states she started to have a "strange hurt" at the incision site today and started to note a malodor from the incisional site yesterday.  Patient also reports that a green discharge was noted upon inspection.  Patient also reports some discomfort with urination that started today.  Patient is currently managing pain with percocet and has had good results. Patient reports bowel movement earlier today, but denies passing flatus and N/V.  Patient reports taking various medications for BP and last dose was this morning and she is still due for one dosage tonight.    Patient Active Problem List   Diagnosis Date Noted  . Pre-op evaluation 01/09/2015  . Hyperlipidemia LDL goal < 100 01/06/2013  . Right lumbar radiculopathy 10/26/2012  . ANA positive 09/14/2012  . LVH (left ventricular hypertrophy) 12/08/2011  . Menorrhagia 09/24/2011  . Routine general medical examination at a health care facility 09/24/2011  . Carpal tunnel syndrome of right wrist 09/18/2010  . Hypokalemia 09/18/2010  . Morbid obesity ([place]) 11/29/2008  . Iron deficiency anemia 11/28/2008  . Essential hypertension 11/28/2008  . ALLERGIC RHINITIS 11/28/2008    Chief Complaint  Patient presents with  . Post-op Problem   HPI  OB History    Gravida Para Term Preterm AB TAB SAB Ectopic Multiple Living   3 1   1  1   2       Past Medical History  Diagnosis Date  . Anemia   . Hypertension   . Allergy     rhinitis  . Morbid obesity ([place])   . Enlarged heart   . Shortness of breath dyspnea     with low iron  . Anxiety     Past Surgical History  Procedure Laterality Date  . Knee surgery  1992    ? arthroscopic/ right  . Cesarean section    . Abdominal hysterectomy N/A 01/29/2015    Procedure: TOTAL ABDOMINAL HYSTERECTOMY ;  Surgeon: Ena Dawley, MD;  Location: [facility];  Service: Gynecology;  Laterality: N/A;  . Bilateral salpingectomy Bilateral 01/29/2015    Procedure: BILATERAL SALPINGECTOMY;  Surgeon: Ena Dawley, MD;  Location: [facility];  Service: Gynecology;  Laterality: Bilateral;    Family History  Problem Relation Age of Onset  . Hypertension Mother   . Coronary artery disease Father   . Heart failure Father   . Heart attack Father   . Hypertension Other   . Hyperlipidemia Other     Social History  Substance Use Topics  . Smoking status: Never Smoker   . Smokeless tobacco: Never Used  . Alcohol Use: No    Allergies:  Allergies  Allergen Reactions  . Lisinopril Other (See Comments)    Cough     Prescriptions prior to admission  Medication Sig Dispense Refill Last Dose  . amLODipine (NORVASC) 10 MG tablet Take 1 tablet (10 mg total) by mouth daily. 30 tablet 1 02/04/2015 at Unknown time  . Azilsartan Medoxomil (EDARBI) 40 MG TABS Take 40 mg by mouth daily. 30 tablet 0 02/03/2015 at Unknown time  . chlorthalidone (HYGROTON) 25 MG tablet Take 1 tablet (25 mg total) by mouth daily. 90 tablet 0 02/03/2015 at Unknown time  . cloNIDine (CATAPRES) 0.1 MG tablet Take 1 tablet (0.1 mg total) by mouth 2 (two) times daily.  90 tablet 1 02/04/2015 at Unknown time  . IRON PO Take 1 tablet by mouth daily.   Past Week at Unknown time  . oxyCODONE-acetaminophen (PERCOCET/ROXICET) 5-325 MG tablet 1-2 every 4 hours as needed for severe pain (Patient taking differently: Take 1-2 tablets by mouth every 4 (four) hours as needed for severe pain. 1-2 every 4 hours as needed for severe pain) 40 tablet 0 02/04/2015 at Unknown time  . potassium chloride SA (K-DUR,KLOR-CON) 20 MEQ tablet Take 1 tablet (20 mEq total) by mouth 2 (two) times daily. 180 tablet 1 02/03/2015 at Unknown time  . ALPRAZolam (XANAX) 0.25 MG tablet Take 1 tablet (0.25 mg total) by mouth 2 (two) times daily as needed for anxiety. (Patient not taking: Reported  on 02/04/2015) 30 tablet 0 Not Taking at Unknown time  . ibuprofen (ADVIL,MOTRIN) 600 MG tablet 1  po  pc every 6 hours for 5 days then as needed for pain (Patient not taking: Reported on 02/04/2015) 30 tablet 1 Not Taking at Unknown time  . ondansetron (ZOFRAN) 4 MG tablet Take 1 tablet (4 mg total) by mouth every 8 (eight) hours as needed for nausea or vomiting. (Patient not taking: Reported on 02/04/2015) 20 tablet 0 Not Taking at Unknown time    ROS  See HPI Above Physical Exam   Blood pressure 158/95, pulse 94, temperature 99 F (37.2 C), temperature source Oral, resp. rate 20, last menstrual period 12/18/2014.  No results found for this or any previous visit (from the past 24 hour(s)).  Physical Exam  Constitutional: She is oriented to person, place, and time.  Obese  HENT:  Head: Normocephalic and atraumatic.  Eyes: EOM are normal.  Neck: Normal range of motion.  Cardiovascular: Normal rate.   Respiratory: Effort normal.  GI: Soft. She exhibits no distension. There is no tenderness.  Honeycomb dressing in place.  Saturated with old blood that appears black.    Musculoskeletal: Normal range of motion.  Neurological: She is alert and oriented to person, place, and time.  Skin:  Abdominal Incision: Intact, No active leaking or drainage.  Non-tender, warm, moist (due to wet honeycomb dressing).  Incisional site is unremarkable; no redness, ecchhymosis, erythema, and well approximated.       ED Course  Assessment: 44y.o. Female S/P TAH and BS Dysuria  Incisional Drainage  Plan: -Honeycomb dressing removed.  Incision cleaned with betadine solution and 4x4 gauze x 3.  Wiped dry with gauze, sanitized with alcohol wipe x 3. Steri strips applied along incisional site.   -Patient tolerated procedure well -Odor subsided with removal of honeycomb and cleaning of site -Instructed to take steri strips off in 7 days -Instructed to leave urine sample for processing; patient declines  stating too difficult to get sterile sample with panus and incisional pain -Instructed to contact office if symptoms worsen: verbalizes understanding -No questions or concerns -Keep appt as scheduled: 03/06/2015 -Informed that Dr. AVS would be contacted regarding patient evaluation -Encouraged to call if any questions or concerns arise prior to next scheduled office visit.  -Discharged to home in improved condition  [place], MSN 02/04/2015 8:32 PM

## 2015-04-24 ENCOUNTER — Encounter: Payer: Self-pay | Admitting: Family

## 2015-04-24 ENCOUNTER — Ambulatory Visit (INDEPENDENT_AMBULATORY_CARE_PROVIDER_SITE_OTHER): Payer: BC Managed Care – PPO | Admitting: Family

## 2015-04-24 VITALS — BP 150/98 | HR 102 | Temp 98.3°F | Resp 16 | Ht 62.0 in | Wt 288.0 lb

## 2015-04-24 DIAGNOSIS — F32A Depression, unspecified: Secondary | ICD-10-CM

## 2015-04-24 DIAGNOSIS — F419 Anxiety disorder, unspecified: Secondary | ICD-10-CM | POA: Insufficient documentation

## 2015-04-24 DIAGNOSIS — I10 Essential (primary) hypertension: Secondary | ICD-10-CM

## 2015-04-24 DIAGNOSIS — F418 Other specified anxiety disorders: Secondary | ICD-10-CM | POA: Diagnosis not present

## 2015-04-24 DIAGNOSIS — D509 Iron deficiency anemia, unspecified: Secondary | ICD-10-CM | POA: Diagnosis not present

## 2015-04-24 DIAGNOSIS — F329 Major depressive disorder, single episode, unspecified: Secondary | ICD-10-CM

## 2015-04-24 MED ORDER — FERIVA 21/7 75-1 MG PO TABS: 1.0000 | ORAL_TABLET | Freq: Every day | ORAL | Status: DC

## 2015-04-24 MED ORDER — AZILSARTAN-CHLORTHALIDONE 40-25 MG PO TABS: 1.0000 | ORAL_TABLET | Freq: Every day | ORAL | Status: DC

## 2015-04-24 NOTE — Progress Notes (Signed)
Subjective:    Patient ID: Lindsay Gomez, female    DOB: [birth year], 45 y.o.   MRN: [id number]  Chief Complaint  Patient presents with  . Hypertension    Follow up on BP    HPI:  Lindsay Gomez is a 45 y.o. female who  has a past medical history of Anemia; Hypertension; Allergy; Morbid obesity ([place]); Enlarged heart; Shortness of breath dyspnea; and Anxiety. and presents today for a follow up office visit.   1.) Hypertension - Currently taking clonidine, amlodipine, and chlorthalidone as prescribed. She has been out of the azilsartan. Does not currently take her blood pressure at home and does note the side effect of lower extremity edema. No hypotensive readings.   BP Readings from Last 3 Encounters:  04/24/15 150/98  02/04/15 168/87  01/31/15 149/78   2.) Anxiety/Depression - This is a new problem. Associated symptoms of tearfulness and increased anxiety have been worsening over the past several months with a severity that is enough to effect her day-to-day function. Denies suicidal ideations. Has never been on medication in the past.   3.) Iron deficiency anemia - Currently stable and maintained on Feriva. Takes the medication as prescribed and denies adverse side effects. Notes that her anemia symptoms are very well maintained on current medication.    Allergies  Allergen Reactions  . Lisinopril Other (See Comments)    Cough      Current Outpatient Prescriptions on File Prior to Visit  Medication Sig Dispense Refill  . ALPRAZolam (XANAX) 0.25 MG tablet Take 1 tablet (0.25 mg total) by mouth 2 (two) times daily as needed for anxiety. 30 tablet 0  . cloNIDine (CATAPRES) 0.1 MG tablet Take 1 tablet (0.1 mg total) by mouth 2 (two) times daily. 90 tablet 1  . ibuprofen (ADVIL,MOTRIN) 600 MG tablet 1  po  pc every 6 hours for 5 days then as needed for pain 30 tablet 1  . potassium chloride SA (K-DUR,KLOR-CON) 20 MEQ tablet Take 1 tablet (20 mEq total) by mouth 2 (two)  times daily. 180 tablet 1   No current facility-administered medications on file prior to visit.     Past Medical History  Diagnosis Date  . Anemia   . Hypertension   . Allergy     rhinitis  . Morbid obesity ([place])   . Enlarged heart   . Shortness of breath dyspnea     with low iron  . Anxiety     Review of Systems  Eyes:       Negative for changes in vision.  Respiratory: Negative for chest tightness.   Cardiovascular: Negative for chest pain, palpitations and leg swelling.  Neurological: Negative for dizziness and headaches.  Psychiatric/Behavioral: Positive for dysphoric mood. Negative for suicidal ideas. The patient is nervous/anxious.       Objective:    BP 150/98 mmHg  Pulse 102  Temp(Src) 98.3 F (36.8 C) (Oral)  Resp 16  Ht 5\' 2"  (1.575 m)  Wt 288 lb (130.636 kg)  BMI 52.66 kg/m2  SpO2 98%  LMP 12/18/2014 (Exact Date) Nursing note and vital signs reviewed.  Physical Exam  Constitutional: She is oriented to person, place, and time. She appears well-developed and well-nourished. No distress.  Cardiovascular: Normal rate, regular rhythm, normal heart sounds and intact distal pulses.   Pulmonary/Chest: Effort normal and breath sounds normal.  Neurological: She is alert and oriented to person, place, and time.  Skin: Skin is warm and dry.  Psychiatric: She  has a normal mood and affect. Her behavior is normal. Judgment and thought content normal.       Assessment & Plan:   Problem List Items Addressed This Visit      Cardiovascular and Mediastinum   Essential hypertension    Hypertension remains uncontrolled with current regimen and above goal of 140/90 with current regimen,.Discontinue amlodipine secondary to increased lower extremity edema. Change chlorthalidone and Azilsartan to combination medication. Continue current dosage of clonidine. Encouraged to monitor blood pressure at home. Follow up in 1 month or sooner if needed.       Relevant Medications    Azilsartan-Chlorthalidone (EDARBYCLOR) 40-25 MG TABS     Other   Iron deficiency anemia    Stable with current dosage of Feriva. Previous failed therapies with OTC iron formulations and Feralat. Recommend continued use of Feriva. Follow up for blood work to determine current status.       Relevant Medications   FeAsp-B12-FA-C-DSS-SuccAc-Zn (FERIVA 21/7) 75-1 MG TABS   Anxiety and depression - Primary    Symptoms and exam consistent with anxiety and depression secondary to multifactorial stressors. Discussed treatment options with stress relief techniques, medications and cognitive behavorial therapy. Wishes to pursue therapy at this time and will consider medication in the future. Encouraged to use Employee Assistance Program while awaiting referral. No suicidal ideation. Follow up if symptoms worsen or fail to improve prior to therapy.       Relevant Orders   Ambulatory referral to Psychology

## 2015-04-24 NOTE — Assessment & Plan Note (Signed)
Hypertension remains uncontrolled with current regimen and above goal of 140/90 with current regimen,.Discontinue amlodipine secondary to increased lower extremity edema. Change chlorthalidone and Azilsartan to combination medication. Continue current dosage of clonidine. Encouraged to monitor blood pressure at home. Follow up in 1 month or sooner if needed.

## 2015-04-24 NOTE — Assessment & Plan Note (Signed)
Symptoms and exam consistent with anxiety and depression secondary to multifactorial stressors. Discussed treatment options with stress relief techniques, medications and cognitive behavorial therapy. Wishes to pursue therapy at this time and will consider medication in the future. Encouraged to use Employee Assistance Program while awaiting referral. No suicidal ideation. Follow up if symptoms worsen or fail to improve prior to therapy.

## 2015-04-24 NOTE — Assessment & Plan Note (Signed)
Stable with current dosage of Feriva. Previous failed therapies with OTC iron formulations and Feralat. Recommend continued use of Feriva. Follow up for blood work to determine current status.

## 2015-04-24 NOTE — Progress Notes (Signed)
Pre visit review using our clinic review tool, if applicable. No additional management support is needed unless otherwise documented below in the visit note. 

## 2015-04-24 NOTE — Patient Instructions (Signed)
Thank you for choosing [facility].  Summary/Instructions:  Your prescription(s) have been submitted to your pharmacy or been printed and provided for you. Please take as directed and contact our office if you believe you are having problem(s) with the medication(s) or have any questions.  If your symptoms worsen or fail to improve, please contact our office for further instruction, or in case of emergency go directly to the emergency room at the closest medical facility.   Please stop taking the amlodipine.   Please star the edarbichlor.   Please continue to monitor your blood pressure at home.

## 2015-05-07 ENCOUNTER — Telehealth: Payer: Self-pay | Admitting: Family

## 2015-05-07 NOTE — Telephone Encounter (Signed)
Pt called regarding PA on Azilsartan-Chlorthalidone (EDARBYCLOR) 40-25 MG TABS [id number] and FeAsp-B12-FA-C-DSS-SuccAc-Zn (FERIVA 21/7) 75-1 MG TABS [id number]  And is wondering why it has taking so long. Can you please give her a call

## 2015-05-09 NOTE — Telephone Encounter (Signed)
Edarbyclor approved via [facility]

## 2015-05-09 NOTE — Telephone Encounter (Signed)
PAs initiated via [facility]) Lindsay Gomez, Lyda Perone) [place]

## 2015-05-10 NOTE — Telephone Encounter (Signed)
PA Lyda Perone has been denied. Please advise on alternative medication, thanks!

## 2015-05-13 MED ORDER — FERRALET 90 90-1 MG PO TABS: 1.0000 | ORAL_TABLET | Freq: Every day | ORAL | Status: DC

## 2015-05-13 NOTE — Telephone Encounter (Signed)
Pt advised of denial and new medication via personal VM

## 2015-05-13 NOTE — Telephone Encounter (Signed)
New medication sent to pharmacy

## 2015-06-17 ENCOUNTER — Ambulatory Visit (INDEPENDENT_AMBULATORY_CARE_PROVIDER_SITE_OTHER): Payer: BC Managed Care – PPO | Admitting: Family

## 2015-06-17 ENCOUNTER — Other Ambulatory Visit (INDEPENDENT_AMBULATORY_CARE_PROVIDER_SITE_OTHER): Payer: BC Managed Care – PPO

## 2015-06-17 ENCOUNTER — Encounter: Payer: Self-pay | Admitting: Family

## 2015-06-17 ENCOUNTER — Other Ambulatory Visit: Payer: BC Managed Care – PPO

## 2015-06-17 VITALS — BP 160/120 | HR 91 | Temp 98.5°F | Resp 16 | Ht 62.0 in | Wt 291.0 lb

## 2015-06-17 DIAGNOSIS — D509 Iron deficiency anemia, unspecified: Secondary | ICD-10-CM

## 2015-06-17 DIAGNOSIS — R3589 Other polyuria: Secondary | ICD-10-CM | POA: Insufficient documentation

## 2015-06-17 DIAGNOSIS — R358 Other polyuria: Secondary | ICD-10-CM

## 2015-06-17 DIAGNOSIS — R0683 Snoring: Secondary | ICD-10-CM | POA: Diagnosis not present

## 2015-06-17 DIAGNOSIS — I10 Essential (primary) hypertension: Secondary | ICD-10-CM

## 2015-06-17 DIAGNOSIS — R350 Frequency of micturition: Secondary | ICD-10-CM | POA: Diagnosis not present

## 2015-06-17 DIAGNOSIS — G4733 Obstructive sleep apnea (adult) (pediatric): Secondary | ICD-10-CM | POA: Insufficient documentation

## 2015-06-17 DIAGNOSIS — E119 Type 2 diabetes mellitus without complications: Secondary | ICD-10-CM

## 2015-06-17 LAB — BASIC METABOLIC PANEL
BUN: 10 mg/dL (ref 6–23)
CO2: 30 mEq/L (ref 19–32)
Calcium: 9.8 mg/dL (ref 8.4–10.5)
Chloride: 95 mEq/L — ABNORMAL LOW (ref 96–112)
Creatinine, Ser: 0.78 mg/dL (ref 0.40–1.20)
GFR: 103.76 mL/min (ref >60.00)
Glucose, Bld: 407 mg/dL — ABNORMAL HIGH (ref 70–99)
Potassium: 4 mEq/L (ref 3.5–5.1)
Sodium: 133 mEq/L — ABNORMAL LOW (ref 135–145)

## 2015-06-17 LAB — POCT URINALYSIS DIPSTICK
Bilirubin, UA: NEGATIVE
Blood, UA: NEGATIVE
Ketones, UA: NEGATIVE
Leukocytes, UA: NEGATIVE
Nitrite, UA: NEGATIVE
Spec Grav, UA: >=1.03
Urobilinogen, UA: NEGATIVE
pH, UA: 6

## 2015-06-17 LAB — HEMOGLOBIN A1C: Hgb A1c MFr Bld: 11.9 % — ABNORMAL HIGH (ref 4.6–6.5)

## 2015-06-17 LAB — IBC PANEL
Iron: 38 ug/dL — ABNORMAL LOW (ref 42–145)
Saturation Ratios: 8.8 % — ABNORMAL LOW (ref 20.0–50.0)
Transferrin: 310 mg/dL (ref 212.0–360.0)

## 2015-06-17 LAB — TSH: TSH: 1.11 u[IU]/mL (ref 0.35–4.50)

## 2015-06-17 LAB — FERRITIN: Ferritin: 60.3 ng/mL (ref 10.0–291.0)

## 2015-06-17 MED ORDER — FLUCONAZOLE 150 MG PO TABS: 150.0000 mg | ORAL_TABLET | Freq: Once | ORAL | Status: DC

## 2015-06-17 MED ORDER — FERIVA 21/7 75-1 MG PO TABS: 1.0000 | ORAL_TABLET | Freq: Every day | ORAL | Status: DC

## 2015-06-17 MED ORDER — METFORMIN HCL ER (MOD) 500 MG PO TB24: 500.0000 mg | ORAL_TABLET | Freq: Every day | ORAL | Status: DC

## 2015-06-17 NOTE — Assessment & Plan Note (Signed)
In office urinalysis negative and sent for culture. Obtain A1c and TSH as symptoms are consistent with type 2 diabetes. Start Diflucan as needed for candidiasis. Follow-up pending blood work and urine results.

## 2015-06-17 NOTE — Assessment & Plan Note (Addendum)
Blood pressure is elevated today above goal 140/90 secondary to patient not taking medications with believe they were causing her excessive urination and possible nausea. Encouraged to restart taking her medications as prescribed. Continue current dosage of Azilsartan-chlorthalidone. Encouraged to monitor blood pressure at home. Follow-up pending lab work results.

## 2015-06-17 NOTE — Patient Instructions (Signed)
Thank you for choosing [facility].  Summary/Instructions:  Please continue to take your medication as prescribed.   Please stop by the lab on the basement level of the building for your blood work. Your results will be released to [place] (or called to you) after review, usually within 72 hours after test completion. If any changes need to be made, you will be notified at that same time.  If your symptoms worsen or fail to improve, please contact our office for further instruction, or in case of emergency go directly to the emergency room at the closest medical facility.   Sleep Apnea  Sleep apnea is a sleep disorder characterized by abnormal pauses in breathing while you sleep. When your breathing pauses, the level of oxygen in your blood decreases. This causes you to move out of deep sleep and into light sleep. As a result, your quality of sleep is poor, and the system that carries your blood throughout your body (cardiovascular system) experiences stress. If sleep apnea remains untreated, the following conditions can develop:  High blood pressure (hypertension).  Coronary artery disease.  Inability to achieve or maintain an erection (impotence).  Impairment of your thought process (cognitive dysfunction). There are three types of sleep apnea: 1. Obstructive sleep apnea--Pauses in breathing during sleep because of a blocked airway. 2. Central sleep apnea--Pauses in breathing during sleep because the area of the brain that controls your breathing does not send the correct signals to the muscles that control breathing. 3. Mixed sleep apnea--A combination of both obstructive and central sleep apnea. RISK FACTORS The following risk factors can increase your risk of developing sleep apnea:  Being overweight.  Smoking.  Having narrow passages in your nose and throat.  Being of older age.  Being female.  Alcohol use.  Sedative and tranquilizer use.  Ethnicity. Among  individuals younger than 35 years, African Americans are at increased risk of sleep apnea. SYMPTOMS   Difficulty staying asleep.  Daytime sleepiness and fatigue.  Loss of energy.  Irritability.  Loud, heavy snoring.  Morning headaches.  Trouble concentrating.  Forgetfulness.  Decreased interest in sex.  Unexplained sleepiness. DIAGNOSIS  In order to diagnose sleep apnea, your caregiver will perform a physical examination. A sleep study done in the comfort of your own home may be appropriate if you are otherwise healthy. Your caregiver may also recommend that you spend the night in a sleep lab. In the sleep lab, several monitors record information about your heart, lungs, and brain while you sleep. Your leg and arm movements and blood oxygen level are also recorded. TREATMENT The following actions may help to resolve mild sleep apnea:  Sleeping on your side.   Using a decongestant if you have nasal congestion.   Avoiding the use of depressants, including alcohol, sedatives, and narcotics.   Losing weight and modifying your diet if you are overweight. There also are devices and treatments to help open your airway:  Oral appliances. These are custom-made mouthpieces that shift your lower jaw forward and slightly open your bite. This opens your airway.  Devices that create positive airway pressure. This positive pressure "splints" your airway open to help you breathe better during sleep. The following devices create positive airway pressure:  Continuous positive airway pressure (CPAP) device. The CPAP device creates a continuous level of air pressure with an air pump. The air is delivered to your airway through a mask while you sleep. This continuous pressure keeps your airway open.  Nasal expiratory positive  airway pressure (EPAP) device. The EPAP device creates positive air pressure as you exhale. The device consists of single-use valves, which are inserted into each  nostril and held in place by adhesive. The valves create very little resistance when you inhale but create much more resistance when you exhale. That increased resistance creates the positive airway pressure. This positive pressure while you exhale keeps your airway open, making it easier to breath when you inhale again.  Bilevel positive airway pressure (BPAP) device. The BPAP device is used mainly in patients with central sleep apnea. This device is similar to the CPAP device because it also uses an air pump to deliver continuous air pressure through a mask. However, with the BPAP machine, the pressure is set at two different levels. The pressure when you exhale is lower than the pressure when you inhale.  Surgery. Typically, surgery is only done if you cannot comply with less invasive treatments or if the less invasive treatments do not improve your condition. Surgery involves removing excess tissue in your airway to create a wider passage way.   This information is not intended to replace advice given to you by your health care provider. Make sure you discuss any questions you have with your health care provider.   Document Released: 02/20/2002 Document Revised: 03/23/2014 Document Reviewed: 07/09/2011 Elsevier Interactive Patient Education [employer].

## 2015-06-17 NOTE — Progress Notes (Signed)
Pre visit review using our clinic review tool, if applicable. No additional management support is needed unless otherwise documented below in the visit note. 

## 2015-06-17 NOTE — Assessment & Plan Note (Signed)
Previously noted to have snoring while sleeping and daytime fatigue and sleepiness. Given risk factors including hypertension, obesity, and possible diabetes refer to pulmonology for nocturnal polysomnograph to rule out obstructive sleep apnea.

## 2015-06-17 NOTE — Progress Notes (Signed)
Subjective:    Patient ID: Lindsay Gomez, female    DOB: 19-Oct-1970, 45 y.o.   MRN: [id number]  Chief Complaint  Patient presents with  . Diabetes checked    noticed over the last week that she has had excessive urination and thirst, vaginal itching     HPI:  Lindsay Gomez is a 45 y.o. female who  has a past medical history of Anemia; Hypertension; Allergy; Morbid obesity ([place]); Enlarged heart; Shortness of breath dyspnea; and Anxiety. and presents today For a follow-up office visit.  1.) Increased thirst - This is a new problem. Associated symptom of increased urination and thirst coupled with fatigue have been going on for the last couple weeks. She is also experiencing associated symptom of vaginal itching. Denies any changes in vision. Does have occasional numbness and tingling in her feet which lasts 2-3 minutes at a time. No chest pain, shortness of breath or palpitations. She does snore when sleeping.    Wt Readings from Last 3 Encounters:  06/17/15 291 lb (131.997 kg)  04/24/15 288 lb (130.636 kg)  01/29/15 279 lb (126.554 kg)    2.) Iron deficiency anemia - Currently taking Ferralat as prescribed and has noted the associated side effects of continuous nausea. Since she stopped taking the medication she has felt improvement. She has previously had success with Lyda Perone but was switched as it was not covered by her insurance. She has also been intolerant to ferrous sulfate and ferrous fumarate.   Lab Results  Component Value Date   WBC 9.8 01/30/2015   HGB 10.7* 01/30/2015   HCT 35.1* 01/30/2015   MCV 77.1* 01/30/2015   PLT 258 01/30/2015   Allergies  Allergen Reactions  . Lisinopril Other (See Comments)    Cough      Current Outpatient Prescriptions on File Prior to Visit  Medication Sig Dispense Refill  . Azilsartan-Chlorthalidone (EDARBYCLOR) 40-25 MG TABS Take 1 tablet by mouth daily. 30 tablet 2  . Fe Cbn-Fe Gluc-FA-B12-C-DSS (FERRALET 90) 90-1 MG TABS  Take 1 tablet by mouth daily. 90 each 3  . ibuprofen (ADVIL,MOTRIN) 600 MG tablet 1  po  pc every 6 hours for 5 days then as needed for pain 30 tablet 1  . potassium chloride SA (K-DUR,KLOR-CON) 20 MEQ tablet Take 1 tablet (20 mEq total) by mouth 2 (two) times daily. 180 tablet 1   No current facility-administered medications on file prior to visit.     Past Surgical History  Procedure Laterality Date  . Knee surgery  1992    ? arthroscopic/ right  . Cesarean section    . Abdominal hysterectomy N/A 01/29/2015    Procedure: TOTAL ABDOMINAL HYSTERECTOMY ;  Surgeon: Ena Dawley, MD;  Location: [facility];  Service: Gynecology;  Laterality: N/A;  . Bilateral salpingectomy Bilateral 01/29/2015    Procedure: BILATERAL SALPINGECTOMY;  Surgeon: Ena Dawley, MD;  Location: [facility];  Service: Gynecology;  Laterality: Bilateral;     Review of Systems  Constitutional: Positive for fatigue. Negative for fever and chills.  Eyes:       Denies changes in vision  Respiratory: Negative for chest tightness and shortness of breath.   Cardiovascular: Negative for chest pain, palpitations and leg swelling.  Endocrine: Positive for polydipsia, polyphagia and polyuria.  Neurological: Negative for numbness and headaches.      Objective:    BP 160/120 mmHg  Pulse 91  Temp(Src) 98.5 F (36.9 C) (Oral)  Resp 16  Ht 5\' 2"  (1.575  m)  Wt 291 lb (131.997 kg)  BMI 53.21 kg/m2  SpO2 98%  LMP 12/18/2014 (Exact Date) Nursing note and vital signs reviewed.  Physical Exam  Constitutional: She is oriented to person, place, and time. She appears well-developed and well-nourished. No distress.  Cardiovascular: Normal rate, regular rhythm, normal heart sounds and intact distal pulses.   Pulmonary/Chest: Effort normal and breath sounds normal.  Neurological: She is alert and oriented to person, place, and time.  Skin: Skin is warm and dry.  Psychiatric: She has a normal mood and affect. Her behavior is  normal. Judgment and thought content normal.       Assessment & Plan:   Problem List Items Addressed This Visit      Cardiovascular and Mediastinum   Essential hypertension    Blood pressure is elevated today above goal 140/90 secondary to patient not taking medications with believe they were causing her excessive urination and possible nausea. Encouraged to restart taking her medications as prescribed. Continue current dosage of Azilsartan-chlorthalidone. Encouraged to monitor blood pressure at home. Follow-up pending lab work results.        Genitourinary   Polyuria - Primary    In office urinalysis negative and sent for culture. Obtain A1c and TSH as symptoms are consistent with type 2 diabetes. Start Diflucan as needed for candidiasis. Follow-up pending blood work and urine results.      Relevant Orders   POCT urinalysis dipstick (Completed)   Hemoglobin A1c (Completed)   TSH (Completed)   Basic Metabolic Panel (BMET) (Completed)   Urine culture     Other   Iron deficiency anemia    Obtain IBC panel, CBC, and ferritin level. Experiencing nausea associated with medication which is relieved when not taking medication. Discontinue Ferralet. Restart Lyda Perone. Has had multiple failures of medications including ferrous sulfate, ferrous fumarate, and Ferralet. It may be medically necessary for her to have the [facility] as she has been successful on the medication in the past or may require referral to hematology for infusions.       Relevant Medications   FeAsp-B12-FA-C-DSS-SuccAc-Zn (FERIVA 21/7) 75-1 MG TABS   Other Relevant Orders   Ferritin (Completed)   IBC panel (Completed)   Snoring    Previously noted to have snoring while sleeping and daytime fatigue and sleepiness. Given risk factors including hypertension, obesity, and possible diabetes refer to pulmonology for nocturnal polysomnograph to rule out obstructive sleep apnea.      Relevant Orders   Nocturnal polysomnography  (NPSG)       I have discontinued Ms. Timbrook's cloNIDine and ALPRAZolam. I am also having her start on fluconazole. Additionally, I am having her maintain her potassium chloride SA, ibuprofen, Azilsartan-Chlorthalidone, FERRALET 90, and FERIVA 21/7.   Meds ordered this encounter  Medications  . FeAsp-B12-FA-C-DSS-SuccAc-Zn (FERIVA 21/7) 75-1 MG TABS    Sig: Take 1 tablet by mouth daily.    Dispense:  28 tablet    Refill:  0    Order Specific Question:  Supervising Provider    Answer:  Pricilla Holm A [id number]  . fluconazole (DIFLUCAN) 150 MG tablet    Sig: Take 1 tablet (150 mg total) by mouth once.    Dispense:  1 tablet    Refill:  0    Order Specific Question:  Supervising Provider    Answer:  Pricilla Holm A [id number]     Follow-up: Return for Pending blood work.  Mauricio Po, FNP

## 2015-06-17 NOTE — Assessment & Plan Note (Signed)
Obtain IBC panel, CBC, and ferritin level. Experiencing nausea associated with medication which is relieved when not taking medication. Discontinue Ferralet. Restart Lindsay Gomez. Has had multiple failures of medications including ferrous sulfate, ferrous fumarate, and Ferralet. It may be medically necessary for her to have the [facility] as she has been successful on the medication in the past or may require referral to hematology for infusions.

## 2015-06-18 LAB — URINE CULTURE: Colony Count: 40000

## 2015-06-19 ENCOUNTER — Encounter: Payer: Self-pay | Admitting: Family

## 2015-06-24 ENCOUNTER — Encounter: Payer: Self-pay | Admitting: Family

## 2015-06-24 ENCOUNTER — Ambulatory Visit (INDEPENDENT_AMBULATORY_CARE_PROVIDER_SITE_OTHER): Payer: BC Managed Care – PPO | Admitting: Family

## 2015-06-24 VITALS — BP 144/98 | HR 96 | Temp 98.5°F | Resp 16 | Ht 62.0 in | Wt 286.0 lb

## 2015-06-24 DIAGNOSIS — F32A Depression, unspecified: Secondary | ICD-10-CM | POA: Insufficient documentation

## 2015-06-24 DIAGNOSIS — F329 Major depressive disorder, single episode, unspecified: Secondary | ICD-10-CM | POA: Diagnosis not present

## 2015-06-24 DIAGNOSIS — E1169 Type 2 diabetes mellitus with other specified complication: Secondary | ICD-10-CM | POA: Insufficient documentation

## 2015-06-24 DIAGNOSIS — E119 Type 2 diabetes mellitus without complications: Secondary | ICD-10-CM | POA: Diagnosis not present

## 2015-06-24 DIAGNOSIS — E785 Hyperlipidemia, unspecified: Secondary | ICD-10-CM | POA: Insufficient documentation

## 2015-06-24 MED ORDER — GLUCOSE BLOOD VI STRP: ORAL_STRIP | Status: DC

## 2015-06-24 MED ORDER — ONETOUCH ULTRA MINI W/DEVICE KIT: PACK | Status: DC

## 2015-06-24 MED ORDER — BUPROPION HCL ER (XL) 150 MG PO TB24: 150.0000 mg | ORAL_TABLET | Freq: Every day | ORAL | Status: DC

## 2015-06-24 MED ORDER — DULAGLUTIDE 0.75 MG/0.5ML ~~LOC~~ SOAJ: 0.7500 mg | SUBCUTANEOUS | Status: DC

## 2015-06-24 NOTE — Progress Notes (Addendum)
Subjective:    Patient ID: Lindsay Gomez, female    DOB: Jun 28, 1970, 45 y.o.   MRN: [id number]  Chief Complaint  Patient presents with  . Follow-up    has questions on how to change her diet and keep sugars leveled out, has very low energy, very tired and upset    HPI:  [name] is a 45 y.o. female who  has a past medical history of Anemia; Hypertension; Allergy; Morbid obesity ([place]); Enlarged heart; Shortness of breath dyspnea; and Anxiety. and presents today for an office follow up.   Recently diagnosed with Type 2 diabetes with an A1c of 11.9 and started on metformin. Reports taking the medication as prescribed and denies adverse side effects. Also notes siginificant fatigue and every thing is taking "all of her energy". Severity is described as "murder to do things and it is a no win situation."  Her diabetic symptoms have improved slightly since starting metformin including decreased urination, hunger and thirst.   Allergies  Allergen Reactions  . Lisinopril Other (See Comments)    Cough      Current Outpatient Prescriptions on File Prior to Visit  Medication Sig Dispense Refill  . Azilsartan-Chlorthalidone (EDARBYCLOR) 40-25 MG TABS Take 1 tablet by mouth daily. 30 tablet 2  . fluconazole (DIFLUCAN) 150 MG tablet Take 1 tablet (150 mg total) by mouth once. 1 tablet 0  . metFORMIN (GLUMETZA) 500 MG (MOD) 24 hr tablet Take 1 tablet (500 mg total) by mouth daily with breakfast. 30 tablet 0  . potassium chloride SA (K-DUR,KLOR-CON) 20 MEQ tablet Take 1 tablet (20 mEq total) by mouth 2 (two) times daily. 180 tablet 1   No current facility-administered medications on file prior to visit.    Past Medical History  Diagnosis Date  . Anemia   . Hypertension   . Allergy     rhinitis  . Morbid obesity ([place])   . Enlarged heart   . Shortness of breath dyspnea     with low iron  . Anxiety      Past Surgical History  Procedure Laterality Date  . Knee surgery   1992    ? arthroscopic/ right  . Cesarean section    . Abdominal hysterectomy N/A 01/29/2015    Procedure: TOTAL ABDOMINAL HYSTERECTOMY ;  Surgeon: Ena Dawley, MD;  Location: [facility];  Service: Gynecology;  Laterality: N/A;  . Bilateral salpingectomy Bilateral 01/29/2015    Procedure: BILATERAL SALPINGECTOMY;  Surgeon: Ena Dawley, MD;  Location: [facility];  Service: Gynecology;  Laterality: Bilateral;    Review of Systems  Constitutional: Negative for fever and chills.  Respiratory: Negative for chest tightness and shortness of breath.   Cardiovascular: Negative for chest pain, palpitations and leg swelling.  Psychiatric/Behavioral: Positive for dysphoric mood. Negative for suicidal ideas and sleep disturbance.      Objective:    BP 144/98 mmHg  Pulse 96  Temp(Src) 98.5 F (36.9 C) (Oral)  Resp 16  Ht _0  (1.575 m)  Wt 286 lb (129.729 kg)  BMI 52.30 kg/m2  SpO2 97%  LMP 12/18/2014 (Exact Date) Nursing note and vital signs reviewed.  Physical Exam  Constitutional: She is oriented to person, place, and time. She appears well-developed and well-nourished. No distress.  Cardiovascular: Normal rate, regular rhythm, normal heart sounds and intact distal pulses.   Pulmonary/Chest: Effort normal and breath sounds normal.  Neurological: She is alert and oriented to person, place, and time.  Diabetic Foot Exam - Simple  Simple Foot Form  Diabetic Foot exam was performed with the following findings:  Yes  06/24/2015  3:15 PM  Visual Inspection  No deformities, no ulcerations, no other skin breakdown bilaterally:  Yes  Sensation Testing  Intact to touch and monofilament testing bilaterally:  Yes  Pulse Check  Posterior Tibialis and Dorsalis pulse intact bilaterally:  Yes   Skin: Skin is warm and dry.  Psychiatric: She has a normal mood and affect. Her behavior is normal. Judgment and thought content normal.       Assessment & Plan:   Problem List Items Addressed This  Visit      Endocrine   Type 2 diabetes mellitus ([place]) - Primary    Newly diagnosed with Type 2 diabetes and tolerating metformin with minimal side effects. Discussed diabetic prevention. Foot exam completed today. Maintained on azilsartan for CAD risk reduction. Encouraged to complete diabetic eye exam at her convenience. New glucose meter and supplies ordered. Continue current dosage of metformin. Start trial of Trulicity. Refer to diabetic education. Follow up in 2 weeks pending trail of Trulicity.       Relevant Medications   Dulaglutide (TRULICITY) 7.54 GB/2.0FE SOPN   Other Relevant Orders   Ambulatory referral to diabetic education     Other   Depression    This is a new problem. Symptoms and exam consistent with situational depression most likely related to newly diagnosed diabetes and work related stressors. Start Buproprion. No suicidal ideations. Follow up in 2 weeks.       Relevant Medications   buPROPion (WELLBUTRIN XL) 150 MG 24 hr tablet       I have discontinued Ms. Castrillon's ibuprofen, FERRALET 90, and FERIVA 21/7. I am also having her start on Dulaglutide, buPROPion, ONE TOUCH ULTRA MINI, and glucose blood. Additionally, I am having her maintain her potassium chloride SA, Azilsartan-Chlorthalidone, fluconazole, and metFORMIN.   Meds ordered this encounter  Medications  . Dulaglutide (TRULICITY) 0.71 QR/9.7JO SOPN    Sig: Inject 0.75 mg into the skin once a week.    Dispense:  2 pen    Refill:  0    Order Specific Question:  Supervising Provider    Answer:  Pricilla Holm A [[id number]]  . buPROPion (WELLBUTRIN XL) 150 MG 24 hr tablet    Sig: Take 1 tablet (150 mg total) by mouth daily.    Dispense:  30 tablet    Refill:  0    Order Specific Question:  Supervising Provider    Answer:  Pricilla Holm A [[id number]]  . Blood Glucose Monitoring Suppl (ONE TOUCH ULTRA MINI) w/Device KIT    Sig: Use device to test blood sugars 1-4 times daily as instructed.     Dispense:  1 each    Refill:  0    Substitution permissible per insurance coverage. Dx E11.9    Order Specific Question:  Supervising Provider    Answer:  Pricilla Holm A [[id number]]  . glucose blood test strip    Sig: Use as instructed    Dispense:  100 each    Refill:  12    Substitution permissible per insurance coverage Dx E11.9    Order Specific Question:  Supervising Provider    Answer:  Pricilla Holm A [[id number]]     Follow-up: Return in about 2 weeks (around 07/08/2015) for Diabetes.  Mauricio Po, FNP

## 2015-06-24 NOTE — Patient Instructions (Signed)
Thank you for choosing [facility].  Summary/Instructions:  Your prescription(s) have been submitted to your pharmacy or been printed and provided for you. Please take as directed and contact our office if you believe you are having problem(s) with the medication(s) or have any questions.  Please continue to take the medications as prescribed.  Glucose meter will be sent to pharmacy.   Follow up in 1 month.  They will call to schedule your nutrition consult.   If your symptoms worsen or fail to improve, please contact our office for further instruction, or in case of emergency go directly to the emergency room at the closest medical facility.

## 2015-06-24 NOTE — Assessment & Plan Note (Signed)
Newly diagnosed with Type 2 diabetes and tolerating metformin with minimal side effects. Discussed diabetic prevention. Foot exam completed today. Maintained on azilsartan for CAD risk reduction. Encouraged to complete diabetic eye exam at her convenience. New glucose meter and supplies ordered. Continue current dosage of metformin. Start trial of Trulicity. Refer to diabetic education. Follow up in 2 weeks pending trail of Trulicity.

## 2015-06-24 NOTE — Assessment & Plan Note (Addendum)
This is a new problem. Symptoms and exam consistent with situational depression most likely related to newly diagnosed diabetes and work related stressors. Start Buproprion. No suicidal ideations. Follow up in 2 weeks.

## 2015-06-24 NOTE — Progress Notes (Signed)
Pre visit review using our clinic review tool, if applicable. No additional management support is needed unless otherwise documented below in the visit note. 

## 2015-07-02 ENCOUNTER — Other Ambulatory Visit: Payer: Self-pay | Admitting: Family

## 2015-07-02 DIAGNOSIS — E119 Type 2 diabetes mellitus without complications: Secondary | ICD-10-CM

## 2015-07-02 MED ORDER — DULAGLUTIDE 0.75 MG/0.5ML ~~LOC~~ SOAJ: 0.7500 mg | SUBCUTANEOUS | Status: DC

## 2015-07-08 ENCOUNTER — Other Ambulatory Visit: Payer: Self-pay

## 2015-07-08 DIAGNOSIS — D509 Iron deficiency anemia, unspecified: Secondary | ICD-10-CM

## 2015-07-15 ENCOUNTER — Telehealth: Payer: Self-pay | Admitting: Hematology and Oncology

## 2015-07-15 ENCOUNTER — Encounter: Payer: Self-pay | Admitting: Hematology and Oncology

## 2015-07-15 NOTE — Telephone Encounter (Signed)
Verified address and insurance, in basket referring office appt date/time, mailed new pt packet, scheduled intake

## 2015-07-18 ENCOUNTER — Ambulatory Visit: Payer: BC Managed Care – PPO | Admitting: Dietician

## 2015-07-29 ENCOUNTER — Ambulatory Visit (HOSPITAL_BASED_OUTPATIENT_CLINIC_OR_DEPARTMENT_OTHER): Payer: BC Managed Care – PPO | Admitting: Hematology and Oncology

## 2015-07-29 ENCOUNTER — Telehealth: Payer: Self-pay | Admitting: Hematology and Oncology

## 2015-07-29 ENCOUNTER — Other Ambulatory Visit: Payer: Self-pay | Admitting: *Deleted

## 2015-07-29 ENCOUNTER — Encounter: Payer: Self-pay | Admitting: Hematology and Oncology

## 2015-07-29 ENCOUNTER — Ambulatory Visit (HOSPITAL_BASED_OUTPATIENT_CLINIC_OR_DEPARTMENT_OTHER): Payer: BC Managed Care – PPO

## 2015-07-29 VITALS — BP 157/104 | HR 96 | Temp 98.3°F | Resp 21 | Wt 288.1 lb

## 2015-07-29 DIAGNOSIS — R5383 Other fatigue: Secondary | ICD-10-CM

## 2015-07-29 DIAGNOSIS — D509 Iron deficiency anemia, unspecified: Secondary | ICD-10-CM | POA: Diagnosis not present

## 2015-07-29 DIAGNOSIS — E1165 Type 2 diabetes mellitus with hyperglycemia: Secondary | ICD-10-CM | POA: Diagnosis not present

## 2015-07-29 LAB — COMPREHENSIVE METABOLIC PANEL
ALT: 149 U/L — ABNORMAL HIGH (ref 0–55)
AST: 92 U/L — ABNORMAL HIGH (ref 5–34)
Albumin: 3.5 g/dL (ref 3.5–5.0)
Alkaline Phosphatase: 74 U/L (ref 40–150)
Anion Gap: 9 mEq/L (ref 3–11)
BUN: 11.5 mg/dL (ref 7.0–26.0)
CO2: 31 mEq/L — ABNORMAL HIGH (ref 22–29)
Calcium: 9.7 mg/dL (ref 8.4–10.4)
Chloride: 97 mEq/L — ABNORMAL LOW (ref 98–109)
Creatinine: 1.1 mg/dL (ref 0.6–1.1)
EGFR: 70 mL/min/{1.73_m2} — ABNORMAL LOW (ref >90)
Glucose: 315 mg/dl — ABNORMAL HIGH (ref 70–140)
Potassium: 3.3 mEq/L — ABNORMAL LOW (ref 3.5–5.1)
Sodium: 137 mEq/L (ref 136–145)
Total Bilirubin: 0.38 mg/dL (ref 0.20–1.20)
Total Protein: 7.5 g/dL (ref 6.4–8.3)

## 2015-07-29 LAB — CBC & DIFF AND RETIC
BASO%: 0.1 % (ref 0.0–2.0)
Basophils Absolute: 0 10*3/uL (ref 0.0–0.1)
EOS%: 1 % (ref 0.0–7.0)
Eosinophils Absolute: 0.1 10*3/uL (ref 0.0–0.5)
HCT: 42.4 % (ref 34.8–46.6)
HGB: 14 g/dL (ref 11.6–15.9)
Immature Retic Fract: 7.2 % (ref 1.60–10.00)
LYMPH%: 26.9 % (ref 14.0–49.7)
MCH: 27.9 pg (ref 25.1–34.0)
MCHC: 33 g/dL (ref 31.5–36.0)
MCV: 84.6 fL (ref 79.5–101.0)
MONO#: 0.4 10*3/uL (ref 0.1–0.9)
MONO%: 6.6 % (ref 0.0–14.0)
NEUT#: 4.4 10*3/uL (ref 1.5–6.5)
NEUT%: 65.4 % (ref 38.4–76.8)
Platelets: 232 10*3/uL (ref 145–400)
RBC: 5.01 10*6/uL (ref 3.70–5.45)
RDW: 14.7 % — ABNORMAL HIGH (ref 11.2–14.5)
Retic %: 1.78 % (ref 0.70–2.10)
Retic Ct Abs: 89.18 10*3/uL (ref 33.70–90.70)
WBC: 6.7 10*3/uL (ref 3.9–10.3)
lymph#: 1.8 10*3/uL (ref 0.9–3.3)

## 2015-07-29 LAB — LACTATE DEHYDROGENASE: LDH: 249 U/L — ABNORMAL HIGH (ref 125–245)

## 2015-07-29 NOTE — Telephone Encounter (Signed)
Sent pt to lab per [facility] 5/15 pof

## 2015-07-29 NOTE — Progress Notes (Signed)
[facility] CONSULT NOTE  Patient Care Team: Veryl Speak, FNP as PCP - General (Family Medicine)  CHIEF COMPLAINTS/PURPOSE OF CONSULTATION:  icrocytic anemia  HISTORY OF PRESENTING ILLNESS:  Lindsay Gomez 45 y.o. female is here because of chronic iron deficiency microcytic anemia. Patient reports to me that she was anemic during her childbirth which was about 7 years ago.She did recover but more recently she was once again told that she has anemia of iron deficiency and was started on oral iron supplements. In December 2016 she underwent hysterectomy. There was resumption Deficiency was related to bleeding fibroids. However in spite of that her hemoglobin apparently has been running low. I do not have any level from the recent tests of procedures. The last blood work we have was from April. She works in the [employer] and appears to be feeling she needs tired and wiped out. She says it is getting harder and harder to do her job activities. Previously she had Pica to ice but currently she does not have that.  I reviewed her records extensively and collaborated the history with the patient.  MEDICAL HISTORY:  Past Medical History  Diagnosis Date  . Anemia   . Hypertension   . Allergy     rhinitis  . Morbid obesity (HCC)   . Enlarged heart   . Shortness of breath dyspnea     with low iron  . Anxiety     SURGICAL HISTORY: Past Surgical History  Procedure Laterality Date  . Knee surgery  1992    ? arthroscopic/ right  . Cesarean section    . Abdominal hysterectomy N/A 01/29/2015    Procedure: TOTAL ABDOMINAL HYSTERECTOMY ;  Surgeon: Kirkland Hun, MD;  Location: [facility];  Service: Gynecology;  Laterality: N/A;  . Bilateral salpingectomy Bilateral 01/29/2015    Procedure: BILATERAL SALPINGECTOMY;  Surgeon: Kirkland Hun, MD;  Location: [facility];  Service: Gynecology;  Laterality: Bilateral;    SOCIAL HISTORY: Social History   Social History  .  Marital Status: Single    Spouse Name: N/A  . Number of Children: N/A  . Years of Education: N/A   Occupational History  . Not on file.   Social History Main Topics  . Smoking status: Never Smoker   . Smokeless tobacco: Never Used  . Alcohol Use: No  . Drug Use: No  . Sexual Activity: No   Other Topics Concern  . Not on file   Social History Narrative   Regular exercise    FAMILY HISTORY: Family History  Problem Relation Age of Onset  . Hypertension Mother   . Coronary artery disease Father   . Heart failure Father   . Heart attack Father   . Hypertension Other   . Hyperlipidemia Other     ALLERGIES:  is allergic to lisinopril.  MEDICATIONS:  Current Outpatient Prescriptions  Medication Sig Dispense Refill  . Azilsartan-Chlorthalidone (EDARBYCLOR) 40-25 MG TABS Take 1 tablet by mouth daily. 30 tablet 2  . Blood Glucose Monitoring Suppl (ONE TOUCH ULTRA MINI) w/Device KIT Use device to test blood sugars 1-4 times daily as instructed. 1 each 0  . buPROPion (WELLBUTRIN XL) 150 MG 24 hr tablet Take 1 tablet (150 mg total) by mouth daily. 30 tablet 0  . Dulaglutide (TRULICITY) 0.75 MG/0.5ML SOPN Inject 0.75 mg into the skin once a week. 4 pen 2  . glucose blood test strip Use as instructed 100 each 12  . metFORMIN (GLUMETZA) 500 MG (MOD)  24 hr tablet Take 1 tablet (500 mg total) by mouth daily with breakfast. 30 tablet 0  . potassium chloride SA (K-DUR,KLOR-CON) 20 MEQ tablet Take 1 tablet (20 mEq total) by mouth 2 (two) times daily. 180 tablet 1   No current facility-administered medications for this visit.    REVIEW OF SYSTEMS:   Constitutional: Denies fevers, chills or abnormal night sweats profound fatigue to the point that she was falling asleep Eyes: Denies blurriness of vision, double vision or watery eyes Ears, nose, mouth, throat, and face: Denies mucositis or sore throat Respiratory: Denies cough, dyspnea or wheezes Cardiovascular: Denies palpitation, chest  discomfort or lower extremity swelling Gastrointestinal:  Denies nausea, heartburn or change in bowel habits Skin: Denies abnormal skin rashes Lymphatics: Denies new lymphadenopathy or easy bruising Neurological:Denies numbness, tingling or new weaknesses Behavioral/Psych: Mood is stable, no new changes  Breast:  Denies any palpable lumps or discharge All other systems were reviewed with the patient and are negative.  PHYSICAL EXAMINATION: ECOG PERFORMANCE STATUS: 2 - Symptomatic, <50% confined to bed  Filed Vitals:   07/29/15 1527  BP: 157/104  Pulse: 96  Temp: 98.3 F (36.8 C)  Resp: 21   Filed Weights   07/29/15 1527  Weight: 288 lb 1.6 oz (130.681 kg)    GENERAL:alert, no distress and comfortable SKIN: skin color, texture, turgor are normal, no rashes or significant lesions EYES: normal, conjunctiva are pink and non-injected, sclera clear OROPHARYNX:no exudate, no erythema and lips, buccal mucosa, and tongue normal  NECK: supple, thyroid normal size, non-tender, without nodularity LYMPH:  no palpable lymphadenopathy in the cervical, axillary or inguinal LUNGS: clear to auscultation and percussion with normal breathing effort HEART: regular rate & rhythm and no murmurs and no lower extremity edema ABDOMEN:abdomen soft, non-tender and normal bowel sounds Musculoskeletal:no cyanosis of digits and no clubbing  PSYCH: alert & oriented x 3 with fluent speech NEURO: no focal motor/sensory deficits   LABORATORY DATA:  I have reviewed the data as listed Lab Results  Component Value Date   WBC 6.7 07/29/2015   HGB 14.0 07/29/2015   HCT 42.4 07/29/2015   MCV 84.6 07/29/2015   PLT 232 07/29/2015   Lab Results  Component Value Date   NA 137 07/29/2015   K 3.3* 07/29/2015   CL 95* 06/17/2015   CO2 31* 07/29/2015    RADIOGRAPHIC STUDIES: I have personally reviewed the radiological reports and agreed with the findings in the report.  ASSESSMENT AND PLAN:  Microcytic  hypochromic anemia previously diagnosed with a hemoglobin of 10.5.Today her hemoglobin is 14 g. MCV is now normal at 84.6. Patient has not been taking oral iron for the past 1 month. We are also awaiting iron studies today. If the iron studies are normal and since the hemoglobin is normal, anemia is unlikely to be the cause of her fatigue. I do not recommend IV iron infusionif her levels come back normal. If she was found to be low, then we would give HER-2 doses of IV iron therapy.  Profound fatigue: Could be related to her current medications as well as poorly controlled diabetes. i will call the patient tomorrow to discuss the blood work results and to come up with a final treatment plan.  All questions were answered. The patient knows to call the clinic with any problems, questions or concerns.    Rulon Eisenmenger, MD 07/29/2015

## 2015-07-29 NOTE — Progress Notes (Signed)
Upon assessment, pt reports helplessness and hopelessness.  Pt states she was recently started on medicine for this which she says is helping.  Pt denies any desire to harm herself or others.  Pt verbalizes understanding to let us know should she develop desires to harm herself or others.  Pt being followed by prescribing physician for these complaints.  Pt reports no other concerns at time of evaluation.

## 2015-07-30 LAB — IRON AND TIBC
%SAT: 18 % — ABNORMAL LOW (ref 21–57)
Iron: 56 ug/dL (ref 41–142)
TIBC: 309 ug/dL (ref 236–444)
UIBC: 252 ug/dL (ref 120–384)

## 2015-07-30 LAB — FERRITIN: Ferritin: 126 ng/ml (ref 9–269)

## 2015-07-31 LAB — HEMOGLOBINOPATHY EVALUATION
HGB C: 0 %
HGB S: 0 %
Hemoglobin A2 Quantitation: 2 % (ref 0.7–3.1)
Hemoglobin F Quantitation: 0 % (ref 0.0–2.0)
Hgb A: 98 % (ref 94.0–98.0)

## 2015-08-08 ENCOUNTER — Other Ambulatory Visit: Payer: Self-pay | Admitting: Family

## 2015-08-15 ENCOUNTER — Encounter: Payer: Self-pay | Admitting: Dietician

## 2015-08-15 ENCOUNTER — Encounter: Payer: BC Managed Care – PPO | Attending: Family | Admitting: Dietician

## 2015-08-15 VITALS — Ht 62.0 in | Wt 289.0 lb

## 2015-08-15 DIAGNOSIS — E119 Type 2 diabetes mellitus without complications: Secondary | ICD-10-CM | POA: Insufficient documentation

## 2015-08-15 NOTE — Patient Instructions (Signed)
Rethink what you drink.  Avoid drinking beverages with carbohydrates. Choose wisely, eat slowly, stop when you are satisfied or full. Avoid eating in front of the TV or other electronics. Aim for 3 Carb Choices per meal (45 grams) +/- 1 either way  Aim for 0-1 Carbs per snack if hungry  Include protein in moderation with your meals and snacks Consider reading food labels for Total Carbohydrate and Fat Grams of foods Consider  increasing your activity level by walking or gym for 30 minutes daily as tolerated Consider checking BG at alternate times per day as directed by MD  Consider taking medication as directed by MD

## 2015-08-15 NOTE — Progress Notes (Signed)
Diabetes Self-Management Education  Visit Type: First/Initial  Appt. Start Time: 1550 Appt. End Time: [id number]  08/15/2015  [name], identified by name and date of birth, is a 45 y.o. female with a diagnosis of Diabetes: Type 2 which was diagnosed in April with a HgbA1C of 11.9%.  Her HgbA1C was 5.7 11/14/14.  Her weight was 20 lbs less at that time, she was exercising regularly, and juicing twice a day having one healthy meal.  She had a hysterectomy in November and had increased problems with depression afterward.  She stated that in February she would "binge" on Duck donuts, candy bar and grape soda daily.  She is very tired today.  She has been checking her blood sugar 2-3 times per week with blood sugar readings between 200-500 2 hours after dinner.  She has a hx of HTN and states that she needs screened for OSA.  She forgets to take her medication at times and is having difficulty dealing with the diabetes diagnosis.  She is currently taking Trulicity and Metformin for her diabetes.  Weight hx: Lowest adult weight 266 lbs this fall. Highest adult weight 289 lbs today.   She has always struggled with her weight.  Patient lives with her 72 yo daughter who shares the cooking.  She does most of the shopping.  Her daughter does not cook healthfully.  She works as a Oncologist.    ASSESSMENT  Height 5\' 2"  (1.575 m), weight 289 lb (131.09 kg), last menstrual period 12/18/2014. Body mass index is 52.85 kg/(m^2).      Diabetes Self-Management Education - 08/15/15 1602    Visit Information   Visit Type First/Initial   Initial Visit   Diabetes Type Type 2   Are you currently following a meal plan? No   Are you taking your medications as prescribed? Yes   Date Diagnosed 06/2015   Psychosocial Assessment   Patient Belief/Attitude about Diabetes Other (comment)  "I hate it."  Not motivated.  Decreased dicipline.   Self-care barriers Other (comment)  increased fatigue,  depression   Self-management support Doctor's office;Family   Other persons present Patient   Patient Concerns Nutrition/Meal planning;Weight Control;Glycemic Control   Preferred Learning Style No preference indicated   Learning Readiness Ready   How often do you need to have someone help you when you read instructions, pamphlets, or other written materials from your doctor or pharmacy? 1 - Never   What is the last grade level you completed in school? 6 years college   Pre-Education Assessment   Patient understands the diabetes disease and treatment process. Needs Instruction   Patient understands incorporating nutritional management into lifestyle. Needs Instruction   Patient undertands incorporating physical activity into lifestyle. Needs Instruction   Patient understands using medications safely. Needs Instruction   Patient understands monitoring blood glucose, interpreting and using results Needs Instruction   Patient understands prevention, detection, and treatment of acute complications. Needs Instruction   Patient understands prevention, detection, and treatment of chronic complications. Needs Instruction   Patient understands how to develop strategies to address psychosocial issues. Needs Instruction   Patient understands how to develop strategies to promote health/change behavior. Needs Instruction   Complications   Last HgB A1C per patient/outside source 11.9 %  06/17/15   How often do you check your blood sugar? 3-4 times / week  2 hours after last meal   Postprandial Blood glucose range (mg/dL) >200  232-511   Number of hypoglycemic episodes per month  0   Number of hyperglycemic episodes per week 21   Can you tell when your blood sugar is high? Yes  irritable and decreased vision   What do you do if your blood sugar is high? "Make sure I have taken medicine.  Drink water."   Have you had a dilated eye exam in the past 12 months? No   Have you had a dental exam in the past 12  months? No   Are you checking your feet? No   Dietary Intake   Breakfast [employer]:  Egg McMuffin, yogurt parfait, water  7   Snack (morning) none   Lunch [employer] (Popcorn chicken, broccoli, strawberries)  11   Snack (afternoon) Fast food sandwich  3   Dinner Speghetti and meatballs OR pizza and sub OR spinach and cheesy mushroom chicken  Spaghetti and meatballs OR pizza and sub   Snack (evening) none, dried snap peas   Beverage(s) water, sweet green tea, regular lemonade, juice once per week   Exercise   Exercise Type Moderate (swimming / aerobic walking);Light (walking / raking leaves)  going to the gym and walking    How many days per week to you exercise? 5   How many minutes per day do you exercise? 30   Total minutes per week of exercise 150   Patient Education   Previous Diabetes Education No   Disease state  Definition of diabetes, type 1 and 2, and the diagnosis of diabetes;Explored patient's options for treatment of their diabetes   Nutrition management  Role of diet in the treatment of diabetes and the relationship between the three main macronutrients and blood glucose level;Food label reading, portion sizes and measuring food.;Information on hints to eating out and maintain blood glucose control.;Meal options for control of blood glucose level and chronic complications.;Reviewed blood glucose goals for pre and post meals and how to evaluate the patients' food intake on their blood glucose level.   Physical activity and exercise  Role of exercise on diabetes management, blood pressure control and cardiac health.   Monitoring Identified appropriate SMBG and/or A1C goals.;Daily foot exams;Yearly dilated eye exam   Acute complications Discussed and identified patients' treatment of hyperglycemia.   Chronic complications Relationship between chronic complications and blood glucose control   Psychosocial adjustment Worked with patient to identify barriers to care and  solutions;Role of stress on diabetes   Individualized Goals (developed by patient)   Nutrition General guidelines for healthy choices and portions discussed;Follow meal plan discussed   Physical Activity Exercise 5-7 days per week;30 minutes per day   Monitoring  test my blood glucose as discussed   Reducing Risk get labs drawn;do foot checks daily;examine blood glucose patterns   Health Coping ask for help with (comment)  blood sugar control   Post-Education Assessment   Patient understands the diabetes disease and treatment process. Demonstrates understanding / competency   Patient understands incorporating nutritional management into lifestyle. Demonstrates understanding / competency   Patient undertands incorporating physical activity into lifestyle. Demonstrates understanding / competency   Patient understands using medications safely. Demonstrates understanding / competency   Patient understands monitoring blood glucose, interpreting and using results Demonstrates understanding / competency   Patient understands prevention, detection, and treatment of acute complications. Demonstrates understanding / competency   Patient understands prevention, detection, and treatment of chronic complications. Demonstrates understanding / competency   Patient understands how to develop strategies to address psychosocial issues. Demonstrates understanding / competency   Patient understands how to  develop strategies to promote health/change behavior. Demonstrates understanding / competency   Outcomes   Expected Outcomes Demonstrated interest in learning. Expect positive outcomes   Future DMSE PRN   Program Status Completed      Individualized Plan for Diabetes Self-Management Training:   Learning Objective:  Patient will have a greater understanding of diabetes self-management. Patient education plan is to attend individual and/or group sessions per assessed needs and concerns.   Plan:   Patient  Instructions  Rethink what you drink.  Avoid drinking beverages with carbohydrates. Choose wisely, eat slowly, stop when you are satisfied or full. Avoid eating in front of the TV or other electronics. Aim for 3 Carb Choices per meal (45 grams) +/- 1 either way  Aim for 0-1 Carbs per snack if hungry  Include protein in moderation with your meals and snacks Consider reading food labels for Total Carbohydrate and Fat Grams of foods Consider  increasing your activity level by walking or gym for 30 minutes daily as tolerated Consider checking BG at alternate times per day as directed by MD  Consider taking medication as directed by MD     Expected Outcomes:  Demonstrated interest in learning. Expect positive outcomes  Education material provided: Living Well with Diabetes, Food label handouts, A1C conversion sheet, Meal plan card, My Plate, Snack sheet and Support group flyer, breakfast ideas  If problems or questions, patient to contact team via:  Phone and Email  Future DSME appointment: PRN

## 2015-09-30 ENCOUNTER — Other Ambulatory Visit (INDEPENDENT_AMBULATORY_CARE_PROVIDER_SITE_OTHER): Payer: BC Managed Care – PPO

## 2015-09-30 ENCOUNTER — Telehealth: Payer: Self-pay | Admitting: Family

## 2015-09-30 ENCOUNTER — Ambulatory Visit (INDEPENDENT_AMBULATORY_CARE_PROVIDER_SITE_OTHER): Payer: BC Managed Care – PPO | Admitting: Family

## 2015-09-30 ENCOUNTER — Encounter: Payer: Self-pay | Admitting: Family

## 2015-09-30 VITALS — BP 156/96 | HR 118 | Temp 98.5°F | Ht 62.0 in | Wt 298.0 lb

## 2015-09-30 DIAGNOSIS — E119 Type 2 diabetes mellitus without complications: Secondary | ICD-10-CM

## 2015-09-30 DIAGNOSIS — I10 Essential (primary) hypertension: Secondary | ICD-10-CM | POA: Diagnosis not present

## 2015-09-30 DIAGNOSIS — F329 Major depressive disorder, single episode, unspecified: Secondary | ICD-10-CM

## 2015-09-30 DIAGNOSIS — F32A Depression, unspecified: Secondary | ICD-10-CM

## 2015-09-30 LAB — HEMOGLOBIN A1C: Hgb A1c MFr Bld: 12 % — ABNORMAL HIGH (ref 4.6–6.5)

## 2015-09-30 MED ORDER — VILAZODONE HCL 10 & 20 & 40 MG PO KIT: PACK | ORAL | Status: DC

## 2015-09-30 MED ORDER — FLUCONAZOLE 150 MG PO TABS: 150.0000 mg | ORAL_TABLET | Freq: Once | ORAL | Status: DC

## 2015-09-30 MED ORDER — DULAGLUTIDE 1.5 MG/0.5ML ~~LOC~~ SOAJ: 1.5000 mg | SUBCUTANEOUS | Status: DC

## 2015-09-30 MED ORDER — GLIPIZIDE ER 5 MG PO TB24: 5.0000 mg | ORAL_TABLET | Freq: Every day | ORAL | Status: DC

## 2015-09-30 NOTE — Telephone Encounter (Signed)
State patient is missing script for diflucan.

## 2015-09-30 NOTE — Assessment & Plan Note (Signed)
Depression remains labile and patient self discontinued Wellbutrin secondary to adverse side effects of agitation and sleeping. Discussed options of treatment including additional medications and counseling. Refer to psychiatry for further assessment and evaluation. Start Viibryd with sample provided. Denies suicidal ideation. Follow up in 1 month or sooner if needed.

## 2015-09-30 NOTE — Assessment & Plan Note (Signed)
Continues to remain an issue with BMI of 54.9. Recent attended nutrition education with Diabetes Education. Encouraged to avoid processed/sugary foods and drinks while emphasizing nutrient dense foods. Increase physical activity. Recommend weight loss of 5-10% of current body weight. Will continue to monitor.

## 2015-09-30 NOTE — Progress Notes (Signed)
Subjective:    Patient ID: Lindsay Gomez, female    DOB: [birth year], 45 y.o.   MRN: [id number]  Chief Complaint  Patient presents with  . Diabetes    metformin causing diarrhea and urgency    HPI:  Lindsay Gomez is a 45 y.o. female who  has a past medical history of Anemia; Hypertension; Allergy; Morbid obesity ([place]); Enlarged heart; Shortness of breath dyspnea; and Anxiety. and presents today for a follow up office visit.  1.) Hypertension - This is a chronic problem. Currently prescribed azilsartan-chlorthalidone. Reports taking her blood pressure medication as prescribed and denies adverse side effects. Does not currently take her blood pressure at home. Does not currently work on her nutrition to help with her blood pressure. Does express some changes in vision and occasional headaches. Has had to get reading glasses.   BP Readings from Last 3 Encounters:  09/30/15 156/96  07/29/15 157/104  06/24/15 144/98    2.) Type 2 diabetes - this is a chronic problem. Currently maintained on metformin and Trulicity. Notes that the metformin is resulting in loose stools in the mornings with the occasional bowel movements in the afternoon. Does express some changes in vision. Denies numbness or tingling which she has had previously in her feet. Seen by Diabetes Education with suggestions provided endorsing that the changes suggested have been challenging.   Lab Results  Component Value Date   HGBA1C 12.0* 09/30/2015    3.) Depression - This is a chronic problem. Currently maintained on bupropion. Reports that she has stopped taking the medication and notes that it either makes her sleepy or she is becomes agitated. Endorses increased amount of sleeping.    Allergies  Allergen Reactions  . Lisinopril Other (See Comments)    Cough      Current Outpatient Prescriptions on File Prior to Visit  Medication Sig Dispense Refill  . Azilsartan-Chlorthalidone (EDARBYCLOR) 40-25 MG TABS  Take 1 tablet by mouth daily. 30 tablet 2  . Blood Glucose Monitoring Suppl (ONE TOUCH ULTRA MINI) w/Device KIT Use device to test blood sugars 1-4 times daily as instructed. 1 each 0  . glucose blood test strip Use as instructed 100 each 12  . potassium chloride SA (K-DUR,KLOR-CON) 20 MEQ tablet Take 1 tablet (20 mEq total) by mouth 2 (two) times daily. (Patient not taking: Reported on 09/30/2015) 180 tablet 1   No current facility-administered medications on file prior to visit.     Past Surgical History  Procedure Laterality Date  . Knee surgery  1992    ? arthroscopic/ right  . Cesarean section    . Abdominal hysterectomy N/A 01/29/2015    Procedure: TOTAL ABDOMINAL HYSTERECTOMY ;  Surgeon: Ena Dawley, MD;  Location: [facility];  Service: Gynecology;  Laterality: N/A;  . Bilateral salpingectomy Bilateral 01/29/2015    Procedure: BILATERAL SALPINGECTOMY;  Surgeon: Ena Dawley, MD;  Location: [facility];  Service: Gynecology;  Laterality: Bilateral;    Past Medical History  Diagnosis Date  . Anemia   . Hypertension   . Allergy     rhinitis  . Morbid obesity ([place])   . Enlarged heart   . Shortness of breath dyspnea     with low iron  . Anxiety      Review of Systems  Eyes:       Denies changes in vision  Endocrine: Negative for polydipsia, polyphagia and polyuria.  Neurological: Negative for numbness.      Objective:    BP  156/96 mmHg  Pulse 118  Temp(Src) 98.5 F (36.9 C) (Oral)  Ht _0  (1.575 m)  Wt 298 lb (135.172 kg)  BMI 54.49 kg/m2  SpO2 96%  LMP 12/18/2014 (Exact Date) Nursing note and vital signs reviewed.  Physical Exam  Constitutional: She is oriented to person, place, and time. She appears well-developed and well-nourished. No distress.  Cardiovascular: Normal rate, regular rhythm, normal heart sounds and intact distal pulses.   Pulmonary/Chest: Effort normal and breath sounds normal.  Neurological: She is alert and oriented to person, place, and  time.  Skin: Skin is warm and dry.  Psychiatric: Her behavior is normal. Judgment and thought content normal. She exhibits a depressed mood.       Assessment & Plan:   Problem List Items Addressed This Visit      Cardiovascular and Mediastinum   Essential hypertension    Hypertension remains above goal 140/90 with current regimen. Questionable patient compliance given refill pattern. Diet is a major contributing factor as she continues to gain weight. Concern for increased risk of end organ damage expressed to patient. Continue current dosage of azilsartan-chlorthalidone. If blood pressure remains elevated will additional agent. Denies worst headache of life, but does have some symptoms of end organ damage related to both blood pressure and diabetes.         Endocrine   Type 2 diabetes mellitus (HCC) - Primary    Type 2 diabetes remains uncontrolled with current regimen and describes loose stools leading to compliance concerns. Discontinue metformin. Start glipizide ER and increase Trulicity. Emphasized importance of nutritional intake as discussed with Diabetes Educator. Patient does not appear to have motivation to make changes. Maintained on azilsartan for CAD risk reduction. Foot exam up to date. Encouraged to complete diabetic eye exam independently. Refer to endocrinology for assistance in management. Monitor blood sugars at home. Obtain A1c today. Follow up in 3 months or sooner if needed.       Relevant Medications   glipiZIDE (GLUCOTROL XL) 5 MG 24 hr tablet   Dulaglutide (TRULICITY) 1.5 UK/0.2RK SOPN   Other Relevant Orders   Ambulatory referral to Endocrinology   Hemoglobin A1c (Completed)     Other   Morbid obesity ([place])    Continues to remain an issue with BMI of 54.9. Recent attended nutrition education with Diabetes Education. Encouraged to avoid processed/sugary foods and drinks while emphasizing nutrient dense foods. Increase physical activity. Recommend weight loss of  5-10% of current body weight. Will continue to monitor.       Relevant Medications   glipiZIDE (GLUCOTROL XL) 5 MG 24 hr tablet   Dulaglutide (TRULICITY) 1.5 YH/0.6CB SOPN   Depression    Depression remains labile and patient self discontinued Wellbutrin secondary to adverse side effects of agitation and sleeping. Discussed options of treatment including additional medications and counseling. Refer to psychiatry for further assessment and evaluation. Start Viibryd with sample provided. Denies suicidal ideation. Follow up in 1 month or sooner if needed.       Relevant Medications   Vilazodone HCl 10 & 20 & 40 MG KIT   Other Relevant Orders   Ambulatory referral to Psychiatry       I have discontinued Ms. Mccaig's metFORMIN, buPROPion, Dulaglutide, and metFORMIN. I am also having her start on glipiZIDE, Dulaglutide, Vilazodone HCl, and fluconazole. Additionally, I am having her maintain her potassium chloride SA, Azilsartan-Chlorthalidone, ONE TOUCH ULTRA MINI, and glucose blood.   Meds ordered this encounter  Medications  . glipiZIDE (  GLUCOTROL XL) 5 MG 24 hr tablet    Sig: Take 1 tablet (5 mg total) by mouth daily with breakfast.    Dispense:  30 tablet    Refill:  2    Order Specific Question:  Supervising Provider    Answer:  Pricilla Holm A [[id number]]  . Dulaglutide (TRULICITY) 1.5 TU/8.8KC SOPN    Sig: Inject 1.5 mg into the skin once a week.    Dispense:  4 pen    Refill:  2    Order Specific Question:  Supervising Provider    Answer:  Pricilla Holm A [[id number]]  . Vilazodone HCl 10 & 20 & 40 MG KIT    Sig: Take 1 tablet by mouth daily per kit instructions.    Dispense:  1 kit    Refill:  0    Order Specific Question:  Supervising Provider    Answer:  Pricilla Holm A [[id number]]  . fluconazole (DIFLUCAN) 150 MG tablet    Sig: Take 1 tablet (150 mg total) by mouth once.    Dispense:  1 tablet    Refill:  0    Order Specific Question:  Supervising Provider     Answer:  Pricilla Holm A [[id number]]     Follow-up: No Follow-up on file.  Mauricio Po, FNP

## 2015-09-30 NOTE — Assessment & Plan Note (Signed)
Type 2 diabetes remains uncontrolled with current regimen and describes loose stools leading to compliance concerns. Discontinue metformin. Start glipizide ER and increase Trulicity. Emphasized importance of nutritional intake as discussed with Diabetes Educator. Patient does not appear to have motivation to make changes. Maintained on azilsartan for CAD risk reduction. Foot exam up to date. Encouraged to complete diabetic eye exam independently. Refer to endocrinology for assistance in management. Monitor blood sugars at home. Obtain A1c today. Follow up in 3 months or sooner if needed.

## 2015-09-30 NOTE — Assessment & Plan Note (Signed)
Hypertension remains above goal 140/90 with current regimen. Questionable patient compliance given refill pattern. Diet is a major contributing factor as she continues to gain weight. Concern for increased risk of end organ damage expressed to patient. Continue current dosage of azilsartan-chlorthalidone. If blood pressure remains elevated will additional agent. Denies worst headache of life, but does have some symptoms of end organ damage related to both blood pressure and diabetes.

## 2015-09-30 NOTE — Patient Instructions (Signed)
Thank you for choosing [facility].  Summary/Instructions:  Please STOP taking METFORMIN and BURPROPRION.  START taking VILAZODONE and GLIPIDIZIDE ER.  They will call to schedule your appointment with psychiatry and endocrinology  Please work on nutrition and exercise as instructed.   Your prescription(s) have been submitted to your pharmacy or been printed and provided for you. Please take as directed and contact our office if you believe you are having problem(s) with the medication(s) or have any questions.  Please stop by the lab on the lower level of the building for your blood work. Your results will be released to [facility] (or called to you) after review, usually within 72 hours after test completion. If any changes need to be made, you will be notified at that same time.  1. The lab is open from 7:30am to 5:30 pm Monday-Friday  2. No appointment is necessary  3. Fasting (if needed) is 6-8 hours after food and drink; black coffee  and water are okay   If your symptoms worsen or fail to improve, please contact our office for further instruction, or in case of emergency go directly to the emergency room at the closest medical facility.

## 2015-09-30 NOTE — Telephone Encounter (Signed)
Medication has been sent.  

## 2015-09-30 NOTE — Progress Notes (Signed)
Pre visit review using our clinic review tool, if applicable. No additional management support is needed unless otherwise documented below in the visit note. 

## 2015-10-01 ENCOUNTER — Telehealth: Payer: Self-pay | Admitting: Family

## 2015-10-01 NOTE — Telephone Encounter (Signed)
Notified patient.

## 2015-10-01 NOTE — Telephone Encounter (Signed)
Patient has seen labs on MyChart.  Please give call back.  Patient has questions in regard.

## 2015-10-02 NOTE — Telephone Encounter (Signed)
Please find out what questions patient has about her labs.

## 2015-10-03 NOTE — Telephone Encounter (Signed)
Please have her monitor her blood sugars over the next week with the new medications. Let me know if 1 week an average of blood sugars and we will make medication adjustments as we just increased dose of Trulicity and started glipizide.

## 2015-10-03 NOTE — Telephone Encounter (Signed)
Called pt she states wanting to know is there anything else Lindsay Gomez want her to do since her A1C went up. Currently taking everything as he rx. Also pt states she has never heard back concerning the Sleep Study he ordered. Inform pt per chart referral was placed, but hasn't been set-up will send [facility] msg to give her a call...[name]

## 2015-10-04 NOTE — Telephone Encounter (Signed)
Notified pt w/Greg response.../lmb 

## 2015-10-08 ENCOUNTER — Encounter (HOSPITAL_COMMUNITY): Payer: Self-pay

## 2015-10-15 ENCOUNTER — Ambulatory Visit (HOSPITAL_COMMUNITY): Payer: BC Managed Care – PPO | Admitting: Psychiatry

## 2015-10-17 ENCOUNTER — Encounter: Payer: Self-pay | Admitting: Family

## 2015-10-17 MED ORDER — TERCONAZOLE 0.4 % VA CREA: 1.0000 | TOPICAL_CREAM | Freq: Every day | VAGINAL | 0 refills | Status: DC

## 2015-10-23 ENCOUNTER — Ambulatory Visit (HOSPITAL_COMMUNITY): Payer: BC Managed Care – PPO | Admitting: Psychiatry

## 2015-10-23 DIAGNOSIS — F321 Major depressive disorder, single episode, moderate: Secondary | ICD-10-CM

## 2015-10-23 DIAGNOSIS — F329 Major depressive disorder, single episode, unspecified: Secondary | ICD-10-CM | POA: Diagnosis not present

## 2015-10-25 NOTE — Progress Notes (Signed)
Lindsay Gomez is a 45 y.o. female patient.    Pt. Presented as cooperative, oriented, made good eye contact, responsive to questions. Pt. Was referred by her primary care physician due to report of multiple symptoms consistent with major depressive disorder and generalized anxiety disorder including tearfulness, lethargy and loss of motivation, increased appetite and significant weight gain, restlessness, hypersomnia, irritability and general low frustration tolerance. Pt. Reported that she was recently diagnosed with Type II diabetes in April 2017 and that her blood sugar has been poorly managed with medication since that time. Pt. 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     Nancie Neas, LPC

## 2015-10-28 DIAGNOSIS — F321 Major depressive disorder, single episode, moderate: Secondary | ICD-10-CM | POA: Insufficient documentation

## 2015-10-30 ENCOUNTER — Ambulatory Visit (HOSPITAL_COMMUNITY): Payer: BC Managed Care – PPO | Admitting: Licensed Clinical Social Worker

## 2015-11-07 ENCOUNTER — Encounter: Payer: Self-pay | Admitting: Endocrinology

## 2015-11-07 ENCOUNTER — Ambulatory Visit (INDEPENDENT_AMBULATORY_CARE_PROVIDER_SITE_OTHER): Payer: BC Managed Care – PPO | Admitting: Endocrinology

## 2015-11-07 VITALS — BP 128/68 | HR 78 | Ht 62.0 in | Wt 293.0 lb

## 2015-11-07 DIAGNOSIS — E119 Type 2 diabetes mellitus without complications: Secondary | ICD-10-CM | POA: Diagnosis not present

## 2015-11-07 DIAGNOSIS — Z794 Long term (current) use of insulin: Secondary | ICD-10-CM

## 2015-11-07 MED ORDER — BROMOCRIPTINE MESYLATE 2.5 MG PO TABS: ORAL_TABLET | ORAL | 11 refills | Status: DC

## 2015-11-07 NOTE — Progress Notes (Signed)
Subjective:    Patient ID: Lindsay Gomez, female    DOB: 06/13/1970, 45 y.o.   MRN: [id number]  HPI pt states DM was dx'ed in early 2017; she was rx'ed several oral meds, and trulicity; she has mild neuropathy of the lower extremities; she is unaware of any associated chronic complications; she has never been on insulin; pt says his diet ir poor, but exercise is good; she has never had GDM, pancreatitis, severe hypoglycemia or DKA.  She says cbg's are persistently over 300.  She did not tolerate metformin (diarrhea).   Past Medical History:  Diagnosis Date  . Allergy    rhinitis  . Anemia   . Anxiety   . Enlarged heart   . Hypertension   . Morbid obesity ([place])   . Shortness of breath dyspnea    with low iron    Past Surgical History:  Procedure Laterality Date  . ABDOMINAL HYSTERECTOMY N/A 01/29/2015   Procedure: TOTAL ABDOMINAL HYSTERECTOMY ;  Surgeon: Ena Dawley, MD;  Location: [facility];  Service: Gynecology;  Laterality: N/A;  . BILATERAL SALPINGECTOMY Bilateral 01/29/2015   Procedure: BILATERAL SALPINGECTOMY;  Surgeon: Ena Dawley, MD;  Location: [facility];  Service: Gynecology;  Laterality: Bilateral;  . CESAREAN SECTION    . KNEE SURGERY  1992   ? arthroscopic/ right    Social History   Social History  . Marital status: Single    Spouse name: N/A  . Number of children: N/A  . Years of education: N/A   Occupational History  . Not on file.   Social History Main Topics  . Smoking status: Never Smoker  . Smokeless tobacco: Never Used  . Alcohol use No  . Drug use: No  . Sexual activity: No   Other Topics Concern  . Not on file   Social History Narrative   Regular exercise    Current Outpatient Prescriptions on File Prior to Visit  Medication Sig Dispense Refill  . Azilsartan-Chlorthalidone (EDARBYCLOR) 40-25 MG TABS Take 1 tablet by mouth daily. 30 tablet 2  . Blood Glucose Monitoring Suppl (ONE TOUCH ULTRA MINI) w/Device KIT Use device to test  blood sugars 1-4 times daily as instructed. 1 each 0  . Dulaglutide (TRULICITY) 1.5 DS/2.8JG SOPN Inject 1.5 mg into the skin once a week. 4 pen 2  . glipiZIDE (GLUCOTROL XL) 5 MG 24 hr tablet Take 1 tablet (5 mg total) by mouth daily with breakfast. 30 tablet 2  . glucose blood test strip Use as instructed 100 each 12  . fluconazole (DIFLUCAN) 150 MG tablet Take 1 tablet (150 mg total) by mouth once. (Patient not taking: Reported on 11/07/2015) 1 tablet 0  . potassium chloride SA (K-DUR,KLOR-CON) 20 MEQ tablet Take 1 tablet (20 mEq total) by mouth 2 (two) times daily. (Patient not taking: Reported on 09/30/2015) 180 tablet 1  . terconazole (TERAZOL 7) 0.4 % vaginal cream Place 1 applicator vaginally at bedtime. (Patient not taking: Reported on 11/07/2015) 45 g 0  . Vilazodone HCl 10 & 20 & 40 MG KIT Take 1 tablet by mouth daily per kit instructions. (Patient not taking: Reported on 11/07/2015) 1 kit 0   No current facility-administered medications on file prior to visit.     Allergies  Allergen Reactions  . Lisinopril Other (See Comments)    Cough     Family History  Problem Relation Age of Onset  . Hypertension Mother   . Diabetes Mother   . Coronary artery disease Father   .  Heart failure Father   . Heart attack Father   . Diabetes Father   . Hypertension Other   . Hyperlipidemia Other     BP 128/68   Pulse 78   Ht _0  (1.575 m)   Wt 293 lb (132.9 kg)   LMP 12/18/2014 (Exact Date)   BMI 53.59 kg/m    Review of Systems denies headache, chest pain, sob, n/v, muscle cramps, cold intolerance, rhinorrhea, and easy bruising.  She has polyuria, weight gain, intermittent blurry vision, leg cramps, depression, and fatigue.     Objective:   Physical Exam VS: see vs page GEN: no distress.  Morbid obesity.  HEAD: head: no deformity eyes: no periorbital swelling, no proptosis external nose and ears are normal mouth: no lesion seen NECK: supple, thyroid is not enlarged CHEST  WALL: no deformity LUNGS: clear to auscultation CV: reg rate and rhythm, no murmur ABD: abdomen is soft, nontender.  no hepatosplenomegaly.  not distended.  no hernia MUSCULOSKELETAL: muscle bulk and strength are grossly normal.  no obvious joint swelling.  gait is normal and steady EXTEMITIES: no deformity.  no ulcer on the feet.  feet are of normal color and temp, but the skin is dry.  no edema.  Old healed surgical scar at the right knee. PULSES: dorsalis pedis intact bilat.  no carotid bruit NEURO:  cn 2-12 grossly intact.   readily moves all 4's.  sensation is intact to touch on the feet SKIN:  Normal texture and temperature.  No rash or suspicious lesion is visible.  Acanthosis nigricans at the neck NODES:  None palpable at the neck.   PSYCH: alert, well-oriented.  Does not appear anxious nor depressed.   Lab Results  Component Value Date   CREATININE 1.1 07/29/2015   BUN 11.5 07/29/2015   NA 137 07/29/2015   K 3.3 (L) 07/29/2015   CL 95 (L) 06/17/2015   CO2 31 (H) 07/29/2015    Lab Results  Component Value Date   HGBA1C 12.0 (H) 09/30/2015   i personally reviewed electrocardiogram tracing (01/09/15):  Indication: HTN Impression: left ventricular hypertrophy  I have reviewed outside records, and summarized: Pt was noted to have severely elevated a1c, and referred here.  She was also noted to have HTN and cardiomegaly.       Assessment & Plan:  Type 2 DM: severe exacerbation: I advised insulin, but she declines.  We discussed risks.  She also declines invokana.  Morbid obesity: she declines surgery.   Diarrhea: due to metformin.

## 2015-11-07 NOTE — Patient Instructions (Addendum)
good diet and exercise significantly improve the control of your diabetes.  please let me know if you wish to be referred to a dietician.  high blood sugar is very risky to your health.  you should see an eye doctor and dentist every year.  It is very important to get all recommended vaccinations.  check your blood sugar twice a day.  vary the time of day when you check, between before the 3 meals, and at bedtime.  also check if you have symptoms of your blood sugar being too high or too low.  please keep a record of the readings and bring it to your next appointment here (or you can bring the meter itself).  You can write it on any piece of paper.  please call us sooner if your blood sugar goes below 70, or if you have a lot of readings over 200. Controlling your blood pressure and cholesterol drastically reduces the damage diabetes does to your body.  Those who smoke should quit.  Please discuss these with your doctor.  I have sent a prescription to your pharmacy, to add "bromocriptine." Please come back for a follow-up appointment in 6 weeks, when we'll talk more about the insulin.

## 2015-11-13 ENCOUNTER — Ambulatory Visit (HOSPITAL_BASED_OUTPATIENT_CLINIC_OR_DEPARTMENT_OTHER): Payer: BC Managed Care – PPO | Attending: Family | Admitting: Internal Medicine

## 2015-11-13 VITALS — Ht 62.0 in | Wt 289.0 lb

## 2015-11-13 DIAGNOSIS — Z79899 Other long term (current) drug therapy: Secondary | ICD-10-CM | POA: Insufficient documentation

## 2015-11-13 DIAGNOSIS — Z6841 Body Mass Index (BMI) 40.0 and over, adult: Secondary | ICD-10-CM | POA: Insufficient documentation

## 2015-11-13 DIAGNOSIS — E669 Obesity, unspecified: Secondary | ICD-10-CM | POA: Diagnosis not present

## 2015-11-13 DIAGNOSIS — G4733 Obstructive sleep apnea (adult) (pediatric): Secondary | ICD-10-CM | POA: Insufficient documentation

## 2015-11-13 DIAGNOSIS — R0683 Snoring: Secondary | ICD-10-CM | POA: Diagnosis present

## 2015-11-13 DIAGNOSIS — G4736 Sleep related hypoventilation in conditions classified elsewhere: Secondary | ICD-10-CM | POA: Insufficient documentation

## 2015-12-01 ENCOUNTER — Encounter (HOSPITAL_COMMUNITY): Payer: Self-pay

## 2015-12-01 ENCOUNTER — Emergency Department (HOSPITAL_COMMUNITY)
Admission: EM | Admit: 2015-12-01 | Discharge: 2015-12-01 | Disposition: A | Discharge status: Home / Self Care | Payer: BC Managed Care – PPO | Attending: Emergency Medicine | Admitting: Emergency Medicine

## 2015-12-01 DIAGNOSIS — Z79899 Other long term (current) drug therapy: Secondary | ICD-10-CM | POA: Diagnosis not present

## 2015-12-01 DIAGNOSIS — I10 Essential (primary) hypertension: Secondary | ICD-10-CM | POA: Insufficient documentation

## 2015-12-01 DIAGNOSIS — Z7984 Long term (current) use of oral hypoglycemic drugs: Secondary | ICD-10-CM | POA: Diagnosis not present

## 2015-12-01 DIAGNOSIS — E1165 Type 2 diabetes mellitus with hyperglycemia: Secondary | ICD-10-CM | POA: Insufficient documentation

## 2015-12-01 DIAGNOSIS — R739 Hyperglycemia, unspecified: Secondary | ICD-10-CM

## 2015-12-01 HISTORY — DX: Type 2 diabetes mellitus without complications: E11.9

## 2015-12-01 LAB — BASIC METABOLIC PANEL
Anion gap: 10 (ref 5–15)
BUN: 10 mg/dL (ref 6–20)
CO2: 26 mmol/L (ref 22–32)
Calcium: 9 mg/dL (ref 8.9–10.3)
Chloride: 95 mmol/L — ABNORMAL LOW (ref 101–111)
Creatinine, Ser: 0.8 mg/dL (ref 0.44–1.00)
GFR calc Af Amer: >60 mL/min (ref >60)
GFR calc non Af Amer: >60 mL/min (ref >60)
Glucose, Bld: 603 mg/dL (ref 65–99)
Potassium: 3.9 mmol/L (ref 3.5–5.1)
Sodium: 131 mmol/L — ABNORMAL LOW (ref 135–145)

## 2015-12-01 LAB — CBC
HCT: 44.4 % (ref 36.0–46.0)
Hemoglobin: 14.5 g/dL (ref 12.0–15.0)
MCH: 28.4 pg (ref 26.0–34.0)
MCHC: 32.7 g/dL (ref 30.0–36.0)
MCV: 87.1 fL (ref 78.0–100.0)
Platelets: 231 10*3/uL (ref 150–400)
RBC: 5.1 MIL/uL (ref 3.87–5.11)
RDW: 13.5 % (ref 11.5–15.5)
WBC: 6.9 10*3/uL (ref 4.0–10.5)

## 2015-12-01 LAB — URINALYSIS, ROUTINE W REFLEX MICROSCOPIC
Bilirubin Urine: NEGATIVE
Glucose, UA: >1000 mg/dL — AB
Hgb urine dipstick: NEGATIVE
Ketones, ur: NEGATIVE mg/dL
Leukocytes, UA: NEGATIVE
Nitrite: NEGATIVE
Protein, ur: NEGATIVE mg/dL
Specific Gravity, Urine: 1.035 — ABNORMAL HIGH (ref 1.005–1.030)
pH: 5.5 (ref 5.0–8.0)

## 2015-12-01 LAB — URINE MICROSCOPIC-ADD ON
Bacteria, UA: NONE SEEN
RBC / HPF: NONE SEEN RBC/hpf (ref 0–5)

## 2015-12-01 LAB — CBG MONITORING, ED
Glucose-Capillary: 205 mg/dL — ABNORMAL HIGH (ref 65–99)
Glucose-Capillary: 499 mg/dL — ABNORMAL HIGH (ref 65–99)

## 2015-12-01 MED ORDER — SODIUM CHLORIDE 0.9 % IV BOLUS (SEPSIS)
1000.0000 mL | Freq: Once | INTRAVENOUS | Status: AC
  Administered 2015-12-01: 1000 mL via INTRAVENOUS

## 2015-12-01 MED ORDER — INSULIN ASPART 100 UNIT/ML ~~LOC~~ SOLN
10.0000 [IU] | Freq: Once | SUBCUTANEOUS | Status: AC
  Administered 2015-12-01: 10 [IU] via INTRAVENOUS
  Filled 2015-12-01: qty 1

## 2015-12-01 NOTE — ED Provider Notes (Signed)
[facility] DEPT Provider Note   CSN: [id number] Arrival date & time: 12/01/15  0012  By signing my name below, I, [name], attest that this documentation has been prepared under the direction and in the presence of non-physician practitioner, Shary Decamp, PA-C. Electronically Signed: [name], Scribe. 12/01/2015. 12:43 AM.  History   Chief Complaint Chief Complaint  Patient presents with  . Hyperglycemia   The history is provided by the patient. No language interpreter was used.   HPI Comments: SARRAH Gomez is a 45 y.o. female with a hx of DM Type II who presents to the Emergency Department complaining of hyperglycemia. She reports having a blood sugar of 438. DM is usually controlled with glipizide. Reports poor diet for DM. Denies any hx of DKA, nausea, vomiting, diarrhea, abdominal pain, chest pain, SOB, or any other complaints. No pain currently. No URI symptoms. Notes compliance with glipizide regiment. No other symptoms noted.  Past Medical History:  Diagnosis Date  . Allergy    rhinitis  . Anemia   . Anxiety   . Diabetes mellitus without complication ([place])   . Enlarged heart   . Hypertension   . Morbid obesity ([place])   . Shortness of breath dyspnea    with low iron    Patient Active Problem List   Diagnosis Date Noted  . Major depressive disorder, single episode, moderate ([place]) 10/28/2015  . Microcytic hypochromic anemia 07/29/2015  . Type 2 diabetes mellitus ([place]) 06/24/2015  . Depression 06/24/2015  . Polyuria 06/17/2015  . Snoring 06/17/2015  . Anxiety and depression 04/24/2015  . Pre-op evaluation 01/09/2015  . Hyperlipidemia LDL goal < 100 01/06/2013  . Right lumbar radiculopathy 10/26/2012  . ANA positive 09/14/2012  . LVH (left ventricular hypertrophy) 12/08/2011  . Menorrhagia 09/24/2011  . Routine general medical examination at a health care facility 09/24/2011  . Carpal tunnel syndrome of right wrist 09/18/2010  . Hypokalemia  09/18/2010  . Morbid obesity ([place]) 11/29/2008  . Iron deficiency anemia 11/28/2008  . Essential hypertension 11/28/2008  . ALLERGIC RHINITIS 11/28/2008    Past Surgical History:  Procedure Laterality Date  . ABDOMINAL HYSTERECTOMY N/A 01/29/2015   Procedure: TOTAL ABDOMINAL HYSTERECTOMY ;  Surgeon: Ena Dawley, MD;  Location: [facility];  Service: Gynecology;  Laterality: N/A;  . BILATERAL SALPINGECTOMY Bilateral 01/29/2015   Procedure: BILATERAL SALPINGECTOMY;  Surgeon: Ena Dawley, MD;  Location: [facility];  Service: Gynecology;  Laterality: Bilateral;  . CESAREAN SECTION    . KNEE SURGERY  1992   ? arthroscopic/ right    OB History    Gravida Para Term Preterm AB Living   3 1     1 2    SAB TAB Ectopic Multiple Live Births   1               Home Medications    Prior to Admission medications   Medication Sig Start Date End Date Taking? Authorizing Provider  Azilsartan-Chlorthalidone (EDARBYCLOR) 40-25 MG TABS Take 1 tablet by mouth daily. 04/24/15   Golden Circle, FNP  Blood Glucose Monitoring Suppl (ONE TOUCH ULTRA MINI) w/Device KIT Use device to test blood sugars 1-4 times daily as instructed. 06/24/15   Golden Circle, FNP  bromocriptine (PARLODEL) 2.5 MG tablet 1/4 tab daily 11/07/15   Renato Shin, MD  Dulaglutide (TRULICITY) 1.5 WU/9.8JX SOPN Inject 1.5 mg into the skin once a week. 09/30/15   Golden Circle, FNP  fluconazole (DIFLUCAN) 150 MG tablet Take 1 tablet (150 mg  total) by mouth once. Patient not taking: Reported on 11/07/2015 09/30/15   Golden Circle, FNP  glipiZIDE (GLUCOTROL XL) 5 MG 24 hr tablet Take 1 tablet (5 mg total) by mouth daily with breakfast. 09/30/15   Golden Circle, FNP  glucose blood test strip Use as instructed 06/24/15   Golden Circle, FNP  potassium chloride SA (K-DUR,KLOR-CON) 20 MEQ tablet Take 1 tablet (20 mEq total) by mouth 2 (two) times daily. Patient not taking: Reported on 09/30/2015 06/27/14   Janith Lima, MD  terconazole  (TERAZOL 7) 0.4 % vaginal cream Place 1 applicator vaginally at bedtime. Patient not taking: Reported on 11/07/2015 10/17/15   Golden Circle, FNP  Vilazodone HCl 10 & 20 & 40 MG KIT Take 1 tablet by mouth daily per kit instructions. Patient not taking: Reported on 11/07/2015 09/30/15   Golden Circle, FNP    Family History Family History  Problem Relation Age of Onset  . Hypertension Mother   . Diabetes Mother   . Coronary artery disease Father   . Heart failure Father   . Heart attack Father   . Diabetes Father   . Hypertension Other   . Hyperlipidemia Other     Social History Social History  Substance Use Topics  . Smoking status: Never Smoker  . Smokeless tobacco: Never Used  . Alcohol use No    Allergies   Lisinopril   Review of Systems Review of Systems A complete 10 system review of systems was obtained and all systems are negative except as noted in the HPI and PMH.   Physical Exam Updated Vital Signs BP (!) 183/103 (BP Location: Left Arm)   Pulse 90   Temp 98.3 F (36.8 C) (Oral)   Resp 20   Ht 5' 2"  (1.575 m)   Wt 289 lb (131.1 kg)   LMP 12/18/2014 (Exact Date)   SpO2 97%   BMI 52.86 kg/m   Physical Exam  Constitutional: She is oriented to person, place, and time. Vital signs are normal. She appears well-developed and well-nourished. No distress.  HENT:  Head: Normocephalic and atraumatic.  Right Ear: Hearing normal.  Left Ear: Hearing normal.  Eyes: Conjunctivae and EOM are normal. Pupils are equal, round, and reactive to light.  Neck: Normal range of motion. Neck supple.  Cardiovascular: Normal rate, regular rhythm, normal heart sounds and intact distal pulses.   No murmur heard. Pulmonary/Chest: Effort normal and breath sounds normal. No respiratory distress. She has no wheezes. She has no rales.  Abdominal: Soft. Bowel sounds are normal. She exhibits no distension. There is no tenderness.  Musculoskeletal: Normal range of motion.    Neurological: She is alert and oriented to person, place, and time.  Skin: Skin is warm and dry.  Psychiatric: She has a normal mood and affect. Her speech is normal and behavior is normal. Thought content normal.  Nursing note and vitals reviewed.  ED Treatments / Results  DIAGNOSTIC STUDIES: Oxygen Saturation is 97% on RA, normal by my interpretation.    COORDINATION OF CARE: 12:47 AM- Pt advised of plan for treatment and pt agrees.  Labs (all labs ordered are listed, but only abnormal results are displayed) Labs Reviewed  BASIC METABOLIC PANEL - Abnormal; Notable for the following:       Result Value   Sodium 131 (*)    Chloride 95 (*)    Glucose, Bld 603 (*)    All other components within normal limits  URINALYSIS, ROUTINE  W REFLEX MICROSCOPIC (NOT AT [facility]) - Abnormal; Notable for the following:    Specific Gravity, Urine 1.035 (*)    Glucose, UA >1000 (*)    All other components within normal limits  URINE MICROSCOPIC-ADD ON - Abnormal; Notable for the following:    Squamous Epithelial / LPF 0-5 (*)    All other components within normal limits  CBG MONITORING, ED - Abnormal; Notable for the following:    Glucose-Capillary 499 (*)    All other components within normal limits  CBG MONITORING, ED - Abnormal; Notable for the following:    Glucose-Capillary 205 (*)    All other components within normal limits  CBC   EKG  EKG Interpretation None      Radiology No results found.  Procedures Procedures (including critical care time)  Medications Ordered in ED Medications  sodium chloride 0.9 % bolus 1,000 mL (1,000 mLs Intravenous New Bag/Given 12/01/15 0059)  sodium chloride 0.9 % bolus 1,000 mL (1,000 mLs Intravenous New Bag/Given 12/01/15 0144)  insulin aspart (novoLOG) injection 10 Units (10 Units Intravenous Given 12/01/15 0144)   Initial Impression / Assessment and Plan / ED Course  I have reviewed the triage vital signs and the nursing notes.  Pertinent  labs & imaging results that were available during my care of the patient were reviewed by me and considered in my medical decision making (see chart for details).  Clinical Course   Final Clinical Impressions(s) / ED Diagnoses  I have reviewed and evaluated the relevant laboratory values I have reviewed the relevant previous healthcare records. I obtained HPI from historian. Patient discussed with supervising physician  ED Course:  Assessment: Pt is a 77yF with hx DM2 who presents with hyperglycemia. On exam, pt in NAD. Nontoxic/nonseptic appearing. VSS. Afebrile. Lungs CTA. Heart RRR. Abdomen nontender soft. Pt well appearing and in NAD. No N/V. CBC unremarkable. POC glucose 499.  BMP showed No gap. Potassium WNL. UA with no ketones. No evidence of DKA. Given fluids and insulin in ED with improvement of hyperglycemia. Plan is to DC home with follow up to PCP. Likely needs additional DM medications from PCP. PT does endorse non compliance with PO diet for DM. At time of discharge, Patient is in no acute distress. Vital Signs are stable. Patient is able to ambulate. Patient able to tolerate PO.    Disposition/Plan:  DC Home Additional Verbal discharge instructions given and discussed with patient.  Pt Instructed to f/u with PCP in the next week for evaluation and treatment of symptoms. Return precautions given Pt acknowledges and agrees with plan  Supervising Physician April Palumbo, MD   Final diagnoses:  Hyperglycemia    New Prescriptions New Prescriptions   No medications on file   I personally performed the services described in this documentation, which was scribed in my presence. The recorded information has been reviewed and is accurate.     Shary Decamp, PA-C 12/01/15 9628    April Palumbo, MD 12/01/15 [phone]

## 2015-12-01 NOTE — ED Triage Notes (Signed)
States high blood sugar 438 at home states blood sugar is always high and supposed to go back to doctor in October for possible insulin shot.

## 2015-12-01 NOTE — Discharge Instructions (Signed)
Please read and follow all provided instructions.  Your diagnoses today include:  1. Hyperglycemia     Tests performed today include: Vital signs. See below for your results today.   Medications prescribed:  Take as prescribed   Home care instructions:  Follow any educational materials contained in this packet.  Follow-up instructions: Please follow-up with your primary care provider for further evaluation of symptoms and treatment   Return instructions:  Please return to the Emergency Department if you do not get better, if you get worse, or new symptoms OR  - Fever (temperature greater than 101.63F)  - Bleeding that does not stop with holding pressure to the area    -Severe pain (please note that you may be more sore the day after your accident)  - Chest Pain  - Difficulty breathing  - Severe nausea or vomiting  - Inability to tolerate food and liquids  - Passing out  - Skin becoming red around your wounds  - Change in mental status (confusion or lethargy)  - New numbness or weakness    Please return if you have any other emergent concerns.  Additional Information:  Your vital signs today were: BP 181/93    Pulse 78    Temp 98.3 F (36.8 C) (Oral)    Resp 20    Ht 5\' 2"  (1.575 m)    Wt 131.1 kg    LMP 12/18/2014 (Exact Date)    SpO2 98%    BMI 52.86 kg/m  If your blood pressure (BP) was elevated above 135/85 this visit, please have this repeated by your doctor within one month. ---------------

## 2015-12-02 LAB — I-STAT CHEM 8, ED
BUN: 10 mg/dL (ref 6–20)
Calcium, Ion: 1.07 mmol/L — ABNORMAL LOW (ref 1.15–1.40)
Chloride: 92 mmol/L — ABNORMAL LOW (ref 101–111)
Creatinine, Ser: 0.6 mg/dL (ref 0.44–1.00)
Glucose, Bld: 578 mg/dL (ref 65–99)
HCT: 48 % — ABNORMAL HIGH (ref 36.0–46.0)
Hemoglobin: 16.3 g/dL — ABNORMAL HIGH (ref 12.0–15.0)
Potassium: 3.6 mmol/L (ref 3.5–5.1)
Sodium: 133 mmol/L — ABNORMAL LOW (ref 135–145)
TCO2: 30 mmol/L (ref 0–100)

## 2015-12-03 ENCOUNTER — Ambulatory Visit (HOSPITAL_COMMUNITY): Payer: Self-pay | Admitting: Licensed Clinical Social Worker

## 2015-12-03 ENCOUNTER — Telehealth: Payer: Self-pay | Admitting: Family

## 2015-12-03 DIAGNOSIS — R0683 Snoring: Secondary | ICD-10-CM

## 2015-12-03 NOTE — Procedures (Signed)
Patient Name: Lindsay Gomez, Lindsay Gomez Date: 11/13/2015 Gender: Female D.O.B: [birth year] Age (years): 45 Referring Provider: Golden Circle Height (inches): 50 Interpreting Physician: Baird Lyons MD, ABSM Weight (lbs): 289 RPSGT: Gerhard Perches BMI: 53 MRN: [id number] Neck Size: 16.00 CLINICAL INFORMATION Sleep Study Type: NPSG Indication for sleep study: Obesity, Snoring Epworth Sleepiness Score: 13  SLEEP STUDY TECHNIQUE As per the AASM Manual for the Scoring of Sleep and Associated Events v2.3 (April 2016) with a hypopnea requiring 4% desaturations. The channels recorded and monitored were frontal, central and occipital EEG, electrooculogram (EOG), submentalis EMG (chin), nasal and oral airflow, thoracic and abdominal wall motion, anterior tibialis EMG, snore microphone, electrocardiogram, and pulse oximetry.  MEDICATIONS Patient's medications include: charted for review Medications self-administered by patient during sleep study : Glipizide, Edarbyclor, Chaga, Techui.  SLEEP ARCHITECTURE The study was initiated at 10:11:08 PM and ended at 4:34:23 AM. Sleep onset time was 18.6 minutes and the sleep efficiency was 83.9%. The total sleep time was 321.6 minutes. Stage REM latency was 100.5 minutes. The patient spent 11.35% of the night in stage N1 sleep, 70.62% in stage N2 sleep, 0.00% in stage N3 and 18.03% in REM. Alpha intrusion was absent. Supine sleep was 13.83%.  RESPIRATORY PARAMETERS The overall apnea/hypopnea index (AHI) was 12.1 per hour. There were 10 total apneas, including 10 obstructive, 0 central and 0 mixed apneas. There were 55 hypopneas and 14 RERAs. The AHI during Stage REM sleep was 16.6 per hour. AHI while supine was 31.0 per hour. The mean oxygen saturation was 93.40%. The minimum SpO2 during sleep was 84.00%. Moderate snoring was noted during this study.  CARDIAC DATA The 2 lead EKG demonstrated sinus rhythm. The mean heart rate was 86.35 beats per  minute. Other EKG findings include: None.  LEG MOVEMENT DATA The total PLMS were 0 with a resulting PLMS index of 0.00. Associated arousal with leg movement index was 0.0 . IMPRESSIONS - Mild obstructive sleep apnea occurred during this study (AHI = 12.1/h). - No significant central sleep apnea occurred during this study (CAI = 0.0/h). - Mild oxygen desaturation was noted during this study (Min O2 = 84.00%). - The patient snored with Moderate snoring volume. - No cardiac abnormalities were noted during this study. - Clinically significant periodic limb movements did not occur during sleep. No significant associated arousals.  DIAGNOSIS - Obstructive Sleep Apnea (327.23 [G47.33 ICD-10]) - Nocturnal Hypoxemia (327.26 [G47.36 ICD-10])  RECOMMENDATIONS - Therapeutic CPAP titration to determine optimal pressure required to alleviate sleep disordered breathing. - Positional therapy avoiding supine position during sleep. - Avoid alcohol, sedatives and other CNS depressants that may worsen sleep apnea and disrupt normal sleep architecture. - Sleep hygiene should be reviewed to assess factors that may improve sleep quality. - Weight management and regular exercise should be initiated or continued if appropriate.  [Electronically signed] 12/03/2015 01:28 PM  Baird Lyons MD, [place], American Board of Sleep Medicine   NPI:  [id number]     [place], American Board of Sleep Medicine  ELECTRONICALLY SIGNED ON:  12/03/2015, 1:24 PM [place] PH: [phone]   FX: [phone] [place]

## 2015-12-03 NOTE — Telephone Encounter (Signed)
Patient Name: Lindsay Gomez DOB: 15-Oct-1970 Initial Comment BS has been up- 369 Nurse Assessment Guidelines Guideline Title Affirmed Question Affirmed Notes Final Disposition User FINAL ATTEMPT MADE - no message left Ronnald Ramp, RN, [place] Comments message that mail box is full and unable to leave a message.

## 2015-12-06 ENCOUNTER — Other Ambulatory Visit: Payer: Self-pay | Admitting: Family

## 2015-12-06 DIAGNOSIS — I10 Essential (primary) hypertension: Secondary | ICD-10-CM

## 2015-12-10 ENCOUNTER — Encounter: Payer: Self-pay | Admitting: Family

## 2015-12-10 ENCOUNTER — Ambulatory Visit (INDEPENDENT_AMBULATORY_CARE_PROVIDER_SITE_OTHER): Payer: BC Managed Care – PPO | Admitting: Family

## 2015-12-10 DIAGNOSIS — E119 Type 2 diabetes mellitus without complications: Secondary | ICD-10-CM

## 2015-12-10 DIAGNOSIS — Z794 Long term (current) use of insulin: Secondary | ICD-10-CM | POA: Diagnosis not present

## 2015-12-10 MED ORDER — FLUCONAZOLE 150 MG PO TABS: 150.0000 mg | ORAL_TABLET | Freq: Once | ORAL | 0 refills | Status: AC

## 2015-12-10 NOTE — Assessment & Plan Note (Addendum)
Type 2 diabetes remains uncontrolled with average home blood sugars greater than 280 and most recent A1c of 12.0. Does endorse symptoms of end organ damage related to elevated blood sugars including neuropathy, blurred vision, and urinary frequency. Start fluconazole as needed for use infection. Continue current dosage of glipizide and Trulicity. Follow-up with endocrinology as scheduled. Encouraged decreasing nutritional intake and continued work with diabetes education as needed. Patient appears willing to start insulin if no improvements with bromocriptine.

## 2015-12-10 NOTE — Patient Instructions (Addendum)
Thank you for choosing [facility].  SUMMARY AND INSTRUCTIONS:  Medication:  Please start the bromocriptine.  Continue to the Trulicity and glipizide.   Follow up with Dr. Loanne Drilling next week.   Your prescription(s) have been submitted to your pharmacy or been printed and provided for you. Please take as directed and contact our office if you believe you are having problem(s) with the medication(s) or have any questions.  Follow up:  If your symptoms worsen or fail to improve, please contact our office for further instruction, or in case of emergency go directly to the emergency room at the closest medical facility.

## 2015-12-10 NOTE — Progress Notes (Signed)
Subjective:    Patient ID: Lindsay Gomez, female    DOB: 03-08-1971, 45 y.o.   MRN: [id number]  Chief Complaint  Patient presents with  . Hospitalization Follow-up    requesting diflucan for yeast infections due to sugars, sugars are still elevated    HPI:  Lindsay Gomez is a 45 y.o. female who  has a past medical history of Allergy; Anemia; Anxiety; Diabetes mellitus without complication ([place]); Enlarged heart; Hypertension; Morbid obesity ([place]); and Shortness of breath dyspnea. and presents today for a follow up office visit.  1.) Type 2 diabetes - Recently evaluated in the emergency department for hyperglycemia and noted to have a blood sugar at home of 438. She endorsed at the time poor diet. She was found to be nontoxic/nonseptic with no anion Gap and no urinary ketones. Felt to have no evidence of DKA and treated with 10 units of insulin and 2 L of fluids. Blood sugars were noted to be decreased with recommendation of follow-up with primary care/endocrinology.  She continues to take the glipizide and Trulicity as prescribed and denies adverse side effects or hypoglycemic readings. Blood sugars at home have been greater than 280 on average. She continues to experience difficulty with nutritional management. Recently evaluated by endocrinology with recommendation for insulin which was declined by patient and started on bromocriptine. She has yet to start taking the bromocriptine and has follow-up with endocrinology in one week. Currently experiencing the symptoms of end organ damage including neuropathy, blurred vision, and excessive urination. She also believes that she has a yeast infection secondary to her high blood sugars.   Lab Results  Component Value Date   HGBA1C 12.0 (H) 09/30/2015     Allergies  Allergen Reactions  . Lisinopril Other (See Comments)    Cough       Outpatient Medications Prior to Visit  Medication Sig Dispense Refill  . Blood Glucose  Monitoring Suppl (ONE TOUCH ULTRA MINI) w/Device KIT Use device to test blood sugars 1-4 times daily as instructed. 1 each 0  . bromocriptine (PARLODEL) 2.5 MG tablet 1/4 tab daily 8 tablet 11  . Dulaglutide (TRULICITY) 1.5 TI/1.4ER SOPN Inject 1.5 mg into the skin once a week. 4 pen 2  . EDARBYCLOR 40-25 MG TABS TAKE 1 TABLET ONCE DAILY. 30 tablet 0  . glipiZIDE (GLUCOTROL XL) 5 MG 24 hr tablet Take 1 tablet (5 mg total) by mouth daily with breakfast. 30 tablet 2  . glucose blood test strip Use as instructed 100 each 12  . NON FORMULARY Take 1 capsule by mouth daily. Chaga Supplement    . OVER THE COUNTER MEDICATION Take 1 capsule by mouth daily. Techui Supplement    . potassium chloride SA (K-DUR,KLOR-CON) 20 MEQ tablet Take 1 tablet (20 mEq total) by mouth 2 (two) times daily. 180 tablet 1  . terconazole (TERAZOL 7) 0.4 % vaginal cream Place 1 applicator vaginally at bedtime. 45 g 0  . fluconazole (DIFLUCAN) 150 MG tablet Take 1 tablet (150 mg total) by mouth once. 1 tablet 0  . Vilazodone HCl 10 & 20 & 40 MG KIT Take 1 tablet by mouth daily per kit instructions. 1 kit 0   No facility-administered medications prior to visit.     Review of Systems  Eyes:       Denies changes in vision  Respiratory: Negative for chest tightness and shortness of breath.   Cardiovascular: Negative for chest pain, palpitations and leg swelling.  Endocrine: Negative for  polydipsia, polyphagia and polyuria.  Neurological: Negative for dizziness, weakness and numbness.      Objective:    BP (!) 124/96 (BP Location: Left Arm, Patient Position: Sitting, Cuff Size: Large)   Pulse 94   Temp 98.9 F (37.2 C) (Oral)   Resp 18   Ht _0  (1.575 m)   Wt 292 lb (132.5 kg)   LMP 12/18/2014 (Exact Date)   SpO2 99%   BMI 53.41 kg/m  Nursing note and vital signs reviewed.   Physical Exam  Constitutional: She is oriented to person, place, and time. She appears well-developed and well-nourished. No distress.    Cardiovascular: Normal rate, regular rhythm, normal heart sounds and intact distal pulses.   Pulmonary/Chest: Effort normal and breath sounds normal.  Neurological: She is alert and oriented to person, place, and time.  Skin: Skin is warm and dry.  Psychiatric: She has a normal mood and affect. Her behavior is normal. Judgment and thought content normal.       Assessment & Plan:   Problem List Items Addressed This Visit      Endocrine   Type 2 diabetes mellitus ([place])    Type 2 diabetes remains uncontrolled with average home blood sugars greater than 280 and most recent A1c of 12.0. Does endorse symptoms of end organ damage related to elevated blood sugars including neuropathy, blurred vision, and urinary frequency. Start fluconazole as needed for use infection. Continue current dosage of glipizide and Trulicity. Follow-up with endocrinology as scheduled. Encouraged decreasing nutritional intake and continued work with diabetes education as needed. Patient appears willing to start insulin if no improvements with bromocriptine.       Other Visit Diagnoses   None.      I have discontinued [name]'s Vilazodone HCl and fluconazole. I am also having her start on fluconazole. Additionally, I am having her maintain her potassium chloride SA, ONE TOUCH ULTRA MINI, glucose blood, glipiZIDE, Dulaglutide, terconazole, bromocriptine, OVER THE COUNTER MEDICATION, NON FORMULARY, and EDARBYCLOR.   Meds ordered this encounter  Medications  . fluconazole (DIFLUCAN) 150 MG tablet    Sig: Take 1 tablet (150 mg total) by mouth once. Repeat in 72 hours if needed.    Dispense:  2 tablet    Refill:  0    Order Specific Question:   Supervising Provider    Answer:   Pricilla Holm A [[id number]]     Follow-up: Return if symptoms worsen or fail to improve.  Mauricio Po, FNP

## 2015-12-19 ENCOUNTER — Ambulatory Visit: Payer: Self-pay | Admitting: Endocrinology

## 2015-12-23 ENCOUNTER — Ambulatory Visit (HOSPITAL_COMMUNITY): Payer: Self-pay | Admitting: Licensed Clinical Social Worker

## 2015-12-25 ENCOUNTER — Ambulatory Visit: Payer: Self-pay | Admitting: Endocrinology

## 2015-12-27 ENCOUNTER — Other Ambulatory Visit: Payer: Self-pay

## 2015-12-27 MED ORDER — LANCETS MISC: 5 refills | Status: DC

## 2016-01-02 ENCOUNTER — Ambulatory Visit: Payer: Self-pay | Admitting: Endocrinology

## 2016-01-02 DIAGNOSIS — Z0289 Encounter for other administrative examinations: Secondary | ICD-10-CM

## 2016-01-21 ENCOUNTER — Other Ambulatory Visit: Payer: Self-pay | Admitting: Family

## 2016-01-21 DIAGNOSIS — I10 Essential (primary) hypertension: Secondary | ICD-10-CM

## 2016-01-29 ENCOUNTER — Ambulatory Visit (HOSPITAL_COMMUNITY): Payer: Self-pay | Admitting: Psychiatry

## 2016-03-20 LAB — HM DIABETES EYE EXAM

## 2016-03-26 ENCOUNTER — Other Ambulatory Visit: Payer: Self-pay | Admitting: Family

## 2016-03-31 ENCOUNTER — Other Ambulatory Visit: Payer: Self-pay | Admitting: Family

## 2016-03-31 MED ORDER — FLUCONAZOLE 150 MG PO TABS: ORAL_TABLET | ORAL | 1 refills | Status: DC

## 2016-04-03 ENCOUNTER — Encounter: Payer: Self-pay | Admitting: Family

## 2016-04-13 ENCOUNTER — Other Ambulatory Visit: Payer: Self-pay | Admitting: Family

## 2016-04-13 DIAGNOSIS — I10 Essential (primary) hypertension: Secondary | ICD-10-CM

## 2016-04-18 ENCOUNTER — Other Ambulatory Visit: Payer: Self-pay | Admitting: Family

## 2016-04-30 ENCOUNTER — Ambulatory Visit (INDEPENDENT_AMBULATORY_CARE_PROVIDER_SITE_OTHER): Payer: BC Managed Care – PPO | Admitting: Family

## 2016-04-30 ENCOUNTER — Encounter: Payer: Self-pay | Admitting: Family

## 2016-04-30 VITALS — BP 122/90 | HR 105 | Temp 98.6°F | Resp 18 | Ht 62.0 in | Wt 289.0 lb

## 2016-04-30 DIAGNOSIS — E1165 Type 2 diabetes mellitus with hyperglycemia: Secondary | ICD-10-CM

## 2016-04-30 LAB — POCT GLYCOSYLATED HEMOGLOBIN (HGB A1C): Hemoglobin A1C: 14.1

## 2016-04-30 MED ORDER — FLUCONAZOLE 150 MG PO TABS: ORAL_TABLET | ORAL | 1 refills | Status: DC

## 2016-04-30 MED ORDER — BASAGLAR KWIKPEN 100 UNIT/ML ~~LOC~~ SOPN: 20.0000 [IU] | PEN_INJECTOR | Freq: Every day | SUBCUTANEOUS | 0 refills | Status: DC

## 2016-04-30 NOTE — Progress Notes (Signed)
Subjective:    Patient ID: Lindsay Gomez, female    DOB: 1970/05/15, 46 y.o.   MRN: [id number]  Chief Complaint  Patient presents with  . Follow-up    diabetes, sugars are high, sugars are in the 400s    HPI:  Lindsay Gomez is a 46 y.o. female who  has a past medical history of Allergy; Anemia; Anxiety; Diabetes mellitus without complication ([place]); Enlarged heart; Hypertension; Morbid obesity ([place]); and Shortness of breath dyspnea. and presents today for a follow up office visit.  Type 2 diabetes - Currently maintained on Trulicity and glipizide. Reports taking the medication as prescribed and denies adverse side effects. Notes that her sugars have been high with readings in the 400s. Reports that she has had some good days and some worse days. Describes excessive hunger, thirst and urination. A concern is that there is limited choices at work and brings her lunch and snacks on occasion.  Does have excessive fatigue. Has completed her eye exam.  Lab Results  Component Value Date   HGBA1C 14.1 04/30/2016     Allergies  Allergen Reactions  . Lisinopril Other (See Comments)    Cough       Outpatient Medications Prior to Visit  Medication Sig Dispense Refill  . Blood Glucose Monitoring Suppl (ONE TOUCH ULTRA MINI) w/Device KIT Use device to test blood sugars 1-4 times daily as instructed. 1 each 0  . bromocriptine (PARLODEL) 2.5 MG tablet 1/4 tab daily 8 tablet 11  . EDARBYCLOR 40-25 MG TABS TAKE 1 TABLET ONCE DAILY. 30 tablet 0  . GLIPIZIDE XL 5 MG 24 hr tablet TAKE 1 TABLET WITH BREAKFAST. 30 tablet 4  . glucose blood test strip Use as instructed 100 each 12  . Lancets MISC Use one lancet per test to check blood sugar 1-4 times daily. Dx code:E11.9 100 each 5  . NON FORMULARY Take 1 capsule by mouth daily. Chaga Supplement    . OVER THE COUNTER MEDICATION Take 1 capsule by mouth daily. Techui Supplement    . potassium chloride SA (K-DUR,KLOR-CON) 20 MEQ tablet Take 1  tablet (20 mEq total) by mouth 2 (two) times daily. 180 tablet 1  . terconazole (TERAZOL 7) 0.4 % vaginal cream Place 1 applicator vaginally at bedtime. 45 g 0  . TRULICITY 1.5 OV/2.9VB SOPN INJECT 0.5ML SUBCUTANEOUSLY ONCE A WEEK AS DIRECTED. 2 mL 0  . fluconazole (DIFLUCAN) 150 MG tablet Take 1 tablet by mouth once and may repeat in 72 hours as needed. 2 tablet 1   No facility-administered medications prior to visit.     Review of Systems  Constitutional: Positive for fatigue. Negative for chills and fever.  Eyes:       Denies changes in vision  Respiratory: Negative for chest tightness and shortness of breath.   Cardiovascular: Negative for chest pain, palpitations and leg swelling.  Endocrine: Positive for polydipsia, polyphagia and polyuria.  Neurological: Negative for dizziness, weakness and numbness.      Objective:    BP 122/90 (BP Location: Left Arm, Patient Position: Sitting, Cuff Size: Large)   Pulse (!) 105   Temp 98.6 F (37 C) (Oral)   Resp 18   Ht 5' 2"  (1.575 m)   Wt 289 lb (131.1 kg)   LMP 12/18/2014 (Exact Date)   SpO2 95%   BMI 52.86 kg/m  Nursing note and vital signs reviewed.  Physical Exam  Constitutional: She is oriented to person, place, and time. She appears well-developed  and well-nourished. No distress.  Laying on the table throughout the exam.   Cardiovascular: Normal rate, regular rhythm, normal heart sounds and intact distal pulses.   Pulmonary/Chest: Effort normal and breath sounds normal.  Neurological: She is alert and oriented to person, place, and time.  Skin: Skin is warm and dry.  Psychiatric: She has a normal mood and affect. Her behavior is normal. Judgment and thought content normal.       Assessment & Plan:   Problem List Items Addressed This Visit      Endocrine   Type 2 diabetes mellitus ([place]) - Primary    Type 2 diabetes with poor control and in office A1c of 14.1. Continued elevating A1c most likely related to poor patient  diet as evidenced by her recent drink selection of full sugar sodas. Emphasize importance of decreasing carbohydrates and processed/sugary foods and diet to help reduce her blood sugars. In office glucose measurement too high for meter to read. Continue current dosage of Trulicity and Glipizide. Start Engineer, agricultural. Proper medication administration with demonstration provided. Patient able to return demonstration with self injection of medication without complication. She will follow-up in 4 days with blood sugar readings for titration of insulin. She will also work on decreasing carbohydrates and processed/sugary foods in her current nutritional intake. Consider a secondary appointment with diabetic education. Continue to monitor.      Relevant Medications   Insulin Glargine (BASAGLAR KWIKPEN) 100 UNIT/ML SOPN   Other Relevant Orders   POCT HgB A1C (Completed)       I am having Ms. Henrene Pastor start on [facility]. I am also having her maintain her potassium chloride SA, ONE TOUCH ULTRA MINI, glucose blood, terconazole, bromocriptine, OVER THE COUNTER MEDICATION, NON FORMULARY, Lancets, GLIPIZIDE XL, EDARBYCLOR, TRULICITY, and fluconazole.   Meds ordered this encounter  Medications  . fluconazole (DIFLUCAN) 150 MG tablet    Sig: Take 1 tablet by mouth once and may repeat in 72 hours as needed.    Dispense:  2 tablet    Refill:  1    Order Specific Question:   Supervising Provider    Answer:   Pricilla Holm A [[id number]]  . Insulin Glargine (BASAGLAR KWIKPEN) 100 UNIT/ML SOPN    Sig: Inject 0.2 mLs (20 Units total) into the skin at bedtime.    Dispense:  1 pen    Refill:  0    Order Specific Question:   Supervising Provider    Answer:   Pricilla Holm A [[id number]]   A total of 25 minutes was spent face-to-face with the patient during this encounter and over half of that time was spent on counseling and coordination of care.  We discussed in depth the importance of changing nutritional  intake, complications of diabetes, and medication administration and techniques.   Follow-up: Return in about 1 week (around 05/07/2016), or if symptoms worsen or fail to improve.  Mauricio Po, FNP

## 2016-04-30 NOTE — Assessment & Plan Note (Signed)
Type 2 diabetes with poor control and in office A1c of 14.1. Continued elevating A1c most likely related to poor patient diet as evidenced by her recent drink selection of full sugar sodas. Emphasize importance of decreasing carbohydrates and processed/sugary foods and diet to help reduce her blood sugars. In office glucose measurement too high for meter to read. Continue current dosage of Trulicity and Glipizide. Start Engineer, agricultural. Proper medication administration with demonstration provided. Patient able to return demonstration with self injection of medication without complication. She will follow-up in 4 days with blood sugar readings for titration of insulin. She will also work on decreasing carbohydrates and processed/sugary foods in her current nutritional intake. Consider a secondary appointment with diabetic education. Continue to monitor.

## 2016-04-30 NOTE — Patient Instructions (Addendum)
Thank you for choosing [facility].  SUMMARY AND INSTRUCTIONS:  Please check on Tresiba, Lantus, Basaglar  Start the 20 units of Basaglar night.   Call in blood sugars on Monday.  Medication:  Your prescription(s) have been submitted to your pharmacy or been printed and provided for you. Please take as directed and contact our office if you believe you are having problem(s) with the medication(s) or have any questions.   Follow up:  If your symptoms worsen or fail to improve, please contact our office for further instruction, or in case of emergency go directly to the emergency room at the closest medical facility.

## 2016-05-07 ENCOUNTER — Telehealth: Payer: Self-pay | Admitting: Emergency Medicine

## 2016-05-07 NOTE — Telephone Encounter (Signed)
Pt called and her blood sugars are still high. She also called her insurance company and they do cover the insulin so she is wondering if you can call her in a prescription for it. Thanks.

## 2016-05-07 NOTE — Telephone Encounter (Signed)
Please check with patient what her blood sugars are so that I may gauge a dosage adjustment.

## 2016-05-08 ENCOUNTER — Other Ambulatory Visit: Payer: Self-pay

## 2016-05-08 DIAGNOSIS — E1165 Type 2 diabetes mellitus with hyperglycemia: Secondary | ICD-10-CM

## 2016-05-08 MED ORDER — BASAGLAR KWIKPEN 100 UNIT/ML ~~LOC~~ SOPN: 30.0000 [IU] | PEN_INJECTOR | Freq: Every day | SUBCUTANEOUS | 0 refills | Status: DC

## 2016-05-08 NOTE — Telephone Encounter (Signed)
LVM for pt to call back giving readings of her sugars.

## 2016-06-22 ENCOUNTER — Other Ambulatory Visit: Payer: Self-pay | Admitting: Family

## 2016-06-22 DIAGNOSIS — I10 Essential (primary) hypertension: Secondary | ICD-10-CM

## 2016-07-30 ENCOUNTER — Ambulatory Visit (INDEPENDENT_AMBULATORY_CARE_PROVIDER_SITE_OTHER): Payer: BC Managed Care – PPO | Admitting: Family

## 2016-07-30 ENCOUNTER — Encounter: Payer: Self-pay | Admitting: Family

## 2016-07-30 VITALS — BP 134/88 | HR 95 | Temp 98.4°F | Resp 16 | Ht 62.0 in | Wt 292.0 lb

## 2016-07-30 DIAGNOSIS — E1165 Type 2 diabetes mellitus with hyperglycemia: Secondary | ICD-10-CM | POA: Diagnosis not present

## 2016-07-30 DIAGNOSIS — G4733 Obstructive sleep apnea (adult) (pediatric): Secondary | ICD-10-CM | POA: Diagnosis not present

## 2016-07-30 LAB — POCT GLYCOSYLATED HEMOGLOBIN (HGB A1C): Hemoglobin A1C: 12.4

## 2016-07-30 MED ORDER — BROMOCRIPTINE MESYLATE 2.5 MG PO TABS: ORAL_TABLET | ORAL | 2 refills | Status: DC

## 2016-07-30 NOTE — Patient Instructions (Signed)
Thank you for choosing [facility].  SUMMARY AND INSTRUCTIONS:  Please increase Basaglar to 40 units nightly.  Continue glipizide and Trulicity.  They will call to schedule an appointment with sleep medicine.  Follow up for 3 months or sooner.  Medication:  Your prescription(s) have been submitted to your pharmacy or been printed and provided for you. Please take as directed and contact our office if you believe you are having problem(s) with the medication(s) or have any questions.  Follow up:  If your symptoms worsen or fail to improve, please contact our office for further instruction, or in case of emergency go directly to the emergency room at the closest medical facility.

## 2016-07-30 NOTE — Progress Notes (Signed)
Subjective:    Patient ID: Lindsay Gomez, female    DOB: 1970-09-28, 46 y.o.   MRN: [id number]  Chief Complaint  Patient presents with  . Follow-up    states that she does not see any improvement in diabetes or sugar readings, wants to be given whatever medication she was given as a sample last time that does not have to be refrigorated    HPI:  Lindsay Gomez is a 46 y.o. female who  has a past medical history of Allergy; Anemia; Anxiety; Diabetes mellitus without complication ([place]); Enlarged heart; Hypertension; Morbid obesity ([place]); and Shortness of breath dyspnea. and presents today for an office follow up.   Diabetes - Currently prescribed Basaglar (30 units), Glipidizide and Trulicity. Reports taking the medication as prescribed and denies adverse side effects or hypoglycemic readings. Blood sugars at home have been between 250-400. Reports that she has been inconsistent with eating and has not worked out recently. Has cut out sodas. Continues to eat fast food.  Denies new symptoms of end organ damage. No excessive hunger, thirst, or urination. Working on a low/carbohydrate modified oral intake. Does feel better than previous with increased levels of energy.   Lab Results  Component Value Date   HGBA1C 12.4 07/30/2016     Lab Results  Component Value Date   CREATININE 0.60 12/01/2015   BUN 10 12/01/2015   NA 133 (L) 12/01/2015   K 3.6 12/01/2015   CL 92 (L) 12/01/2015   CO2 26 12/01/2015   2.) Sleep apnea - Not currently maintained on CPAP. Requesting referral to sleep medicine to obtain CPAP and appropriate titration.    Allergies  Allergen Reactions  . Lisinopril Other (See Comments)    Cough       Outpatient Medications Prior to Visit  Medication Sig Dispense Refill  . Blood Glucose Monitoring Suppl (ONE TOUCH ULTRA MINI) w/Device KIT Use device to test blood sugars 1-4 times daily as instructed. 1 each 0  . EDARBYCLOR 40-25 MG TABS TAKE 1 TABLET ONCE  DAILY. 30 tablet 0  . GLIPIZIDE XL 5 MG 24 hr tablet TAKE 1 TABLET WITH BREAKFAST. 30 tablet 4  . glucose blood test strip Use as instructed 100 each 12  . Insulin Glargine (BASAGLAR KWIKPEN) 100 UNIT/ML SOPN Inject 0.2 mLs (20 Units total) into the skin at bedtime. 1 pen 0  . Insulin Glargine (BASAGLAR KWIKPEN) 100 UNIT/ML SOPN Inject 0.3 mLs (30 Units total) into the skin at bedtime. 3 pen 0  . Lancets MISC Use one lancet per test to check blood sugar 1-4 times daily. Dx code:E11.9 100 each 5  . NON FORMULARY Take 1 capsule by mouth daily. Chaga Supplement    . OVER THE COUNTER MEDICATION Take 1 capsule by mouth daily. Techui Supplement    . potassium chloride SA (K-DUR,KLOR-CON) 20 MEQ tablet Take 1 tablet (20 mEq total) by mouth 2 (two) times daily. 180 tablet 1  . terconazole (TERAZOL 7) 0.4 % vaginal cream Place 1 applicator vaginally at bedtime. 45 g 0  . TRULICITY 1.5 SW/1.0XN SOPN INJECT 0.5ML SUBCUTANEOUSLY ONCE A WEEK AS DIRECTED. 2 mL 0  . bromocriptine (PARLODEL) 2.5 MG tablet 1/4 tab daily 8 tablet 11  . fluconazole (DIFLUCAN) 150 MG tablet Take 1 tablet by mouth once and may repeat in 72 hours as needed. 2 tablet 1   No facility-administered medications prior to visit.       Past Surgical History:  Procedure Laterality Date  .  ABDOMINAL HYSTERECTOMY N/A 01/29/2015   Procedure: TOTAL ABDOMINAL HYSTERECTOMY ;  Surgeon: Ena Dawley, MD;  Location: [facility];  Service: Gynecology;  Laterality: N/A;  . BILATERAL SALPINGECTOMY Bilateral 01/29/2015   Procedure: BILATERAL SALPINGECTOMY;  Surgeon: Ena Dawley, MD;  Location: [facility];  Service: Gynecology;  Laterality: Bilateral;  . CESAREAN SECTION    . KNEE SURGERY  1992   ? arthroscopic/ right      Past Medical History:  Diagnosis Date  . Allergy    rhinitis  . Anemia   . Anxiety   . Diabetes mellitus without complication ([place])   . Enlarged heart   . Hypertension   . Morbid obesity ([place])   . Shortness of breath  dyspnea    with low iron    Review of Systems  Constitutional: Negative for chills, fatigue and fever.  Eyes:       Denies changes in vision  Respiratory: Negative for chest tightness and shortness of breath.   Cardiovascular: Negative for chest pain, palpitations and leg swelling.  Endocrine: Negative for polydipsia, polyphagia and polyuria.  Neurological: Negative for numbness.      Objective:    BP 134/88 (BP Location: Left Arm, Patient Position: Sitting, Cuff Size: Large)   Pulse 95   Temp 98.4 F (36.9 C) (Oral)   Resp 16   Ht 5' 2"  (1.575 m)   Wt 292 lb (132.5 kg)   LMP 12/18/2014 (Exact Date)   SpO2 96%   BMI 53.41 kg/m  Nursing note and vital signs reviewed.  Physical Exam  Constitutional: She is oriented to person, place, and time. She appears well-developed and well-nourished. No distress.  Cardiovascular: Normal rate, regular rhythm, normal heart sounds and intact distal pulses.   Pulmonary/Chest: Effort normal and breath sounds normal.  Neurological: She is alert and oriented to person, place, and time.  Skin: Skin is warm and dry.  Psychiatric: She has a normal mood and affect. Her behavior is normal. Judgment and thought content normal.       Assessment & Plan:   Problem List Items Addressed This Visit      Respiratory   Obstructive sleep apnea    Refer to sleep medicine to establish CPAP and for appropriate titration.       Relevant Orders   Ambulatory referral to Pulmonology     Endocrine   Type 2 diabetes mellitus ([place]) - Primary    In office A1c improved to 12.4 from 14.1. Increase Lantus to 40 units nightly. Restart bromocriptine. Continue current dosage of Trulicity and Glipizide. Continue to monitor blood sugars at home. There is question regarding compliance secondary to refill profile.  Encouraged continued lifestyle changes to reduce carbohydrate intake. Follow up in 3 months of sooner.       Relevant Orders   POCT HgB A1C (Completed)         I have discontinued Ms. Kunde's fluconazole. I have also changed her bromocriptine. Additionally, I am having her maintain her potassium chloride SA, ONE TOUCH ULTRA MINI, glucose blood, terconazole, OVER THE COUNTER MEDICATION, NON FORMULARY, Lancets, GLIPIZIDE XL, BASAGLAR KWIKPEN, BASAGLAR KWIKPEN, EDARBYCLOR, and TRULICITY.   Meds ordered this encounter  Medications  . bromocriptine (PARLODEL) 2.5 MG tablet    Sig: Take 1/4 tablet by mouth daily.    Dispense:  8 tablet    Refill:  2    Order Specific Question:   Supervising Provider    Answer:   Pricilla Holm A [[id number]]  Follow-up: Return in about 3 months (around 10/30/2016), or if symptoms worsen or fail to improve.  Mauricio Po, FNP

## 2016-07-30 NOTE — Assessment & Plan Note (Signed)
In office A1c improved to 12.4 from 14.1. Increase Lantus to 40 units nightly. Restart bromocriptine. Continue current dosage of Trulicity and Glipizide. Continue to monitor blood sugars at home. There is question regarding compliance secondary to refill profile.  Encouraged continued lifestyle changes to reduce carbohydrate intake. Follow up in 3 months of sooner.

## 2016-07-30 NOTE — Assessment & Plan Note (Signed)
Refer to sleep medicine to establish CPAP and for appropriate titration.

## 2016-08-26 ENCOUNTER — Other Ambulatory Visit: Payer: Self-pay | Admitting: Family

## 2016-08-26 DIAGNOSIS — I10 Essential (primary) hypertension: Secondary | ICD-10-CM

## 2016-08-28 ENCOUNTER — Other Ambulatory Visit: Payer: Self-pay | Admitting: Family

## 2016-09-01 ENCOUNTER — Other Ambulatory Visit: Payer: Self-pay | Admitting: Family

## 2016-09-01 NOTE — Telephone Encounter (Signed)
Pt called in and said that she is needing a refill on her insulin .  Pharmacy on file at [facility]

## 2016-09-28 ENCOUNTER — Other Ambulatory Visit: Payer: Self-pay | Admitting: Family

## 2016-10-12 ENCOUNTER — Other Ambulatory Visit: Payer: Self-pay | Admitting: Family

## 2016-10-14 ENCOUNTER — Other Ambulatory Visit: Payer: Self-pay | Admitting: Family

## 2016-11-06 ENCOUNTER — Emergency Department (HOSPITAL_COMMUNITY): Payer: BC Managed Care – PPO

## 2016-11-06 ENCOUNTER — Encounter (HOSPITAL_COMMUNITY): Payer: Self-pay | Admitting: Emergency Medicine

## 2016-11-06 DIAGNOSIS — Z5321 Procedure and treatment not carried out due to patient leaving prior to being seen by health care provider: Secondary | ICD-10-CM | POA: Diagnosis not present

## 2016-11-06 DIAGNOSIS — R079 Chest pain, unspecified: Secondary | ICD-10-CM | POA: Diagnosis present

## 2016-11-06 DIAGNOSIS — R51 Headache: Secondary | ICD-10-CM | POA: Insufficient documentation

## 2016-11-06 DIAGNOSIS — R202 Paresthesia of skin: Secondary | ICD-10-CM | POA: Insufficient documentation

## 2016-11-06 LAB — I-STAT TROPONIN, ED: Troponin i, poc: 0 ng/mL (ref 0.00–0.08)

## 2016-11-06 LAB — CBC
HCT: 45.5 % (ref 36.0–46.0)
Hemoglobin: 15.2 g/dL — ABNORMAL HIGH (ref 12.0–15.0)
MCH: 28.5 pg (ref 26.0–34.0)
MCHC: 33.4 g/dL (ref 30.0–36.0)
MCV: 85.4 fL (ref 78.0–100.0)
Platelets: 215 10*3/uL (ref 150–400)
RBC: 5.33 MIL/uL — ABNORMAL HIGH (ref 3.87–5.11)
RDW: 13.3 % (ref 11.5–15.5)
WBC: 6.9 10*3/uL (ref 4.0–10.5)

## 2016-11-06 LAB — BASIC METABOLIC PANEL
Anion gap: 9 (ref 5–15)
BUN: 6 mg/dL (ref 6–20)
CO2: 28 mmol/L (ref 22–32)
Calcium: 8.9 mg/dL (ref 8.9–10.3)
Chloride: 98 mmol/L — ABNORMAL LOW (ref 101–111)
Creatinine, Ser: 0.72 mg/dL (ref 0.44–1.00)
GFR calc Af Amer: >60 mL/min (ref >60)
GFR calc non Af Amer: >60 mL/min (ref >60)
Glucose, Bld: 339 mg/dL — ABNORMAL HIGH (ref 65–99)
Potassium: 3.3 mmol/L — ABNORMAL LOW (ref 3.5–5.1)
Sodium: 135 mmol/L (ref 135–145)

## 2016-11-06 NOTE — ED Triage Notes (Signed)
Reports intermittent right sided chest pain for several days as well as right side headache with numbness to left arm for a couple of days.  Numbness worse today.  Has not taken anything for pain.  Also has not taken bp meds today.

## 2016-11-07 ENCOUNTER — Emergency Department (HOSPITAL_COMMUNITY)
Admission: EM | Admit: 2016-11-07 | Discharge: 2016-11-07 | Disposition: A | Discharge status: Home / Self Care | Payer: BC Managed Care – PPO | Attending: Emergency Medicine | Admitting: Emergency Medicine

## 2016-11-07 NOTE — ED Notes (Signed)
Patient updated on delays and wait time 

## 2016-11-07 NOTE — ED Notes (Signed)
Patient up to desk, states she is leaving.  Did not allow this RN enough time to review her chart or talk to her about return precautions.  Will d/c

## 2016-11-08 ENCOUNTER — Emergency Department (HOSPITAL_COMMUNITY)
Admission: EM | Admit: 2016-11-08 | Discharge: 2016-11-08 | Discharge status: Against Medical Advice | Payer: BC Managed Care – PPO | Attending: Emergency Medicine | Admitting: Emergency Medicine

## 2016-11-08 DIAGNOSIS — Z5321 Procedure and treatment not carried out due to patient leaving prior to being seen by health care provider: Secondary | ICD-10-CM | POA: Insufficient documentation

## 2016-11-08 DIAGNOSIS — R51 Headache: Secondary | ICD-10-CM | POA: Diagnosis present

## 2016-11-08 NOTE — ED Triage Notes (Signed)
Pt states she has had left sided facial numbness and arm numbness since Friday. Pt states she went to [facility] yesterday, but left because the wait was too long. Pt is ambulatory with no difficulty. Pt reports headache x 1 week on the right side of her head that she would rate 7/10. Pt is hypertensive at time of assessment. Pt has equally strong extremities. Pt had chest xray yesterday for reported SOB. Pt has clear lung sounds at time of assessment. Pt has no alteration in sensation. Pt has no noted asymmetry

## 2016-11-12 ENCOUNTER — Ambulatory Visit (INDEPENDENT_AMBULATORY_CARE_PROVIDER_SITE_OTHER): Payer: BC Managed Care – PPO | Admitting: Internal Medicine

## 2016-11-12 ENCOUNTER — Encounter: Payer: Self-pay | Admitting: Internal Medicine

## 2016-11-12 VITALS — BP 122/82 | HR 91 | Ht 62.0 in | Wt 288.6 lb

## 2016-11-12 DIAGNOSIS — G4733 Obstructive sleep apnea (adult) (pediatric): Secondary | ICD-10-CM | POA: Diagnosis not present

## 2016-11-12 NOTE — Patient Instructions (Signed)
Order- new DME, new CPAP,  Auto 5-15, mask of choice humidifier, supplies, AirView    Dx OSA  Please drive carefully- stay alert, or pull over so you don't hurt yourself or somebody else.

## 2016-11-12 NOTE — Progress Notes (Signed)
11/12/16- 46 year old female never smoker for sleep evaluation. Medical problem list includes allergic rhinitis, ANA positive, anemia, anxiety and depression, DM 2, HBP NPSG 11/13/15- AHI 12.1/ hr, desaturation to 84%, body weight 289 pounds Pt had sleep study one year ago, diagnosed with OSA and referred now by Mauricio Po. Had the sleep study across the street located by [facility]. Does not have CPAP machine at this time, waiting to hear back from someone regarding her machine. Sleep is not refreshing and she feels tired during the day. Aware of loud snoring. No significant limb movement or other parasomnia. ENT surgery-none. History of CHF and HBP. 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 Bedtime between 10 and midnight with short sleep latency, waking once or twice before up around 6:30 AM [place] 12/24  Prior to Admission medications   Medication Sig Start Date End Date Taking? Authorizing Provider  BD PEN NEEDLE NANO U/F 32G X 4 MM MISC USE ONCE DAILY AS DIRECTED. 08/28/16  Yes Golden Circle, FNP  Blood Glucose Monitoring Suppl (ONE TOUCH ULTRA MINI) w/Device KIT Use device to test blood sugars 1-4 times daily as instructed. 06/24/15  Yes Golden Circle, FNP  bromocriptine (PARLODEL) 2.5 MG tablet Take 1/4 tablet by mouth daily. 07/30/16  Yes Golden Circle, FNP  EDARBYCLOR 40-25 MG TABS TAKE 1 TABLET ONCE DAILY. 08/26/16  Yes Golden Circle, FNP  fluconazole (DIFLUCAN) 150 MG tablet TAKE 1 TABLET NOW, THEN REPEAT IN 72 HOURS IF NEEDED. 10/15/16  Yes Golden Circle, FNP  GLIPIZIDE XL 5 MG 24 hr tablet TAKE 1 TABLET WITH BREAKFAST. 03/27/16  Yes Golden Circle, FNP  glucose blood test strip Use as instructed 06/24/15  Yes Golden Circle, FNP  Insulin Glargine (BASAGLAR KWIKPEN) 100 UNIT/ML SOPN Inject 0.2 mLs (20 Units total) into the skin at bedtime. 04/30/16  Yes Golden Circle, FNP  Insulin Glargine (BASAGLAR KWIKPEN) 100 UNIT/ML SOPN Inject 0.3 mLs (30 Units  total) into the skin at bedtime. 05/08/16  Yes Golden Circle, FNP  Insulin Glargine (BASAGLAR KWIKPEN) 100 UNIT/ML SOPN INJECT 30 UNITS INTO THE SKIN AT BEDTIME 09/01/16  Yes Golden Circle, FNP  Lancets MISC Use one lancet per test to check blood sugar 1-4 times daily. Dx code:E11.9 12/27/15  Yes Golden Circle, FNP  NON FORMULARY Take 1 capsule by mouth daily. Chaga Supplement   Yes [provider]  OVER THE COUNTER MEDICATION Take 1 capsule by mouth daily. Techui Supplement   Yes [provider]  terconazole (TERAZOL 7) 0.4 % vaginal cream Place 1 applicator vaginally at bedtime. 10/17/15  Yes Golden Circle, FNP  TRULICITY 1.5 WK/0.8UP SOPN INJECT 0.5ML SUBCUTANEOUSLY ONCE A WEEK AS DIRECTED. 10/13/16  Yes Golden Circle, FNP  potassium chloride SA (K-DUR,KLOR-CON) 20 MEQ tablet Take 1 tablet (20 mEq total) by mouth 2 (two) times daily. Patient not taking: Reported on 11/12/2016 06/27/14   Janith Lima, MD   Past Medical History:  Diagnosis Date  . Allergy    rhinitis  . Anemia   . Anxiety   . Diabetes mellitus without complication ([place])   . Enlarged heart   . Hypertension   . Morbid obesity ([place])   . Shortness of breath dyspnea    with low iron   Past Surgical History:  Procedure Laterality Date  . ABDOMINAL HYSTERECTOMY N/A 01/29/2015   Procedure: TOTAL ABDOMINAL HYSTERECTOMY ;  Surgeon: Ena Dawley, MD;  Location: [facility];  Service: Gynecology;  Laterality: N/A;  . BILATERAL SALPINGECTOMY Bilateral 01/29/2015   Procedure: BILATERAL SALPINGECTOMY;  Surgeon: Ena Dawley, MD;  Location: [facility];  Service: Gynecology;  Laterality: Bilateral;  . CESAREAN SECTION    . KNEE SURGERY  1992   ? arthroscopic/ right   Family History  Problem Relation Age of Onset  . Hypertension Mother   . Diabetes Mother   . Coronary artery disease Father   . Heart failure Father   . Heart attack Father   . Diabetes Father   . Hypertension Other   .  Hyperlipidemia Other    Social History   Social History  . Marital status: Single    Spouse name: N/A  . Number of children: N/A  . Years of education: N/A   Occupational History  . Not on file.   Social History Main Topics  . Smoking status: Never Smoker  . Smokeless tobacco: Never Used  . Alcohol use No  . Drug use: No  . Sexual activity: No   Other Topics Concern  . Not on file   Social History Narrative   Regular exercise   ROS-see HPI   + = Positive Constitutional:    weight loss, night sweats, fevers, chills, + fatigue, lassitude. HEENT:    + headaches, difficulty swallowing, tooth/dental problems, sore throat,       sneezing, itching, ear ache, nasal congestion, post nasal drip, snoring CV:    chest pain, orthopnea, PND, + swelling in lower extremities, anasarca,                                                        dizziness, palpitations Resp:  + shortness of breath with exertion or at rest.                productive cough,   non-productive cough, coughing up of blood.              change in color of mucus.  wheezing.   Skin:    rash or lesions. GI:     heartburn, + indigestion, abdominal pain, nausea, vomiting, diarrhea,                 change in bowel habits, loss of appetite GU: dysuria, change in color of urine, no urgency or frequency.   flank pain. MS:   joint pain, stiffness, decreased range of motion, back pain. Neuro-     nothing unusual Psych:  change in mood or affect.  + depression or + anxiety.   memory loss.  OBJ- Physical Exam General- Alert, Oriented, Affect-appropriate, Distress- none acute, + drowsy, + obese Skin- rash-none, lesions- none, excoriation- none Lymphadenopathy- none Head- atraumatic            Eyes- Gross vision intact, PERRLA, conjunctivae and secretions clear            Ears- Hearing, canals-normal            Nose- Clear, no-Septal dev, mucus, polyps, erosion, perforation             Throat- Mallampati IV , mucosa clear ,  drainage- none, tonsils- atrophic Neck- flexible , trachea midline, no stridor , thyroid nl, carotid no bruit Chest - symmetrical excursion , unlabored  Heart/CV- RRR , no murmur , no gallop  , no rub, nl s1 s2                           - JVD- none , edema- none, stasis changes- none, varices- none           Lung- clear to P&A, wheeze- none, cough- none , dullness-none, rub- none           Chest wall-  Abd-  Br/ Gen/ Rectal- Not done, not indicated Extrem- cyanosis- none, clubbing, none, atrophy- none, strength- nl Neuro- + nonfocal but drowsy

## 2016-11-13 ENCOUNTER — Telehealth: Payer: Self-pay | Admitting: Internal Medicine

## 2016-11-13 NOTE — Telephone Encounter (Signed)
ATC Rodney, call went straight to VM. Left a message for him to call back.

## 2016-11-14 NOTE — Assessment & Plan Note (Signed)
Her obesity directly impacts several medical problems including her sleep apnea and diabetes. Consider bariatric referral.

## 2016-11-14 NOTE — Assessment & Plan Note (Signed)
Symptoms are consistent with sleep disruption from obstructive sleep apnea. We discussed her responsibility to drive safely and we discussed basic sleep hygiene. Sleep apnea was reviewed and treatment options considered. Plan-start CPAP auto 5-15

## 2016-11-17 NOTE — Telephone Encounter (Signed)
Called and spoke with Lindsay Gomez and he stated that everything has been done and the order has been completed.

## 2016-11-18 ENCOUNTER — Other Ambulatory Visit: Payer: Self-pay | Admitting: Family

## 2016-11-18 DIAGNOSIS — I10 Essential (primary) hypertension: Secondary | ICD-10-CM

## 2017-01-04 ENCOUNTER — Ambulatory Visit (INDEPENDENT_AMBULATORY_CARE_PROVIDER_SITE_OTHER)
Admission: RE | Admit: 2017-01-04 | Discharge: 2017-01-04 | Disposition: A | Discharge status: Home / Self Care | Payer: BC Managed Care – PPO | Source: Ambulatory Visit | Attending: Nurse Practitioner | Admitting: Nurse Practitioner

## 2017-01-04 ENCOUNTER — Ambulatory Visit (INDEPENDENT_AMBULATORY_CARE_PROVIDER_SITE_OTHER): Payer: BC Managed Care – PPO | Admitting: Nurse Practitioner

## 2017-01-04 ENCOUNTER — Telehealth: Payer: Self-pay | Admitting: Family

## 2017-01-04 ENCOUNTER — Encounter: Payer: Self-pay | Admitting: Nurse Practitioner

## 2017-01-04 ENCOUNTER — Other Ambulatory Visit: Payer: Self-pay | Admitting: Nurse Practitioner

## 2017-01-04 VITALS — BP 130/82 | HR 83 | Temp 97.5°F | Ht 62.0 in | Wt 291.0 lb

## 2017-01-04 DIAGNOSIS — G8929 Other chronic pain: Secondary | ICD-10-CM

## 2017-01-04 DIAGNOSIS — M25551 Pain in right hip: Secondary | ICD-10-CM

## 2017-01-04 MED ORDER — MELOXICAM 7.5 MG PO TABS: 7.5000 mg | ORAL_TABLET | Freq: Every day | ORAL | 0 refills | Status: DC

## 2017-01-04 MED ORDER — KETOROLAC TROMETHAMINE 30 MG/ML IJ SOLN
30.0000 mg | Freq: Once | INTRAMUSCULAR | Status: AC
  Administered 2017-01-04: 30 mg via INTRAMUSCULAR

## 2017-01-04 NOTE — Patient Instructions (Addendum)
Normal hip and pelvic x-ray. Consider MRI hip or referral to ortho if no improvement in 1week. Use meloxicam as prescribed Please call endocrinology for DM management ASAP.

## 2017-01-04 NOTE — Progress Notes (Signed)
Subjective:  Patient ID: Lindsay Gomez, female    DOB: [birth year]  Age: 46 y.o. MRN: [id number]  CC: Hip Pain (right hip pain goes down to knee at times. going on for 6 mo--only thing help is laying down. ) and Follow-up (DM testing consult? diflucan consult?)  Hip Pain   The incident occurred more than 1 week ago. There was no injury mechanism. The pain is present in the right hip (and right side lower back). The quality of the pain is described as aching. The pain has been constant since onset. Pertinent negatives include no inability to bear weight, loss of motion, loss of sensation, muscle weakness, numbness or tingling. The symptoms are aggravated by movement, palpation and weight bearing. She has tried nothing for the symptoms.   Right Hip pain: Chronic, onset over 2014. Lumbar spine X-ray (DDD), MRI ordered but never completed. Waxing and waning.  no change in GI/GU function.  DM: Uncontrolled. Needs f/up with endocrinology to discuss management and glucometer.  Outpatient Medications Prior to Visit  Medication Sig Dispense Refill  . BD PEN NEEDLE NANO U/F 32G X 4 MM MISC USE ONCE DAILY AS DIRECTED. 100 each 0  . Blood Glucose Monitoring Suppl (ONE TOUCH ULTRA MINI) w/Device KIT Use device to test blood sugars 1-4 times daily as instructed. 1 each 0  . EDARBYCLOR 40-25 MG TABS TAKE 1 TABLET ONCE DAILY. 30 tablet 0  . fluconazole (DIFLUCAN) 150 MG tablet TAKE 1 TABLET NOW, THEN REPEAT IN 72 HOURS IF NEEDED. 4 tablet 0  . GLIPIZIDE XL 5 MG 24 hr tablet TAKE 1 TABLET WITH BREAKFAST. 30 tablet 4  . glucose blood test strip Use as instructed 100 each 12  . Insulin Glargine (BASAGLAR KWIKPEN) 100 UNIT/ML SOPN INJECT 0.3 ML (30 UNITS) INTO THE SKIN AT BEDTIME. 15 mL 0  . Lancets MISC Use one lancet per test to check blood sugar 1-4 times daily. Dx code:E11.9 100 each 5  . NON FORMULARY Take 1 capsule by mouth daily. Chaga Supplement    . OVER THE COUNTER MEDICATION Take 1 capsule  by mouth daily. Techui Supplement    . TRULICITY 1.5 EY/8.1KG SOPN INJECT 0.5ML SUBCUTANEOUSLY ONCE A WEEK AS DIRECTED. 2 mL 0  . Insulin Glargine (BASAGLAR KWIKPEN) 100 UNIT/ML SOPN Inject 0.2 mLs (20 Units total) into the skin at bedtime. 1 pen 0  . Insulin Glargine (BASAGLAR KWIKPEN) 100 UNIT/ML SOPN Inject 0.3 mLs (30 Units total) into the skin at bedtime. 3 pen 0  . Insulin Glargine (BASAGLAR KWIKPEN) 100 UNIT/ML SOPN INJECT 30 UNITS INTO THE SKIN AT BEDTIME 15 mL 3  . bromocriptine (PARLODEL) 2.5 MG tablet Take 1/4 tablet by mouth daily. (Patient not taking: Reported on 01/04/2017) 8 tablet 2  . potassium chloride SA (K-DUR,KLOR-CON) 20 MEQ tablet Take 1 tablet (20 mEq total) by mouth 2 (two) times daily. (Patient not taking: Reported on 11/12/2016) 180 tablet 1  . terconazole (TERAZOL 7) 0.4 % vaginal cream Place 1 applicator vaginally at bedtime. (Patient not taking: Reported on 01/04/2017) 45 g 0   No facility-administered medications prior to visit.     ROS See HPI  Objective:  BP 130/82   Pulse 83   Temp (!) 97.5 F (36.4 C)   Ht 5' 2" (1.575 m)   Wt 291 lb (132 kg)   LMP 12/18/2014 (Exact Date)   SpO2 99%   BMI 53.22 kg/m   BP Readings from Last 3 Encounters:  01/04/17 130/82  11/12/16 122/82  11/08/16 (!) 156/109    Wt Readings from Last 3 Encounters:  01/04/17 291 lb (132 kg)  11/12/16 288 lb 9.6 oz (130.9 kg)  11/06/16 288 lb (130.6 kg)    Physical Exam  Constitutional: She is oriented to person, place, and time.  Cardiovascular: Normal rate.   Pulmonary/Chest: Effort normal.  Musculoskeletal: She exhibits tenderness. She exhibits no edema or deformity.       Right hip: She exhibits tenderness. She exhibits normal range of motion, normal strength, no swelling, no crepitus and no deformity.       Right knee: Normal.       Lumbar back: Normal.       Right upper leg: Normal.  Diffuse right hip tenderness.  Neurological: She is alert and oriented to person,  place, and time.  Skin: Skin is warm and dry. No rash noted. No erythema.  Vitals reviewed.   Lab Results  Component Value Date   WBC 6.9 11/06/2016   HGB 15.2 (H) 11/06/2016   HCT 45.5 11/06/2016   PLT 215 11/06/2016   GLUCOSE 339 (H) 11/06/2016   CHOL 174 01/06/2013   TRIG 70.0 01/06/2013   HDL 39.70 01/06/2013   LDLDIRECT 140.4 09/24/2011   LDLCALC 120 (H) 01/06/2013   ALT 149 (H) 07/29/2015   AST 92 (H) 07/29/2015   NA 135 11/06/2016   K 3.3 (L) 11/06/2016   CL 98 (L) 11/06/2016   CREATININE 0.72 11/06/2016   BUN 6 11/06/2016   CO2 28 11/06/2016   TSH 1.11 06/17/2015   HGBA1C 12.4 07/30/2016    No results found.  Assessment & Plan:   Rafaela was seen today for hip pain and follow-up.  Diagnoses and all orders for this visit:  Chronic right hip pain -     Cancel: DG HIP UNILAT WITH PELVIS 2-3 VIEWS LEFT; Future -     ketorolac (TORADOL) 30 MG/ML injection 30 mg; Inject 1 mL (30 mg total) into the muscle once. -     meloxicam (MOBIC) 7.5 MG tablet; Take 1 tablet (7.5 mg total) by mouth daily.   I am having Ms. Mccaffrey start on meloxicam. I am also having her maintain her potassium chloride SA, ONE TOUCH ULTRA MINI, glucose blood, terconazole, OVER THE COUNTER MEDICATION, NON FORMULARY, Lancets, GLIPIZIDE XL, bromocriptine, BD PEN NEEDLE NANO U/F, fluconazole, TRULICITY, BASAGLAR KWIKPEN, and EDARBYCLOR. We administered ketorolac.  Meds ordered this encounter  Medications  . ketorolac (TORADOL) 30 MG/ML injection 30 mg  . meloxicam (MOBIC) 7.5 MG tablet    Sig: Take 1 tablet (7.5 mg total) by mouth daily.    Dispense:  30 tablet    Refill:  0    Order Specific Question:   Supervising Provider    Answer:   Binnie Rail [[id number]]    Follow-up: Return if symptoms worsen or fail to improve.  Wilfred Lacy, NP

## 2017-01-04 NOTE — Telephone Encounter (Signed)
[facility]  Patient Name: Lindsay Gomez  DOB: [birth year]    Initial Comment Caller states She has severe pain on Rt hip, hurts to stand or sit. -- she wanted to set up an appt w/ Dr Colon, but told the Dr was no longer at the practice. Appt line sent her to triage.    Nurse Assessment  Nurse: Rene Kocher, RN, [place] Date/Time (Eastern Time): 01/04/2017 11:28:39 AM  Confirm and document reason for call. If symptomatic, describe symptoms. ---Caller states She has severe pain on Rt hip, hurts to stand or sit. -- she wanted to set up an appt w/ Dr Elna Breslow, but told the Dr was no longer at the practice. Appt line sent her to triage. Onset of pain has been months ago and has progressively gotten worse. laying down is the only thing that helps relieve the pain. denies injury to the hip. sometimes the pain radiates down into her thigh. pain 10/10.  Does the patient have any new or worsening symptoms? ---Yes  Will a triage be completed? ---Yes  Related visit to physician within the last 2 weeks? ---No  Does the PT have any chronic conditions? (i.e. diabetes, asthma, etc.) ---Yes  List chronic conditions. ---diabetes, high blood pressure  Is the patient pregnant or possibly pregnant? (Ask all females between the ages of 34-55) ---No  Is this a behavioral health or substance abuse call? ---No     Guidelines    Guideline Title Affirmed Question Affirmed Notes  Hip Pain [1] SEVERE pain (e.g., excruciating, unable to do any normal activities) AND [2] not improved after 2 hours of pain medicine    Final Disposition User   See Physician within 4 Hours (or PCP triage) Rene Kocher, RN, [place]    Comments  appointment made in [facility] for today at 86 with Wilfred Lacy, NP   Referrals  REFERRED TO PCP OFFICE  REFERRED TO PCP OFFICE   Caller Disagree/Comply Comply  Caller Understands Yes  PreDisposition Call Doctor

## 2017-01-15 ENCOUNTER — Ambulatory Visit: Payer: Self-pay | Admitting: Internal Medicine

## 2017-01-18 ENCOUNTER — Encounter: Payer: Self-pay | Admitting: Nurse Practitioner

## 2017-01-18 ENCOUNTER — Ambulatory Visit: Payer: BC Managed Care – PPO | Admitting: Nurse Practitioner

## 2017-01-18 VITALS — BP 140/88 | HR 94 | Temp 98.2°F | Ht 62.0 in | Wt 291.0 lb

## 2017-01-18 DIAGNOSIS — F321 Major depressive disorder, single episode, moderate: Secondary | ICD-10-CM

## 2017-01-18 DIAGNOSIS — Z794 Long term (current) use of insulin: Secondary | ICD-10-CM

## 2017-01-18 DIAGNOSIS — I10 Essential (primary) hypertension: Secondary | ICD-10-CM

## 2017-01-18 DIAGNOSIS — E1169 Type 2 diabetes mellitus with other specified complication: Secondary | ICD-10-CM

## 2017-01-18 DIAGNOSIS — E876 Hypokalemia: Secondary | ICD-10-CM

## 2017-01-18 MED ORDER — BASAGLAR KWIKPEN 100 UNIT/ML ~~LOC~~ SOPN: PEN_INJECTOR | SUBCUTANEOUS | 0 refills | Status: DC

## 2017-01-18 MED ORDER — INSULIN PEN NEEDLE 32G X 4 MM MISC: 0 refills | Status: DC

## 2017-01-18 MED ORDER — BROMOCRIPTINE MESYLATE 2.5 MG PO TABS: ORAL_TABLET | ORAL | 0 refills | Status: DC

## 2017-01-18 MED ORDER — AZILSARTAN-CHLORTHALIDONE 40-25 MG PO TABS: 1.0000 | ORAL_TABLET | Freq: Every day | ORAL | 0 refills | Status: DC

## 2017-01-18 MED ORDER — DULAGLUTIDE 1.5 MG/0.5ML ~~LOC~~ SOAJ: SUBCUTANEOUS | 0 refills | Status: DC

## 2017-01-18 MED ORDER — GLIPIZIDE ER 5 MG PO TB24: ORAL_TABLET | ORAL | 0 refills | Status: DC

## 2017-01-18 MED ORDER — POTASSIUM CHLORIDE CRYS ER 20 MEQ PO TBCR: 20.0000 meq | EXTENDED_RELEASE_TABLET | Freq: Two times a day (BID) | ORAL | 0 refills | Status: DC

## 2017-01-18 NOTE — Progress Notes (Signed)
Subjective:  Patient ID: ELISA KUTNER, female    DOB: 1970/11/16  Age: 46 y.o. MRN: [id number]  CC: Follow-up (BS high,insulin consult? thristy and frequent urinate/ yeast infection,diflucan?)   Diabetes  She presents for her follow-up diabetic visit. She has type 2 diabetes mellitus. No MedicAlert identification noted. Her disease course has been worsening. There are no hypoglycemic associated symptoms. Associated symptoms include polydipsia, polyphagia and polyuria. (And fatigue) Risk factors for coronary artery disease include diabetes mellitus, obesity and sedentary lifestyle. Current diabetic treatment includes oral agent (monotherapy) and insulin injections. She is compliant with treatment some of the time. Her weight is increasing steadily. She is following a generally unhealthy diet. When asked about meal planning, she reported none. She has had a previous visit with a dietitian (reports it was not helpful). She never participates in exercise. Home blood sugar record trend: does not check glucose daily. Her breakfast blood glucose range is generally >200 mg/dl. An ACE inhibitor/angiotensin II receptor blocker is not being taken. She does not see a podiatrist.Eye exam is not current.   Depression and Anxiety: Mood is worse.(low energy, fatigue, no motivation, increased somnolence) No SI and HI Evaluated by psychiatry in past. Does not want to return. States she does not want to take any medication due to potential side effects.  Outpatient Medications Prior to Visit  Medication Sig Dispense Refill  . Blood Glucose Monitoring Suppl (ONE TOUCH ULTRA MINI) w/Device KIT Use device to test blood sugars 1-4 times daily as instructed. 1 each 0  . glucose blood test strip Use as instructed 100 each 12  . Lancets MISC Use one lancet per test to check blood sugar 1-4 times daily. Dx code:E11.9 100 each 5  . meloxicam (MOBIC) 7.5 MG tablet Take 1 tablet (7.5 mg total) by mouth daily. 30 tablet  0  . NON FORMULARY Take 1 capsule by mouth daily. Chaga Supplement    . OVER THE COUNTER MEDICATION Take 1 capsule by mouth daily. Techui Supplement    . BD PEN NEEDLE NANO U/F 32G X 4 MM MISC USE ONCE DAILY AS DIRECTED. 100 each 0  . EDARBYCLOR 40-25 MG TABS TAKE 1 TABLET ONCE DAILY. 30 tablet 0  . fluconazole (DIFLUCAN) 150 MG tablet TAKE 1 TABLET NOW, THEN REPEAT IN 72 HOURS IF NEEDED. 4 tablet 0  . GLIPIZIDE XL 5 MG 24 hr tablet TAKE 1 TABLET WITH BREAKFAST. 30 tablet 4  . Insulin Glargine (BASAGLAR KWIKPEN) 100 UNIT/ML SOPN INJECT 0.3 ML (30 UNITS) INTO THE SKIN AT BEDTIME. 15 mL 0  . TRULICITY 1.5 MM/7.6KG SOPN INJECT 0.5ML SUBCUTANEOUSLY ONCE A WEEK AS DIRECTED. 2 mL 0  . bromocriptine (PARLODEL) 2.5 MG tablet Take 1/4 tablet by mouth daily. (Patient not taking: Reported on 01/04/2017) 8 tablet 2  . potassium chloride SA (K-DUR,KLOR-CON) 20 MEQ tablet Take 1 tablet (20 mEq total) by mouth 2 (two) times daily. (Patient not taking: Reported on 11/12/2016) 180 tablet 1  . terconazole (TERAZOL 7) 0.4 % vaginal cream Place 1 applicator vaginally at bedtime. (Patient not taking: Reported on 01/04/2017) 45 g 0   No facility-administered medications prior to visit.     ROS See HPI  Objective:  BP 140/88   Pulse 94   Temp 98.2 F (36.8 C)   Ht _0  (1.575 m)   Wt 291 lb (132 kg)   LMP 12/18/2014 (Exact Date)   SpO2 97%   BMI 53.22 kg/m   BP Readings from Last 3  Encounters:  01/18/17 140/88  01/04/17 130/82  11/12/16 122/82    Wt Readings from Last 3 Encounters:  01/18/17 291 lb (132 kg)  01/04/17 291 lb (132 kg)  11/12/16 288 lb 9.6 oz (130.9 kg)    Physical Exam  Constitutional: She is oriented to person, place, and time. No distress.  Cardiovascular: Normal rate.  Pulmonary/Chest: Effort normal.  Neurological: She is alert and oriented to person, place, and time.  Skin: Skin is warm and dry.  Vitals reviewed.   Lab Results  Component Value Date   WBC 6.9  11/06/2016   HGB 15.2 (H) 11/06/2016   HCT 45.5 11/06/2016   PLT 215 11/06/2016   GLUCOSE 339 (H) 11/06/2016   CHOL 174 01/06/2013   TRIG 70.0 01/06/2013   HDL 39.70 01/06/2013   LDLDIRECT 140.4 09/24/2011   LDLCALC 120 (H) 01/06/2013   ALT 149 (H) 07/29/2015   AST 92 (H) 07/29/2015   NA 135 11/06/2016   K 3.3 (L) 11/06/2016   CL 98 (L) 11/06/2016   CREATININE 0.72 11/06/2016   BUN 6 11/06/2016   CO2 28 11/06/2016   TSH 1.11 06/17/2015   HGBA1C 12.4 07/30/2016    Dg Hip Unilat With Pelvis 2-3 Views Right  Result Date: 01/04/2017 CLINICAL DATA:  Chronic right hip pain which has worsened over the past 2 months. EXAM: DG HIP (WITH OR WITHOUT PELVIS) 2-3V RIGHT COMPARISON:  None. FINDINGS: There is no evidence of hip fracture or dislocation. There is no evidence of arthropathy or other focal bone abnormality. IMPRESSION: Negative exam. Electronically Signed   By: Inge Rise M.D.   On: 01/04/2017 16:40    Assessment & Plan:   Lunell was seen today for follow-up.  Diagnoses and all orders for this visit:  Type 2 diabetes mellitus with other specified complication, with long-term current use of insulin (HCC) -     Dulaglutide (TRULICITY) 1.5 ZO/1.0RU SOPN; INJECT 0.5ML SUBCUTANEOUSLY ONCE A WEEK AS DIRECTED. -     bromocriptine (PARLODEL) 2.5 MG tablet; Take 1/4 tablet by mouth daily. -     Discontinue: Insulin Glargine (BASAGLAR KWIKPEN) 100 UNIT/ML SOPN; INJECT 0.3 ML (30 UNITS) INTO THE SKIN AT BEDTIME. -     glipiZIDE (GLIPIZIDE XL) 5 MG 24 hr tablet; TAKE 1 TABLET WITH BREAKFAST. -     potassium chloride SA (K-DUR,KLOR-CON) 20 MEQ tablet; Take 1 tablet (20 mEq total) 2 (two) times daily by mouth. -     Insulin Pen Needle (BD PEN NEEDLE NANO U/F) 32G X 4 MM MISC; USE ONCE DAILY AS DIRECTED. -     Insulin Glargine (BASAGLAR KWIKPEN) 100 UNIT/ML SOPN; Inject 0.4 mLs (40 Units total) daily at 10 pm into the skin.  Major depressive disorder, single episode, moderate  (HCC)  Essential hypertension -     potassium chloride SA (K-DUR,KLOR-CON) 20 MEQ tablet; Take 1 tablet (20 mEq total) 2 (two) times daily by mouth. -     Azilsartan-Chlorthalidone (EDARBYCLOR) 40-25 MG TABS; Take 1 tablet daily by mouth.  Hypokalemia -     potassium chloride SA (K-DUR,KLOR-CON) 20 MEQ tablet; Take 1 tablet (20 mEq total) 2 (two) times daily by mouth.   I have discontinued Jaydy E. Keaney's terconazole, fluconazole, and BASAGLAR KWIKPEN. I have changed her TRULICITY to Dulaglutide, GLIPIZIDE XL to glipiZIDE, EDARBYCLOR to Azilsartan-Chlorthalidone, and BD PEN NEEDLE NANO U/F to Insulin Pen Needle. I have also changed her potassium chloride SA and BASAGLAR KWIKPEN. Additionally, I am having her maintain her  ONE TOUCH ULTRA MINI, glucose blood, OVER THE COUNTER MEDICATION, NON FORMULARY, Lancets, meloxicam, and bromocriptine.  Meds ordered this encounter  Medications  . Dulaglutide (TRULICITY) 1.5 GU/5.4YH SOPN    Sig: INJECT 0.5ML SUBCUTANEOUSLY ONCE A WEEK AS DIRECTED.    Dispense:  2 mL    Refill:  0    Order Specific Question:   Supervising Provider    Answer:   Cassandria Anger [[id number]]  . bromocriptine (PARLODEL) 2.5 MG tablet    Sig: Take 1/4 tablet by mouth daily.    Dispense:  8 tablet    Refill:  0    Order Specific Question:   Supervising Provider    Answer:   Cassandria Anger [[id number]]  . DISCONTD: Insulin Glargine (BASAGLAR KWIKPEN) 100 UNIT/ML SOPN    Sig: INJECT 0.3 ML (30 UNITS) INTO THE SKIN AT BEDTIME.    Dispense:  15 mL    Refill:  0    Order Specific Question:   Supervising Provider    Answer:   Cassandria Anger [[id number]]  . glipiZIDE (GLIPIZIDE XL) 5 MG 24 hr tablet    Sig: TAKE 1 TABLET WITH BREAKFAST.    Dispense:  30 tablet    Refill:  0    Order Specific Question:   Supervising Provider    Answer:   Cassandria Anger [[id number]]  . potassium chloride SA (K-DUR,KLOR-CON) 20 MEQ tablet    Sig: Take 1 tablet (20 mEq total) 2 (two)  times daily by mouth.    Dispense:  60 tablet    Refill:  0    Order Specific Question:   Supervising Provider    Answer:   Cassandria Anger [[id number]]  . Azilsartan-Chlorthalidone (EDARBYCLOR) 40-25 MG TABS    Sig: Take 1 tablet daily by mouth.    Dispense:  30 tablet    Refill:  0    Order Specific Question:   Supervising Provider    Answer:   Cassandria Anger [[id number]]  . Insulin Pen Needle (BD PEN NEEDLE NANO U/F) 32G X 4 MM MISC    Sig: USE ONCE DAILY AS DIRECTED.    Dispense:  100 each    Refill:  0    Order Specific Question:   Supervising Provider    Answer:   Cassandria Anger [[id number]]  . Insulin Glargine (BASAGLAR KWIKPEN) 100 UNIT/ML SOPN    Sig: Inject 0.4 mLs (40 Units total) daily at 10 pm into the skin.    Dispense:  15 mL    Refill:  0    Order Specific Question:   Supervising Provider    Answer:   Cassandria Anger [[id number]]    Follow-up: Return for DM and HTN.  Wilfred Lacy, NP

## 2017-01-18 NOTE — Patient Instructions (Addendum)
You have to contact endocrinology for adequate DM management.  Increase basaglar to 40units. Then She is increase dose by 1unit once a week until fasting glucose is <200.  Diabetes Mellitus and Food It is important for you to manage your blood sugar (glucose) level. Your blood glucose level can be greatly affected by what you eat. Eating healthier foods in the appropriate amounts throughout the day at about the same time each day will help you control your blood glucose level. It can also help slow or prevent worsening of your diabetes mellitus. Healthy eating may even help you improve the level of your blood pressure and reach or maintain a healthy weight. General recommendations for healthful eating and cooking habits include:  Eating meals and snacks regularly. Avoid going long periods of time without eating to lose weight.  Eating a diet that consists mainly of plant-based foods, such as fruits, vegetables, nuts, legumes, and whole grains.  Using low-heat cooking methods, such as baking, instead of high-heat cooking methods, such as deep frying.  Work with your dietitian to make sure you understand how to use the Nutrition Facts information on food labels. How can food affect me? Carbohydrates Carbohydrates affect your blood glucose level more than any other type of food. Your dietitian will help you determine how many carbohydrates to eat at each meal and teach you how to count carbohydrates. Counting carbohydrates is important to keep your blood glucose at a healthy level, especially if you are using insulin or taking certain medicines for diabetes mellitus. Alcohol Alcohol can cause sudden decreases in blood glucose (hypoglycemia), especially if you use insulin or take certain medicines for diabetes mellitus. Hypoglycemia can be a life-threatening condition. Symptoms of hypoglycemia (sleepiness, dizziness, and disorientation) are similar to symptoms of having too much alcohol. If your  health care provider has given you approval to drink alcohol, do so in moderation and use the following guidelines:  Women should not have more than one drink per day, and men should not have more than two drinks per day. One drink is equal to: ? 12 oz of beer. ? 5 oz of wine. ? 1 oz of hard liquor.  Do not drink on an empty stomach.  Keep yourself hydrated. Have water, diet soda, or unsweetened iced tea.  Regular soda, juice, and other mixers might contain a lot of carbohydrates and should be counted.  What foods are not recommended? As you make food choices, it is important to remember that all foods are not the same. Some foods have fewer nutrients per serving than other foods, even though they might have the same number of calories or carbohydrates. It is difficult to get your body what it needs when you eat foods with fewer nutrients. Examples of foods that you should avoid that are high in calories and carbohydrates but low in nutrients include:  Trans fats (most processed foods list trans fats on the Nutrition Facts label).  Regular soda.  Juice.  Candy.  Sweets, such as cake, pie, doughnuts, and cookies.  Fried foods.  What foods can I eat? Eat nutrient-rich foods, which will nourish your body and keep you healthy. The food you should eat also will depend on several factors, including:  The calories you need.  The medicines you take.  Your weight.  Your blood glucose level.  Your blood pressure level.  Your cholesterol level.  You should eat a variety of foods, including:  Protein. ? Lean cuts of meat. ? Proteins low in  saturated fats, such as fish, egg whites, and beans. Avoid processed meats.  Fruits and vegetables. ? Fruits and vegetables that may help control blood glucose levels, such as apples, mangoes, and yams.  Dairy products. ? Choose fat-free or low-fat dairy products, such as milk, yogurt, and cheese.  Grains, bread, pasta, and  rice. ? Choose whole grain products, such as multigrain bread, whole oats, and brown rice. These foods may help control blood pressure.  Fats. ? Foods containing healthful fats, such as nuts, avocado, olive oil, canola oil, and fish.  Does everyone with diabetes mellitus have the same meal plan? Because every person with diabetes mellitus is different, there is not one meal plan that works for everyone. It is very important that you meet with a dietitian who will help you create a meal plan that is just right for you. This information is not intended to replace advice given to you by your health care provider. Make sure you discuss any questions you have with your health care provider. Document Released: 11/27/2004 Document Revised: 08/08/2015 Document Reviewed: 01/27/2013 Elsevier Interactive Patient Education  2017 [employer].

## 2017-01-19 MED ORDER — BASAGLAR KWIKPEN 100 UNIT/ML ~~LOC~~ SOPN: 40.0000 [IU] | PEN_INJECTOR | Freq: Every day | SUBCUTANEOUS | 0 refills | Status: DC

## 2017-01-20 ENCOUNTER — Telehealth: Payer: Self-pay | Admitting: Nurse Practitioner

## 2017-01-20 NOTE — Telephone Encounter (Signed)
Left vm for the pt to call back, need to inform of massage below.

## 2017-01-20 NOTE — Telephone Encounter (Signed)
-----   Message from Flossie Buffy, NP sent at 01/19/2017 10:10 PM EST ----- Please advise [name] to increase basaglar to 40units. Then She is increase dose by 1unit once a week until fasting glucose is <200. Follow up with endocrinology as discussed.

## 2017-01-21 NOTE — Telephone Encounter (Signed)
Left another vm for the pt to call back.  

## 2017-01-22 ENCOUNTER — Encounter: Payer: Self-pay | Admitting: Nurse Practitioner

## 2017-01-22 NOTE — Telephone Encounter (Signed)
Letter sent.

## 2017-01-22 NOTE — Telephone Encounter (Signed)
Left vm for the pt to call back.  

## 2017-01-28 ENCOUNTER — Ambulatory Visit: Payer: Self-pay | Admitting: Endocrinology

## 2017-03-02 ENCOUNTER — Emergency Department (HOSPITAL_COMMUNITY): Payer: BC Managed Care – PPO

## 2017-03-02 ENCOUNTER — Observation Stay (HOSPITAL_COMMUNITY)
Admission: EM | Admit: 2017-03-02 | Discharge: 2017-03-03 | Disposition: A | Discharge status: Home / Self Care | Payer: BC Managed Care – PPO | Attending: Internal Medicine | Admitting: Internal Medicine

## 2017-03-02 DIAGNOSIS — Z7982 Long term (current) use of aspirin: Secondary | ICD-10-CM | POA: Diagnosis not present

## 2017-03-02 DIAGNOSIS — E1165 Type 2 diabetes mellitus with hyperglycemia: Secondary | ICD-10-CM | POA: Insufficient documentation

## 2017-03-02 DIAGNOSIS — R200 Anesthesia of skin: Secondary | ICD-10-CM | POA: Diagnosis present

## 2017-03-02 DIAGNOSIS — I10 Essential (primary) hypertension: Secondary | ICD-10-CM | POA: Diagnosis present

## 2017-03-02 DIAGNOSIS — Z79899 Other long term (current) drug therapy: Secondary | ICD-10-CM | POA: Diagnosis not present

## 2017-03-02 DIAGNOSIS — I6330 Cerebral infarction due to thrombosis of unspecified cerebral artery: Principal | ICD-10-CM

## 2017-03-02 DIAGNOSIS — E1169 Type 2 diabetes mellitus with other specified complication: Secondary | ICD-10-CM

## 2017-03-02 DIAGNOSIS — Z794 Long term (current) use of insulin: Secondary | ICD-10-CM | POA: Insufficient documentation

## 2017-03-02 DIAGNOSIS — E785 Hyperlipidemia, unspecified: Secondary | ICD-10-CM | POA: Diagnosis not present

## 2017-03-02 DIAGNOSIS — E119 Type 2 diabetes mellitus without complications: Secondary | ICD-10-CM

## 2017-03-02 DIAGNOSIS — Z6841 Body Mass Index (BMI) 40.0 and over, adult: Secondary | ICD-10-CM | POA: Insufficient documentation

## 2017-03-02 DIAGNOSIS — E876 Hypokalemia: Secondary | ICD-10-CM | POA: Insufficient documentation

## 2017-03-02 LAB — I-STAT BETA HCG BLOOD, ED (MC, WL, AP ONLY): I-stat hCG, quantitative: <5 m[IU]/mL

## 2017-03-02 LAB — CBC
HCT: 45.8 % (ref 36.0–46.0)
Hemoglobin: 15.4 g/dL — ABNORMAL HIGH (ref 12.0–15.0)
MCH: 29.1 pg (ref 26.0–34.0)
MCHC: 33.6 g/dL (ref 30.0–36.0)
MCV: 86.6 fL (ref 78.0–100.0)
Platelets: 205 10*3/uL (ref 150–400)
RBC: 5.29 MIL/uL — ABNORMAL HIGH (ref 3.87–5.11)
RDW: 13.3 % (ref 11.5–15.5)
WBC: 6.3 10*3/uL (ref 4.0–10.5)

## 2017-03-02 LAB — I-STAT CHEM 8, ED
BUN: 7 mg/dL (ref 6–20)
Calcium, Ion: 1.1 mmol/L — ABNORMAL LOW (ref 1.15–1.40)
Chloride: 90 mmol/L — ABNORMAL LOW (ref 101–111)
Creatinine, Ser: 0.6 mg/dL (ref 0.44–1.00)
Glucose, Bld: 467 mg/dL — ABNORMAL HIGH (ref 65–99)
HCT: 51 % — ABNORMAL HIGH (ref 36.0–46.0)
Hemoglobin: 17.3 g/dL — ABNORMAL HIGH (ref 12.0–15.0)
Potassium: 3.3 mmol/L — ABNORMAL LOW (ref 3.5–5.1)
Sodium: 136 mmol/L (ref 135–145)
TCO2: 30 mmol/L (ref 22–32)

## 2017-03-02 LAB — DIFFERENTIAL
Basophils Absolute: 0 10*3/uL (ref 0.0–0.1)
Basophils Relative: 1 %
Eosinophils Absolute: 0.1 10*3/uL (ref 0.0–0.7)
Eosinophils Relative: 1 %
Lymphocytes Relative: 46 %
Lymphs Abs: 2.9 10*3/uL (ref 0.7–4.0)
Monocytes Absolute: 0.3 10*3/uL (ref 0.1–1.0)
Monocytes Relative: 5 %
Neutro Abs: 3 10*3/uL (ref 1.7–7.7)
Neutrophils Relative %: 47 %

## 2017-03-02 LAB — PROTIME-INR
INR: 0.96
Prothrombin Time: 12.7 s (ref 11.4–15.2)

## 2017-03-02 LAB — I-STAT TROPONIN, ED: Troponin i, poc: 0 ng/mL (ref 0.00–0.08)

## 2017-03-02 LAB — APTT: aPTT: 31 seconds (ref 24–36)

## 2017-03-02 NOTE — ED Provider Notes (Signed)
Patient seen/examined in the Emergency Department in conjunction with Midlevel Provider [facility] Patient reports right-sided numbness Exam : Awake alert no distress, no arm or leg drift noted Plan - probable admission per neuro recommendations    Ripley Fraise, MD 03/02/17 2357

## 2017-03-03 ENCOUNTER — Observation Stay (HOSPITAL_BASED_OUTPATIENT_CLINIC_OR_DEPARTMENT_OTHER): Payer: BC Managed Care – PPO

## 2017-03-03 ENCOUNTER — Observation Stay (HOSPITAL_COMMUNITY): Payer: BC Managed Care – PPO

## 2017-03-03 ENCOUNTER — Encounter (HOSPITAL_COMMUNITY): Payer: Self-pay | Admitting: *Deleted

## 2017-03-03 DIAGNOSIS — I361 Nonrheumatic tricuspid (valve) insufficiency: Secondary | ICD-10-CM | POA: Diagnosis not present

## 2017-03-03 DIAGNOSIS — I10 Essential (primary) hypertension: Secondary | ICD-10-CM | POA: Diagnosis not present

## 2017-03-03 DIAGNOSIS — R200 Anesthesia of skin: Secondary | ICD-10-CM

## 2017-03-03 DIAGNOSIS — E118 Type 2 diabetes mellitus with unspecified complications: Secondary | ICD-10-CM | POA: Diagnosis not present

## 2017-03-03 DIAGNOSIS — Z794 Long term (current) use of insulin: Secondary | ICD-10-CM

## 2017-03-03 DIAGNOSIS — I6330 Cerebral infarction due to thrombosis of unspecified cerebral artery: Secondary | ICD-10-CM

## 2017-03-03 LAB — COMPREHENSIVE METABOLIC PANEL
ALT: 97 U/L — ABNORMAL HIGH (ref 14–54)
AST: 69 U/L — ABNORMAL HIGH (ref 15–41)
Albumin: 3.5 g/dL (ref 3.5–5.0)
Alkaline Phosphatase: 108 U/L (ref 38–126)
Anion gap: 8 (ref 5–15)
BUN: 7 mg/dL (ref 6–20)
CO2: 29 mmol/L (ref 22–32)
Calcium: 8.7 mg/dL — ABNORMAL LOW (ref 8.9–10.3)
Chloride: 94 mmol/L — ABNORMAL LOW (ref 101–111)
Creatinine, Ser: 0.68 mg/dL (ref 0.44–1.00)
GFR calc Af Amer: >60 mL/min (ref >60)
GFR calc non Af Amer: >60 mL/min (ref >60)
Glucose, Bld: 450 mg/dL — ABNORMAL HIGH (ref 65–99)
Potassium: 3.3 mmol/L — ABNORMAL LOW (ref 3.5–5.1)
Sodium: 131 mmol/L — ABNORMAL LOW (ref 135–145)
Total Bilirubin: 0.7 mg/dL (ref 0.3–1.2)
Total Protein: 6.8 g/dL (ref 6.5–8.1)

## 2017-03-03 LAB — CBG MONITORING, ED
Glucose-Capillary: 371 mg/dL — ABNORMAL HIGH (ref 65–99)
Glucose-Capillary: 292 mg/dL — ABNORMAL HIGH (ref 65–99)
Glucose-Capillary: 219 mg/dL — ABNORMAL HIGH (ref 65–99)
Glucose-Capillary: 329 mg/dL — ABNORMAL HIGH (ref 65–99)
Glucose-Capillary: 212 mg/dL — ABNORMAL HIGH (ref 65–99)

## 2017-03-03 LAB — URINALYSIS, ROUTINE W REFLEX MICROSCOPIC
Bacteria, UA: NONE SEEN
Bilirubin Urine: NEGATIVE
Glucose, UA: >=500 mg/dL — AB
Hgb urine dipstick: NEGATIVE
Ketones, ur: NEGATIVE mg/dL
Leukocytes, UA: NEGATIVE
Nitrite: NEGATIVE
Protein, ur: NEGATIVE mg/dL
Specific Gravity, Urine: 1.02 (ref 1.005–1.030)
pH: 7 (ref 5.0–8.0)

## 2017-03-03 LAB — ECHOCARDIOGRAM COMPLETE
Ao-asc: 34 cm
E decel time: 250 msec
E/e' ratio: 11.32
FS: 21 % — AB (ref 28–44)
Height: 62 in
IVS/LV PW RATIO, ED: 0.95
LA ID, A-P, ES: 50 mm
LA diam end sys: 50 mm
LA diam index: 2.01 cm/m2
LA vol A4C: 86.1 ml
LA vol index: 35.6 mL/m2
LA vol: 88.8 mL
LV E/e' medial: 11.32
LV E/e'average: 11.32
LV PW d: 11 mm — AB (ref 0.6–1.1)
LVOT area: 3.46 cm2
LVOT diameter: 21 mm
Lateral S' vel: 10.6 cm/s
MV Dec: 250
MV pk A vel: 82.9 m/s
MV pk E vel: 48 m/s
Reg peak vel: 228 cm/s
TAPSE: 22.9 mm
TDI e' medial: 4.24
TR max vel: 228 cm/s
Weight: 4656 oz

## 2017-03-03 LAB — HEMOGLOBIN A1C
Hgb A1c MFr Bld: 12.7 % — ABNORMAL HIGH (ref 4.8–5.6)
Mean Plasma Glucose: 317.79 mg/dL

## 2017-03-03 LAB — LIPID PANEL
Cholesterol: 188 mg/dL (ref 0–200)
HDL: 42 mg/dL (ref >40)
LDL Cholesterol: 126 mg/dL — ABNORMAL HIGH (ref 0–99)
Total CHOL/HDL Ratio: 4.5 RATIO
Triglycerides: 99 mg/dL (ref <150)
VLDL: 20 mg/dL (ref 0–40)

## 2017-03-03 LAB — RAPID URINE DRUG SCREEN, HOSP PERFORMED
Amphetamines: NOT DETECTED
Barbiturates: NOT DETECTED
Benzodiazepines: NOT DETECTED
Cocaine: NOT DETECTED
Opiates: NOT DETECTED
Tetrahydrocannabinol: NOT DETECTED

## 2017-03-03 LAB — HIV ANTIBODY (ROUTINE TESTING W REFLEX): HIV Screen 4th Generation wRfx: NONREACTIVE

## 2017-03-03 LAB — ETHANOL: Alcohol, Ethyl (B): <10 mg/dL (ref <10)

## 2017-03-03 MED ORDER — AZILSARTAN-CHLORTHALIDONE 40-25 MG PO TABS: 1.0000 | ORAL_TABLET | Freq: Every day | ORAL | 0 refills | Status: DC

## 2017-03-03 MED ORDER — LORAZEPAM 2 MG/ML IJ SOLN
0.5000 mg | Freq: Once | INTRAMUSCULAR | Status: AC | PRN
  Administered 2017-03-03: 0.5 mg via INTRAVENOUS
  Filled 2017-03-03: qty 1

## 2017-03-03 MED ORDER — SODIUM CHLORIDE 0.9 % IV SOLN
1.0000 g | Freq: Once | INTRAVENOUS | Status: AC
  Administered 2017-03-03: 1 g via INTRAVENOUS
  Filled 2017-03-03: qty 10

## 2017-03-03 MED ORDER — IRBESARTAN 300 MG PO TABS
300.0000 mg | ORAL_TABLET | Freq: Every day | ORAL | Status: DC
  Administered 2017-03-03: 300 mg via ORAL
  Filled 2017-03-03: qty 1

## 2017-03-03 MED ORDER — BASAGLAR KWIKPEN 100 UNIT/ML ~~LOC~~ SOPN: 50.0000 [IU] | PEN_INJECTOR | Freq: Every day | SUBCUTANEOUS | 0 refills | Status: DC

## 2017-03-03 MED ORDER — ACETAMINOPHEN 325 MG PO TABS: 650.0000 mg | ORAL_TABLET | ORAL | Status: DC | PRN

## 2017-03-03 MED ORDER — ACETAMINOPHEN 160 MG/5ML PO SOLN: 650.0000 mg | ORAL | Status: DC | PRN

## 2017-03-03 MED ORDER — DULAGLUTIDE 1.5 MG/0.5ML ~~LOC~~ SOAJ: SUBCUTANEOUS | 1 refills | Status: DC

## 2017-03-03 MED ORDER — POTASSIUM CHLORIDE CRYS ER 20 MEQ PO TBCR
20.0000 meq | EXTENDED_RELEASE_TABLET | Freq: Every day | ORAL | Status: DC
  Administered 2017-03-03: 20 meq via ORAL
  Filled 2017-03-03: qty 1

## 2017-03-03 MED ORDER — SODIUM CHLORIDE 0.9 % IV BOLUS (SEPSIS)
1000.0000 mL | Freq: Once | INTRAVENOUS | Status: AC
  Administered 2017-03-03: 1000 mL via INTRAVENOUS

## 2017-03-03 MED ORDER — ACETAMINOPHEN 650 MG RE SUPP: 650.0000 mg | RECTAL | Status: DC | PRN

## 2017-03-03 MED ORDER — ASPIRIN 325 MG PO TBEC: 325.0000 mg | DELAYED_RELEASE_TABLET | Freq: Every day | ORAL | 0 refills | Status: DC

## 2017-03-03 MED ORDER — SODIUM CHLORIDE 0.9 % IV SOLN
INTRAVENOUS | Status: DC
  Administered 2017-03-03: 04:00:00 via INTRAVENOUS

## 2017-03-03 MED ORDER — INSULIN GLARGINE 100 UNIT/ML ~~LOC~~ SOLN
25.0000 [IU] | SUBCUTANEOUS | Status: AC
  Administered 2017-03-03: 25 [IU] via SUBCUTANEOUS
  Filled 2017-03-03: qty 0.25

## 2017-03-03 MED ORDER — INSULIN ASPART 100 UNIT/ML ~~LOC~~ SOLN
0.0000 [IU] | SUBCUTANEOUS | Status: DC
Start: 2017-03-03 — End: 2017-03-03
  Administered 2017-03-03: 11 [IU] via SUBCUTANEOUS
  Administered 2017-03-03: 5 [IU] via SUBCUTANEOUS
  Administered 2017-03-03: 15 [IU] via SUBCUTANEOUS
  Filled 2017-03-03 (×4): qty 1

## 2017-03-03 MED ORDER — ATORVASTATIN CALCIUM 40 MG PO TABS: 40.0000 mg | ORAL_TABLET | Freq: Every day | ORAL | 0 refills | Status: DC

## 2017-03-03 MED ORDER — ENOXAPARIN SODIUM 40 MG/0.4ML ~~LOC~~ SOLN
40.0000 mg | SUBCUTANEOUS | Status: DC
  Administered 2017-03-03: 40 mg via SUBCUTANEOUS
  Filled 2017-03-03 (×2): qty 0.4

## 2017-03-03 MED ORDER — SENNOSIDES-DOCUSATE SODIUM 8.6-50 MG PO TABS: 1.0000 | ORAL_TABLET | Freq: Every evening | ORAL | Status: DC | PRN

## 2017-03-03 MED ORDER — BASAGLAR KWIKPEN 100 UNIT/ML ~~LOC~~ SOPN: 50.0000 [IU] | PEN_INJECTOR | Freq: Every day | SUBCUTANEOUS | Status: DC

## 2017-03-03 MED ORDER — INSULIN GLARGINE 100 UNIT/ML ~~LOC~~ SOLN
50.0000 [IU] | Freq: Every day | SUBCUTANEOUS | Status: DC
  Filled 2017-03-03: qty 0.5

## 2017-03-03 MED ORDER — GABAPENTIN 100 MG PO CAPS: 100.0000 mg | ORAL_CAPSULE | Freq: Two times a day (BID) | ORAL | Status: DC

## 2017-03-03 MED ORDER — CHLORTHALIDONE 25 MG PO TABS
25.0000 mg | ORAL_TABLET | Freq: Every day | ORAL | Status: DC
  Administered 2017-03-03: 25 mg via ORAL
  Filled 2017-03-03 (×2): qty 1

## 2017-03-03 MED ORDER — ATORVASTATIN CALCIUM 40 MG PO TABS: 40.0000 mg | ORAL_TABLET | Freq: Every day | ORAL | Status: DC

## 2017-03-03 MED ORDER — STROKE: EARLY STAGES OF RECOVERY BOOK
Freq: Once | Status: AC
  Administered 2017-03-03: 03:00:00
  Filled 2017-03-03: qty 1

## 2017-03-03 MED ORDER — CONTINUOUS BLOOD GLUC SENSOR MISC: 1.0000 | 0 refills | Status: DC

## 2017-03-03 MED ORDER — ASPIRIN EC 325 MG PO TBEC
325.0000 mg | DELAYED_RELEASE_TABLET | Freq: Every day | ORAL | Status: DC
  Administered 2017-03-03: 325 mg via ORAL
  Filled 2017-03-03: qty 1

## 2017-03-03 MED ORDER — POTASSIUM CHLORIDE CRYS ER 20 MEQ PO TBCR
40.0000 meq | EXTENDED_RELEASE_TABLET | ORAL | Status: AC
  Administered 2017-03-03: 40 meq via ORAL
  Filled 2017-03-03: qty 2

## 2017-03-03 MED ORDER — GABAPENTIN 100 MG PO CAPS: 100.0000 mg | ORAL_CAPSULE | Freq: Two times a day (BID) | ORAL | 0 refills | Status: DC

## 2017-03-03 MED ORDER — AZILSARTAN-CHLORTHALIDONE 40-25 MG PO TABS: 1.0000 | ORAL_TABLET | Freq: Every day | ORAL | Status: DC

## 2017-03-03 NOTE — Progress Notes (Signed)
  Echocardiogram 2D Echocardiogram has been performed.  Cassie Shedlock G Donne Baley 03/03/2017, 1:39 PM

## 2017-03-03 NOTE — Progress Notes (Signed)
*  PRELIMINARY RESULTS* Vascular Ultrasound Carotid Duplex (Doppler) has been completed.  Preliminary findings: Bilateral 1-39% ICA stenosis, antegrade vertebral flow.   Everrett Coombe 03/03/2017, 2:39 PM

## 2017-03-03 NOTE — ED Notes (Signed)
2nd Breakfast Tray ordered at 1002.

## 2017-03-03 NOTE — ED Notes (Signed)
Pt given breakfast.

## 2017-03-03 NOTE — Progress Notes (Signed)
NEUROHOSPITALISTS STROKE TEAM - DAILY PROGRESS NOTE   ADMISSION HISTORY: Lindsay Gomez is an 46 y.o. female with past medical history of hypertension, diabetes mellitus, obesity who presents with sudden onset numbness and tingling on the right side of her face arm and leg  around 9:30 PM. EMS was called and patient noted to have right-sided numbness. No weakness, clumsiness noted. Blood pressure was 938 systolic and fingerstick glucose 490. Patient denies history of TIA stroke in the past. She is on blood pressure medications. She is not on aspirin at home.  Date last known well: 12.18.18 Time last known well: 9.30 pm tPA Given: no. Mild non disabling symptoms  NIHSS: 1 Baseline MRS 0  SUBJECTIVE (INTERVAL HISTORY) No family is at the bedside. Patient is found laying in bed in NAD. Overall she feels her condition is gradually improving. Voices no new complaints, but continues to complain of right hand pain and numbness. No new events reported overnight.  OBJECTIVE Lab Results: CBC:  Recent Labs  Lab 03/02/17 2324 03/02/17 2339  WBC 6.3  --   HGB 15.4* 17.3*  HCT 45.8 51.0*  MCV 86.6  --   PLT 205  --    BMP: Recent Labs  Lab 03/02/17 2324 03/02/17 2339  NA 131* 136  K 3.3* 3.3*  CL 94* 90*  CO2 29  --   GLUCOSE 450* 467*  BUN 7 7  CREATININE 0.68 0.60  CALCIUM 8.7*  --    Liver Function Tests:  Recent Labs  Lab 03/02/17 2324  AST 69*  ALT 97*  ALKPHOS 108  BILITOT 0.7  PROT 6.8  ALBUMIN 3.5   Coagulation Studies:  Recent Labs    03/02/17 2324  APTT 31  INR 0.96   Urinalysis:  Recent Labs  Lab 03/03/17 0106  COLORURINE STRAW*  APPEARANCEUR CLEAR  LABSPEC 1.020  PHURINE 7.0  GLUCOSEU >=500*  HGBUR NEGATIVE  BILIRUBINUR NEGATIVE  KETONESUR NEGATIVE  PROTEINUR NEGATIVE  NITRITE NEGATIVE  LEUKOCYTESUR NEGATIVE   PHYSICAL EXAM Temp:  [98.4 F (36.9 C)-98.7 F (37.1 C)] 98.4 F (36.9 C)  (12/19 1312) Pulse Rate:  [80-97] 88 (12/19 1200) Resp:  [14-29] 29 (12/19 1200) BP: (145-170)/(84-108) 161/97 (12/19 0645) SpO2:  [94 %-99 %] 98 % (12/19 1200) Weight:  [132 kg (291 lb)] 132 kg (291 lb) (12/19 0226) General - Well nourished, well developed, in no apparent distress Respiratory - Lungs clear bilaterally. No wheezing. Cardiovascular - Regular rate and rhythm  Neurological Examination Mental Status: Alert, oriented, thought content appropriate.  Speech fluent without evidence of aphasia. Able to follow 3 step commands without difficulty. Cranial Nerves: II: Visual fields grossly normal,  III,IV, VI: ptosis not present, extra-ocular motions intact bilaterally, pupils equal, round, reactive to light and accommodation V,VII: smile symmetric, facial light touch sensation normal bilaterally VIII: hearing normal bilaterally IX,X: uvula rises symmetrically XI: bilateral shoulder shrug XII: midline tongue extension Motor: Right :  Upper extremity   5/5                                      Left:     Upper extremity   5/5             Lower extremity   5/5  Lower extremity   5/5 Tone and bulk:normal tone throughout; no atrophy noted Sensory: Reduced sensation to light touch, temperature over right side of face arm and leg Deep Tendon Reflexes: 2+ and symmetric throughout Plantars: Right: downgoing                                Left: downgoing Cerebellar: normal finger-to-nose, normal rapid alternating movements and normal heel-to-shin test Gait: normal gait and station  IMAGING: I have personally reviewed the radiological images below and agree with the radiology interpretations.  MRI/ MRA Head Wo Contrast Result Date: 03/03/2017 IMPRESSION: Subcentimeter acute infarction in the left lateral thalamus. 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  Electronically Signed   By: Nelson Chimes M.D.   On: 03/03/2017 09:53   Ct Head Code Stroke Wo Contrast Result Date: 03/02/2017 IMPRESSION: 1. No acute hemorrhage. 2. 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 3. ASPECTS is 10. These results were communicated to Dr. Karena Addison Aroor at 11:50 pm on 03/02/2017 by text page via the [facility] messaging system. Electronically Signed   By: Ulyses Jarred M.D.   On: 03/02/2017 23:50   Echocardiogram: Study Conclusions - Left ventricle: The cavity size was normal. Systolic function was   normal. The estimated ejection fraction was in the range of 60%   to 65%. Wall motion was normal; there were no regional wall   motion abnormalities. Doppler parameters are consistent with   abnormal left ventricular relaxation (grade 1 diastolic   dysfunction). - Left atrium: The atrium was moderately dilated. - Atrial septum: No defect or patent foramen ovale was identified  B/L Carotid U/S:                                                Bilateral 1-39% ICA stenosis, antegrade vertebral flow.     ASSESSMENT: [name] is a 46 y.o. female with PMH of hypertension, diabetes mellitus, obesity who presents with sudden onset numbness and tingling on the right side of her face arm and leg.  MRI reveals:  Subcentimeter acute infarction in the left lateral thalamus  STROKE:  Suspected Etiology: Small vessel disease Resultant Symptoms: Right hand numbness Stroke Risk Factors: diabetes mellitus, hyperlipidemia and hypertension Other Stroke Risk Factors: ETOH use, Obesity, Body mass index is 53.22 kg/m., Obstructive sleep apnea, noncompliant with CPAP at home  Outstanding Stroke Work-up Studies:    Workup completed  03/03/2017: Neuro exam remained stable.  Patient continues to complain of right hand pain and numbness.  Trial of gabapentin to be prescribed.  All imaging and laboratory results reviewed with patient.  Counseled on  lifestyle modifications.  Continue aspirin and statin for now.  Patient will follow up with outpatient neurology in 6 weeks.  Will need close PCP follow-up to evaluate LFT's.  PLAN  03/03/2017: Continue Aspirin/ Statin Gabapentin 100 mg twice daily for right hand pain and numbness Per notes patient has left the hospital AMA since examination Consult Case Management /MSW-for PCP Ongoing aggressive stroke risk factor management Patient counseled to be compliant with her antithrombotic medications Patient counseled on Lifestyle modifications including, Diet, Exercise, and Stress Follow up with [facility] in 6 weeks  HX OF  STROKES: Old bilateral basal ganglia infarctions right larger than left.  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 Long term BP goal normotensive. May slowly restart home B/P medications after 48 hours Home Meds: Edarbyclor  HYPERLIPIDEMIA:    Component Value Date/Time   CHOL 188 03/03/2017 0301   TRIG 99 03/03/2017 0301   HDL 42 03/03/2017 0301   CHOLHDL 4.5 03/03/2017 0301   VLDL 20 03/03/2017 0301   LDLCALC 126 (H) 03/03/2017 0301  Home Meds:  NONE LDL  goal < 70 Started on  Lipitor to 40 mg daily Continue statin at discharge with close PCP follow-up and repeat LFT testing  DIABETES: Lab Results  Component Value Date   HGBA1C 12.7 (H) 03/03/2017  HgbA1c goal < 7.0 Currently on: NovoLog Continue CBG monitoring and SSI to maintain glucose 140-180 mg/dl DM education   OBESITY Obesity, Body mass index is 53.22 kg/m. Greater than/equal to 30  Other Active Problems: Principal Problem:   Numbness Active  Problems:   Morbid obesity ([place])   Essential hypertension   Type 2 diabetes mellitus (HCC)   Hypocalcemia   Cerebral thrombosis with cerebral infarction  Hospital day # 1 VTE prophylaxis: Lovenox  Diet : Diet Carb Modified Fluid consistency: Thin; Room service appropriate? Yes Diet - low sodium heart healthy   FAMILY UPDATES: No family at bedside  TEAM UPDATES: Elmarie Shiley, MD     Prior Home Stroke Medications:  No antithrombotic  Discharge Stroke Meds:  Please discharge patient on aspirin 325 mg daily   Disposition: 07-Left Against Medical Advice/Left Without Being Seen/Elopement Therapy Recs:               None Home Equipment:         none Follow Up:  Follow-up Information    Dennie Bible, NP. Schedule an appointment as soon as possible for a visit in 6 week(s).   Specialty:  Family Medicine Contact information: [address] [place] [address] [phone]          No primary care provider on file.    Case Management aware of need  Renie Ora Stroke Neurology Team 03/03/2017 5:11 PM  ATTENDING NOTE: I reviewed above note and agree with the assessment and plan. I have made any additions or clarifications directly to the above note. Pt was seen and examined.   46 year old female with history of diabetes, hypertension, obesity admitted for right-sided numbness.  Glucose was 490 on presentation, and BP at 170s.  MRI showed small acute left thalamic infarct, as well as old bilateral BG infarcts.  MRI showed chronic right M2 occlusion.  Carotid Doppler negative, EF 60-65%.  LDL 126 and A1c 12.7.  LFT elevated.  Patient stroke likely due to small vessel disease with stroke risk factor including uncontrolled diabetes, hypertension and obesity.  Put on aspirin and Lipitor for stroke prevention.  Patient complaint of right arm achy pain, started gabapentin 100 mg twice daily for symptomatic control.  Neurology will sign off.  Please call with questions. Pt will follow up with Cecille Rubin, NP, at [facility] in about 6 weeks. Thanks for the consult.  Rosalin Hawking, MD PhD Stroke Neurology 03/03/2017 6:33 PM   To contact Stroke Continuity provider,  please refer to [email or web address] After hours, contact General Neurology

## 2017-03-03 NOTE — ED Notes (Signed)
Carb Modified lunch tray ordered.  

## 2017-03-03 NOTE — ED Notes (Signed)
Spoke to Dr. Tamala Julian to verify that pt could eat as told earlier.  He verified and requested RN change diet to carb modified, which was done.  RN ordered breakfast

## 2017-03-03 NOTE — ED Notes (Signed)
Tamala Julian Hospitalist MD at bedside.

## 2017-03-03 NOTE — Progress Notes (Signed)
Patient has signed and been given copy of consent for CGM/Freestyle [place] research study. MD also notified.  Education done regarding application and changing CGM sensor (alternate every 10 days on back of arms), 12 hour warm-up, use of glucometer/where to buy strips, how to scan CGM for glucose reading and information for PCP. Patient has been given [employer] reader and first sensor for use. Patient has also been given educational packet regarding use CGM sensor including the 1-800 toll free number for any questions, problems or needs related to the [facility] sensors or reader.  Patient to be given Rx. For sensors with prescriptions/discharge paper work.  Sensor applied by patient to (R) Arm at (1545).  Explained that glucose readings will not be available until 12 hours after application. Patient understands that Diabetes coordinator will call them 2 times after discharge (between days 7-12 after d/c from hospital and between days 30-25 after d/c from hospital). Patient verbalizes understanding of use of Freestyle Libre CGM and was told that any issues with blood sugars/diabetes will need to be addressed by PCP.  Diabetes Quality of Life Survey administered to patient.   Adah Perl, RN, BC-ADM Inpatient Diabetes Coordinator Pager [phone] (8a-5p)

## 2017-03-03 NOTE — ED Notes (Signed)
Pt continues to refuse blood pressure cuff.

## 2017-03-03 NOTE — ED Notes (Signed)
Pt reports she was at home when her R side started to feel numb.  Reports numbness on the right side of her face, R arm and R leg.  Pt is A&Ox 4.  Able to move all of her extremities.

## 2017-03-03 NOTE — ED Notes (Signed)
Breakfast Tray received.

## 2017-03-03 NOTE — H&P (Signed)
History and Physical    Lindsay Gomez ZOX:[id number] DOB: March 12, 1971 DOA: 03/02/2017  Referring MD/NP/PA:Lisa Baird Cancer, PA-C PCP: Golden Circle, FNP  Patient coming from:   Chief Complaint:  numbness  I have personally briefly reviewed patient's old medical records in [place]   HPI: Lindsay Gomez is a 46 y.o. female with medical history significant of HTN, DM type 2 uncontrolled; who presents with complaints of acute onset of right-sided numbness from her head down to her feet at approximately 2130 last night.  Associated symptoms include urinary frequency, right thigh pain, and lower back pain.  She reports never having similar symptoms like this in the past.  Denies any focal weakness, fever, chills, nausea, vomiting, recent trauma, chest pain, or palpitations.  She notes that her last hemoglobin A1c was somewhere between 10 and 12.  She states that she does not routinely check her blood sugars as she does not like pricking her finger.  Currently, she is in need of a new PCP.  She also reports wanting to establish care with a new endocrinologist currently being seen by [facility] endocrinology.   ED Course: She was seen as a code stroke.  Neurology evaluated.  CT scan of the brain showed no acute hemorrhage chronic right caudate head infarction which is new compared to previous study CT. TPA was not administered.  Neurology recommended to [facility] for completion of stroke workup.  Review of Systems  Constitutional: Negative for chills, fever and weight loss.  HENT: Negative for ear discharge and nosebleeds.   Eyes: Negative for double vision and photophobia.  Respiratory: Negative for cough and shortness of breath.   Cardiovascular: Negative for chest pain and leg swelling.  Gastrointestinal: Negative for abdominal pain, diarrhea and vomiting.  Genitourinary: Positive for frequency.  Musculoskeletal: Positive for back pain and myalgias. Negative for falls.  Skin: Negative  for itching and rash.  Neurological: Positive for tingling and sensory change. Negative for focal weakness.  Endo/Heme/Allergies: Positive for polydipsia.  Psychiatric/Behavioral: Negative for hallucinations and substance abuse.    Past Medical History:  Diagnosis Date  . Allergy    rhinitis  . Anemia   . Anxiety   . Diabetes mellitus without complication ([place])   . Enlarged heart   . Hypertension   . Morbid obesity ([place])   . Shortness of breath dyspnea    with low iron    Past Surgical History:  Procedure Laterality Date  . ABDOMINAL HYSTERECTOMY N/A 01/29/2015   Procedure: TOTAL ABDOMINAL HYSTERECTOMY ;  Surgeon: Ena Dawley, MD;  Location: [facility];  Service: Gynecology;  Laterality: N/A;  . BILATERAL SALPINGECTOMY Bilateral 01/29/2015   Procedure: BILATERAL SALPINGECTOMY;  Surgeon: Ena Dawley, MD;  Location: [facility];  Service: Gynecology;  Laterality: Bilateral;  . CESAREAN SECTION    . KNEE SURGERY  1992   ? arthroscopic/ right     reports that  has never smoked. she has never used smokeless tobacco. She reports that she does not drink alcohol or use drugs.  Allergies  Allergen Reactions  . Lisinopril Other (See Comments)    Cough     Family History  Problem Relation Age of Onset  . Hypertension Mother   . Diabetes Mother   . Coronary artery disease Father   . Heart failure Father   . Heart attack Father   . Diabetes Father   . Hypertension Other   . Hyperlipidemia Other     Prior to Admission medications   Medication Sig  Start Date End Date Taking? Authorizing Provider  Azilsartan-Chlorthalidone (EDARBYCLOR) 40-25 MG TABS Take 1 tablet daily by mouth. 01/18/17  Yes Nche, Charlene Brooke, NP  Blood Glucose Monitoring Suppl (ONE TOUCH ULTRA MINI) w/Device KIT Use device to test blood sugars 1-4 times daily as instructed. 06/24/15  Yes Golden Circle, FNP  bromocriptine (PARLODEL) 2.5 MG tablet Take 1/4 tablet by mouth daily. 01/18/17  Yes Nche, Charlene Brooke, NP  Dulaglutide (TRULICITY) 1.5 KV/4.2VZ SOPN INJECT 0.5ML SUBCUTANEOUSLY ONCE A WEEK AS DIRECTED. 01/18/17  Yes Nche, Charlene Brooke, NP  glipiZIDE (GLIPIZIDE XL) 5 MG 24 hr tablet TAKE 1 TABLET WITH BREAKFAST. 01/18/17  Yes Nche, Charlene Brooke, NP  glucose blood test strip Use as instructed 06/24/15  Yes Golden Circle, FNP  Insulin Glargine (BASAGLAR KWIKPEN) 100 UNIT/ML SOPN Inject 0.4 mLs (40 Units total) daily at 10 pm into the skin. Patient taking differently: Inject 50 Units into the skin daily at 10 pm.  01/19/17  Yes Nche, Charlene Brooke, NP  Insulin Pen Needle (BD PEN NEEDLE NANO U/F) 32G X 4 MM MISC USE ONCE DAILY AS DIRECTED. 01/18/17  Yes Nche, Charlene Brooke, NP  Lancets MISC Use one lancet per test to check blood sugar 1-4 times daily. Dx code:E11.9 12/27/15  Yes Golden Circle, FNP  NON FORMULARY Take 1 capsule by mouth daily. [place] Supplement   Yes [provider]  potassium chloride SA (K-DUR,KLOR-CON) 20 MEQ tablet Take 1 tablet (20 mEq total) 2 (two) times daily by mouth. Patient taking differently: Take 20 mEq by mouth daily.  01/18/17  Yes Nche, Charlene Brooke, NP  meloxicam (MOBIC) 7.5 MG tablet Take 1 tablet (7.5 mg total) by mouth daily. Patient not taking: Reported on 03/03/2017 01/04/17   Nche, Charlene Brooke, NP    Physical Exam:  Constitutional: Obese female in NAD, calm, comfortable Vitals:   03/02/17 2357 03/03/17 0034  BP: (!) 170/108   Pulse: 95   Resp: (!) 21   Temp: 98.4 F (36.9 C) 98.7 F (37.1 C)  TempSrc: Oral   SpO2: 99%    Eyes: PERRL, lids and conjunctivae normal ENMT: Mucous membranes are moist. Posterior pharynx clear of any exudate or lesions.Normal dentition.  Neck: normal, supple, no masses, no thyromegaly Respiratory: clear to auscultation bilaterally, no wheezing, no crackles. Normal respiratory effort. No accessory muscle use.  Cardiovascular: Regular rate and rhythm, no murmurs / rubs / gallops. No extremity edema. 2+ pedal  pulses. No carotid bruits.  Abdomen: no tenderness, no masses palpated. No hepatosplenomegaly. Bowel sounds positive.  Musculoskeletal: no clubbing / cyanosis. No joint deformity upper and lower extremities. Good ROM, no contractures. Normal muscle tone.  Skin: no rashes, lesions, ulcers. No induration Neurologic: CN 2-12 grossly intact. Sensation abnormal, DTR normal. Strength 5/5 in all 4.  Psychiatric: Normal judgment and insight. Alert and oriented x 3. Normal mood.     Labs on Admission: I have personally reviewed following labs and imaging studies  CBC: Recent Labs  Lab 03/02/17 2324 03/02/17 2339  WBC 6.3  --   NEUTROABS 3.0  --   HGB 15.4* 17.3*  HCT 45.8 51.0*  MCV 86.6  --   PLT 205  --    Basic Metabolic Panel: Recent Labs  Lab 03/02/17 2324 03/02/17 2339  NA 131* 136  K 3.3* 3.3*  CL 94* 90*  CO2 29  --   GLUCOSE 450* 467*  BUN 7 7  CREATININE 0.68 0.60  CALCIUM 8.7*  --  GFR: CrCl cannot be calculated (Unknown ideal weight.). Liver Function Tests: Recent Labs  Lab 03/02/17 2324  AST 69*  ALT 97*  ALKPHOS 108  BILITOT 0.7  PROT 6.8  ALBUMIN 3.5   No results for input(s): LIPASE, AMYLASE in the last 168 hours. No results for input(s): AMMONIA in the last 168 hours. Coagulation Profile: Recent Labs  Lab 03/02/17 2324  INR 0.96   Cardiac Enzymes: No results for input(s): CKTOTAL, CKMB, CKMBINDEX, TROPONINI in the last 168 hours. BNP (last 3 results) No results for input(s): PROBNP in the last 8760 hours. HbA1C: No results for input(s): HGBA1C in the last 72 hours. CBG: No results for input(s): GLUCAP in the last 168 hours. Lipid Profile: No results for input(s): CHOL, HDL, LDLCALC, TRIG, CHOLHDL, LDLDIRECT in the last 72 hours. Thyroid Function Tests: No results for input(s): TSH, T4TOTAL, FREET4, T3FREE, THYROIDAB in the last 72 hours. Anemia Panel: No results for input(s): VITAMINB12, FOLATE, FERRITIN, TIBC, IRON, RETICCTPCT in the  last 72 hours. Urine analysis:    Component Value Date/Time   COLORURINE STRAW (A) 03/03/2017 0106   APPEARANCEUR CLEAR 03/03/2017 0106   LABSPEC 1.020 03/03/2017 0106   PHURINE 7.0 03/03/2017 0106   GLUCOSEU >=500 (A) 03/03/2017 0106   GLUCOSEU NEGATIVE 11/18/2012 1625   HGBUR NEGATIVE 03/03/2017 0106   BILIRUBINUR NEGATIVE 03/03/2017 0106   BILIRUBINUR negative 06/17/2015 1534   KETONESUR NEGATIVE 03/03/2017 0106   PROTEINUR NEGATIVE 03/03/2017 0106   UROBILINOGEN negative 06/17/2015 1534   UROBILINOGEN 0.2 06/12/2013 1228   NITRITE NEGATIVE 03/03/2017 0106   LEUKOCYTESUR NEGATIVE 03/03/2017 0106   Sepsis Labs: No results found for this or any previous visit (from the past 240 hour(s)).   Radiological Exams on Admission: Ct Head Code Stroke Wo Contrast  Result Date: 03/02/2017 CLINICAL DATA:  Code stroke. Right-sided numbness. Last seen while 04/04/1928 EXAM: CT HEAD WITHOUT CONTRAST TECHNIQUE: Contiguous axial images were obtained from the base of the skull through the vertex without intravenous contrast. COMPARISON:  Head CT 03/24/2009 FINDINGS: Brain: No mass lesion or acute hemorrhage. No focal hypoattenuation of the basal ganglia or cortex to indicate acutely infarcted tissue. Old infarct of the right caudate head, new since 03/24/2009. No hydrocephalus or age advanced atrophy. Vascular: No hyperdense vessel. No advanced atherosclerotic calcification of the arteries at the skull base. Skull: Normal visualized skull base, calvarium and extracranial soft tissues. Sinuses/Orbits: No sinus fluid levels or advanced mucosal thickening. No mastoid effusion. Normal orbits. ASPECTS [facility] Stroke Program Early CT Score) - Ganglionic level infarction (caudate, lentiform nuclei, internal capsule, insula, M1-M3 cortex): 7 - Supraganglionic infarction (M4-M6 cortex): 3 Total score (0-10 with 10 being normal): 10 IMPRESSION: 1. No acute hemorrhage. 2. Chronic right caudate head infarction is new  compared to 03/24/2009. 3. ASPECTS is 10. These results were communicated to Dr. Karena Addison Aroor at 11:50 pm on 03/02/2017 by text page via the [facility] messaging system. Electronically Signed   By: Ulyses Jarred M.D.   On: 03/02/2017 23:50    EKG: Independently reviewed.  Sinus rhythm with left atrial abnormality and signs of LVH.  Assessment/Plan Numbness: Acute.  Patient presents with complaints of numbness down her right side from her head down to her feet. - Admit to telemetry bed - Stroke order set initiated - Neuro checks - Check  MRI/MRA head w/o contrast, Vas carotid U/S  - Check echocardiogram - Check Hemoglobin A1c and lipid panel in a.m. - ASA - Appreciate neurology consultative services, will follow-up  Diabetes mellitus type 2, uncontrolled: Patient presents with elevated blood glucose of 450 on admission.  Reports last hemoglobin A1c somewhere around 10-12. - Hypoglycemic protocols - Check hemoglobin A1c - Continue home dose of Lantus - CBGs every 4 hours with Moderate SSI  Hypokalemia: Acute.  Patient presents with a potassium of 3.3 on admission. - Give 40 mEq of potassium chloride - Continue regular scheduled potassium chloride  Essential hypertension - Continue Azilsartan-chlorthalidone   Hypocalcemia: acute.  Ionized calcium noted to be low at 1.1. - Give 1 g of calcium gluconate IV - Continue to monitor and replace as needed  Morbid obesity: BMI 53.2  DVT prophylaxis: Lovenox Code Status: Full Family Communication:  Disposition Plan: TBD  Consults called: Neurology  Admission status:Observation  Norval Morton MD [facility] Pager [phone]   If 7PM-7AM, please contact night-coverage [email or web address] Password [email or web address]  03/03/2017, 2:23 AM

## 2017-03-03 NOTE — Progress Notes (Signed)
Spoke with Dr. Tyrell Antonio regarding Freestyle Libre study.  Patient is a candidate for [employer] study.  MD gave verbal order for patient to have 30 day prescription for Freestyle Libre 10 day sensors.  Patient to d/c today.   Thanks, Adah Perl, RN, BC-ADM Inpatient Diabetes Coordinator Pager [phone] (8a-5p)

## 2017-03-03 NOTE — ED Notes (Signed)
Pt refused to leave bp cuff on.  RN requested that pt please leave it on so that we can monitor blood pressure.

## 2017-03-03 NOTE — ED Notes (Signed)
Family at bedside. 

## 2017-03-03 NOTE — Discharge Summary (Signed)
Physician Discharge Summary  Lindsay Gomez CBS:[id number] DOB: May 31, 1970 DOA: 03/02/2017  PCP: No primary care provider on file.  Admit date: 03/02/2017 Discharge date: 03/03/2017  Admitted From: Home  Disposition:  Home   Recommendations for Outpatient Follow-up:  1. Follow up with PCP in 1-2 weeks 2. Please obtain BMP/CBC in one week 3. Needs further adjustment of medications for risk factors modification.     Discharge Condition: stable.  CODE STATUS: full code.  Diet recommendation: Carb Modified   Brief/Interim Summary:   1-Acute stroke, left thalamic infarct.  Presents with numbness right side.  MRI positive for stroke.  Carotid doppler 1-39 % plaque.  ECHO no evidence of thrombus, diastolic dysfunction.  LDL 126. Started on lipitor.  Hb A1c at 12. Resume meds.  PT, OT prior to discharge.  Discharge on Aspirin.   2-Diabetes; uncontrolled, hyperglycemia. No complaints. She doesn't take lantus twice in the week. She is not taking oral meds as prescribes.  Continue with Lantus, glipizide, trulicity    3-HTN;  Resume Edarbyclor.  Needs further adjustment of medications.   4-Hypokalemia; replete.   5-Morbid obesity; counseling.   Discharge Diagnoses:  Principal Problem:   Numbness Active Problems:   Morbid obesity ([place])   Essential hypertension   Type 2 diabetes mellitus ([place])   Hypocalcemia   Cerebral thrombosis with cerebral infarction    Discharge Instructions  Discharge Instructions    Ambulatory referral to Neurology   Complete by:  As directed    An appointment is requested in approximately:  6weeks Follow up with stroke clinic Cecille Rubin preferred, if not available, then consider Caesar Chestnut, [facility] or Jaynee Eagles whoever is available) at [facility] in about 6-8 weeks. Thanks.   Diet - low sodium heart healthy   Complete by:  As directed    Increase activity slowly   Complete by:  As directed      Allergies as of 03/03/2017      Reactions    Lisinopril Other (See Comments)   Cough       Medication List    STOP taking these medications   meloxicam 7.5 MG tablet Commonly known as:  MOBIC   NON FORMULARY     TAKE these medications   aspirin 325 MG EC tablet Take 1 tablet (325 mg total) by mouth daily. Start taking on:  03/04/2017   atorvastatin 40 MG tablet Commonly known as:  LIPITOR Take 1 tablet (40 mg total) by mouth daily at 6 PM.   Azilsartan-Chlorthalidone 40-25 MG Tabs Commonly known as:  EDARBYCLOR Take 1 tablet by mouth daily.   BASAGLAR KWIKPEN 100 UNIT/ML Sopn Inject 0.5 mLs (50 Units total) into the skin daily at 10 pm.   bromocriptine 2.5 MG tablet Commonly known as:  PARLODEL Take 1/4 tablet by mouth daily.   Continuous Blood Gluc Sensor Misc 1 each by Does not apply route as directed. Use as directed every 10 days. May dispense FreeStyle [employer] or similar. Patient has reader and will need prescription for 3-day sensors   Dulaglutide 1.5 MG/0.5ML Sopn Commonly known as:  TRULICITY INJECT 8.4YK SUBCUTANEOUSLY ONCE A WEEK AS DIRECTED.   gabapentin 100 MG capsule Commonly known as:  NEURONTIN Take 1 capsule (100 mg total) by mouth 2 (two) times daily.   glipiZIDE 5 MG 24 hr tablet Commonly known as:  GLIPIZIDE XL TAKE 1 TABLET WITH BREAKFAST.   glucose blood test strip Use as instructed   Insulin Pen Needle 32G X 4 MM  Misc Commonly known as:  BD PEN NEEDLE NANO U/F USE ONCE DAILY AS DIRECTED.   Lancets Misc Use one lancet per test to check blood sugar 1-4 times daily. Dx code:E11.9   ONE TOUCH ULTRA MINI w/Device Kit Use device to test blood sugars 1-4 times daily as instructed.   potassium chloride SA 20 MEQ tablet Commonly known as:  K-DUR,KLOR-CON Take 1 tablet (20 mEq total) 2 (two) times daily by mouth. What changed:  when to take this      Follow-up Information    Dennie Bible, NP. Schedule an appointment as soon as possible for a visit in 6  week(s).   Specialty:  Family Medicine Contact information: [address] [place] [address] [phone]          Allergies  Allergen Reactions  . Lisinopril Other (See Comments)    Cough     Consultations:  Neurology    Procedures/Studies: Mr Brain Wo Contrast  Result Date: 03/03/2017 CLINICAL DATA:  Numbness and tingling. Paresthesia. Right-sided numbness. Possible stroke. EXAM: MRI HEAD WITHOUT CONTRAST MRA HEAD WITHOUT CONTRAST TECHNIQUE: Multiplanar, multiecho pulse sequences of the brain and surrounding structures were obtained without intravenous contrast. Angiographic images of the head were obtained using MRA technique without contrast. COMPARISON:  CT 03/02/2017 FINDINGS: MRI HEAD FINDINGS Brain: Diffusion imaging shows a subcentimeter acute infarction in the left lateral thalamus. No other acute infarction. Elsewhere, there is old infarction in the right basal ganglia, left basal ganglia and minimal small vessel change of the white matter. No large vessel territory infarction. No mass lesion, acute hemorrhage, hydrocephalus or extra-axial collection. Hemosiderin deposition in the old basal ganglia infarctions. Vascular: Major vessels at the base of the brain show flow. Skull and upper cervical spine: Negative Sinuses/Orbits: Clear/ normal Other: None MRA HEAD FINDINGS Both internal carotid arteries are patent through the skullbase and siphon regions. On the left, the anterior and middle cerebral vessels are patent. 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 Both vertebral arteries are patent to the basilar. No basilar stenosis. Posterior circulation branch vessels appear normal. IMPRESSION: Subcentimeter acute infarction in the left lateral thalamus. 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 Electronically Signed   By: Nelson Chimes M.D.   On:  03/03/2017 09:53   Mr Jodene Nam Head Wo Contrast  Result Date: 03/03/2017 CLINICAL DATA:  Numbness and tingling. Paresthesia. Right-sided numbness. Possible stroke. EXAM: MRI HEAD WITHOUT CONTRAST MRA HEAD WITHOUT CONTRAST TECHNIQUE: Multiplanar, multiecho pulse sequences of the brain and surrounding structures were obtained without intravenous contrast. Angiographic images of the head were obtained using MRA technique without contrast. COMPARISON:  CT 03/02/2017 FINDINGS: MRI HEAD FINDINGS Brain: Diffusion imaging shows a subcentimeter acute infarction in the left lateral thalamus. No other acute infarction. Elsewhere, there is old infarction in the right basal ganglia, left basal ganglia and minimal small vessel change of the white matter. No large vessel territory infarction. No mass lesion, acute hemorrhage, hydrocephalus or extra-axial collection. Hemosiderin deposition in the old basal ganglia infarctions. Vascular: Major vessels at the base of the brain show flow. Skull and upper cervical spine: Negative Sinuses/Orbits: Clear/ normal Other: None MRA HEAD FINDINGS Both internal carotid arteries are patent through the skullbase and siphon regions. On the left, the anterior and middle cerebral vessels are patent. 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 Both vertebral arteries are patent  to the basilar. No basilar stenosis. Posterior circulation branch vessels appear normal. IMPRESSION: Subcentimeter acute infarction in the left lateral thalamus. 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 Electronically Signed   By: Nelson Chimes M.D.   On: 03/03/2017 09:53   Ct Head Code Stroke Wo Contrast  Result Date: 03/02/2017 CLINICAL DATA:  Code stroke. Right-sided numbness. Last seen while 04/04/1928 EXAM: CT HEAD WITHOUT CONTRAST TECHNIQUE: Contiguous axial images were obtained from the base of the  skull through the vertex without intravenous contrast. COMPARISON:  Head CT 03/24/2009 FINDINGS: Brain: No mass lesion or acute hemorrhage. No focal hypoattenuation of the basal ganglia or cortex to indicate acutely infarcted tissue. Old infarct of the right caudate head, new since 03/24/2009. No hydrocephalus or age advanced atrophy. Vascular: No hyperdense vessel. No advanced atherosclerotic calcification of the arteries at the skull base. Skull: Normal visualized skull base, calvarium and extracranial soft tissues. Sinuses/Orbits: No sinus fluid levels or advanced mucosal thickening. No mastoid effusion. Normal orbits. ASPECTS [facility] Stroke Program Early CT Score) - Ganglionic level infarction (caudate, lentiform nuclei, internal capsule, insula, M1-M3 cortex): 7 - Supraganglionic infarction (M4-M6 cortex): 3 Total score (0-10 with 10 being normal): 10 IMPRESSION: 1. No acute hemorrhage. 2. 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 3. ASPECTS is 10. These results were communicated to Dr. Karena Addison Aroor at 11:50 pm on 03/02/2017 by text page via the [facility] messaging system. Electronically Signed   By: Ulyses Jarred M.D.   On: 03/02/2017 23:50       Subjective: Numbness right side persist.  No weakness.   Discharge Exam: Vitals:   03/03/17 1200 03/03/17 1312  BP:    Pulse: 88   Resp: (!) 29   Temp:  98.4 F (36.9 C)  SpO2: 98%    Vitals:   03/03/17 1030 03/03/17 1100 03/03/17 1200 03/03/17 1312  BP:      Pulse: 92 97 88   Resp: 20 (!) 25 (!) 29   Temp:    98.4 F (36.9 C)  TempSrc:    Oral  SpO2: 98% 96% 98%   Weight:      Height:        General: Pt is alert, awake, not in acute distress Cardiovascular: RRR, S1/S2 +, no rubs, no gallops Respiratory: CTA bilaterally, no wheezing, no rhonchi Abdominal: Soft, NT, ND, bowel sounds + Extremities: no edema, no cyanosis    The results of significant diagnostics from this hospitalization (including  imaging, microbiology, ancillary and laboratory) are listed below for reference.     Microbiology: No results found for this or any previous visit (from the past 240 hour(s)).   Labs: BNP (last 3 results) No results for input(s): BNP in the last 8760 hours. Basic Metabolic Panel: Recent Labs  Lab 03/02/17 2324 03/02/17 2339  NA 131* 136  K 3.3* 3.3*  CL 94* 90*  CO2 29  --   GLUCOSE 450* 467*  BUN 7 7  CREATININE 0.68 0.60  CALCIUM 8.7*  --    Liver Function Tests: Recent Labs  Lab 03/02/17 2324  AST 69*  ALT 97*  ALKPHOS 108  BILITOT 0.7  PROT 6.8  ALBUMIN 3.5   No results for input(s): LIPASE, AMYLASE in the last 168 hours. No results for input(s): AMMONIA in the last 168 hours. CBC: Recent Labs  Lab 03/02/17 2324 03/02/17 2339  WBC 6.3  --   NEUTROABS  3.0  --   HGB 15.4* 17.3*  HCT 45.8 51.0*  MCV 86.6  --   PLT 205  --    Cardiac Enzymes: No results for input(s): CKTOTAL, CKMB, CKMBINDEX, TROPONINI in the last 168 hours. BNP: Invalid input(s): POCBNP CBG: Recent Labs  Lab 03/03/17 0355 03/03/17 0552 03/03/17 0904 03/03/17 1146  GLUCAP 371* 292* 219* 329*   D-Dimer No results for input(s): DDIMER in the last 72 hours. Hgb A1c Recent Labs    03/03/17 0301  HGBA1C 12.7*   Lipid Profile Recent Labs    03/03/17 0301  CHOL 188  HDL 42  LDLCALC 126*  TRIG 99  CHOLHDL 4.5   Thyroid function studies No results for input(s): TSH, T4TOTAL, T3FREE, THYROIDAB in the last 72 hours.  Invalid input(s): FREET3 Anemia work up No results for input(s): VITAMINB12, FOLATE, FERRITIN, TIBC, IRON, RETICCTPCT in the last 72 hours. Urinalysis    Component Value Date/Time   COLORURINE STRAW (A) 03/03/2017 0106   APPEARANCEUR CLEAR 03/03/2017 0106   LABSPEC 1.020 03/03/2017 0106   PHURINE 7.0 03/03/2017 0106   GLUCOSEU >=500 (A) 03/03/2017 0106   GLUCOSEU NEGATIVE 11/18/2012 1625   HGBUR NEGATIVE 03/03/2017 0106   BILIRUBINUR NEGATIVE 03/03/2017  0106   BILIRUBINUR negative 06/17/2015 1534   KETONESUR NEGATIVE 03/03/2017 0106   PROTEINUR NEGATIVE 03/03/2017 0106   UROBILINOGEN negative 06/17/2015 1534   UROBILINOGEN 0.2 06/12/2013 1228   NITRITE NEGATIVE 03/03/2017 0106   LEUKOCYTESUR NEGATIVE 03/03/2017 0106   Sepsis Labs Invalid input(s): PROCALCITONIN,  WBC,  LACTICIDVEN Microbiology No results found for this or any previous visit (from the past 240 hour(s)).   Time coordinating discharge: Over 30 minutes  SIGNED:   Elmarie Shiley, MD  [facility] 03/03/2017, 4:44 PM Pager   If 7PM-7AM, please contact night-coverage [email or web address] Password [email or web address]

## 2017-03-03 NOTE — ED Notes (Signed)
Carb lunch tray ordered at 1031

## 2017-03-03 NOTE — Evaluation (Addendum)
Physical Therapy Evaluation Patient Details Name: Lindsay Gomez MRN: [id number] DOB: [birth year] Today's Date: 03/03/2017   History of Present Illness  Pt is a 46 y/o female admitted secondary to R arm numbness. MRI revealed acute infarct in L lateral thalamus, and old bilat basal ganglia infarct and chronic occlusion of inferior R MCA. PMH includes DM, HTN, obesity, and anxiety.   Clinical Impression  Pt admitted secondary to problem above with deficits below. PTA, pt was independent with functional mobility. Upon eval, pt presenting with RUE numbness and decreased balance. Pt with LOB X 2 during dynamic gait tasks, however, able to recover independently. Educated about safety with mobility at home and use of cane, however, pt refusing at this time. Pt also complaining of R hip pain. Educated about outpatient PT recommendations to address balance deficits and R hip pain and pt agreeable. Will continue to follow acutely to maximize functional mobility independence and safety.     Follow Up Recommendations Outpatient PT    Equipment Recommendations  None recommended by PT(refused Cane )    Recommendations for Other Services       Precautions / Restrictions Precautions Precautions: Fall Restrictions Weight Bearing Restrictions: No      Mobility  Bed Mobility Overal bed mobility: Modified Independent             General bed mobility comments: Increased time, however, no assist required  Transfers Overall transfer level: Modified independent Equipment used: None             General transfer comment: Overall steady transfer.   Ambulation/Gait Ambulation/Gait assistance: Min guard;Supervision Ambulation Distance (Feet): 200 Feet Assistive device: None Gait Pattern/deviations: Step-through pattern;Decreased stride length;Staggering left;Staggering right Gait velocity: Decreased Gait velocity interpretation: Below normal speed for age/gender General Gait Details:  Slow, slightly unsteady gait. LOB X 2 during gait, however, able to self recover. Min guard to supervision for safety. Pt also complained of R hip pain. Educated about use of cane to increase safety with mobility and decrease pain, however, pt refusing at this time.   Stairs Stairs: Yes Stairs assistance: Min guard Stair Management: Two rails;Step to pattern Number of Stairs: 3 General stair comments: Verbal cues for sequencing with stair management. Min guard for safety throughout stair navigation. Educated about safe stair management at home and level of assist needed.   Wheelchair Mobility    Modified Rankin (Stroke Patients Only) Modified Rankin (Stroke Patients Only) Pre-Morbid Rankin Score: No symptoms Modified Rankin: Moderately severe disability     Balance Overall balance assessment: Needs assistance Sitting-balance support: No upper extremity supported;Feet supported Sitting balance-Leahy Scale: Good     Standing balance support: No upper extremity supported;During functional activity Standing balance-Leahy Scale: Fair                   Standardized Balance Assessment Standardized Balance Assessment : Dynamic Gait Index   Dynamic Gait Index Level Surface: Normal Gait with Horizontal Head Turns: Mild Impairment Gait with Vertical Head Turns: Mild Impairment Gait and Pivot Turn: Moderate Impairment Steps: Moderate Impairment       Pertinent Vitals/Pain Pain Assessment: 0-10 Pain Score: 8  Pain Location: R hip  Pain Descriptors / Indicators: Aching;Constant Pain Intervention(s): Limited activity within patient's tolerance;Repositioned;Monitored during session    Home Living Family/patient expects to be discharged to:: Private residence Living Arrangements: Alone Available Help at Discharge: Friend(s);Available PRN/intermittently Type of Home: House Home Access: Stairs to enter Entrance Stairs-Rails: None Entrance Stairs-Number of Steps: 1 Home  Layout: Two level Home Equipment: None      Prior Function Level of Independence: Independent               Hand Dominance        Extremity/Trunk Assessment   Upper Extremity Assessment Upper Extremity Assessment: RUE deficits/detail RUE Deficits / Details: RUE numbness reported  RUE Sensation: decreased light touch    Lower Extremity Assessment Lower Extremity Assessment: RLE deficits/detail RLE Deficits / Details: R hip pain at baseline.     Cervical / Trunk Assessment Cervical / Trunk Assessment: Normal  Communication   Communication: No difficulties  Cognition Arousal/Alertness: Awake/alert Behavior During Therapy: WFL for tasks assessed/performed Overall Cognitive Status: Within Functional Limits for tasks assessed                                        General Comments General comments (skin integrity, edema, etc.): Educated about safety with mobility at home and need for assist at home and pt agreeable. Educated about outpatient PT recommendations and pt agreeable.     Exercises     Assessment/Plan    PT Assessment Patient needs continued PT services  PT Problem List Decreased balance;Decreased mobility;Decreased knowledge of use of DME;Decreased knowledge of precautions;Pain       PT Treatment Interventions DME instruction;Gait training;Stair training;Functional mobility training;Therapeutic activities;Therapeutic exercise;Balance training;Neuromuscular re-education;Patient/family education    PT Goals (Current goals can be found in the Care Plan section)  Acute Rehab PT Goals Patient Stated Goal: to go home  PT Goal Formulation: With patient Time For Goal Achievement: 03/10/17 Potential to Achieve Goals: Good    Frequency Min 4X/week   Barriers to discharge Decreased caregiver support Reports friends will be checking in on her     Co-evaluation               AM-PAC PT "6 Clicks" Daily Activity  Outcome Measure  Difficulty turning over in bed (including adjusting bedclothes, sheets and blankets)?: None Difficulty moving from lying on back to sitting on the side of the bed? : A Little Difficulty sitting down on and standing up from a chair with arms (e.g., wheelchair, bedside commode, etc,.)?: A Little Help needed moving to and from a bed to chair (including a wheelchair)?: A Little Help needed walking in hospital room?: A Little Help needed climbing 3-5 steps with a railing? : A Little 6 Click Score: 19    End of Session Equipment Utilized During Treatment: Gait belt Activity Tolerance: Patient tolerated treatment well Patient left: in bed;with call bell/phone within reach Nurse Communication: Mobility status PT Visit Diagnosis: Unsteadiness on feet (R26.81);Pain Pain - Right/Left: Right Pain - part of body: Hip    Time: 7510-2585 PT Time Calculation (min) (ACUTE ONLY): 18 min   Charges:   PT Evaluation $PT Eval Low Complexity: 1 Low     PT G Codes:   PT G-Codes **NOT FOR INPATIENT CLASS** Functional Assessment Tool Used: AM-PAC 6 Clicks Basic Mobility Functional Limitation: Mobility: Walking and moving around Mobility: Walking and Moving Around Current Status (I7782): At least 20 percent but less than 40 percent impaired, limited or restricted Mobility: Walking and Moving Around Goal Status [phone]): At least 1 percent but less than 20 percent impaired, limited or restricted    Leighton Ruff, PT, DPT  Acute Rehabilitation Services  Pager: [place] 03/03/2017, 5:12 PM

## 2017-03-03 NOTE — Consult Note (Addendum)
Requesting Physician: Dr. Christy Gentles    Chief Complaint: Right-sided numbness  History obtained from: Patient and Chart     HPI:                                                                                                                                       Lindsay Gomez is an 46 y.o. female with past medical history of hypertension, diabetes mellitus, obesity who presents with sudden onset numbness and tingling on the right side of her face arm and leg  around 9:30 PM. EMS was called and patient noted to have right-sided numbness. No weakness, clumsiness noted. Blood pressure was 409 systolic and fingerstick glucose 490. Patient denies history of TIA stroke in the past. She is on blood pressure medications. She is not on aspirin at home.  Date last known well: 12.18.18 Time last known well: 9.30 pm tPA Given: no. Mild non disabling symptoms  NIHSS: 1 Baseline MRS 0   Past Medical History:  Diagnosis Date  . Allergy    rhinitis  . Anemia   . Anxiety   . Diabetes mellitus without complication ([place])   . Enlarged heart   . Hypertension   . Morbid obesity ([place])   . Shortness of breath dyspnea    with low iron    Past Surgical History:  Procedure Laterality Date  . ABDOMINAL HYSTERECTOMY N/A 01/29/2015   Procedure: TOTAL ABDOMINAL HYSTERECTOMY ;  Surgeon: Ena Dawley, MD;  Location: [facility];  Service: Gynecology;  Laterality: N/A;  . BILATERAL SALPINGECTOMY Bilateral 01/29/2015   Procedure: BILATERAL SALPINGECTOMY;  Surgeon: Ena Dawley, MD;  Location: [facility];  Service: Gynecology;  Laterality: Bilateral;  . CESAREAN SECTION    . KNEE SURGERY  1992   ? arthroscopic/ right    Family History  Problem Relation Age of Onset  . Hypertension Mother   . Diabetes Mother   . Coronary artery disease Father   . Heart failure Father   . Heart attack Father   . Diabetes Father   . Hypertension Other   . Hyperlipidemia Other    Social History:  reports that  has never  smoked. she has never used smokeless tobacco. She reports that she does not drink alcohol or use drugs.  Allergies:  Allergies  Allergen Reactions  . Lisinopril Other (See Comments)    Cough     Medications:  I reviewed home medications. Not on aspirin    ROS:                                                                                                                                     14 systems reviewed and negative except above    Examination:                                                                                                      General: Appears well-developed and well-nourished.  Psych: Affect appropriate to situation Eyes: No scleral injection HENT: No OP obstrucion Head: Normocephalic.  Cardiovascular: Normal rate and regular rhythm.  Respiratory: Effort normal and breath sounds normal to anterior ascultation GI: Soft.  No distension. There is no tenderness.  Skin: WDI   Neurological Examination Mental Status: Alert, oriented, thought content appropriate.  Speech fluent without evidence of aphasia. Able to follow 3 step commands without difficulty. Cranial Nerves: II: Visual fields grossly normal,  III,IV, VI: ptosis not present, extra-ocular motions intact bilaterally, pupils equal, round, reactive to light and accommodation V,VII: smile symmetric, facial light touch sensation normal bilaterally VIII: hearing normal bilaterally IX,X: uvula rises symmetrically XI: bilateral shoulder shrug XII: midline tongue extension Motor: Right : Upper extremity   5/5    Left:     Upper extremity   5/5  Lower extremity   5/5     Lower extremity   5/5 Tone and bulk:normal tone throughout; no atrophy noted Sensory: Reduced sensation to light touch, temperature over right side of face arm and leg Deep Tendon Reflexes: 2+ and symmetric  throughout Plantars: Right: downgoing   Left: downgoing Cerebellar: normal finger-to-nose, normal rapid alternating movements and normal heel-to-shin test Gait: normal gait and station     Lab Results: Basic Metabolic Panel: Recent Labs  Lab 03/02/17 2324 03/02/17 2339  NA 131* 136  K 3.3* 3.3*  CL 94* 90*  CO2 29  --   GLUCOSE 450* 467*  BUN 7 7  CREATININE 0.68 0.60  CALCIUM 8.7*  --     CBC: Recent Labs  Lab 03/02/17 2324 03/02/17 2339  WBC 6.3  --   NEUTROABS 3.0  --   HGB 15.4* 17.3*  HCT 45.8 51.0*  MCV 86.6  --   PLT 205  --     Coagulation Studies: Recent Labs    03/02/17 2324  LABPROT 12.7  INR 0.96    Imaging: Ct Head Code Stroke Wo Contrast  Result Date:  03/02/2017 CLINICAL DATA:  Code stroke. Right-sided numbness. Last seen while 04/04/1928 EXAM: CT HEAD WITHOUT CONTRAST TECHNIQUE: Contiguous axial images were obtained from the base of the skull through the vertex without intravenous contrast. COMPARISON:  Head CT 03/24/2009 FINDINGS: Brain: No mass lesion or acute hemorrhage. No focal hypoattenuation of the basal ganglia or cortex to indicate acutely infarcted tissue. Old infarct of the right caudate head, new since 03/24/2009. No hydrocephalus or age advanced atrophy. Vascular: No hyperdense vessel. No advanced atherosclerotic calcification of the arteries at the skull base. Skull: Normal visualized skull base, calvarium and extracranial soft tissues. Sinuses/Orbits: No sinus fluid levels or advanced mucosal thickening. No mastoid effusion. Normal orbits. ASPECTS [facility] Stroke Program Early CT Score) - Ganglionic level infarction (caudate, lentiform nuclei, internal capsule, insula, M1-M3 cortex): 7 - Supraganglionic infarction (M4-M6 cortex): 3 Total score (0-10 with 10 being normal): 10 IMPRESSION: 1. No acute hemorrhage. 2. 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 3. ASPECTS is 10. These results were communicated to Dr.  Karena Addison Jazmin Vensel at 11:50 pm on 03/02/2017 by text page via the [facility] messaging system. Electronically Signed   By: Ulyses Jarred M.D.   On: 03/02/2017 23:50     ASSESSMENT AND PLAN  46 y.o. female with past medical history of hypertension, diabetes mellitus, obesity who presents with sudden onset numbness and tingling on the right side of her face arm and leg cord around 9:30 PM. CT head showed no acute findings. Patient possibly has small stroke likely in the thalamus versus TIA. TPA was not administered as her symptoms were mild and nondisabling. CT angiogram was not obtained as patient is clinically negative for LVO.  Acute Ischemic Stroke   Risk factors hypertension, diabetes mellitus, obesity Etiology: Likely Small vessel  Recommend # MRI of the brain without contrast #MRA Head and neck  #Transthoracic Echo  # Start patient on ASA 325mg  daily #Start or continue Atorvastatin 80 mg/other high intensity statin # BP goal: permissive HTN upto 938 systolic, PRNs above 21 # HBAIC and Lipid profile # Telemetry monitoring # Frequent neuro checks # NPO until passes stroke swallow screen  Please page stroke NP  Or  PA  Or MD from 8am -4 pm  as this patient from this time will be  followed by the stroke.   You can look them up on [email or web address]  Password [facility]    Coyt Govoni [facility] Pager Number [phone]

## 2017-03-03 NOTE — ED Notes (Signed)
Pt transported to ECHO. 

## 2017-03-03 NOTE — ED Notes (Signed)
Pt to MRI

## 2017-03-03 NOTE — ED Notes (Signed)
Admitting paged 

## 2017-03-03 NOTE — ED Notes (Addendum)
Pt reports her R arm started tingling and is painful.  Denies same in her R leg or face.  Pt also reports L sided h/a, behind her L eye.  Rates it at 4/10.

## 2017-03-03 NOTE — ED Notes (Signed)
Will give pt ativan prior to MRI

## 2017-03-03 NOTE — Progress Notes (Signed)
Inpatient Diabetes Program Recommendations  AACE/ADA: New Consensus Statement on Inpatient Glycemic Control (2015)  Target Ranges:  Prepandial:   less than 140 mg/dL      Peak postprandial:   less than 180 mg/dL (1-2 hours)      Critically ill patients:  140 - 180 mg/dL   Results for Lindsay Gomez, Lindsay Gomez (MRN [id number]) as of 03/03/2017 14:15  Ref. Range 03/03/2017 03:55 03/03/2017 05:52 03/03/2017 09:04 03/03/2017 11:46  Glucose-Capillary Latest Ref Range: 65 - 99 mg/dL 371 (H) 292 (H) 219 (H) 329 (H)  Results for Lindsay Gomez, Lindsay Gomez (MRN [id number]) as of 03/03/2017 14:15  Ref. Range 03/03/2017 03:01  Hemoglobin A1C Latest Ref Range: 4.8 - 5.6 % 12.7 (H)   Review of Glycemic Control  Diabetes history: DM2 Outpatient Diabetes medications: Basaglar 50 units QHS, Trulicity 1.5 mg Qweek, Glipizide XL 5 mg QAM Current orders for Inpatient glycemic control: Lantus 50 units QHS, Novolog 0-15 units Q4H  Inpatient Diabetes Program Recommendations: HgbA1C: A1C 12.7% on 03/03/16 indicating an average glucose of 318 mg/dl over the past 2-3 months. Patient needs to follow up with PCP/ Endocrinologist for assistance with getting DM controlled.  NOTE: Spoke with patient about diabetes and home regimen for diabetes control. Patient reports that she is followed by PCP for diabetes management and currently she takes Basaglar 50 units QHS, Trulicity 1.5 mg Qweek, Glipizide XL 5 mg QAM as an outpatient for diabetes control. Patient reports that she is taking DM medications as prescribed and that she last seen PCP on 01/18/17. Patient reports that the Basaglar dose has been increased by the doctor over the past several weeks. Patient reports that she does not check her glucose consistently but when she does check it, it ranges from 250-400 mg/dl. Patient reports that she does not check glucose often because she does not like to prick her finger.  Inquired about prior A1C and patient reports that her last A1C value  was in the 12% range. Discussed A1C results (12.7% on 03/03/17) and explained that her current A1C indicates an average glucose of 318 mg/dl over the past 2-3 months. Discussed glucose and A1C goals. Discussed importance of checking CBGs and maintaining good CBG control to prevent long-term and short-term complications. Explained how hyperglycemia leads to damage within blood vessels which lead to the common complications seen with uncontrolled diabetes. Stressed to the patient the importance of improving glycemic control to prevent further complications from uncontrolled diabetes.  Encouraged patient to check her glucose 3-4 times per day (before meals and at bedtime) and to keep a log book of glucose readings and insulin taken which she will need to take to doctor appointments. Explained how her doctor can use the log book to continue to make insulin adjustments if needed.Patient states that she has asked about getting the FreeStyle [place] device and she keeps getting the run around. Informed patient about research study being done by Inpatient Diabetes team with the FreeStyle [place] sensor and reader device. Patient is interested in participating in the study. Informed patient that if she is discharged during the week when an Inpatient Diabetes Coordinator is on [facility] then our team can provide her with one FreeStyle [place] sensor and the reader device and will ask MD to provide a prescription for the FreeStyle [place] sensors (3) which she would need to have filled at her pharmacy.  Patient is not sure if she will be discharged today or tomorrow. Spoke with RN and she states it is not clear  yet if she will be discharged today or tomorrow. Patient verbalized understanding of information discussed and she states that she has no further questions at this time related to diabetes.  Thanks, Barnie Alderman, RN, MSN, CDE Diabetes Coordinator Inpatient Diabetes Program [phone] (Team Pager)

## 2017-03-03 NOTE — ED Provider Notes (Signed)
[facility] EMERGENCY DEPARTMENT Provider Note   CSN: [id number] Arrival date & time: 03/02/17  2332   An emergency department physician performed an initial assessment on this suspected stroke patient at 2331.  History   Chief Complaint No chief complaint on file.   HPI Lindsay Gomez is a 46 y.o. female.  The history is provided by the patient and medical records.    46 year old female with history of seasonal allergies, anemia,, presenting to the ED with right arm numbness that began at 2130.  States numbness is diffuse from right side of head/face all the way down to her feet.  No focal weakness, confusion, changes in speech, or other symptoms.  No significant headaches.  No prior hx of TIA or stroke. Not currently on anticoagulation.    Past Medical History:  Diagnosis Date  . Allergy    rhinitis  . Anemia   . Anxiety   . Diabetes mellitus without complication ([place])   . Enlarged heart   . Hypertension   . Morbid obesity ([place])   . Shortness of breath dyspnea    with low iron    Patient Active Problem List   Diagnosis Date Noted  . Major depressive disorder, single episode, moderate ([place]) 10/28/2015  . Microcytic hypochromic anemia 07/29/2015  . Type 2 diabetes mellitus ([place]) 06/24/2015  . Depression 06/24/2015  . Polyuria 06/17/2015  . Obstructive sleep apnea 06/17/2015  . Anxiety and depression 04/24/2015  . Pre-op evaluation 01/09/2015  . Hyperlipidemia LDL goal < 100 01/06/2013  . Right lumbar radiculopathy 10/26/2012  . ANA positive 09/14/2012  . LVH (left ventricular hypertrophy) 12/08/2011  . Menorrhagia 09/24/2011  . Routine general medical examination at a health care facility 09/24/2011  . Carpal tunnel syndrome of right wrist 09/18/2010  . Hypokalemia 09/18/2010  . Morbid obesity ([place]) 11/29/2008  . Iron deficiency anemia 11/28/2008  . Essential hypertension 11/28/2008  . ALLERGIC RHINITIS 11/28/2008    Past Surgical  History:  Procedure Laterality Date  . ABDOMINAL HYSTERECTOMY N/A 01/29/2015   Procedure: TOTAL ABDOMINAL HYSTERECTOMY ;  Surgeon: Ena Dawley, MD;  Location: [facility];  Service: Gynecology;  Laterality: N/A;  . BILATERAL SALPINGECTOMY Bilateral 01/29/2015   Procedure: BILATERAL SALPINGECTOMY;  Surgeon: Ena Dawley, MD;  Location: [facility];  Service: Gynecology;  Laterality: Bilateral;  . CESAREAN SECTION    . KNEE SURGERY  1992   ? arthroscopic/ right    OB History    Gravida Para Term Preterm AB Living   3 1     1 2    SAB TAB Ectopic Multiple Live Births   1               Home Medications    Prior to Admission medications   Medication Sig Start Date End Date Taking? Authorizing Provider  Azilsartan-Chlorthalidone (EDARBYCLOR) 40-25 MG TABS Take 1 tablet daily by mouth. 01/18/17   Nche, Charlene Brooke, NP  Blood Glucose Monitoring Suppl (ONE TOUCH ULTRA MINI) w/Device KIT Use device to test blood sugars 1-4 times daily as instructed. 06/24/15   Golden Circle, FNP  bromocriptine (PARLODEL) 2.5 MG tablet Take 1/4 tablet by mouth daily. 01/18/17   Nche, Charlene Brooke, NP  Dulaglutide (TRULICITY) 1.5 BB/0.4UG SOPN INJECT 0.5ML SUBCUTANEOUSLY ONCE A WEEK AS DIRECTED. 01/18/17   Nche, Charlene Brooke, NP  glipiZIDE (GLIPIZIDE XL) 5 MG 24 hr tablet TAKE 1 TABLET WITH BREAKFAST. 01/18/17   Nche, Charlene Brooke, NP  glucose blood test strip Use as  instructed 06/24/15   Golden Circle, FNP  Insulin Glargine (BASAGLAR KWIKPEN) 100 UNIT/ML SOPN Inject 0.4 mLs (40 Units total) daily at 10 pm into the skin. 01/19/17   Nche, Charlene Brooke, NP  Insulin Pen Needle (BD PEN NEEDLE NANO U/F) 32G X 4 MM MISC USE ONCE DAILY AS DIRECTED. 01/18/17   Nche, Charlene Brooke, NP  Lancets MISC Use one lancet per test to check blood sugar 1-4 times daily. Dx code:E11.9 12/27/15   Golden Circle, FNP  meloxicam (MOBIC) 7.5 MG tablet Take 1 tablet (7.5 mg total) by mouth daily. 01/04/17   Nche, Charlene Brooke, NP    NON FORMULARY Take 1 capsule by mouth daily. [place] Supplement    [provider]  OVER THE COUNTER MEDICATION Take 1 capsule by mouth daily. Techui Supplement    [provider]  potassium chloride SA (K-DUR,KLOR-CON) 20 MEQ tablet Take 1 tablet (20 mEq total) 2 (two) times daily by mouth. 01/18/17   Nche, Charlene Brooke, NP    Family History Family History  Problem Relation Age of Onset  . Hypertension Mother   . Diabetes Mother   . Coronary artery disease Father   . Heart failure Father   . Heart attack Father   . Diabetes Father   . Hypertension Other   . Hyperlipidemia Other     Social History Social History   Tobacco Use  . Smoking status: Never Smoker  . Smokeless tobacco: Never Used  Substance Use Topics  . Alcohol use: No  . Drug use: No     Allergies   Lisinopril   Review of Systems Review of Systems  Neurological: Positive for numbness.  All other systems reviewed and are negative.    Physical Exam Updated Vital Signs BP (!) 170/108 (BP Location: Right Arm)   Pulse 95   Temp 98.4 F (36.9 C) (Oral)   Resp (!) 21   LMP 12/18/2014 (Exact Date)   SpO2 99%   Physical Exam  Constitutional: She is oriented to person, place, and time. She appears well-developed and well-nourished.  HENT:  Head: Normocephalic and atraumatic.  Mouth/Throat: Oropharynx is clear and moist.  Eyes: Conjunctivae and EOM are normal. Pupils are equal, round, and reactive to light.  Neck: Normal range of motion.  Cardiovascular: Normal rate, regular rhythm and normal heart sounds.  Pulmonary/Chest: Effort normal and breath sounds normal. No stridor. No respiratory distress.  Abdominal: Soft. Bowel sounds are normal. There is no tenderness. There is no rebound.  Musculoskeletal: Normal range of motion.  Neurological: She is alert and oriented to person, place, and time.  AAOx3, answering questions and following commands appropriately; equal strength UE and LE  bilaterally; subjective decreased sensation of right arm/leg compared with left; CN grossly intact; moves all extremities appropriately without ataxia; no focal neuro deficits or facial asymmetry appreciated  Skin: Skin is warm and dry.  Psychiatric: She has a normal mood and affect.  Nursing note and vitals reviewed.    ED Treatments / Results  Labs (all labs ordered are listed, but only abnormal results are displayed) Labs Reviewed  CBC - Abnormal; Notable for the following components:      Result Value   RBC 5.29 (*)    Hemoglobin 15.4 (*)    All other components within normal limits  COMPREHENSIVE METABOLIC PANEL - Abnormal; Notable for the following components:   Sodium 131 (*)    Potassium 3.3 (*)    Chloride 94 (*)  Glucose, Bld 450 (*)    Calcium 8.7 (*)    AST 69 (*)    ALT 97 (*)    All other components within normal limits  URINALYSIS, ROUTINE W REFLEX MICROSCOPIC - Abnormal; Notable for the following components:   Color, Urine STRAW (*)    Glucose, UA >=500 (*)    Squamous Epithelial / LPF 0-5 (*)    All other components within normal limits  HEMOGLOBIN A1C - Abnormal; Notable for the following components:   Hgb A1c MFr Bld 12.7 (*)    All other components within normal limits  LIPID PANEL - Abnormal; Notable for the following components:   LDL Cholesterol 126 (*)    All other components within normal limits  I-STAT CHEM 8, ED - Abnormal; Notable for the following components:   Potassium 3.3 (*)    Chloride 90 (*)    Glucose, Bld 467 (*)    Calcium, Ion 1.10 (*)    Hemoglobin 17.3 (*)    HCT 51.0 (*)    All other components within normal limits  CBG MONITORING, ED - Abnormal; Notable for the following components:   Glucose-Capillary 371 (*)    All other components within normal limits  CBG MONITORING, ED - Abnormal; Notable for the following components:   Glucose-Capillary 292 (*)    All other components within normal limits  ETHANOL  PROTIME-INR    APTT  DIFFERENTIAL  RAPID URINE DRUG SCREEN, HOSP PERFORMED  HIV ANTIBODY (ROUTINE TESTING)  I-STAT TROPONIN, ED  I-STAT BETA HCG BLOOD, ED ([facility], [facility], [facility] ONLY)    EKG  EKG Interpretation None       Radiology Ct Head Code Stroke Wo Contrast  Result Date: 03/02/2017 CLINICAL DATA:  Code stroke. Right-sided numbness. Last seen while 04/[birth year]1928 EXAM: CT HEAD WITHOUT CONTRAST TECHNIQUE: Contiguous axial images were obtained from the base of the skull through the vertex without intravenous contrast. COMPARISON:  Head CT 03/24/2009 FINDINGS: Brain: No mass lesion or acute hemorrhage. No focal hypoattenuation of the basal ganglia or cortex to indicate acutely infarcted tissue. Old infarct of the right caudate head, new since 03/24/2009. No hydrocephalus or age advanced atrophy. Vascular: No hyperdense vessel. No advanced atherosclerotic calcification of the arteries at the skull base. Skull: Normal visualized skull base, calvarium and extracranial soft tissues. Sinuses/Orbits: No sinus fluid levels or advanced mucosal thickening. No mastoid effusion. Normal orbits. ASPECTS [facility] Stroke Program Early CT Score) - Ganglionic level infarction (caudate, lentiform nuclei, internal capsule, insula, M1-M3 cortex): 7 - Supraganglionic infarction (M4-M6 cortex): 3 Total score (0-10 with 10 being normal): 10 IMPRESSION: 1. No acute hemorrhage. 2. 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 3. ASPECTS is 10. These results were communicated to Dr. Karena Addison Aroor at 11:50 pm on 03/02/2017 by text page via the [facility] messaging system. Electronically Signed   By: Ulyses Jarred M.D.   On: 03/02/2017 23:50    Procedures Procedures (including critical care time)  Medications Ordered in ED Medications - No data to display   Initial Impression / Assessment and Plan / ED Course  I have reviewed the triage vital signs and the nursing notes.  Pertinent labs & imaging results that were  available during my care of the patient were reviewed by me and considered in my medical decision making (see chart for details).  46 year old female presenting to the ED with right-sided numbness/tingling onset 2130.  Code stroke activated by EMS.  Patient in NAD on arrival.  On exam, patient with subjective altered sensation of right upper and lower extremities.  Neurology has evaluated, concern for acute thalamic stroke vs TIA.  Patient to be admitted for full stroke workup.  Not a TPA candidate given NIH scale of 1 with symptoms isolated to sensory deficit and not debilitating.  Discussed with Dr. Tamala Julian of hospitalist service-- will admit for further work-up/ongoing care.  Final Clinical Impressions(s) / ED Diagnoses   Final diagnoses:  Right sided numbness    ED Discharge Orders    None       Larene Pickett, PA-C 03/03/17 [id number]    Ripley Fraise, MD 03/03/17 [phone]

## 2017-03-03 NOTE — Progress Notes (Signed)
Lindsay Gomez is a 46 y.o. Female coming from home via [facility] with acute onset of Right side numbness and tingling starting around 2130. Pt has a history of DM and HTN. CBG 490, LKW 2130, NIH 1. Pt to be admitted to triad.

## 2017-03-11 ENCOUNTER — Encounter: Payer: Self-pay | Admitting: Nurse Practitioner

## 2017-03-11 ENCOUNTER — Ambulatory Visit: Payer: BC Managed Care – PPO | Admitting: Nurse Practitioner

## 2017-03-11 ENCOUNTER — Other Ambulatory Visit (INDEPENDENT_AMBULATORY_CARE_PROVIDER_SITE_OTHER): Payer: BC Managed Care – PPO

## 2017-03-11 ENCOUNTER — Telehealth: Payer: Self-pay

## 2017-03-11 VITALS — BP 140/98 | HR 92 | Temp 98.3°F | Resp 16 | Ht 62.0 in | Wt 286.0 lb

## 2017-03-11 DIAGNOSIS — Z794 Long term (current) use of insulin: Secondary | ICD-10-CM

## 2017-03-11 DIAGNOSIS — I10 Essential (primary) hypertension: Secondary | ICD-10-CM

## 2017-03-11 DIAGNOSIS — M25551 Pain in right hip: Secondary | ICD-10-CM

## 2017-03-11 DIAGNOSIS — I639 Cerebral infarction, unspecified: Secondary | ICD-10-CM | POA: Diagnosis not present

## 2017-03-11 DIAGNOSIS — E1169 Type 2 diabetes mellitus with other specified complication: Secondary | ICD-10-CM

## 2017-03-11 LAB — CBC
HCT: 49 % — ABNORMAL HIGH (ref 36.0–46.0)
Hemoglobin: 15.8 g/dL — ABNORMAL HIGH (ref 12.0–15.0)
MCHC: 32.2 g/dL (ref 30.0–36.0)
MCV: 88.4 fl (ref 78.0–100.0)
Platelets: 275 10*3/uL (ref 150.0–400.0)
RBC: 5.54 Mil/uL — ABNORMAL HIGH (ref 3.87–5.11)
RDW: 13.6 % (ref 11.5–15.5)
WBC: 6 10*3/uL (ref 4.0–10.5)

## 2017-03-11 LAB — COMPREHENSIVE METABOLIC PANEL
ALT: 106 U/L — ABNORMAL HIGH (ref 0–35)
AST: 58 U/L — ABNORMAL HIGH (ref 0–37)
Albumin: 4 g/dL (ref 3.5–5.2)
Alkaline Phosphatase: 99 U/L (ref 39–117)
BUN: 11 mg/dL (ref 6–23)
CO2: 36 mEq/L — ABNORMAL HIGH (ref 19–32)
Calcium: 9.2 mg/dL (ref 8.4–10.5)
Chloride: 92 mEq/L — ABNORMAL LOW (ref 96–112)
Creatinine, Ser: 0.77 mg/dL (ref 0.40–1.20)
GFR: 104.47 mL/min (ref >60.00)
Glucose, Bld: 400 mg/dL — ABNORMAL HIGH (ref 70–99)
Potassium: 3.1 mEq/L — ABNORMAL LOW (ref 3.5–5.1)
Sodium: 134 mEq/L — ABNORMAL LOW (ref 135–145)
Total Bilirubin: 0.4 mg/dL (ref 0.2–1.2)
Total Protein: 7.7 g/dL (ref 6.0–8.3)

## 2017-03-11 MED ORDER — GLIPIZIDE ER 5 MG PO TB24: ORAL_TABLET | ORAL | 1 refills | Status: DC

## 2017-03-11 NOTE — Progress Notes (Signed)
Subjective:    Patient ID: Lindsay Gomez, female    DOB: 02/27/1971, 46 y.o.   MRN: [id number]  HPI  Lindsay Gomez is a 66ypo female who presents today establish care. She is transferring to me from another provider in the same clinic. She presents today for an ER follow up visit.  She was admitted ot the [facility] ER on 12/18 as a code stroke. She developed sudden right arm numbness just prior to her arrival to the ER. She did not report any weakness, confusion, speech trouble, or past history of stroke or TIA. 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 She was to be admitted to the hospitalist group for ongoing care, however from the hospital summary it appears that she stayed overnight in the ED and was discharged to home from the ED. She says she did not stay for full admission but requested early discharge because she needed to go to work. She is scheduled for a follow up with neurology in February, but requests a referral to neurology today as she was told that she may be able to get an appointment sooner with referral from her PCP. She is concerned because she continues to have numbness and pain in her right face, right arm, right leg and right foot since her hospitalization. These symptoms have not worsened. She denies any new weakness, confusion, syncope, vision changes, slurred speech since her ED visit.  Since she has been home, she has been able to go to work and independently care for herself. She reports she has continued to take aspirin and atorvastatin as instructed on her hospital discharge summary. She also c/o right hip pain today. This hip pain has been a chronic problem for  her, ongoing since 2014. She had a normal x-ray of the right hip on 10/22. She describes the pain as a sore pain that is worse with movement such as bending, lifting, twisting. Ibuprofen provides some relief. She does not recall injuring the hip. She reports continued weakness, numbness and pain in her right leg and foot since her ED visit. She denies any bowel or bladder changes.  Diabetes - maintained on basaglar 50 units daily, trulicity once a week on Friday, glipizide 5 daily. She does admit she has not been taking her medications consistently. She has "been a little better" about taking her medications as instructed since her stroke, and got a new glucometer to try to monitor her blood sugars better at home. She reports her readings over past week at home have ranged from 170-300, closer to 170 when she is compliant with her medications. She did go to an endocrinology provider in the past, but felt they were "too pushy" and requests a referral to a new endocrinologist today.  Lab Results  Component Value Date   HGBA1C 12.7 (H) 03/03/2017    Review of Systems See HPI  Past Medical History:  Diagnosis Date  . Allergy    rhinitis  . Anemia   . Anxiety   . Diabetes mellitus without complication ([place])   . Enlarged heart   . Hypertension   . Morbid obesity ([place])   . Shortness of breath dyspnea    with low iron  Social History   Socioeconomic History  . Marital status: Single    Spouse name: Not on file  . Number of children: Not on file  . Years of education: Not on file  . Highest education level: Not on file  Social Needs  . Financial resource strain: Not on file  . Food insecurity - worry: Not on file  . Food insecurity - inability: Not on file  . Transportation needs - medical: Not on file  . Transportation needs - non-medical: Not on file  Occupational History  . Not on file  Tobacco Use  . Smoking status: Never Smoker  . Smokeless tobacco: Never Used    Substance and Sexual Activity  . Alcohol use: No  . Drug use: No  . Sexual activity: No    Birth control/protection: None  Other Topics Concern  . Not on file  Social History Narrative   Regular exercise    Past Surgical History:  Procedure Laterality Date  . ABDOMINAL HYSTERECTOMY N/A 01/29/2015   Procedure: TOTAL ABDOMINAL HYSTERECTOMY ;  Surgeon: Ena Dawley, MD;  Location: [facility];  Service: Gynecology;  Laterality: N/A;  . BILATERAL SALPINGECTOMY Bilateral 01/29/2015   Procedure: BILATERAL SALPINGECTOMY;  Surgeon: Ena Dawley, MD;  Location: [facility];  Service: Gynecology;  Laterality: Bilateral;  . CESAREAN SECTION    . KNEE SURGERY  1992   ? arthroscopic/ right    Family History  Problem Relation Age of Onset  . Hypertension Mother   . Diabetes Mother   . Coronary artery disease Father   . Heart failure Father   . Heart attack Father   . Diabetes Father   . Hypertension Other   . Hyperlipidemia Other     Allergies  Allergen Reactions  . Lisinopril Other (See Comments)    Cough     Current Outpatient Medications on File Prior to Visit  Medication Sig Dispense Refill  . aspirin EC 325 MG EC tablet Take 1 tablet (325 mg total) by mouth daily. 30 tablet 0  . atorvastatin (LIPITOR) 40 MG tablet Take 1 tablet (40 mg total) by mouth daily at 6 PM. 30 tablet 0  . Azilsartan-Chlorthalidone (EDARBYCLOR) 40-25 MG TABS Take 1 tablet by mouth daily. 30 tablet 0  . Blood Glucose Monitoring Suppl (ONE TOUCH ULTRA MINI) w/Device KIT Use device to test blood sugars 1-4 times daily as instructed. 1 each 0  . bromocriptine (PARLODEL) 2.5 MG tablet Take 1/4 tablet by mouth daily. 8 tablet 0  . Continuous Blood Gluc Sensor MISC 1 each by Does not apply route as directed. Use as directed every 10 days. May dispense FreeStyle [employer] or similar. Patient has reader and will need prescription for 3-day sensors 3 each 0  . Dulaglutide (TRULICITY) 1.5 BZ/2.0EY SOPN  INJECT 0.5ML SUBCUTANEOUSLY ONCE A WEEK AS DIRECTED. 2 mL 1  . gabapentin (NEURONTIN) 100 MG capsule Take 1 capsule (100 mg total) by mouth 2 (two) times daily. 60 capsule 0  . glipiZIDE (GLIPIZIDE XL) 5 MG 24 hr tablet TAKE 1 TABLET WITH BREAKFAST. 30 tablet 0  . glucose blood test strip Use as instructed 100 each 12  . Insulin Glargine (BASAGLAR KWIKPEN) 100 UNIT/ML SOPN Inject 0.5 mLs (50 Units total) into the skin daily at 10 pm. 30 pen 0  . Insulin Pen Needle (BD PEN NEEDLE NANO U/F) 32G X 4 MM MISC USE ONCE DAILY AS DIRECTED. 100 each 0  . Lancets MISC Use one lancet per test to  check blood sugar 1-4 times daily. Dx code:E11.9 100 each 5  . potassium chloride SA (K-DUR,KLOR-CON) 20 MEQ tablet Take 1 tablet (20 mEq total) 2 (two) times daily by mouth. (Patient taking differently: Take 20 mEq by mouth daily. ) 60 tablet 0   No current facility-administered medications on file prior to visit.     BP (!) 140/98 (BP Location: Left Arm, Patient Position: Sitting, Cuff Size: Large)   Pulse 92   Temp 98.3 F (36.8 C) (Oral)   Resp 16   Ht 5' 2"  (1.575 m)   Wt 286 lb (129.7 kg)   LMP 12/18/2014 (Exact Date)   SpO2 98%   BMI 52.31 kg/m       Objective:   Physical Exam  Constitutional: She is oriented to person, place, and time. She appears well-developed and well-nourished. No distress.  HENT:  Head: Normocephalic and atraumatic.  Eyes: Conjunctivae, EOM and lids are normal. Pupils are equal, round, and reactive to light.  Cardiovascular: Normal rate, regular rhythm, normal heart sounds and intact distal pulses.  Pulmonary/Chest: Effort normal and breath sounds normal.  Musculoskeletal: She exhibits no edema or deformity.       Right hip: She exhibits normal range of motion, normal strength and no tenderness.  Negative bilateral straight leg raise.  Neurological: She is alert and oriented to person, place, and time. She has normal strength and normal reflexes. No cranial nerve  deficit. Coordination and gait normal. GCS eye subscore is 4. GCS verbal subscore is 5. GCS motor subscore is 6.  Skin: Skin is warm and dry.  Psychiatric: She has a normal mood and affect. Judgment and thought content normal.          Assessment & Plan:  She was pretty disengaged during our visit, even answer several phone calls during our discussion. We may need to have a more in-depth discussion about her healthcare goals and motivation in the future, as it is going to be very difficult to provide good care to her if she is unwilling to comply with recommended plan of care/ F/U in 1 month for chronic conditions, we'll see if she is more compliant at next visit

## 2017-03-11 NOTE — Telephone Encounter (Signed)
LVM letting pt know that another referral for Neurology is not needed for her to get in. I left number for [facility] for her to call back and set up an appointment at her convenience.

## 2017-03-11 NOTE — Patient Instructions (Signed)
Please head downstairs for lab work.  I have placed a referral to endocrinology and neurology . Our office will call you to schedule this appointment. You should hear from our office in 7-10 days.  If you develop confusion, trouble with your speech, worsening weakness or loss of control on one side of your body, please call 911 right away!  Please work on healthy diet, exercise and taking your medications daily as prescribed.  Id like to see you back in about 1 month, to see how you are doing.  It was nice to meet you. Thanks for letting me take care of you today :)

## 2017-03-12 ENCOUNTER — Telehealth: Payer: Self-pay | Admitting: Nurse Practitioner

## 2017-03-12 MED ORDER — CONTINUOUS BLOOD GLUC SENSOR MISC: 1.0000 | 1 refills | Status: DC

## 2017-03-12 NOTE — Telephone Encounter (Signed)
Reviewed chart pt is up-to-date sent refills to [facility].../lmb  

## 2017-03-12 NOTE — Telephone Encounter (Signed)
Copied from [place]. Topic: Quick Communication - See Telephone Encounter >> Mar 12, 2017  2:38 PM Cleaster Corin, NT wrote: CRM for notification. See Telephone encounter for:   03/12/17. Pt. Calling to get rx. For continuous blood gluc sensor misc. Pt. Would like for someone to give her a call when done [place], [place] - [address] [place] [address] Phone: [phone] Fax: [phone]

## 2017-03-14 ENCOUNTER — Encounter: Payer: Self-pay | Admitting: Nurse Practitioner

## 2017-03-14 DIAGNOSIS — I639 Cerebral infarction, unspecified: Secondary | ICD-10-CM | POA: Insufficient documentation

## 2017-03-14 DIAGNOSIS — M25551 Pain in right hip: Secondary | ICD-10-CM | POA: Insufficient documentation

## 2017-03-14 NOTE — Assessment & Plan Note (Signed)
It appears that neurology did try to schedule a sooner appointment with patient, which was not at a convenient time for her schedule. Stroke symptoms appear stable since ED discharge, with normal neuro exam in office today. Will refer back to neurology for stroke follow up, in the meantime she was given strict return precautions including calling 911 for new stroke symptoms. Referrals ordered: - Ambulatory referral to Neurology

## 2017-03-14 NOTE — Assessment & Plan Note (Signed)
Poorly controlled obviously due to patient non-compliance. No need to adjust meds if shes not going to take them, which we discussed. We discussed proper medication compliance and healthy diet and exercise to improve control of diabetes. She was pretty disengaged during our visit, even answer several phone calls during our discussion. She was not happy with a past endocrinology provider, so will refer to new endocrinologist to see if we can get some help with her elevated A1c. Medications ordered: - glipiZIDE (GLIPIZIDE XL) 5 MG 24 hr tablet; TAKE 1 TABLET WITH BREAKFAST.  Dispense: 90 tablet; Refill: 1 Referrals ordered: - Ambulatory referral to Endocrinology Diagnostic testing ordered: - Comprehensive metabolic panel; Future

## 2017-03-14 NOTE — Assessment & Plan Note (Signed)
Chronic with newer symptoms seeming to come from stroke. Normal PE today. We discussed holding off on further management of hip pain at this time while we address her recent stroke, elevated blood pressure and uncontrolled diabetes and she agrees.

## 2017-03-14 NOTE — Assessment & Plan Note (Signed)
Non compliance with medications likely contributing to elevated readings We discussed medication compliance, diet and exercise today. Will re-check and re-address at 1 month f/u Diagnostic testing ordered: - CBC; Future - Comprehensive metabolic panel; Future

## 2017-03-15 ENCOUNTER — Other Ambulatory Visit: Payer: Self-pay | Admitting: Nurse Practitioner

## 2017-03-15 DIAGNOSIS — E1169 Type 2 diabetes mellitus with other specified complication: Secondary | ICD-10-CM

## 2017-03-15 DIAGNOSIS — I10 Essential (primary) hypertension: Secondary | ICD-10-CM

## 2017-03-15 DIAGNOSIS — Z794 Long term (current) use of insulin: Secondary | ICD-10-CM

## 2017-03-15 DIAGNOSIS — R7989 Other specified abnormal findings of blood chemistry: Secondary | ICD-10-CM

## 2017-03-15 DIAGNOSIS — E876 Hypokalemia: Secondary | ICD-10-CM

## 2017-03-15 DIAGNOSIS — R945 Abnormal results of liver function studies: Principal | ICD-10-CM

## 2017-03-15 MED ORDER — POTASSIUM CHLORIDE CRYS ER 20 MEQ PO TBCR: 20.0000 meq | EXTENDED_RELEASE_TABLET | Freq: Two times a day (BID) | ORAL | 0 refills | Status: DC

## 2017-03-15 NOTE — Progress Notes (Signed)
orders

## 2017-03-17 ENCOUNTER — Telehealth: Payer: Self-pay | Admitting: *Deleted

## 2017-03-18 ENCOUNTER — Other Ambulatory Visit: Payer: Self-pay | Admitting: Nurse Practitioner

## 2017-03-18 DIAGNOSIS — I639 Cerebral infarction, unspecified: Secondary | ICD-10-CM

## 2017-03-18 NOTE — Progress Notes (Signed)
orders

## 2017-03-26 ENCOUNTER — Other Ambulatory Visit (INDEPENDENT_AMBULATORY_CARE_PROVIDER_SITE_OTHER): Payer: BC Managed Care – PPO

## 2017-03-26 ENCOUNTER — Ambulatory Visit: Payer: Self-pay | Admitting: *Deleted

## 2017-03-26 DIAGNOSIS — Z794 Long term (current) use of insulin: Secondary | ICD-10-CM

## 2017-03-26 DIAGNOSIS — E1169 Type 2 diabetes mellitus with other specified complication: Secondary | ICD-10-CM | POA: Diagnosis not present

## 2017-03-26 DIAGNOSIS — E876 Hypokalemia: Secondary | ICD-10-CM | POA: Diagnosis not present

## 2017-03-26 DIAGNOSIS — I10 Essential (primary) hypertension: Secondary | ICD-10-CM | POA: Diagnosis not present

## 2017-03-26 LAB — CBC
HCT: 44.8 % (ref 36.0–46.0)
Hemoglobin: 14.4 g/dL (ref 12.0–15.0)
MCHC: 32 g/dL (ref 30.0–36.0)
MCV: 87.5 fl (ref 78.0–100.0)
Platelets: 273 10*3/uL (ref 150.0–400.0)
RBC: 5.12 Mil/uL — ABNORMAL HIGH (ref 3.87–5.11)
RDW: 12.9 % (ref 11.5–15.5)
WBC: 7.6 10*3/uL (ref 4.0–10.5)

## 2017-03-26 LAB — COMPREHENSIVE METABOLIC PANEL
ALT: 70 U/L — ABNORMAL HIGH (ref 0–35)
AST: 46 U/L — ABNORMAL HIGH (ref 0–37)
Albumin: 3.8 g/dL (ref 3.5–5.2)
Alkaline Phosphatase: 96 U/L (ref 39–117)
BUN: 12 mg/dL (ref 6–23)
CO2: 33 mEq/L — ABNORMAL HIGH (ref 19–32)
Calcium: 9.1 mg/dL (ref 8.4–10.5)
Chloride: 94 mEq/L — ABNORMAL LOW (ref 96–112)
Creatinine, Ser: 0.84 mg/dL (ref 0.40–1.20)
GFR: 94.47 mL/min (ref >60.00)
Glucose, Bld: 247 mg/dL — ABNORMAL HIGH (ref 70–99)
Potassium: 3 mEq/L — ABNORMAL LOW (ref 3.5–5.1)
Sodium: 135 mEq/L (ref 135–145)
Total Bilirubin: 0.3 mg/dL (ref 0.2–1.2)
Total Protein: 7.6 g/dL (ref 6.0–8.3)

## 2017-03-26 NOTE — Telephone Encounter (Addendum)
Call to patient-she is waiting on her friend- Oleh Genin to arrive before she calls 911. She states she is going to be there in 10 minutes. Called patient back- twice- got VM.  Reason for Disposition . [1] Weakness (i.e., paralysis, loss of muscle strength) of the face, arm / hand, or leg / foot on one side of the body AND [2] sudden onset AND [3] present now  Answer Assessment - Initial Assessment Questions 1. SYMPTOM: "What is the main symptom you are concerned about?" (e.g., weakness, numbness)     Numbness- with new pain in neck under ear under chin on R  2. ONSET: "When did this start?" (minutes, hours, days; while sleeping)     Today about 12- feels like ear infection hurts bad 3. LAST NORMAL: "When was the last time you were normal (no symptoms)?"     Patient was diagnosed with stoke recently.      Patient states she is having right sided neck pain that hurts really bad- she states she is weak and fatigue she just wants to sleep. Advised patient she needs to go to ED know- she does not need to lay down to sleep now. Patient is reluctant to call 911- wants to call friend to take her.  Protocols used: NEUROLOGIC DEFICIT-A-AH

## 2017-03-27 ENCOUNTER — Other Ambulatory Visit: Payer: Self-pay | Admitting: Nurse Practitioner

## 2017-03-30 ENCOUNTER — Other Ambulatory Visit: Payer: Self-pay | Admitting: Nurse Practitioner

## 2017-03-30 DIAGNOSIS — E1169 Type 2 diabetes mellitus with other specified complication: Secondary | ICD-10-CM

## 2017-03-30 DIAGNOSIS — Z794 Long term (current) use of insulin: Principal | ICD-10-CM

## 2017-03-30 MED ORDER — DULAGLUTIDE 1.5 MG/0.5ML ~~LOC~~ SOAJ: SUBCUTANEOUS | 3 refills | Status: DC

## 2017-03-31 ENCOUNTER — Other Ambulatory Visit: Payer: Self-pay

## 2017-03-31 ENCOUNTER — Ambulatory Visit: Payer: BC Managed Care – PPO | Admitting: Physical Therapy

## 2017-03-31 DIAGNOSIS — Z794 Long term (current) use of insulin: Principal | ICD-10-CM

## 2017-03-31 DIAGNOSIS — E1169 Type 2 diabetes mellitus with other specified complication: Secondary | ICD-10-CM

## 2017-03-31 MED ORDER — DULAGLUTIDE 1.5 MG/0.5ML ~~LOC~~ SOAJ: SUBCUTANEOUS | 3 refills | Status: DC

## 2017-03-31 MED ORDER — CONTINUOUS BLOOD GLUC SENSOR MISC: 1.0000 | 1 refills | Status: DC

## 2017-04-01 ENCOUNTER — Telehealth: Payer: Self-pay

## 2017-04-02 ENCOUNTER — Other Ambulatory Visit: Payer: Self-pay

## 2017-04-02 ENCOUNTER — Ambulatory Visit: Payer: BC Managed Care – PPO | Attending: Nurse Practitioner | Admitting: Rehabilitation

## 2017-04-02 ENCOUNTER — Encounter: Payer: Self-pay | Admitting: Rehabilitation

## 2017-04-02 DIAGNOSIS — R208 Other disturbances of skin sensation: Secondary | ICD-10-CM | POA: Diagnosis present

## 2017-04-02 NOTE — Therapy (Signed)
[facility] [address] [place], [place], [address] Phone: [phone]   Fax:  [phone]  Physical Therapy Evaluation  Patient Details  Name: Lindsay Gomez MRN: [id number] Date of Birth: [birth year] Referring Provider: Caesar Chestnut, NP   Encounter Date: 04/02/2017  PT End of Session - 04/02/17 1659    Visit Number  1    Number of Visits  1 one time eval only    PT Start Time  1405    PT Stop Time  1450    PT Time Calculation (min)  45 min    Activity Tolerance  Patient tolerated treatment well    Behavior During Therapy  [facility] for tasks assessed/performed       Past Medical History:  Diagnosis Date  . Allergy    rhinitis  . Anemia   . Anxiety   . Diabetes mellitus without complication ([place])   . Enlarged heart   . Hypertension   . Morbid obesity ([place])   . Shortness of breath dyspnea    with low iron    Past Surgical History:  Procedure Laterality Date  . ABDOMINAL HYSTERECTOMY N/A 01/29/2015   Procedure: TOTAL ABDOMINAL HYSTERECTOMY ;  Surgeon: Ena Dawley, MD;  Location: [facility];  Service: Gynecology;  Laterality: N/A;  . BILATERAL SALPINGECTOMY Bilateral 01/29/2015   Procedure: BILATERAL SALPINGECTOMY;  Surgeon: Ena Dawley, MD;  Location: [facility];  Service: Gynecology;  Laterality: Bilateral;  . CESAREAN SECTION    . KNEE SURGERY  1992   ? arthroscopic/ right    There were no vitals filed for this visit.   Subjective Assessment - 04/02/17 1410    Subjective  "I would like to work on my R side, it is numb and has been numb since the stroke."      Patient is accompained by:  Family member John    Pertinent History  DM, HTN, CVA 12/18, chronic R hip and low back pain    Limitations  Writing    Patient Stated Goals  "I want to fix this numbness"     Currently in Pain?  No/denies         [facility] PT Assessment - 04/02/17 0001      Assessment   Medical Diagnosis  L CVA      Referring Provider  Caesar Chestnut, NP    Onset Date/Surgical Date  03/02/17    Hand Dominance  Right      Precautions   Precaution Comments  Decreased sensation on R side       Balance Screen   Has the patient fallen in the past 6 months  No    Has the patient had a decrease in activity level because of a fear of falling?   No    Is the patient reluctant to leave their home because of a fear of falling?   No      Home Environment   Living Environment  Private residence    Living Arrangements  Alone    Available Help at Discharge  Family;Available PRN/intermittently significant other    Type of Home  House    Home Access  Stairs to enter 1 step    Entrance Stairs-Number of Steps  1    Entrance Stairs-Rails  None    Home Layout  Two level;Bed/bath upstairs    Alternate Level Stairs-Number of Steps  15    Alternate Level Stairs-Rails  Left    Home Equipment  None  Prior Function   Level of Independence  Independent    Vocation  Full time employment    Equities trader (Kindergarten)    Leisure  Likes to go for walks, go to the park, go to the movies      Cognition   Overall Cognitive Status  Within Functional Limits for tasks assessed reports some memory issues, processing issues      Sensation   Light Touch  Impaired Detail    Light Touch Impaired Details  Impaired RUE;Impaired RLE light touch intact, reports feels numbness/tingling    Hot/Cold  Appears Intact    Proprioception  Impaired Detail    Proprioception Impaired Details  Impaired RLE      Coordination   Gross Motor Movements are Fluid and Coordinated  Yes    Fine Motor Movements are Fluid and Coordinated  Yes    Heel Shin Test  grossly WFL      ROM / Strength   AROM / PROM / Strength  Strength      Strength   Overall Strength  Within functional limits for tasks performed except for L hip flex 3+/5      Transfers   Transfers  Sit to Stand;Stand to Sit    Sit to Stand  7: Independent     Stand to Sit  7: Independent      Ambulation/Gait   Ambulation/Gait  Yes    Ambulation/Gait Assistance  7: Independent    Assistive device  None    Gait Pattern  Within Functional Limits    Ambulation Surface  Level;Indoor    Gait velocity  3.42 ft/sec    Stairs  Yes    Stairs Assistance  6: Modified independent (Device/Increase time)    Stair Management Technique  No rails;Alternating pattern;Forwards increased time    Number of Stairs  4    Height of Stairs  6      Functional Gait  Assessment   Gait assessed   Yes    Gait Level Surface  Walks 20 ft in less than 7 sec but greater than 5.5 sec, uses assistive device, slower speed, mild gait deviations, or deviates 6-10 in outside of the 12 in walkway width.    Change in Gait Speed  Able to smoothly change walking speed without loss of balance or gait deviation. Deviate no more than 6 in outside of the 12 in walkway width.    Gait with Horizontal Head Turns  Performs head turns smoothly with no change in gait. Deviates no more than 6 in outside 12 in walkway width    Gait with Vertical Head Turns  Performs head turns with no change in gait. Deviates no more than 6 in outside 12 in walkway width.    Gait and Pivot Turn  Pivot turns safely within 3 sec and stops quickly with no loss of balance.    Step Over Obstacle  Is able to step over 2 stacked shoe boxes taped together (9 in total height) without changing gait speed. No evidence of imbalance.    Gait with Narrow Base of Support  Ambulates 4-7 steps.    Gait with Eyes Closed  Walks 20 ft, no assistive devices, good speed, no evidence of imbalance, normal gait pattern, deviates no more than 6 in outside 12 in walkway width. Ambulates 20 ft in less than 7 sec.    Ambulating Backwards  Walks 20 ft, no assistive devices, good speed, no evidence for imbalance, normal gait  Steps  Alternating feet, no rail.    Total Score  27    FGA comment:  Interpretation: 25-28 = low risk fall               Objective measurements completed on examination: See above findings.              PT Education - 04/02/17 1657    Education provided  Yes    Education Details  evaluation findings, IT band release with foam roll or water bottle due to slight hip bursitis, education on CVA symptoms and reporting tingling to neurologist as PT would not be able to address this.     Person(s) Educated  Patient;Spouse    Methods  Explanation    Comprehension  Verbalized understanding                  Plan - 04/02/17 1659    Clinical Impression Statement  Pt presents s/p L thalamic CVA on 03/02/17 with increasing tingling and pain in RUE and LE.  Upon PT evaluation, note gait speed of 3.42 ft/sec indicative of safe community ambulator, no focal weakness except in L hip, FGA of 27/30 indicative of low fall risk, and normal 5TSS.  Provided education on CVA recovery and how to relates to numbness/tingling.  Also recommend she speak with neurologist regarding this matter as she may need medication management but that therapy would not address.  PT did not feel pt needing to be seen for formal therapy at this time.      History and Personal Factors relevant to plan of care:  HTN, DM, obesity    Clinical Presentation  Stable    Clinical Decision Making  Low    PT Frequency  One time visit    Consulted and Agree with Plan of Care  Patient       Patient will benefit from skilled therapeutic intervention in order to improve the following deficits and impairments:     Visit Diagnosis: Other disturbances of skin sensation     Problem List Patient Active Problem List   Diagnosis Date Noted  . Cerebrovascular accident (CVA) ([place]) 03/14/2017  . Right hip pain 03/14/2017  . Numbness 03/03/2017  . Hypocalcemia 03/03/2017  . Cerebral thrombosis with cerebral infarction 03/03/2017  . Major depressive disorder, single episode, moderate ([place]) 10/28/2015  . Microcytic hypochromic  anemia 07/29/2015  . Type 2 diabetes mellitus ([place]) 06/24/2015  . Depression 06/24/2015  . Polyuria 06/17/2015  . Obstructive sleep apnea 06/17/2015  . Anxiety and depression 04/24/2015  . Pre-op evaluation 01/09/2015  . Hyperlipidemia 01/06/2013  . Right lumbar radiculopathy 10/26/2012  . ANA positive 09/14/2012  . LVH (left ventricular hypertrophy) 12/08/2011  . Menorrhagia 09/24/2011  . Routine general medical examination at a health care facility 09/24/2011  . Carpal tunnel syndrome of right wrist 09/18/2010  . Hypokalemia 09/18/2010  . Morbid obesity ([place]) 11/29/2008  . Iron deficiency anemia 11/28/2008  . Essential hypertension 11/28/2008  . ALLERGIC RHINITIS 11/28/2008    Cameron Sprang, PT, MPT [facility] [address] [place], [place], [address] Phone: [phone]   Fax:  [phone] 04/02/17, 5:04 PM  Name: Lindsay Gomez MRN: [id number] Date of Birth: 07/28/1970

## 2017-04-06 ENCOUNTER — Ambulatory Visit
Admission: RE | Admit: 2017-04-06 | Discharge: 2017-04-06 | Disposition: A | Discharge status: Home / Self Care | Payer: BC Managed Care – PPO | Source: Ambulatory Visit | Attending: Nurse Practitioner | Admitting: Nurse Practitioner

## 2017-04-06 DIAGNOSIS — R945 Abnormal results of liver function studies: Principal | ICD-10-CM

## 2017-04-06 DIAGNOSIS — R7989 Other specified abnormal findings of blood chemistry: Secondary | ICD-10-CM

## 2017-04-08 ENCOUNTER — Encounter: Payer: Self-pay | Admitting: Nurse Practitioner

## 2017-04-09 ENCOUNTER — Telehealth: Payer: Self-pay | Admitting: Nurse Practitioner

## 2017-04-09 DIAGNOSIS — R899 Unspecified abnormal finding in specimens from other organs, systems and tissues: Secondary | ICD-10-CM

## 2017-04-09 NOTE — Telephone Encounter (Signed)
Sent through mychart

## 2017-04-09 NOTE — Telephone Encounter (Signed)
I have been looking through her chart and would like for her to : -make sure she is taking her potassium- stop by lab for a recheck next week -lets clarify her trulicity dosage: 1.5 mg per week -has she heard from endocrinology-she really needs to go.

## 2017-04-21 ENCOUNTER — Encounter: Payer: Self-pay | Admitting: Nurse Practitioner

## 2017-04-21 ENCOUNTER — Ambulatory Visit: Payer: BC Managed Care – PPO | Admitting: Nurse Practitioner

## 2017-04-21 ENCOUNTER — Encounter: Payer: Self-pay | Admitting: Neurology

## 2017-04-21 ENCOUNTER — Other Ambulatory Visit (INDEPENDENT_AMBULATORY_CARE_PROVIDER_SITE_OTHER): Payer: BC Managed Care – PPO

## 2017-04-21 VITALS — BP 128/82 | HR 102 | Temp 98.7°F | Resp 16 | Ht 62.0 in | Wt 289.0 lb

## 2017-04-21 DIAGNOSIS — I10 Essential (primary) hypertension: Secondary | ICD-10-CM

## 2017-04-21 DIAGNOSIS — E785 Hyperlipidemia, unspecified: Secondary | ICD-10-CM | POA: Diagnosis not present

## 2017-04-21 DIAGNOSIS — E1159 Type 2 diabetes mellitus with other circulatory complications: Secondary | ICD-10-CM | POA: Diagnosis not present

## 2017-04-21 DIAGNOSIS — R899 Unspecified abnormal finding in specimens from other organs, systems and tissues: Secondary | ICD-10-CM | POA: Diagnosis not present

## 2017-04-21 DIAGNOSIS — I639 Cerebral infarction, unspecified: Secondary | ICD-10-CM

## 2017-04-21 LAB — BASIC METABOLIC PANEL
BUN: 10 mg/dL (ref 6–23)
CO2: 35 mEq/L — ABNORMAL HIGH (ref 19–32)
Calcium: 9.4 mg/dL (ref 8.4–10.5)
Chloride: 95 mEq/L — ABNORMAL LOW (ref 96–112)
Creatinine, Ser: 0.83 mg/dL (ref 0.40–1.20)
GFR: 95.76 mL/min (ref >60.00)
Glucose, Bld: 233 mg/dL — ABNORMAL HIGH (ref 70–99)
Potassium: 3.4 mEq/L — ABNORMAL LOW (ref 3.5–5.1)
Sodium: 136 mEq/L (ref 135–145)

## 2017-04-21 MED ORDER — ATORVASTATIN CALCIUM 40 MG PO TABS: 40.0000 mg | ORAL_TABLET | Freq: Every day | ORAL | 1 refills | Status: DC

## 2017-04-21 NOTE — Assessment & Plan Note (Signed)
Stable, continue current medications She will go for BMET today- already ordered We discussed the role of healthy diet and exercise in the improvement of chronic conditions and overall health-See AVS for education provided to patient - Ambulatory referral to Nutrition and Diabetic Education

## 2017-04-21 NOTE — Assessment & Plan Note (Signed)
She is back on medications and blood sugar readings are improving at home. She will continue medications as is and follow upt with endocrinology on 2/27. We discussed the role of healthy diet and exercise in the improvement of chronic conditions and overall health-See AVS for education provided to patient - Ambulatory referral to Nutrition and Diabetic Education

## 2017-04-21 NOTE — Patient Instructions (Addendum)
Please head downstairs for lab work.  I have placed a referral to [facility] neurology and nutrition education. Our office will call you to schedule this appointment. You should hear from our office in 7-10 days.  Please come back in about 3 months, we can recheck your liver function and cholesterol, and see how you are doing overall  It was nice to see you. Thanks for letting me take care of you today :)  Mediterranean Diet A Mediterranean diet refers to food and lifestyle choices that are based on the traditions of countries located on the [employer]. This way of eating has been shown to help prevent certain conditions and improve outcomes for people who have chronic diseases, like kidney disease and heart disease. What are tips for following this plan? Lifestyle  Cook and eat meals together with your family, when possible.  Drink enough fluid to keep your urine clear or pale yellow.  Be physically active every day. This includes: ? Aerobic exercise like running or swimming. ? Leisure activities like gardening, walking, or housework.  Get 7-8 hours of sleep each night.  If recommended by your health care provider, drink red wine in moderation. This means 1 glass a day for nonpregnant women and 2 glasses a day for men. A glass of wine equals 5 oz (150 mL). Reading food labels  Check the serving size of packaged foods. For foods such as rice and pasta, the serving size refers to the amount of cooked product, not dry.  Check the total fat in packaged foods. Avoid foods that have saturated fat or trans fats.  Check the ingredients list for added sugars, such as corn syrup. Shopping  At the grocery store, buy most of your food from the areas near the walls of the store. This includes: ? Fresh fruits and vegetables (produce). ? Grains, beans, nuts, and seeds. Some of these may be available in unpackaged forms or large amounts (in bulk). ? Fresh seafood. ? Poultry and  eggs. ? Low-fat dairy products.  Buy whole ingredients instead of prepackaged foods.  Buy fresh fruits and vegetables in-season from local farmers markets.  Buy frozen fruits and vegetables in resealable bags.  If you do not have access to quality fresh seafood, buy precooked frozen shrimp or canned fish, such as tuna, salmon, or sardines.  Buy small amounts of raw or cooked vegetables, salads, or olives from the deli or salad bar at your store.  Stock your pantry so you always have certain foods on hand, such as olive oil, canned tuna, canned tomatoes, rice, pasta, and beans. Cooking  Cook foods with extra-virgin olive oil instead of using butter or other vegetable oils.  Have meat as a side dish, and have vegetables or grains as your main dish. This means having meat in small portions or adding small amounts of meat to foods like pasta or stew.  Use beans or vegetables instead of meat in common dishes like chili or lasagna.  Experiment with different cooking methods. Try roasting or broiling vegetables instead of steaming or sauteing them.  Add frozen vegetables to soups, stews, pasta, or rice.  Add nuts or seeds for added healthy fat at each meal. You can add these to yogurt, salads, or vegetable dishes.  Marinate fish or vegetables using olive oil, lemon juice, garlic, and fresh herbs. Meal planning  Plan to eat 1 vegetarian meal one day each week. Try to work up to 2 vegetarian meals, if possible.  Eat seafood 2  or more times a week.  Have healthy snacks readily available, such as: ? Vegetable sticks with hummus. ? [place] yogurt. ? Fruit and nut trail mix.  Eat balanced meals throughout the week. This includes: ? Fruit: 2-3 servings a day ? Vegetables: 4-5 servings a day ? Low-fat dairy: 2 servings a day ? Fish, poultry, or lean meat: 1 serving a day ? Beans and legumes: 2 or more servings a week ? Nuts and seeds: 1-2 servings a day ? Whole grains: 6-8 servings a  day ? Extra-virgin olive oil: 3-4 servings a day  Limit red meat and sweets to only a few servings a month What are my food choices?  Mediterranean diet ? Recommended ? Grains: Whole-grain pasta. Brown rice. Bulgar wheat. Polenta. Couscous. Whole-wheat bread. [place]. ? Vegetables: Artichokes. Beets. Broccoli. Cabbage. Carrots. Eggplant. Green beans. Chard. Kale. Spinach. Onions. Leeks. Peas. Squash. Tomatoes. Peppers. Radishes. ? Fruits: Apples. Apricots. Avocado. Berries. Bananas. Cherries. Dates. Figs. Grapes. Lemons. Melon. Oranges. Peaches. Plums. Pomegranate. ? Meats and other protein foods: Beans. Almonds. Sunflower seeds. Pine nuts. Peanuts. [place]. Salmon. Scallops. Shrimp. Citrus. Tilapia. Clams. Oysters. Eggs. ? Dairy: Low-fat milk. Cheese. Greek yogurt. ? Beverages: Water. Red wine. Herbal tea. ? Fats and oils: Extra virgin olive oil. Avocado oil. Grape seed oil. ? Sweets and desserts: [place] yogurt with honey. Baked apples. Poached pears. Trail mix. ? Seasoning and other foods: Basil. Cilantro. Coriander. Cumin. Mint. Parsley. Sage. Rosemary. Tarragon. Garlic. Oregano. Thyme. Pepper. Balsalmic vinegar. Tahini. Hummus. Tomato sauce. Olives. Mushrooms. ? Limit these ? Grains: Prepackaged pasta or rice dishes. Prepackaged cereal with added sugar. ? Vegetables: Deep fried potatoes (french fries). ? Fruits: Fruit canned in syrup. ? Meats and other protein foods: Beef. Pork. Lamb. Poultry with skin. Hot dogs. [place] Salines. ? Dairy: Ice cream. Sour cream. Whole milk. ? Beverages: Juice. Sugar-sweetened soft drinks. Beer. Liquor and spirits. ? Fats and oils: Butter. Canola oil. Vegetable oil. Beef fat (tallow). Lard. ? Sweets and desserts: Cookies. Cakes. Pies. Candy. ? Seasoning and other foods: Mayonnaise. Premade sauces and marinades. ? The items listed may not be a complete list. Talk with your dietitian about what dietary choices are right for you. Summary  The Mediterranean diet  includes both food and lifestyle choices.  Eat a variety of fresh fruits and vegetables, beans, nuts, seeds, and whole grains.  Limit the amount of red meat and sweets that you eat.  Talk with your health care provider about whether it is safe for you to drink red wine in moderation. This means 1 glass a day for nonpregnant women and 2 glasses a day for men. A glass of wine equals 5 oz (150 mL). This information is not intended to replace advice given to you by your health care provider. Make sure you discuss any questions you have with your health care provider. Document Released: 10/24/2015 Document Revised: 11/26/2015 Document Reviewed: 10/24/2015 Elsevier Interactive Patient Education  [employer].

## 2017-04-21 NOTE — Assessment & Plan Note (Signed)
Will refer to a different neurologist per her request today She says she will follow through with appointment this time. Our referral coordinator is also working on updating referral to OT today. - Ambulatory referral to Neurology

## 2017-04-21 NOTE — Progress Notes (Signed)
Name: Lindsay Gomez   MRN: [id number]    DOB: Jun 08, 1970   Date:04/21/2017       Progress Note  Subjective  Chief Complaint  Chief Complaint  Patient presents with  . Follow-up    diabetes wants a referral to a completely different neurologist    HPI  Diabetes- maintained on glipizde 5 daily, trulicity 77m weekly, basaglar 56 units daily. Reports since our last visit she has resumed her daily medications and has been taking every day as prescribed.compliance without adverse medication effects. home blood sugar readings today. She is wearing the freestyle monitor and has been checking her blood sugars frequently-with readings between 120s-170s. She has been trying to avoid fast food and eat a better diet. She would like to go to nutritionist and she did schedule an appointment with endocrinology to help manage her diabetes on 2/27- she already took off work to ensure she can go to her appointment. Denies tremor, diaphoresis, polyuria, polydipsia, polyphagia.  Lab Results  Component Value Date   HGBA1C 12.7 (H) 03/03/2017   CVA- continues to have right sided numbness and weakness since her stroke This has not improved or worsened. She declined last neurology referral due to appointment wait time, but says today she wants to go and asks for a new referral She denies any loss of consciousness, confusion, speech changes, falls. She was seen by physical therapy and told she did not qualify. She has not heard from occupational therapy  Hypertension -maintained on Edarbyclor 40-25 daily. She is also taking potasstium 40Meq daily for hypokalemia. She was supposed to return for repeat BMET after her last one on 1/11 showed a K of 3.0 but did not, she says she will go for lab draw today. Reports daily medication compliance without adverse medication effects. Denies headaches, vision changes, chest pain, shortness of breath, edema.  BP Readings from Last 3 Encounters:  04/21/17 128/82   03/11/17 (!) 140/98  03/03/17 (!) 165/118   Cholesterol- maintained on atorvastatin 40 daily. Reports daily medication compliance without adverse medication effects including myalgias.  Lab Results  Component Value Date   CHOL 188 03/03/2017   HDL 42 03/03/2017   LDLCALC 126 (H) 03/03/2017   LDLDIRECT 140.4 09/24/2011   TRIG 99 03/03/2017   CHOLHDL 4.5 03/03/2017    Patient Active Problem List   Diagnosis Date Noted  . Cerebro[place] (CVA) ([place] 03/14/2017  . Right hip pain 03/14/2017  . Numbness 03/03/2017  . Hypocalcemia 03/03/2017  . Cerebral thrombosis with cerebral infarction 03/03/2017  . Major depressive disorder, single episode, moderate ([place] 10/28/2015  . Microcytic hypochromic anemia 07/29/2015  . Type 2 diabetes mellitus ([place] 06/24/2015  . Depression 06/24/2015  . Polyuria 06/17/2015  . Obstructive sleep apnea 06/17/2015  . Anxiety and depression 04/24/2015  . Pre-op evaluation 01/09/2015  . Hyperlipidemia 01/06/2013  . Right lumbar radiculopathy 10/26/2012  . ANA positive 09/14/2012  . LVH (left ventricular hypertrophy) 12/08/2011  . Menorrhagia 09/24/2011  . Routine general medical examination at a health care facility 09/24/2011  . Carpal tunnel syndrome of right wrist 09/18/2010  . Hypokalemia 09/18/2010  . Morbid obesity ([place] 11/29/2008  . Iron deficiency anemia 11/28/2008  . Essential hypertension 11/28/2008  . ALLERGIC RHINITIS 11/28/2008    Past Surgical History:  Procedure Laterality Date  . ABDOMINAL HYSTERECTOMY N/A 01/29/2015   Procedure: TOTAL ABDOMINAL HYSTERECTOMY ;  Surgeon: AEna Dawley MD;  [facility];  Service: Gynecology;  Laterality: N/A;  . BILATERAL SALPINGECTOMY Bilateral 01/29/2015  Procedure: BILATERAL SALPINGECTOMY;  Surgeon: Ena Dawley, MD;  Location: [facility];  Service: Gynecology;  Laterality: Bilateral;  . CESAREAN SECTION    . KNEE SURGERY  1992   ? arthroscopic/ right    Family History   Problem Relation Age of Onset  . Hypertension Mother   . Diabetes Mother   . Coronary artery disease Father   . Heart failure Father   . Heart attack Father   . Diabetes Father   . Hypertension Other   . Hyperlipidemia Other     Social History   Socioeconomic History  . Marital status: Single    Spouse name: Not on file  . Number of children: Not on file  . Years of education: Not on file  . Highest education level: Not on file  Social Needs  . Financial resource strain: Not on file  . Food insecurity - worry: Not on file  . Food insecurity - inability: Not on file  . Transportation needs - medical: Not on file  . Transportation needs - non-medical: Not on file  Occupational History  . Not on file  Tobacco Use  . Smoking status: Never Smoker  . Smokeless tobacco: Never Used  Substance and Sexual Activity  . Alcohol use: No  . Drug use: No  . Sexual activity: No    Birth control/protection: None  Other Topics Concern  . Not on file  Social History Narrative   Regular exercise     Current Outpatient Medications:  .  aspirin EC 325 MG EC tablet, Take 1 tablet (325 mg total) by mouth daily., Disp: 30 tablet, Rfl: 0 .  atorvastatin (LIPITOR) 40 MG tablet, Take 1 tablet (40 mg total) by mouth daily at 6 PM., Disp: 90 tablet, Rfl: 1 .  Azilsartan-Chlorthalidone (EDARBYCLOR) 40-25 MG TABS, Take 1 tablet by mouth daily., Disp: 30 tablet, Rfl: 0 .  Blood Glucose Monitoring Suppl (ONE TOUCH ULTRA MINI) w/Device KIT, Use device to test blood sugars 1-4 times daily as instructed., Disp: 1 each, Rfl: 0 .  bromocriptine (PARLODEL) 2.5 MG tablet, Take 1/4 tablet by mouth daily., Disp: 8 tablet, Rfl: 0 .  Continuous Blood Gluc Sensor MISC, 1 each by Does not apply route as directed. Use as directed every 10 days. May dispense FreeStyle [employer] or similar. Patient has reader and will need prescription for 3-day sensors, Disp: 3 each, Rfl: 1 .  Dulaglutide (TRULICITY) 1.5  QT/6.2UQ SOPN, INJECT 1 ML SUBCUTANEOUSLY ONCE A WEEK AS DIRECTED., Disp: 2 mL, Rfl: 3 .  gabapentin (NEURONTIN) 100 MG capsule, Take 1 capsule (100 mg total) by mouth 2 (two) times daily., Disp: 60 capsule, Rfl: 0 .  glipiZIDE (GLIPIZIDE XL) 5 MG 24 hr tablet, TAKE 1 TABLET WITH BREAKFAST., Disp: 90 tablet, Rfl: 1 .  glucose blood test strip, Use as instructed, Disp: 100 each, Rfl: 12 .  Insulin Glargine (BASAGLAR KWIKPEN) 100 UNIT/ML SOPN, Inject 0.5 mLs (50 Units total) into the skin daily at 10 pm., Disp: 30 pen, Rfl: 0 .  Insulin Pen Needle (BD PEN NEEDLE NANO U/F) 32G X 4 MM MISC, USE ONCE DAILY AS DIRECTED., Disp: 100 each, Rfl: 0 .  Lancets MISC, Use one lancet per test to check blood sugar 1-4 times daily. Dx code:E11.9, Disp: 100 each, Rfl: 5 .  potassium chloride SA (K-DUR,KLOR-CON) 20 MEQ tablet, Take 1 tablet (20 mEq total) by mouth 2 (two) times daily., Disp: 60 tablet, Rfl: 0  Allergies  Allergen Reactions  .  Lisinopril Other (See Comments)    Cough     ROS See HPI  Objective  Vitals:   04/21/17 1119 04/21/17 1247  BP: 128/82   Pulse: (!) 108 (!) 102  Resp: 16   Temp: 98.7 F (37.1 C)   TempSrc: Oral   SpO2: 96%   Weight: 289 lb (131.1 kg)   Height: 5' 2"  (1.575 m)    Body mass index is 52.86 kg/m.  Physical Exam Vital signs reviewed Constitutional: She is oriented to person, place, and time. Obese. She appears well-developed and well-nourished. No distress.  HENT:  Head: Normocephalic and atraumatic.  Eyes: Conjunctivae, EOM and lids are normal. Pupils are equal, round, and reactive to light.  Cardiovascular: Normal rate, regular rhythm, normal heart sounds and intact distal pulses.  Pulmonary/Chest: Effort normal and breath sounds normal.  Musculoskeletal: She exhibits no edema or deformity.  Neurological: She is alert and oriented to person, place, and time. She has normal strength and normal reflexes. No cranial nerve deficit. Coordination and gait  normal. Skin: Skin is warm and dry.  Psychiatric: She has a normal mood and affect. Judgment and thought content normal.    Assessment & Plan RTC in about 3 months for follow up of chronic conditions-will obtain LFTs and Lipid panel at follow up She has greatly improved compliance and is actively working on diet and exercise, I highly encouraged her to keep up the good work!

## 2017-04-21 NOTE — Assessment & Plan Note (Signed)
Stable, continue current medications Will recheck lipids at next follow up- last done on 12/19 otherwise normal We discussed the role of healthy diet and exercise in the improvement of chronic conditions and overall health-See AVS for education provided to patient - Ambulatory referral to Nutrition and Diabetic Education - atorvastatin (LIPITOR) 40 MG tablet; Take 1 tablet (40 mg total) by mouth daily at 6 PM.  Dispense: 90 tablet; Refill: 1

## 2017-04-26 ENCOUNTER — Other Ambulatory Visit: Payer: Self-pay

## 2017-04-26 ENCOUNTER — Other Ambulatory Visit: Payer: Self-pay | Admitting: Nurse Practitioner

## 2017-04-26 DIAGNOSIS — E785 Hyperlipidemia, unspecified: Secondary | ICD-10-CM

## 2017-04-26 DIAGNOSIS — I10 Essential (primary) hypertension: Secondary | ICD-10-CM

## 2017-04-26 MED ORDER — AZILSARTAN-CHLORTHALIDONE 40-25 MG PO TABS: 1.0000 | ORAL_TABLET | Freq: Every day | ORAL | 0 refills | Status: DC

## 2017-04-28 ENCOUNTER — Ambulatory Visit: Payer: BC Managed Care – PPO | Admitting: Occupational Therapy

## 2017-05-04 ENCOUNTER — Encounter: Payer: BC Managed Care – PPO | Admitting: Occupational Therapy

## 2017-05-12 ENCOUNTER — Ambulatory Visit: Payer: BC Managed Care – PPO | Attending: Nurse Practitioner | Admitting: Occupational Therapy

## 2017-05-12 DIAGNOSIS — M79641 Pain in right hand: Secondary | ICD-10-CM | POA: Diagnosis present

## 2017-05-12 DIAGNOSIS — R208 Other disturbances of skin sensation: Secondary | ICD-10-CM | POA: Insufficient documentation

## 2017-05-12 DIAGNOSIS — R278 Other lack of coordination: Secondary | ICD-10-CM | POA: Insufficient documentation

## 2017-05-12 DIAGNOSIS — M6281 Muscle weakness (generalized): Secondary | ICD-10-CM | POA: Diagnosis present

## 2017-05-12 DIAGNOSIS — I69315 Cognitive social or emotional deficit following cerebral infarction: Secondary | ICD-10-CM | POA: Insufficient documentation

## 2017-05-12 NOTE — Patient Instructions (Signed)
1. Grip Strengthening (Resistive Putty)   Squeeze putty using thumb and all fingers. Repeat _20___ times. Do __2__ sessions per day.   2. Roll putty into tube on table and pinch between each finger and thumb x 10 reps each. (can do ring and small finger together)     Copyright  VHI. All rights reserved.   

## 2017-05-12 NOTE — Therapy (Signed)
[facility] [address] [place], [place], [address] Phone: [phone]   Fax:  [phone]  Occupational Therapy Evaluation  Patient Details  Name: Lindsay Gomez MRN: [id number] Date of Birth: 25-Jul-1970 Referring Provider: Doreen Salvage NP   Encounter Date: 05/12/2017  OT End of Session - 05/12/17 1711    Visit Number  1    Number of Visits  6    Date for OT Re-Evaluation  06/26/17    Authorization Type  BCBS    OT Start Time  1534    OT Stop Time  1601    OT Time Calculation (min)  27 min       Past Medical History:  Diagnosis Date  . Allergy    rhinitis  . Anemia   . Anxiety   . Diabetes mellitus without complication ([place])   . Enlarged heart   . Hypertension   . Morbid obesity ([place])   . Shortness of breath dyspnea    with low iron    Past Surgical History:  Procedure Laterality Date  . ABDOMINAL HYSTERECTOMY N/A 01/29/2015   Procedure: TOTAL ABDOMINAL HYSTERECTOMY ;  Surgeon: Ena Dawley, MD;  Location: [facility];  Service: Gynecology;  Laterality: N/A;  . BILATERAL SALPINGECTOMY Bilateral 01/29/2015   Procedure: BILATERAL SALPINGECTOMY;  Surgeon: Ena Dawley, MD;  Location: [facility];  Service: Gynecology;  Laterality: Bilateral;  . CESAREAN SECTION    . KNEE SURGERY  1992   ? arthroscopic/ right    There were no vitals filed for this visit.  Subjective Assessment - 05/12/17 1538    Subjective   Pt reports numbness and tingling in RUE since CVA in December    Pertinent History  CVA 03/02/18, DM, HTN,, chronic R hip and low back pain     Patient Stated Goals  improve numbness and tingling in RUE and function     Currently in Pain?  Yes    Pain Score  5     Pain Location  Hand    Pain Orientation  Right    Pain Descriptors / Indicators  Tingling    Pain Type  Acute pain    Pain Onset  More than a month ago    Pain Frequency  Constant    Aggravating Factors   unknown    Pain  Relieving Factors  unknown        [facility] OT Assessment - 05/12/17 1543      Assessment   Medical Diagnosis  L CVA     Referring Provider  Doreen Salvage NP    Onset Date/Surgical Date  03/02/17    Hand Dominance  Right      Precautions   Precautions  Other (comment)    Precaution Comments  Decreased sensation on R side       Balance Screen   Has the patient fallen in the past 6 months  No    Has the patient had a decrease in activity level because of a fear of falling?   No    Is the patient reluctant to leave their home because of a fear of falling?   No      Home  Environment   Family/patient expects to be discharged to:  Private residence    Home Access  Stairs    Lives With  [name]  Full time employment  Vocation Lawyer (Kindergarten)    Leisure  Likes to go for walks, go to the park, go to the movies      ADL   Eating/Feeding  Independent    Grooming  Modified independent    Upper Body Dressing  Increased time;Independent    Lower Body Dressing  Increased time;Modified independent    Toilet Transfer  Modified independent    Toileting -  Hygiene  Modified Independent Pt. reports increased difficulty with hygeine    Tub/Shower Transfer  Modified independent    ADL comments  Pt reports difficulty typing, picking things up, thumbing through papers      IADL   Shopping  Takes care of all shopping needs independently    Meal Prep  Able to complete simple warm meal prep    Financial Management  Manages financial matters independently (budgets, writes checks, pays rent, bills goes to bank), collects and keeps track of income      Mobility   Mobility Status  Independent      Written Expression   Dominant Hand  Right    Handwriting  100% legible      Vision Assessment   Vision Assessment  Vision not tested      Cognition   Overall Cognitive Status  Impaired/Different from  baseline    Behaviors  -- Drowsy, eyes closed most of the eval, deceased  participation    Cognition Comments  Pt report short term memory changes,  fatigue,  cloudiness, foginess      Sensation   Light Touch  Impaired by gross assessment    Light Touch Impaired Details  Impaired RUE      Coordination   9 Hole Peg Test  Right;Left    Right 9 Hole Peg Test  21.87 secs    Left 9 Hole Peg Test  19.65 secs      ROM / Strength   AROM / PROM / Strength  AROM;Strength      AROM   Overall AROM   Within functional limits for tasks performed      Strength   Overall Strength  Deficits    Overall Strength Comments  RUE grossly 4/5 proximal and triceps, biceps 4+/5, LUE 5/5                      OT Education - 05/12/17 1710    Education provided  Yes    Education Details  role of OT, evaluation findings, red putty HEP    Person(s) Educated  Patient    Methods  Explanation;Demonstration;Verbal cues    Comprehension  Verbalized understanding;Returned demonstration          OT Long Term Goals - 05/12/17 1713      OT LONG TERM GOAL #1   Title  I with HEP    Time  6    Period  Weeks    Status  New    Target Date  06/26/17            Plan - 05/12/17 1713    Clinical Impression Statement   Pt presents s/p L thalamic CVA on 03/02/17 with increasing tingling and pain in RUE, decreased strength and mildly decreased coordination as well as pt reported cognitive deficits. Pt can benefit from skilled occupational therapy to maximize pt's safety and independence with ADLs/ IADLS. Pt was only scheduled for 1 follow up visit today and only 1 goal was set as pt does not seem fully  invested in participating in therapy at this time. (Pt initally requested to wait a full month before scheduling therapy visits).If pt demonstrates increased active participation in therapy additional goals will be set at that time.    Occupational Profile and client history currently impacting  functional performance  PMH:DM, HTN, CVA 12/18, chronic R hip and low back pain. Pt is currrently working as a Pharmacist, hospital. Pt was drowsy and maintained her eyes closed for much of the eval.    Occupational performance deficits (Please refer to evaluation for details):  IADL's;ADL's;Rest and Sleep;Work;Leisure;Social Participation    Rehab Potential  Fair    Current Impairments/barriers affecting progress:  cognitive deficits, decreased active participation in therapy/ decreased initiation    OT Frequency  -- eval +5 visits prn    OT Duration  8 weeks    OT Treatment/Interventions  Self-care/ADL training;Visual/perceptual remediation/compensation;Therapeutic exercise;Patient/family education;Paraffin;Neuromuscular education;Moist Heat;Therapeutic activities;Functional Mobility Training;Energy conservation;Fluidtherapy;Ultrasound;DME and/or AE instruction;Manual Therapy;Passive range of motion;Cognitive remediation/compensation    Plan  theraband HEP, review putty, memory cognitive strategies, d/c vs schedule additional visits dependeing on pt participation    Clinical Decision Making  Limited treatment options, no task modification necessary    Consulted and Agree with Plan of Care  Patient       Patient will benefit from skilled therapeutic intervention in order to improve the following deficits and impairments:  Decreased cognition, Pain, Impaired sensation, Decreased coordination, Decreased activity tolerance, Decreased endurance, Decreased strength, Impaired UE functional use, Impaired perceived functional ability, Decreased safety awareness  Visit Diagnosis: Other disturbances of skin sensation  Pain in right hand  Muscle weakness (generalized)  Other lack of coordination    Problem List Patient Active Problem List   Diagnosis Date Noted  . Cerebrovascular accident (CVA) ([place]) 03/14/2017  . Right hip pain 03/14/2017  . Numbness 03/03/2017  . Hypocalcemia 03/03/2017  . Cerebral  thrombosis with cerebral infarction 03/03/2017  . Major depressive disorder, single episode, moderate ([place]) 10/28/2015  . Microcytic hypochromic anemia 07/29/2015  . Type 2 diabetes mellitus ([place]) 06/24/2015  . Depression 06/24/2015  . Polyuria 06/17/2015  . Obstructive sleep apnea 06/17/2015  . Anxiety and depression 04/24/2015  . Pre-op evaluation 01/09/2015  . Hyperlipidemia 01/06/2013  . Right lumbar radiculopathy 10/26/2012  . ANA positive 09/14/2012  . LVH (left ventricular hypertrophy) 12/08/2011  . Menorrhagia 09/24/2011  . Routine general medical examination at a health care facility 09/24/2011  . Carpal tunnel syndrome of right wrist 09/18/2010  . Hypokalemia 09/18/2010  . Morbid obesity ([place]) 11/29/2008  . Iron deficiency anemia 11/28/2008  . Essential hypertension 11/28/2008  . ALLERGIC RHINITIS 11/28/2008    [facility] 05/12/2017, 5:32 PM Theone Murdoch, OTR/L [phone] Phone: [phone] 5:34 PM 05/12/17 [facility] [address] [place], [place], [address] Phone: [phone]   Fax:  [phone]  Name: Lindsay Gomez MRN: [id number] Date of Birth: Jun 19, 1970

## 2017-05-13 ENCOUNTER — Encounter: Payer: BC Managed Care – PPO | Attending: Nurse Practitioner | Admitting: Registered"

## 2017-05-13 ENCOUNTER — Encounter: Payer: Self-pay | Admitting: Registered"

## 2017-05-13 DIAGNOSIS — I10 Essential (primary) hypertension: Secondary | ICD-10-CM | POA: Insufficient documentation

## 2017-05-13 DIAGNOSIS — Z713 Dietary counseling and surveillance: Secondary | ICD-10-CM | POA: Insufficient documentation

## 2017-05-13 DIAGNOSIS — E1159 Type 2 diabetes mellitus with other circulatory complications: Secondary | ICD-10-CM | POA: Diagnosis present

## 2017-05-13 DIAGNOSIS — E785 Hyperlipidemia, unspecified: Secondary | ICD-10-CM | POA: Insufficient documentation

## 2017-05-13 DIAGNOSIS — E118 Type 2 diabetes mellitus with unspecified complications: Secondary | ICD-10-CM

## 2017-05-13 NOTE — Patient Instructions (Addendum)
Consider having vitamin D checked You could try taking vitamin B complex to see if that helps with energy Consider doing a few minutes of arm chair exercises as tolerated. Continue to have balanced meals and snacks.

## 2017-05-13 NOTE — Progress Notes (Signed)
Diabetes Self-Management Education  Visit Type: First/Initial  Appt. Start Time: 1530 Appt. End Time: 1630  05/13/2017  [name], identified by name and date of birth, is a 47 y.o. female with a diagnosis of Diabetes: Type 2.   ASSESSMENT Patient states she wants some easy ideas for meals to cook since she doesn't have energy to spend a lot of time cooking. Patient states she has a quick cooker and enjoys eating food she prepares and prefers it to the school cafeteria food. Patient used Meal Prep ideas sheet to create ideas for 7 meals and 4 snacks. Patient also wrote down FDA website with easy menu ideas, "What's Cooking?"  Patient has changed diet, cut out soda, eats less fast food, eating more vegetables. Patient is has been checking blood sugar every hour with the [facility] and discovered that [place] Mike steak & cheese sub and McDonalds biscuits raise her blood sugar so she rarely eats those now. Patient does enjoy cranberry mixed with other juices, but does not drink them often.  Patient states she had a stroke in December and will be following-up with a neurologist due to right side still having numbness and tingling sensation. Patient has neuropathy but stopped gabapentin because of side effects.  Pt states she has extreme fatigue, only has enough energy to work and then sleeps when she gets home. Her sleep pattern is to sleep 5-10 pm, 12-2, 5:30 am until get up for work. Patient states she has gym membership and enjoys it but lately she is too tired to workout. Patient was yawning and had to close eyes during most of appointment, laid head on my desk while I was preparing AVS.   Last menstrual period 12/18/2014. There is no height or weight on file to calculate BMI.  Diabetes Self-Management Education - 05/13/17 1522      Visit Information   Visit Type  First/Initial      Initial Visit   Diabetes Type  Type 2    Are you currently following a meal plan?  No    Date Diagnosed  2016      Health Coping   How would you rate your overall health?  Poor      Psychosocial Assessment   Patient Belief/Attitude about Diabetes  Other (comment) motivated to manage, afraid    How often do you need to have someone help you when you read instructions, pamphlets, or other written materials from your doctor or pharmacy?  1 - Never    What is the last grade level you completed in school?  masters degree      Complications   Last HgB A1C per patient/outside source  12.7 % Dec 2018    How often do you check your blood sugar?  -- every hour with CGM    Fasting Blood glucose range (mg/dL)  70-129;130-179 120s- 130s    Postprandial Blood glucose range (mg/dL)  >200 220-230    Number of hypoglycemic episodes per month  0    Number of hyperglycemic episodes per week  -- every day    Have you had a dilated eye exam in the past 12 months?  No    Have you had a dental exam in the past 12 months?  No    Are you checking your feet?  Yes    How many days per week are you checking your feet?  7      Dietary Intake   Breakfast  oatmeal (McDonalds), water  Snack (morning)  none    Lunch  chicken bbq sand, broccoli, pears, water OR leftovers    Snack (afternoon)  celery PB or apple, PB oR meat, crackers    Dinner  baked [place] wings, green beans, baked potato OR apple sauce, bologne sandwich on honey wheat    Snack (evening)  none     Beverage(s)  water, soda 1x week, 2-3x week cranberry, pineapple mango.      Exercise   Exercise Type  ADL's    How many days per week to you exercise?  0    How many minutes per day do you exercise?  0    Total minutes per week of exercise  0      Patient Education   Previous Diabetes Education  Yes (please comment) NDES 2016    Nutrition management   Role of diet in the treatment of diabetes and the relationship between the three main macronutrients and blood glucose level    Chronic complications  Assessed and discussed foot care  and prevention of foot problems    Psychosocial adjustment  Role of stress on diabetes      Individualized Goals (developed by patient)   Nutrition  General guidelines for healthy choices and portions discussed      Outcomes   Expected Outcomes  Demonstrated interest in learning. Expect positive outcomes    Future DMSE  PRN    Program Status  Completed     Individualized Plan for Diabetes Self-Management Training:   Learning Objective:  Patient will have a greater understanding of diabetes self-management. Patient education plan is to attend individual and/or group sessions per assessed needs and concerns.   Patient Instructions  Consider having vitamin D checked You could try taking vitamin B complex to see if that helps with energy Consider doing a few minutes of arm chair exercises as tolerated. Continue to have balanced meals and snacks.  Expected Outcomes:  Demonstrated interest in learning. Expect positive outcomes  Education material provided: Low sodium seasoning ideas, Meal Prep sheet, Arm chair exercises.  If problems or questions, patient to contact team via:  Phone and MyChart  Future DSME appointment: PRN

## 2017-05-20 ENCOUNTER — Emergency Department (HOSPITAL_COMMUNITY)
Admission: EM | Admit: 2017-05-20 | Discharge: 2017-05-20 | Disposition: A | Discharge status: Home / Self Care | Payer: BC Managed Care – PPO | Attending: Emergency Medicine | Admitting: Emergency Medicine

## 2017-05-20 ENCOUNTER — Telehealth: Payer: Self-pay

## 2017-05-20 ENCOUNTER — Ambulatory Visit: Payer: BC Managed Care – PPO | Attending: Nurse Practitioner | Admitting: Occupational Therapy

## 2017-05-20 VITALS — BP 154/111

## 2017-05-20 DIAGNOSIS — M6281 Muscle weakness (generalized): Secondary | ICD-10-CM | POA: Insufficient documentation

## 2017-05-20 DIAGNOSIS — Z5321 Procedure and treatment not carried out due to patient leaving prior to being seen by health care provider: Secondary | ICD-10-CM | POA: Diagnosis not present

## 2017-05-20 DIAGNOSIS — M79641 Pain in right hand: Secondary | ICD-10-CM | POA: Diagnosis present

## 2017-05-20 DIAGNOSIS — R208 Other disturbances of skin sensation: Secondary | ICD-10-CM | POA: Diagnosis present

## 2017-05-20 DIAGNOSIS — I69315 Cognitive social or emotional deficit following cerebral infarction: Secondary | ICD-10-CM | POA: Diagnosis present

## 2017-05-20 DIAGNOSIS — R278 Other lack of coordination: Secondary | ICD-10-CM | POA: Insufficient documentation

## 2017-05-20 DIAGNOSIS — Z0130 Encounter for examination of blood pressure without abnormal findings: Secondary | ICD-10-CM | POA: Insufficient documentation

## 2017-05-20 NOTE — Telephone Encounter (Signed)
Recd call from neuro rehab---patient was at visit with them and was stating that her novolog had been recently changed by [facility] endo Dr. Cherre Blanc that change, her sugars have been running higher (could not give me a number) and she has headache---patient is in neuro rehab due to previous stroke a few months back---she still has right side numbness that she is getting outpt therapy for---patient advised to call endo doctor that changed her novolog to get advisement on any dosage changes needed and to go to ED if she thinks she is in danger of another stroke or if she cant reach endo doctor today---sugars may continue to get higher thru the night and she may need to get sugars under control today---patient agreed to call and will go to ED if needed

## 2017-05-20 NOTE — Therapy (Signed)
[facility] [address] [place], [place], [address] Phone: [phone]   Fax:  [phone]  Occupational Therapy Treatment  Patient Details  Name: Lindsay Gomez MRN: [id number] Date of Birth: 12/13/1970 Referring Provider: Doreen Salvage NP   Encounter Date: 05/20/2017  OT End of Session - 05/20/17 1540    Visit Number  2    Number of Visits  6    Date for OT Re-Evaluation  06/26/17    Authorization Type  BCBS    OT Start Time  1533 2 units only due to medical issues    OT Stop Time  1615    OT Time Calculation (min)  42 min    Behavior During Therapy  [facility] for tasks assessed/performed       Past Medical History:  Diagnosis Date  . Allergy    rhinitis  . Anemia   . Anxiety   . Diabetes mellitus without complication ([place])   . Enlarged heart   . Hypertension   . Morbid obesity ([place])   . Shortness of breath dyspnea    with low iron    Past Surgical History:  Procedure Laterality Date  . ABDOMINAL HYSTERECTOMY N/A 01/29/2015   Procedure: TOTAL ABDOMINAL HYSTERECTOMY ;  Surgeon: Ena Dawley, MD;  Location: [facility];  Service: Gynecology;  Laterality: N/A;  . BILATERAL SALPINGECTOMY Bilateral 01/29/2015   Procedure: BILATERAL SALPINGECTOMY;  Surgeon: Ena Dawley, MD;  Location: [facility];  Service: Gynecology;  Laterality: Bilateral;  . CESAREAN SECTION    . KNEE SURGERY  1992   ? arthroscopic/ right    Vitals:   05/20/17 1538  BP: (!) 154/111    Subjective Assessment - 05/20/17 1538    Pertinent History  CVA 03/02/18, DM, HTN,, chronic R hip and low back pain     Patient Stated Goals  improve numbness and tingling in RUE and function     Currently in Pain?  Yes    Pain Score  5     Pain Location  Hand    Pain Orientation  Right    Pain Descriptors / Indicators  Tingling    Pain Type  Acute pain    Pain Onset  More than a month ago    Pain Frequency  Constant    Aggravating Factors   use     Pain Relieving Factors  unknown    Multiple Pain Sites  No                Treatment: Therapsit educated pt in CVA warning signs and symptoms she verbalized understanding.           OT Education - 05/20/17 1638    Education provided  Yes    Education Details  red theraband HEP, coordination HEP, review of putty HEP    Person(s) Educated  Patient    Methods  Explanation;Demonstration;Verbal cues    Comprehension  Verbalized understanding;Returned demonstration;Verbal cues required          OT Long Term Goals - 05/20/17 1643      OT LONG TERM GOAL #1   Title  I with HEP    Time  6    Period  Weeks    Status  New            Plan - 05/20/17 1639    Clinical Impression Statement  Pt demonstrates understanding of HEP. Pt reports elevated blood sugars and she demonstrated elevated BP 15/111. Therapsit contacted pt's  PCP and pt discussed with triage nurse.    Occupational performance deficits (Please refer to evaluation for details):  IADL's;ADL's;Rest and Sleep;Work;Leisure;Social Participation    Rehab Potential  Fair    Current Impairments/barriers affecting progress:  cognitive deficits, decreased active participation in therapy/ decreased initiation    OT Frequency  -- 5 visits    OT Duration  8 weeks    OT Treatment/Interventions  Self-care/ADL training;Visual/perceptual remediation/compensation;Therapeutic exercise;Patient/family education;Paraffin;Neuromuscular education;Moist Heat;Therapeutic activities;Functional Mobility Training;Energy conservation;Fluidtherapy;Ultrasound;DME and/or AE instruction;Manual Therapy;Passive range of motion;Cognitive remediation/compensation    Plan  d/c OT per pt request, she plans to work on exercises at home. Pt's PCP instructed her to call her endocrinologist, pt reports she plans to.    Clinical Decision Making  Limited treatment options, no task modification necessary    Consulted and Agree with Plan of Care   Patient       Patient will benefit from skilled therapeutic intervention in order to improve the following deficits and impairments:  Decreased cognition, Pain, Impaired sensation, Decreased coordination, Decreased activity tolerance, Decreased endurance, Decreased strength, Impaired UE functional use, Impaired perceived functional ability, Decreased safety awareness  Visit Diagnosis: Other disturbances of skin sensation  Pain in right hand  Muscle weakness (generalized)  Other lack of coordination  Cognitive social or emotional deficit following cerebral infarction   OCCUPATIONAL THERAPY DISCHARGE SUMMARY    Current functional level related to goals / functional outcomes: Pt met goal. Only 1 goals was set as pt does not seem fully invested in therapy at this time. Pt plans to perform exercises at home.   Remaining deficits: Pain, decreased strength, decreased coordination  Education / Equipment: Pt was educated regarding HEP and CVA warning signs. Pt verbalized understanding.  Plan: Patient agrees to discharge.  Patient goals were met. Patient is being discharged due to the patient's request.  ?????      Problem List Patient Active Problem List   Diagnosis Date Noted  . Cerebrovascular accident (CVA) ([employer]) 03/14/2017  . Right hip pain 03/14/2017  . Numbness 03/03/2017  . Hypocalcemia 03/03/2017  . Cerebral thrombosis with cerebral infarction 03/03/2017  . Major depressive disorder, single episode, moderate ([place]) 10/28/2015  . Microcytic hypochromic anemia 07/29/2015  . Type 2 diabetes mellitus ([place]) 06/24/2015  . Depression 06/24/2015  . Polyuria 06/17/2015  . Obstructive sleep apnea 06/17/2015  . Anxiety and depression 04/24/2015  . Pre-op evaluation 01/09/2015  . Hyperlipidemia 01/06/2013  . Right lumbar radiculopathy 10/26/2012  . ANA positive 09/14/2012  . LVH (left ventricular hypertrophy) 12/08/2011  . Menorrhagia 09/24/2011  . Routine general medical  examination at a health care facility 09/24/2011  . Carpal tunnel syndrome of right wrist 09/18/2010  . Hypokalemia 09/18/2010  . Morbid obesity ([place]) 11/29/2008  . Iron deficiency anemia 11/28/2008  . Essential hypertension 11/28/2008  . ALLERGIC RHINITIS 11/28/2008    Kay[facility] Ripp 05/20/2017, 4:45 PM Theone Murdoch, OTR/L [phone] Phone: [phone] 4:47 PM 05/20/17 [facility] [address] [place], [place], [address] Phone: [phone]   Fax:  [phone]  Name: Lindsay Gomez MRN: [id number] Date of Birth: March 09, 1971

## 2017-05-20 NOTE — Patient Instructions (Signed)
  Strengthening: Resisted Flexion   Hold tubing with _right____ arm(s) at side. Pull forward and up. Move shoulder through pain-free range of motion. Repeat __10__ times per set.  Do _1-2_ sessions per day , every other day   Strengthening: Resisted Extension   Hold tubing in _right____ hand(s), arm forward. Pull arm back, elbow straight. Repeat _10___ times per set. Do _1-2___ sessions per day, every other day.   Resisted Horizontal Abduction: Bilateral   Sit or stand, tubing in both hands, arms out in front. Keeping arms straight, pinch shoulder blades together and stretch arms out. Repeat _10___ times per set. Do _1-2___ sessions per day, every other day.     Coordination Activities  Perform the following activities for 20 minutes 1 times per day with right hand(s).   Deal cards with your thumb (Hold deck in hand and push card off top with thumb).  Pick up coins and place in container or coin bank.  Pick up coins and stack.  Pick up coins one at a time until you get 5-10 in your hand, then move coins from palm to fingertips to stack one at a time.  Practice writing and/or typing.   Elbow Flexion: Resisted   With tubing held in _right_____ hand(s) and other end secured under foot, curl arm up as far as possible. Repeat _10___ times per set. Do _1-2___ sessions per day, every other day.    Elbow Extension: Resisted   Sit in chair with resistive band secured at armrest (or hold with other hand) and _right_____ elbow bent. Straighten elbow. Repeat _10___ times per set.  Do _1-2___ sessions per day, every other day.   Copyright  VHI. All rights reserved.

## 2017-05-20 NOTE — Therapy (Deleted)
[facility] [address] [place], [place], [address] Phone: [phone]   Fax:  [phone]  Occupational Therapy Evaluation  Patient Details  Name: Lindsay Gomez MRN: [id number] Date of Birth: April 25, 1970 Referring Provider: Doreen Salvage NP   Encounter Date: 05/20/2017  OT End of Session - 05/20/17 1540    Visit Number  2    Number of Visits  6    Date for OT Re-Evaluation  06/26/17    Authorization Type  [employer]    OT Start Time  1533 2 units only due to medical issues    OT Stop Time  1615    OT Time Calculation (min)  42 min    Behavior During Therapy  [facility] for tasks assessed/performed       Past Medical History:  Diagnosis Date  . Allergy    rhinitis  . Anemia   . Anxiety   . Diabetes mellitus without complication ([place])   . Enlarged heart   . Hypertension   . Morbid obesity ([place])   . Shortness of breath dyspnea    with low iron    Past Surgical History:  Procedure Laterality Date  . ABDOMINAL HYSTERECTOMY N/A 01/29/2015   Procedure: TOTAL ABDOMINAL HYSTERECTOMY ;  Surgeon: Ena Dawley, MD;  Location: [facility];  Service: Gynecology;  Laterality: N/A;  . BILATERAL SALPINGECTOMY Bilateral 01/29/2015   Procedure: BILATERAL SALPINGECTOMY;  Surgeon: Ena Dawley, MD;  Location: [facility];  Service: Gynecology;  Laterality: Bilateral;  . CESAREAN SECTION    . KNEE SURGERY  1992   ? arthroscopic/ right    Vitals:   05/20/17 1538  BP: (!) 154/111    Subjective Assessment - 05/20/17 1538    Pertinent History  CVA 03/02/18, DM, HTN,, chronic R hip and low back pain     Patient Stated Goals  improve numbness and tingling in RUE and function     Currently in Pain?  Yes    Pain Score  5     Pain Location  Hand    Pain Orientation  Right    Pain Descriptors / Indicators  Tingling    Pain Type  Acute pain    Pain Onset  More than a month ago    Pain Frequency  Constant    Aggravating Factors   use     Pain Relieving Factors  unknown    Multiple Pain Sites  No                         OT Education - 05/20/17 1638    Education provided  Yes    Education Details  red theraband HEP, coordination HEP, review of putty HEP    Person(s) Educated  Patient    Methods  Explanation;Demonstration;Verbal cues    Comprehension  Verbalized understanding;Returned demonstration;Verbal cues required          OT Long Term Goals - 05/20/17 1643      OT LONG TERM GOAL #1   Title  I with HEP    Time  6    Period  Weeks    Status  New            Plan - 05/20/17 1639    Clinical Impression Statement  Pt demonstrates understanding of HEP. Pt reports elevated blood sugars and she demonstrated elevated BP 15/111. Therapsit contacted pt's PCP and pt discussed with triage nurse.    Occupational performance deficits (Please  refer to evaluation for details):  IADL's;ADL's;Rest and Sleep;Work;Leisure;Social Participation    Rehab Potential  Fair    Current Impairments/barriers affecting progress:  cognitive deficits, decreased active participation in therapy/ decreased initiation    OT Frequency  -- 5 visits    OT Duration  8 weeks    OT Treatment/Interventions  Self-care/ADL training;Visual/perceptual remediation/compensation;Therapeutic exercise;Patient/family education;Paraffin;Neuromuscular education;Moist Heat;Therapeutic activities;Functional Mobility Training;Energy conservation;Fluidtherapy;Ultrasound;DME and/or AE instruction;Manual Therapy;Passive range of motion;Cognitive remediation/compensation    Plan  d/c OT per pt request, she plans to work on exercises at home. Pt's PCP instructed her to call her endocrinologist, pt reports she plans to.    Clinical Decision Making  Limited treatment options, no task modification necessary    Consulted and Agree with Plan of Care  Patient       Patient will benefit from skilled therapeutic intervention in order to improve the  following deficits and impairments:  Decreased cognition, Pain, Impaired sensation, Decreased coordination, Decreased activity tolerance, Decreased endurance, Decreased strength, Impaired UE functional use, Impaired perceived functional ability, Decreased safety awareness  Visit Diagnosis: Other disturbances of skin sensation  Pain in right hand  Muscle weakness (generalized)  Other lack of coordination  Cognitive social or emotional deficit following cerebral infarction    Problem List Patient Active Problem List   Diagnosis Date Noted  . Cerebrovascular accident (CVA) ([place]) 03/14/2017  . Right hip pain 03/14/2017  . Numbness 03/03/2017  . Hypocalcemia 03/03/2017  . Cerebral thrombosis with cerebral infarction 03/03/2017  . Major depressive disorder, single episode, moderate ([place]) 10/28/2015  . Microcytic hypochromic anemia 07/29/2015  . Type 2 diabetes mellitus ([place]) 06/24/2015  . Depression 06/24/2015  . Polyuria 06/17/2015  . Obstructive sleep apnea 06/17/2015  . Anxiety and depression 04/24/2015  . Pre-op evaluation 01/09/2015  . Hyperlipidemia 01/06/2013  . Right lumbar radiculopathy 10/26/2012  . ANA positive 09/14/2012  . LVH (left ventricular hypertrophy) 12/08/2011  . Menorrhagia 09/24/2011  . Routine general medical examination at a health care facility 09/24/2011  . Carpal tunnel syndrome of right wrist 09/18/2010  . Hypokalemia 09/18/2010  . Morbid obesity ([place]) 11/29/2008  . Iron deficiency anemia 11/28/2008  . Essential hypertension 11/28/2008  . ALLERGIC RHINITIS 11/28/2008    Chaneka Trefz 05/20/2017, 4:44 PM  [facility] [address] [place], [place], [address] Phone: [phone]   Fax:  [phone]  Name: Lindsay Gomez MRN: [id number] Date of Birth: 01-09-1971

## 2017-05-20 NOTE — ED Notes (Signed)
Pt stated that she just wants her blood pressure check, writer checked her bp 149/87, and pt stated she was going to leave bc that's all she wanted done.  Writer informed RN

## 2017-06-21 ENCOUNTER — Ambulatory Visit: Payer: Self-pay | Admitting: *Deleted

## 2017-06-21 ENCOUNTER — Ambulatory Visit: Payer: Self-pay | Admitting: Family

## 2017-06-21 DIAGNOSIS — Z0289 Encounter for other administrative examinations: Secondary | ICD-10-CM

## 2017-06-21 NOTE — Telephone Encounter (Signed)
Pt states that her blood sugar was elevated this morning 340's at 0500 and 363 at 0700, and 384 at 0920; she states that when she took it yesterday it read "high" and she took 14 novolog to bring it down; the pt also complains of headaches and the one on Friday made her right eye twitch; she also complains of intermittent chest tightness; recommendations made per nurse triage protocol; pt normally sees Caesar Chestnut but she has no availability today; pt offered and accepted appointment with Jodi Mourning, [facility], today at 1600; she verbalizes understanding; spoke with Gareth Eagle regarding this appointment; will route to office for notification of this upcoming appointment.  Reason for Disposition . [1] Blood glucose > 300 mg/dl (16.5 mmol/l) AND [2] two or more times in a row  Answer Assessment - Initial Assessment Questions 1. BLOOD GLUCOSE: "What is your blood glucose level?"      384 2. ONSET: "When did you check the blood glucose?"     0920 3. USUAL RANGE: "What is your glucose level usually?" (e.g., usual fasting morning value, usual evening value)     180-200's fasting 4. KETONES: "Do you check for ketones (urine or blood test strips)?" If yes, ask: "What does the test show now?"      no 5. TYPE 1 or 2:  "Do you know what type of diabetes you have?"  (e.g., Type 1, Type 2, Gestational; doesn't know)     Type 2 6. INSULIN: "Do you take insulin?" If yes, ask: "Have you missed any shots recently?"     Yes; takes novolog and basilar; she states that she is out of basilar (last took 2 weeks ago) 7. DIABETES PILLS: "Do you take any pills for your diabetes?" If yes, ask: "Have you missed taking any pills recently?"     no 8. OTHER SYMPTOMS: "Do you have any symptoms?" (e.g., fever, frequent urination, difficulty breathing, dizziness, weakness, vomiting)     Frequent urination (almost every hour on the hour), "crazy texture boo- boo", 9. PREGNANCY: "Is there any chance you are pregnant?" "When  was your last menstrual period?"     No hysterectomy  Protocols used: DIABETES - HIGH BLOOD SUGAR-A-AH

## 2017-06-21 NOTE — Telephone Encounter (Signed)
FYI

## 2017-06-23 ENCOUNTER — Ambulatory Visit: Payer: BC Managed Care – PPO | Admitting: Family

## 2017-06-23 ENCOUNTER — Encounter: Payer: Self-pay | Admitting: Family

## 2017-06-23 ENCOUNTER — Other Ambulatory Visit (INDEPENDENT_AMBULATORY_CARE_PROVIDER_SITE_OTHER): Payer: BC Managed Care – PPO

## 2017-06-23 VITALS — BP 140/88 | HR 95 | Temp 99.2°F | Ht 62.0 in | Wt 290.0 lb

## 2017-06-23 DIAGNOSIS — Z794 Long term (current) use of insulin: Secondary | ICD-10-CM | POA: Diagnosis not present

## 2017-06-23 DIAGNOSIS — E1169 Type 2 diabetes mellitus with other specified complication: Secondary | ICD-10-CM | POA: Diagnosis not present

## 2017-06-23 DIAGNOSIS — E785 Hyperlipidemia, unspecified: Secondary | ICD-10-CM

## 2017-06-23 DIAGNOSIS — E1165 Type 2 diabetes mellitus with hyperglycemia: Secondary | ICD-10-CM

## 2017-06-23 LAB — COMPREHENSIVE METABOLIC PANEL
ALT: 128 U/L — ABNORMAL HIGH (ref 0–35)
AST: 99 U/L — ABNORMAL HIGH (ref 0–37)
Albumin: 3.9 g/dL (ref 3.5–5.2)
Alkaline Phosphatase: 91 U/L (ref 39–117)
BUN: 8 mg/dL (ref 6–23)
CO2: 28 mEq/L (ref 19–32)
Calcium: 9.2 mg/dL (ref 8.4–10.5)
Chloride: 94 mEq/L — ABNORMAL LOW (ref 96–112)
Creatinine, Ser: 0.81 mg/dL (ref 0.40–1.20)
GFR: 98.41 mL/min (ref >60.00)
Glucose, Bld: 460 mg/dL — ABNORMAL HIGH (ref 70–99)
Potassium: 3.4 mEq/L — ABNORMAL LOW (ref 3.5–5.1)
Sodium: 130 mEq/L — ABNORMAL LOW (ref 135–145)
Total Bilirubin: 0.5 mg/dL (ref 0.2–1.2)
Total Protein: 7.4 g/dL (ref 6.0–8.3)

## 2017-06-23 MED ORDER — ATORVASTATIN CALCIUM 40 MG PO TABS: 40.0000 mg | ORAL_TABLET | Freq: Every day | ORAL | 3 refills | Status: DC

## 2017-06-23 MED ORDER — BASAGLAR KWIKPEN 100 UNIT/ML ~~LOC~~ SOPN: 56.0000 [IU] | PEN_INJECTOR | Freq: Every day | SUBCUTANEOUS | 0 refills | Status: DC

## 2017-06-23 NOTE — Progress Notes (Signed)
Lindsay Gomez is a 47 y.o. female with the following history as recorded in EpicCare:  Patient Active Problem List   Diagnosis Date Noted  . Cerebrovascular accident (CVA) ([place]) 03/14/2017  . Right hip pain 03/14/2017  . Numbness 03/03/2017  . Hypocalcemia 03/03/2017  . Cerebral thrombosis with cerebral infarction 03/03/2017  . Major depressive disorder, single episode, moderate ([place]) 10/28/2015  . Microcytic hypochromic anemia 07/29/2015  . Type 2 diabetes mellitus ([place]) 06/24/2015  . Depression 06/24/2015  . Polyuria 06/17/2015  . Obstructive sleep apnea 06/17/2015  . Anxiety and depression 04/24/2015  . Pre-op evaluation 01/09/2015  . Hyperlipidemia 01/06/2013  . Right lumbar radiculopathy 10/26/2012  . ANA positive 09/14/2012  . LVH (left ventricular hypertrophy) 12/08/2011  . Menorrhagia 09/24/2011  . Routine general medical examination at a health care facility 09/24/2011  . Carpal tunnel syndrome of right wrist 09/18/2010  . Hypokalemia 09/18/2010  . Morbid obesity ([place]) 11/29/2008  . Iron deficiency anemia 11/28/2008  . Essential hypertension 11/28/2008  . ALLERGIC RHINITIS 11/28/2008    Current Outpatient Medications  Medication Sig Dispense Refill  . aspirin EC 325 MG EC tablet Take 1 tablet (325 mg total) by mouth daily. 30 tablet 0  . Azilsartan-Chlorthalidone (EDARBYCLOR) 40-25 MG TABS Take 1 tablet by mouth daily. 90 tablet 0  . Blood Glucose Monitoring Suppl (ONE TOUCH ULTRA MINI) w/Device KIT Use device to test blood sugars 1-4 times daily as instructed. 1 each 0  . CINNAMON PO Take by mouth.    . Continuous Blood Gluc Sensor MISC 1 each by Does not apply route as directed. Use as directed every 10 days. May dispense FreeStyle [employer] or similar. Patient has reader and will need prescription for 3-day sensors 3 each 1  . glucose blood test strip Use as instructed 100 each 12  . Insulin Glargine (BASAGLAR KWIKPEN) 100 UNIT/ML SOPN Inject 0.56 mLs  (56 Units total) into the skin daily at 10 pm. 5 pen 0  . Insulin Pen Needle (BD PEN NEEDLE NANO U/F) 32G X 4 MM MISC USE ONCE DAILY AS DIRECTED. 100 each 0  . Lancets MISC Use one lancet per test to check blood sugar 1-4 times daily. Dx code:E11.9 100 each 5  . Multiple Vitamin (MULTIVITAMIN) capsule Take 1 capsule by mouth daily.    . potassium chloride SA (K-DUR,KLOR-CON) 20 MEQ tablet Take 1 tablet (20 mEq total) by mouth 2 (two) times daily. 60 tablet 0  . atorvastatin (LIPITOR) 40 MG tablet Take 1 tablet (40 mg total) by mouth daily. 30 tablet 3   No current facility-administered medications for this visit.     Allergies: Lisinopril  Past Medical History:  Diagnosis Date  . Allergy    rhinitis  . Anemia   . Anxiety   . Diabetes mellitus without complication ([place])   . Enlarged heart   . Hypertension   . Morbid obesity ([place])   . Shortness of breath dyspnea    with low iron    Past Surgical History:  Procedure Laterality Date  . ABDOMINAL HYSTERECTOMY N/A 01/29/2015   Procedure: TOTAL ABDOMINAL HYSTERECTOMY ;  Surgeon: Ena Dawley, MD;  Location: [facility];  Service: Gynecology;  Laterality: N/A;  . BILATERAL SALPINGECTOMY Bilateral 01/29/2015   Procedure: BILATERAL SALPINGECTOMY;  Surgeon: Ena Dawley, MD;  Location: [facility];  Service: Gynecology;  Laterality: Bilateral;  . CESAREAN SECTION    . KNEE SURGERY  1992   ? arthroscopic/ right    Family History  Problem Relation Age of Onset  . Hypertension Mother   . Diabetes Mother   . Coronary artery disease Father   . Heart failure Father   . Heart attack Father   . Diabetes Father   . Hypertension Other   . Hyperlipidemia Other     Social History   Tobacco Use  . Smoking status: Never Smoker  . Smokeless tobacco: Never Used  Substance Use Topics  . Alcohol use: No    Subjective:  Patient presents with concerns about uncontrolled blood sugar; is under the care of endocrinology; last saw her endocrinologist  in February 2019; at that appointment, was taken off Glipizide and Trulicity and changed to [employer]; notes that she has been off the [employer] for almost 2 weeks now; admits that she is concerned that she was not doing well on combination of Basaglar and Novolog however; has not contacted her endocrinologist with any of these concerns. Has not contacted her endocrinologist to get refills on her medications; notes that her Hgba1c averages around 12; admits she is not exercising or eating well; admits she is tired and just not feeling well in general.   Objective:  Vitals:   06/23/17 1552  BP: 140/88  Pulse: 95  Temp: 99.2 F (37.3 C)  TempSrc: Oral  SpO2: 99%  Weight: 290 lb (131.5 kg)  Height: 5' 2"  (1.575 m)    General: Well developed, well nourished, in no acute distress  Skin : Warm and dry.  Head: Normocephalic and atraumatic  Eyes: Sclera and conjunctiva clear; pupils round and reactive to light; extraocular movements intact  Ears: External normal; canals clear; tympanic membranes normal  Oropharynx: Pink, supple. No suspicious lesions  Neck: Supple without thyromegaly, adenopathy  Lungs: Respirations unlabored; clear to auscultation bilaterally without wheeze, rales, rhonchi  CVS exam: normal rate and regular rhythm.  Neurologic: Alert and oriented; speech intact; face symmetrical; moves all extremities well; CNII-XII intact without focal deficit  Assessment:  1. Uncontrolled type 2 diabetes mellitus with hyperglycemia ([place])   2. Hyperlipidemia, unspecified hyperlipidemia type   3. Type 2 diabetes mellitus with other specified complication, with long-term current use of insulin ([place])     Plan:  Patient has confusion over who is managing her diabetes; notes from PCP say endocrine but patient says she does not have any follow-up there; blood sugar is up as patient is off her Basaglar- will refill that medication for her; encouraged patient to not be off her medication  for extended period of time- needs to be proactive about calling for refills; will talk to patient's PCP when she is back in the office to clarify who is managing patient's diabetes.   No follow-ups on file.  Orders Placed This Encounter  Procedures  . Comp Met (CMET)    Standing Status:   Future    Number of Occurrences:   1    Standing Expiration Date:   06/23/2018    Requested Prescriptions   Signed Prescriptions Disp Refills  . atorvastatin (LIPITOR) 40 MG tablet 30 tablet 3    Sig: Take 1 tablet (40 mg total) by mouth daily.  . Insulin Glargine (BASAGLAR KWIKPEN) 100 UNIT/ML SOPN 5 pen 0    Sig: Inject 0.56 mLs (56 Units total) into the skin daily at 10 pm.

## 2017-06-25 ENCOUNTER — Other Ambulatory Visit: Payer: Self-pay | Admitting: Family

## 2017-06-25 DIAGNOSIS — R945 Abnormal results of liver function studies: Principal | ICD-10-CM

## 2017-06-25 DIAGNOSIS — E871 Hypo-osmolality and hyponatremia: Secondary | ICD-10-CM

## 2017-06-25 DIAGNOSIS — R7989 Other specified abnormal findings of blood chemistry: Secondary | ICD-10-CM

## 2017-06-30 ENCOUNTER — Encounter: Payer: Self-pay | Admitting: Gastroenterology

## 2017-07-09 ENCOUNTER — Encounter: Payer: Self-pay | Admitting: Neurology

## 2017-07-09 ENCOUNTER — Other Ambulatory Visit: Payer: Self-pay

## 2017-07-09 ENCOUNTER — Ambulatory Visit (INDEPENDENT_AMBULATORY_CARE_PROVIDER_SITE_OTHER): Payer: BC Managed Care – PPO | Admitting: Neurology

## 2017-07-09 VITALS — BP 190/98 | HR 71 | Ht 62.0 in | Wt 289.0 lb

## 2017-07-09 DIAGNOSIS — R200 Anesthesia of skin: Secondary | ICD-10-CM

## 2017-07-09 DIAGNOSIS — I639 Cerebral infarction, unspecified: Secondary | ICD-10-CM | POA: Diagnosis not present

## 2017-07-09 DIAGNOSIS — I6381 Other cerebral infarction due to occlusion or stenosis of small artery: Secondary | ICD-10-CM

## 2017-07-09 NOTE — Progress Notes (Signed)
NEUROLOGY CONSULTATION NOTE  Lindsay Gomez MRN: [id number] DOB: Apr 17, 1970  Referring provider: Caesar Chestnut, NP Primary care provider: Caesar Chestnut, NP  Reason for consult:  Stroke, right-sided tingling  Thank you for your kind referral of INETTE Gomez for consultation of the above symptoms. Although her history is well known to you, please allow me to reiterate it for the purpose of our medical record. Records and images were personally reviewed where available.  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 She presented to the ER on 03/03/17 with sudden onset right-sided tingling and numbness in the face, arm, and leg. No focal weakness. Her SBP was 180, glucose was 490. NIHSS of 1. 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 Echo showed an EF of 82-95%, grade 1 diastolic dysfunction, left atrium moderately dilated, no PFO. Carotid dopplers no significant stenosis. Stroke etiology likely due to small vessel disease. She was discharged home on full dose aspirin, with continued lipid, glucose, and BP control recommended. She has had difficulties managing her glucose levels, last HbA1c in 02/2017 was 12.7, LDL 126.   Since the stroke, she continues to have right hand numbness and tingling with occasional sharp pains in her fingertips. She also has numbness in her right cheek, "like novocaine." Her leg is better, but sometimes the ankle bothers her and goes to sleep. She has occasional right lateral thigh pain when sitting for prolonged periods. She denies any weakness, it hurts to write or type sometimes. She works as a Oncologist, symptoms  have mostly not affected work except for the pain when writing/typing. She has occasional headaches with pressure in her left eye going across her forehead occurring every few weeks. No nausea/vomiting, photo/phonophobia. She goes to sleep and does not take medication. Headaches started right before the stroke occurred. She denies any dizziness. Sometimes pain aggravates her right cheek. She occasionally finds herself coughing when swallowing since the stroke. Vision is occasionally blurred. She has not been taking her aspirin regularly. She was recently on vacation and missed her BP medications. She takes her insulin regularly but sugar levels still range between 300-400. She recalls taking gabapentin for her nerves, it caused extreme anxiety and depression. There is a family history of stroke in her great aunt, her father had a heart attack.   PAST MEDICAL HISTORY: Past Medical History:  Diagnosis Date  . Allergy    rhinitis  . Anemia   . Anxiety   . Diabetes mellitus without complication ([place])   . Enlarged heart   . Hypertension   . Morbid obesity ([place])   . Shortness of breath dyspnea    with low iron    PAST SURGICAL HISTORY: Past Surgical History:  Procedure Laterality Date  . ABDOMINAL HYSTERECTOMY N/A 01/29/2015   Procedure: TOTAL ABDOMINAL HYSTERECTOMY ;  Surgeon: Ena Dawley, MD;  Location: [facility];  Service: Gynecology;  Laterality: N/A;  . BILATERAL SALPINGECTOMY Bilateral 01/29/2015   Procedure: BILATERAL SALPINGECTOMY;  Surgeon: Ena Dawley, MD;  Location: [facility];  Service: Gynecology;  Laterality: Bilateral;  . CESAREAN SECTION    . KNEE SURGERY  1992   ? arthroscopic/ right    MEDICATIONS: Current Outpatient Medications on File Prior to  Visit  Medication Sig Dispense Refill  . aspirin EC 325 MG EC tablet Take 1 tablet (325 mg total) by mouth daily. 30 tablet 0  . atorvastatin (LIPITOR) 40 MG tablet Take 1 tablet (40 mg total) by mouth daily. 30 tablet 3  .  Azilsartan-Chlorthalidone (EDARBYCLOR) 40-25 MG TABS Take 1 tablet by mouth daily. 90 tablet 0  . Blood Glucose Monitoring Suppl (ONE TOUCH ULTRA MINI) w/Device KIT Use device to test blood sugars 1-4 times daily as instructed. 1 each 0  . CINNAMON PO Take by mouth.    . Continuous Blood Gluc Sensor MISC 1 each by Does not apply route as directed. Use as directed every 10 days. May dispense FreeStyle [employer] or similar. Patient has reader and will need prescription for 3-day sensors 3 each 1  . glucose blood test strip Use as instructed 100 each 12  . Insulin Glargine (BASAGLAR KWIKPEN) 100 UNIT/ML SOPN Inject 0.56 mLs (56 Units total) into the skin daily at 10 pm. 5 pen 0  . Insulin Pen Needle (BD PEN NEEDLE NANO U/F) 32G X 4 MM MISC USE ONCE DAILY AS DIRECTED. 100 each 0  . Lancets MISC Use one lancet per test to check blood sugar 1-4 times daily. Dx code:E11.9 100 each 5  . Multiple Vitamin (MULTIVITAMIN) capsule Take 1 capsule by mouth daily.    . potassium chloride SA (K-DUR,KLOR-CON) 20 MEQ tablet Take 1 tablet (20 mEq total) by mouth 2 (two) times daily. 60 tablet 0   No current facility-administered medications on file prior to visit.     ALLERGIES: Allergies  Allergen Reactions  . Lisinopril Other (See Comments)    Cough     FAMILY HISTORY: Family History  Problem Relation Age of Onset  . Hypertension Mother   . Diabetes Mother   . Coronary artery disease Father   . Heart failure Father   . Heart attack Father   . Diabetes Father   . Hypertension Other   . Hyperlipidemia Other     SOCIAL HISTORY: Social History   Socioeconomic History  . Marital status: Single    Spouse name: Not on file  . Number of children: Not on file  . Years of education: Not on file  . Highest education level: Not on file  Occupational History  . Not on file  Social Needs  . Financial resource strain: Not on file  . Food insecurity:    Worry: Not on file    Inability: Not  on file  . Transportation needs:    Medical: Not on file    Non-medical: Not on file  Tobacco Use  . Smoking status: Never Smoker  . Smokeless tobacco: Never Used  Substance and Sexual Activity  . Alcohol use: No  . Drug use: No  . Sexual activity: Never    Birth control/protection: None  Lifestyle  . Physical activity:    Days per week: Not on file    Minutes per session: Not on file  . Stress: Not on file  Relationships  . Social connections:    Talks on phone: Not on file    Gets together: Not on file    Attends religious service: Not on file    Active member of club or organization: Not on file    Attends meetings of clubs or organizations: Not on file    Relationship status: Not on file  . Intimate partner violence:    Fear of current or ex partner:  Not on file    Emotionally abused: Not on file    Physically abused: Not on file    Forced sexual activity: Not on file  Other Topics Concern  . Not on file  Social History Narrative   Pt lives alone in 2 story home   Has 2 children   Masters degree   Works as an Tourist information centre manager     REVIEW OF SYSTEMS: Constitutional: No fevers, chills, or sweats, no generalized fatigue, change in appetite Eyes: No visual changes, double vision, eye pain Ear, nose and throat: No hearing loss, ear pain, nasal congestion, sore throat Cardiovascular: No chest pain, palpitations Respiratory:  No shortness of breath at rest or with exertion, wheezes GastrointestinaI: No nausea, vomiting, diarrhea, abdominal pain, fecal incontinence Genitourinary:  No dysuria, urinary retention or frequency Musculoskeletal:  No neck pain, back pain Integumentary: No rash, pruritus, skin lesions Neurological: as above Psychiatric: No depression, insomnia, anxiety Endocrine: No palpitations, fatigue, diaphoresis, mood swings, change in appetite, change in weight, increased thirst Hematologic/Lymphatic:  No anemia, purpura, petechiae. Allergic/Immunologic: no  itchy/runny eyes, nasal congestion, recent allergic reactions, rashes  PHYSICAL EXAM: Vitals:   07/09/17 1353  BP: (!) 190/98  Pulse: 71  SpO2: 94%   General: No acute distress Head:  Normocephalic/atraumatic Eyes: Fundoscopic exam shows bilateral sharp discs, no vessel changes, exudates, or hemorrhages Neck: supple, no paraspinal tenderness, full range of motion Back: No paraspinal tenderness Heart: regular rate and rhythm Lungs: Clear to auscultation bilaterally. Vascular: No carotid bruits. Skin/Extremities: No rash, no edema Neurological Exam: Mental status: alert and oriented to person, place, and time, no dysarthria or aphasia, Fund of knowledge is appropriate.  Recent and remote memory are intact.  Attention and concentration are normal.    Able to name objects and repeat phrases. Cranial nerves: CN I: not tested CN II: pupils equal, round and reactive to light, visual fields intact, fundi unremarkable. CN III, IV, VI:  full range of motion, no nystagmus, no ptosis CN V: facial sensation intact CN VII: upper and lower face symmetric CN VIII: hearing intact to finger rub CN IX, X: gag intact, uvula midline CN XI: sternocleidomastoid and trapezius muscles intact CN XII: tongue midline Bulk & Tone: normal, no fasciculations. Motor: 5/5 throughout with no pronator drift. Sensation: intact to light touch, cold, pin, vibration and joint position sense.  No extinction to double simultaneous stimulation.  Romberg test negative Deep Tendon Reflexes: +2 throughout, no ankle clonus Plantar responses: downgoing bilaterally Cerebellar: no incoordination on finger to nose, heel to shin. No dysdiadochokinesia Gait: narrow-based and steady, able to tandem walk adequately. Tremor: none  IMPRESSION: This is a 47 year old right-handed woman with a history of hypertension, diabetes, left thalamic stroke in December 2018, presenting for right-sided paresthesias. MRI findings were reviewed  and discussed with the patient, I explained to her how her symptoms are due to the stroke, and at this point may potentially be more long-term (>40month post-stroke). We discussed symptomatic treatment with membrane stabilizing agents, she is agreeable to trying low dose Lyrica and uptitrate as tolerated, side effects were discussed. She had side effects to gabapentin in the past. We also discussed likely etiology of subcortical stroke (small vessel disease), and the importance of compliance with daily aspirin, BP, glucose, and lipid control. Her BP today is 190/98, she has missed medications due to being on vacation. She knows to go to the ER immediately for any sudden change in symptoms. She will follow-up in 6 months and  knows to call for any changes.   Thank you for allowing me to participate in the care of this patient. Please do not hesitate to call for any questions or concerns.   Ellouise Newer, M.D.  CC: Brien Few, NP

## 2017-07-09 NOTE — Patient Instructions (Signed)
1. Try Lyrica 50mg  every night. If no side effects and still having a lot of pain, we can increase dose further. If you would like to continue Lyrica, please let us know so we can send refills  2. Take the aspirin regularly, continue control of blood pressure, sugar, and cholesterol 3. For any sudden change in symptoms, go to ER immediately 4. Follow-up in 6-7 months, call for any changes.

## 2017-07-12 DIAGNOSIS — I6381 Other cerebral infarction due to occlusion or stenosis of small artery: Secondary | ICD-10-CM | POA: Insufficient documentation

## 2017-07-12 DIAGNOSIS — I639 Cerebral infarction, unspecified: Secondary | ICD-10-CM | POA: Insufficient documentation

## 2017-08-16 ENCOUNTER — Encounter: Payer: Self-pay | Admitting: Gastroenterology

## 2017-08-16 ENCOUNTER — Ambulatory Visit (INDEPENDENT_AMBULATORY_CARE_PROVIDER_SITE_OTHER): Payer: BC Managed Care – PPO | Admitting: Gastroenterology

## 2017-08-16 VITALS — BP 134/82 | HR 76 | Ht 62.0 in | Wt 296.2 lb

## 2017-08-16 DIAGNOSIS — R7989 Other specified abnormal findings of blood chemistry: Secondary | ICD-10-CM

## 2017-08-16 DIAGNOSIS — R0989 Other specified symptoms and signs involving the circulatory and respiratory systems: Secondary | ICD-10-CM | POA: Diagnosis not present

## 2017-08-16 DIAGNOSIS — R945 Abnormal results of liver function studies: Secondary | ICD-10-CM | POA: Diagnosis not present

## 2017-08-16 DIAGNOSIS — K760 Fatty (change of) liver, not elsewhere classified: Secondary | ICD-10-CM

## 2017-08-16 NOTE — Progress Notes (Signed)
History of Present Illness: This is a 47 year old female referred by Marrian Salvage,* FNP for the evaluation of elevated LFTs: AST=99, ALT=128.  She is accompanied by her boyfriend.  She has had difficulties maintaining adequate control of her diabetes.  Hbg A1c=12.7 in 02/2017. Per Epic review her LFTs normal were 09/2012 and AST/ALT have been elevated since 07/2015.  There is no personal or family history of liver disease or jaundice.  She relates a few episodes of choking since her stroke last year.  Some episodes were associated with meals and other episodes were not.  Denies weight loss, abdominal pain, constipation, diarrhea, change in stool caliber, melena, hematochezia, nausea, vomiting, reflux symptoms, chest pain.   Abd US IMPRESSION: 1. Inhomogeneous and echogenic liver parenchyma consistent with diffuse fatty infiltration. No focal abnormality. 2. No gallstones.    Allergies  Allergen Reactions  . Lisinopril Other (See Comments)    Cough    Outpatient Medications Prior to Visit  Medication Sig Dispense Refill  . Azilsartan-Chlorthalidone (EDARBYCLOR) 40-25 MG TABS Take 1 tablet by mouth daily. 90 tablet 0  . Blood Glucose Monitoring Suppl (ONE TOUCH ULTRA MINI) w/Device KIT Use device to test blood sugars 1-4 times daily as instructed. 1 each 0  . CINNAMON PO Take by mouth.    . Continuous Blood Gluc Sensor MISC 1 each by Does not apply route as directed. Use as directed every 10 days. May dispense FreeStyle [employer] or similar. Patient has reader and will need prescription for 3-day sensors 3 each 1  . glucose blood test strip Use as instructed 100 each 12  . Insulin Glargine (BASAGLAR KWIKPEN) 100 UNIT/ML SOPN Inject 0.56 mLs (56 Units total) into the skin daily at 10 pm. (Patient taking differently: Inject 66 Units into the skin daily at 10 pm. ) 5 pen 0  . Insulin Pen Needle (BD PEN NEEDLE NANO U/F) 32G X 4 MM MISC USE ONCE DAILY AS DIRECTED. 100 each 0    . Lancets MISC Use one lancet per test to check blood sugar 1-4 times daily. Dx code:E11.9 100 each 5  . aspirin EC 325 MG EC tablet Take 1 tablet (325 mg total) by mouth daily. 30 tablet 0  . atorvastatin (LIPITOR) 40 MG tablet Take 1 tablet (40 mg total) by mouth daily. 30 tablet 3  . Multiple Vitamin (MULTIVITAMIN) capsule Take 1 capsule by mouth daily.    . potassium chloride SA (K-DUR,KLOR-CON) 20 MEQ tablet Take 1 tablet (20 mEq total) by mouth 2 (two) times daily. 60 tablet 0   No facility-administered medications prior to visit.    Past Medical History:  Diagnosis Date  . Allergy    rhinitis  . Anemia   . Anxiety   . Diabetes mellitus without complication ([place])   . Enlarged heart   . Hypertension   . Morbid obesity ([place])   . Shortness of breath dyspnea    with low iron   Past Surgical History:  Procedure Laterality Date  . ABDOMINAL HYSTERECTOMY N/A 01/29/2015   Procedure: TOTAL ABDOMINAL HYSTERECTOMY ;  Surgeon: Ena Dawley, MD;  Location: [facility];  Service: Gynecology;  Laterality: N/A;  . BILATERAL SALPINGECTOMY Bilateral 01/29/2015   Procedure: BILATERAL SALPINGECTOMY;  Surgeon: Ena Dawley, MD;  Location: [facility];  Service: Gynecology;  Laterality: Bilateral;  . CESAREAN SECTION    . KNEE SURGERY  1992   ? arthroscopic/ right   Social History   Socioeconomic History  .  Marital status: Single    Spouse name: Not on file  . Number of children: Not on file  . Years of education: Not on file  . Highest education level: Not on file  Occupational History  . Not on file  Social Needs  . Financial resource strain: Not on file  . Food insecurity:    Worry: Not on file    Inability: Not on file  . Transportation needs:    Medical: Not on file    Non-medical: Not on file  Tobacco Use  . Smoking status: Never Smoker  . Smokeless tobacco: Never Used  Substance and Sexual Activity  . Alcohol use: No  . Drug use: No  . Sexual activity: Never    Birth  control/protection: None  Lifestyle  . Physical activity:    Days per week: Not on file    Minutes per session: Not on file  . Stress: Not on file  Relationships  . Social connections:    Talks on phone: Not on file    Gets together: Not on file    Attends religious service: Not on file    Active member of club or organization: Not on file    Attends meetings of clubs or organizations: Not on file    Relationship status: Not on file  Other Topics Concern  . Not on file  Social History Narrative   Pt lives alone in 2 story home   Has 2 children   Masters degree   Works as an Tourist information centre manager    Family History  Problem Relation Age of Onset  . Hypertension Mother   . Diabetes Mother   . Coronary artery disease Father   . Heart failure Father   . Heart attack Father   . Diabetes Father   . Hypertension Other   . Hyperlipidemia Other   . Cancer Paternal Grandmother        unknown type?  . Esophageal cancer Neg Hx   . Colon cancer Neg Hx   . Pancreatic cancer Neg Hx   . Stomach cancer Neg Hx          Review of Systems: Pertinent positive and negative review of systems were noted in the above HPI section. All other review of systems were otherwise negative.    Physical Exam: General: Well developed, well nourished, obese, no acute distress Head: Normocephalic and atraumatic Eyes:  sclerae anicteric, EOMI Ears: Normal auditory acuity Mouth: No deformity or lesions Neck: Supple, no masses or thyromegaly Lungs: Clear throughout to auscultation Heart: Regular rate and rhythm; no murmurs, rubs or bruits Abdomen: Soft, non tender and non distended. No masses, hepatosplenomegaly or hernias noted. Normal Bowel sounds Rectal: Not done Musculoskeletal: Symmetrical with no gross deformities  Skin: No lesions on visible extremities Pulses:  Normal pulses noted Extremities: No clubbing, cyanosis, edema or deformities noted Neurological: Alert oriented x 4, grossly nonfocal Cervical  Nodes:  No significant cervical adenopathy Inguinal Nodes: No significant inguinal adenopathy Psychological:  Alert and cooperative. Normal mood and affect  Assessment and Recommendations:  1. Elevated transaminases likely due to hepatic steatosis.  Rule out other causes.  Send standard hepatic serologies.  Recommended a long-term carb modified, fat modified weight loss program supervised by her PCP.  Recommended close follow-up with her endocrinologist to optimize diabetes control.  Dietitian referral.  Further plans pending review of hepatic serologies.  2.  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  If symptoms worsen further evaluate with MBSS and barium esophagram.  3.  Morbid obesity. BMI=54.  4.  Diabetes mellitus, not adequately controlled.  Dietitian referral.   cc: Marrian Salvage, [address] [place], [place] [address]

## 2017-08-16 NOTE — Patient Instructions (Addendum)
Your provider has requested that you go to the basement level for lab work before leaving today. Press "B" on the elevator. The lab is located at the first door on the left as you exit the elevator.  Diabetes and nutrition services will contact you with an appointment date and time.   Normal BMI (Body Mass Index- based on height and weight) is between 19 and 25. Your BMI today is Body mass index is 54.18 kg/m. Marland Kitchen Please consider follow up  regarding your BMI with your Primary Care Provider.  Thank you for choosing me and [facility].  Pricilla Riffle. Dagoberto Ligas., MD., [facility] Regal

## 2017-08-17 ENCOUNTER — Other Ambulatory Visit (INDEPENDENT_AMBULATORY_CARE_PROVIDER_SITE_OTHER): Payer: BC Managed Care – PPO

## 2017-08-17 DIAGNOSIS — R7989 Other specified abnormal findings of blood chemistry: Secondary | ICD-10-CM

## 2017-08-17 DIAGNOSIS — R945 Abnormal results of liver function studies: Secondary | ICD-10-CM | POA: Diagnosis not present

## 2017-08-17 LAB — IBC PANEL
Iron: 81 ug/dL (ref 42–145)
Saturation Ratios: 21.8 % (ref 20.0–50.0)
Transferrin: 265 mg/dL (ref 212.0–360.0)

## 2017-08-17 LAB — PROTIME-INR
INR: 1 ratio (ref 0.8–1.0)
Prothrombin Time: 12.1 s (ref 9.6–13.1)

## 2017-08-17 LAB — FERRITIN: Ferritin: 88.6 ng/mL (ref 10.0–291.0)

## 2017-08-17 LAB — IGA: IgA: 281 mg/dL (ref 68–378)

## 2017-08-20 LAB — HEPATITIS C ANTIBODY
Hepatitis C Ab: NONREACTIVE
SIGNAL TO CUT-OFF: 0.29 (ref <1.00)

## 2017-08-20 LAB — ALPHA-1-ANTITRYPSIN: A-1 Antitrypsin, Ser: 151 mg/dL (ref 83–199)

## 2017-08-20 LAB — ANTI-SMOOTH MUSCLE ANTIBODY, IGG: Actin (Smooth Muscle) Antibody (IGG): <20 U (ref <20)

## 2017-08-20 LAB — TISSUE TRANSGLUTAMINASE, IGA: (tTG) Ab, IgA: 1 U/mL

## 2017-08-20 LAB — HEPATITIS B SURFACE ANTIGEN: Hepatitis B Surface Ag: NONREACTIVE

## 2017-08-20 LAB — ANA: Anti Nuclear Antibody(ANA): NEGATIVE

## 2017-08-20 LAB — HEPATITIS B SURFACE ANTIBODY,QUALITATIVE: Hep B S Ab: NONREACTIVE

## 2017-08-20 LAB — CERULOPLASMIN: Ceruloplasmin: 39 mg/dL (ref 18–53)

## 2017-08-20 LAB — MITOCHONDRIAL ANTIBODIES: Mitochondrial M2 Ab, IgG: <=20 U

## 2017-09-03 ENCOUNTER — Other Ambulatory Visit: Payer: Self-pay | Admitting: Family

## 2017-09-03 DIAGNOSIS — E1169 Type 2 diabetes mellitus with other specified complication: Secondary | ICD-10-CM

## 2017-09-03 DIAGNOSIS — Z794 Long term (current) use of insulin: Principal | ICD-10-CM

## 2017-09-03 NOTE — Telephone Encounter (Signed)
Patient said she will be out by tomorrow and is feeling terrible. She just wanted me to see if a rush could be put on this  [facility]

## 2017-09-06 ENCOUNTER — Other Ambulatory Visit: Payer: Self-pay

## 2017-09-06 ENCOUNTER — Encounter (HOSPITAL_COMMUNITY): Payer: Self-pay | Admitting: Obstetrics and Gynecology

## 2017-09-06 ENCOUNTER — Emergency Department (HOSPITAL_COMMUNITY): Payer: BC Managed Care – PPO

## 2017-09-06 DIAGNOSIS — E1165 Type 2 diabetes mellitus with hyperglycemia: Secondary | ICD-10-CM | POA: Insufficient documentation

## 2017-09-06 DIAGNOSIS — I10 Essential (primary) hypertension: Secondary | ICD-10-CM | POA: Insufficient documentation

## 2017-09-06 DIAGNOSIS — Z7982 Long term (current) use of aspirin: Secondary | ICD-10-CM | POA: Insufficient documentation

## 2017-09-06 DIAGNOSIS — Z79899 Other long term (current) drug therapy: Secondary | ICD-10-CM | POA: Diagnosis not present

## 2017-09-06 DIAGNOSIS — E876 Hypokalemia: Secondary | ICD-10-CM | POA: Insufficient documentation

## 2017-09-06 DIAGNOSIS — R531 Weakness: Secondary | ICD-10-CM | POA: Diagnosis present

## 2017-09-06 LAB — CBC
HCT: 48.2 % — ABNORMAL HIGH (ref 36.0–46.0)
Hemoglobin: 16.3 g/dL — ABNORMAL HIGH (ref 12.0–15.0)
MCH: 28.8 pg (ref 26.0–34.0)
MCHC: 33.8 g/dL (ref 30.0–36.0)
MCV: 85.2 fL (ref 78.0–100.0)
Platelets: 306 10*3/uL (ref 150–400)
RBC: 5.66 MIL/uL — ABNORMAL HIGH (ref 3.87–5.11)
RDW: 13.1 % (ref 11.5–15.5)
WBC: 8.9 10*3/uL (ref 4.0–10.5)

## 2017-09-06 LAB — I-STAT TROPONIN, ED: Troponin i, poc: 0 ng/mL (ref 0.00–0.08)

## 2017-09-06 LAB — BASIC METABOLIC PANEL
Anion gap: 12 (ref 5–15)
BUN: 14 mg/dL (ref 6–20)
CO2: 30 mmol/L (ref 22–32)
Calcium: 9.2 mg/dL (ref 8.9–10.3)
Chloride: 92 mmol/L — ABNORMAL LOW (ref 101–111)
Creatinine, Ser: 0.76 mg/dL (ref 0.44–1.00)
GFR calc Af Amer: >60 mL/min (ref >60)
GFR calc non Af Amer: >60 mL/min (ref >60)
Glucose, Bld: 315 mg/dL — ABNORMAL HIGH (ref 65–99)
Potassium: 2.8 mmol/L — ABNORMAL LOW (ref 3.5–5.1)
Sodium: 134 mmol/L — ABNORMAL LOW (ref 135–145)

## 2017-09-06 LAB — CBG MONITORING, ED: Glucose-Capillary: 277 mg/dL — ABNORMAL HIGH (ref 65–99)

## 2017-09-06 NOTE — ED Triage Notes (Signed)
Per Pt: Pt reports pain in her back and her right hip, hyperglycemia up to 409, chest pain, and throat pain.  Pt also reports she has been taking her insulin and BP medicine as prescribed.

## 2017-09-07 ENCOUNTER — Ambulatory Visit: Payer: Self-pay | Admitting: Registered"

## 2017-09-07 ENCOUNTER — Emergency Department (HOSPITAL_COMMUNITY)
Admission: EM | Admit: 2017-09-07 | Discharge: 2017-09-07 | Disposition: A | Discharge status: Home / Self Care | Payer: BC Managed Care – PPO | Attending: Emergency Medicine | Admitting: Emergency Medicine

## 2017-09-07 DIAGNOSIS — R739 Hyperglycemia, unspecified: Secondary | ICD-10-CM

## 2017-09-07 DIAGNOSIS — E876 Hypokalemia: Secondary | ICD-10-CM

## 2017-09-07 LAB — URINALYSIS, ROUTINE W REFLEX MICROSCOPIC
Bilirubin Urine: NEGATIVE
Glucose, UA: >=500 mg/dL — AB
Hgb urine dipstick: NEGATIVE
Ketones, ur: NEGATIVE mg/dL
Leukocytes, UA: NEGATIVE
Nitrite: NEGATIVE
Protein, ur: NEGATIVE mg/dL
Specific Gravity, Urine: 1.022 (ref 1.005–1.030)
pH: 5 (ref 5.0–8.0)

## 2017-09-07 LAB — CBG MONITORING, ED: Glucose-Capillary: 252 mg/dL — ABNORMAL HIGH (ref 70–99)

## 2017-09-07 MED ORDER — POTASSIUM CHLORIDE CRYS ER 20 MEQ PO TBCR: 20.0000 meq | EXTENDED_RELEASE_TABLET | Freq: Two times a day (BID) | ORAL | 0 refills | Status: DC

## 2017-09-07 MED ORDER — SODIUM CHLORIDE 0.9 % IV BOLUS
1000.0000 mL | Freq: Once | INTRAVENOUS | Status: AC
  Administered 2017-09-07: 1000 mL via INTRAVENOUS

## 2017-09-07 MED ORDER — POTASSIUM CHLORIDE CRYS ER 20 MEQ PO TBCR
40.0000 meq | EXTENDED_RELEASE_TABLET | Freq: Once | ORAL | Status: AC
  Administered 2017-09-07: 40 meq via ORAL
  Filled 2017-09-07: qty 2

## 2017-09-07 MED ORDER — POTASSIUM CHLORIDE 10 MEQ/100ML IV SOLN
10.0000 meq | Freq: Once | INTRAVENOUS | Status: AC
  Administered 2017-09-07: 10 meq via INTRAVENOUS
  Filled 2017-09-07: qty 100

## 2017-09-07 NOTE — ED Provider Notes (Signed)
[facility] DEPT Provider Note   CSN: [id number] Arrival date & time: 09/06/17  2218     History   Chief Complaint Chief Complaint  Patient presents with  . Hyperglycemia  . Fatigue  . Chest Pain    HPI Lindsay Gomez is a 47 y.o. female.  HPI Lindsay Gomez is a 47 y.o. female presents emergency department complaining of generalized weakness.  She states that she has not been feeling well over the last few weeks, states she has no energy.  She states her blood sugars have been running high even though she is taking her medications.  She states that she recently had her primary care doctor change her medication for her diabetes.  She states that prior to arrival here her sugar was greater than 500.  She denies any fever or chills.  No urinary symptoms.  No URI symptoms.  No abdominal pain.  She states she just feels bad.  Past Medical History:  Diagnosis Date  . Allergy    rhinitis  . Anemia   . Anxiety   . Diabetes mellitus without complication ([place])   . Enlarged heart   . Hypertension   . Morbid obesity ([place])   . Shortness of breath dyspnea    with low iron    Patient Active Problem List   Diagnosis Date Noted  . Hepatic steatosis 08/16/2017  . 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 Cerebrovascular accident (CVA) ([place]) 03/14/2017  . Right hip pain 03/14/2017  . Numbness 03/03/2017  . Hypocalcemia 03/03/2017  . Cerebral thrombosis with cerebral infarction 03/03/2017  . Major depressive disorder, single episode, moderate ([place]) 10/28/2015  . Microcytic hypochromic anemia 07/29/2015  . Type 2 diabetes mellitus ([place]) 06/24/2015  . Depression 06/24/2015  . Polyuria 06/17/2015  . Obstructive sleep apnea 06/17/2015  . Anxiety and depression 04/24/2015  . Pre-op evaluation 01/09/2015  . Hyperlipidemia 01/06/2013  . Right lumbar radiculopathy 10/26/2012  . ANA positive 09/14/2012  . LVH (left ventricular hypertrophy) 12/08/2011  .  Menorrhagia 09/24/2011  . Routine general medical examination at a health care facility 09/24/2011  . Carpal tunnel syndrome of right wrist 09/18/2010  . Hypokalemia 09/18/2010  . Morbid obesity ([place]) 11/29/2008  . Iron deficiency anemia 11/28/2008  . Essential hypertension 11/28/2008  . ALLERGIC RHINITIS 11/28/2008    Past Surgical History:  Procedure Laterality Date  . ABDOMINAL HYSTERECTOMY N/A 01/29/2015   Procedure: TOTAL ABDOMINAL HYSTERECTOMY ;  Surgeon: Ena Dawley, MD;  Location: [facility];  Service: Gynecology;  Laterality: N/A;  . BILATERAL SALPINGECTOMY Bilateral 01/29/2015   Procedure: BILATERAL SALPINGECTOMY;  Surgeon: Ena Dawley, MD;  Location: [facility];  Service: Gynecology;  Laterality: Bilateral;  . CESAREAN SECTION    . KNEE SURGERY  1992   ? arthroscopic/ right     OB History    Gravida  3   Para  1   Term      Preterm      AB  1   Living  2     SAB  1   TAB      Ectopic      Multiple      Live Births               Home Medications    Prior to Admission medications   Medication Sig Start Date End Date Taking? Authorizing Provider  aspirin (ASPIRIN 81) 81 MG EC tablet Take 81 mg by mouth daily. Swallow whole.   Yes  [provider]  Azilsartan-Chlorthalidone (EDARBYCLOR) 40-25 MG TABS Take 1 tablet by mouth daily. 04/26/17  Yes Lance Sell, NP  glipiZIDE (GLUCOTROL XL) 5 MG 24 hr tablet Take 5 mg by mouth daily with breakfast.  08/26/17  Yes [provider]  ibuprofen (ADVIL,MOTRIN) 800 MG tablet Take 800 mg by mouth every 8 (eight) hours as needed for moderate pain.   Yes [provider]  Insulin Glargine (BASAGLAR KWIKPEN) 100 UNIT/ML SOPN INJECT 0.56ML (56 UNITS) INTO SKIN DAILY AT 10PM. Patient taking differently: INJECT 0.66ML (66 UNITS) INTO SKIN DAILY AT 10PM. 09/03/17  Yes Shambley, Delphia Grates, NP  TRULICITY 1.5 KT/6.2BW SOPN Inject 1.5 mg as directed every Friday.  08/26/17  Yes [provider]  Blood Glucose Monitoring Suppl (ONE TOUCH ULTRA MINI) w/Device KIT Use device to test blood sugars 1-4 times daily as instructed. 06/24/15   Golden Circle, FNP  Continuous Blood Gluc Sensor MISC 1 each by Does not apply route as directed. Use as directed every 10 days. May dispense FreeStyle [employer] or similar. Patient has reader and will need prescription for 3-day sensors 03/31/17   Lance Sell, NP  glucose blood test strip Use as instructed 06/24/15   Golden Circle, FNP  Insulin Pen Needle (BD PEN NEEDLE NANO U/F) 32G X 4 MM MISC USE ONCE DAILY AS DIRECTED. 01/18/17   Nche, Charlene Brooke, NP  Lancets MISC Use one lancet per test to check blood sugar 1-4 times daily. Dx code:E11.9 12/27/15   Golden Circle, FNP    Family History Family History  Problem Relation Age of Onset  . Hypertension Mother   . Diabetes Mother   . Coronary artery disease Father   . Heart failure Father   . Heart attack Father   . Diabetes Father   . Hypertension Other   . Hyperlipidemia Other   . Cancer Paternal Grandmother        unknown type?  . Esophageal cancer Neg Hx   . Colon cancer Neg Hx   . Pancreatic cancer Neg Hx   . Stomach cancer Neg Hx     Social History Social History   Tobacco Use  . Smoking status: Never Smoker  . Smokeless tobacco: Never Used  Substance Use Topics  . Alcohol use: No  . Drug use: No     Allergies   Lisinopril   Review of Systems Review of Systems  Constitutional: Positive for fatigue. Negative for chills and fever.  Respiratory: Negative for cough, chest tightness and shortness of breath.   Cardiovascular: Negative for chest pain, palpitations and leg swelling.  Gastrointestinal: Negative for abdominal pain, diarrhea, nausea and vomiting.  Genitourinary: Negative for dysuria, flank pain, pelvic pain, vaginal bleeding, vaginal discharge and vaginal pain.  Musculoskeletal: Negative for arthralgias, myalgias, neck  pain and neck stiffness.  Skin: Negative for rash.  Neurological: Positive for weakness. Negative for dizziness and headaches.  All other systems reviewed and are negative.    Physical Exam Updated Vital Signs BP (!) 127/95   Pulse 95   Temp 99.3 F (37.4 C) (Oral)   Resp (!) 25   Ht 5' 2" (1.575 m)   Wt 132.5 kg (292 lb)   LMP 12/18/2014 (Exact Date)   SpO2 92%   BMI 53.41 kg/m   Physical Exam  Constitutional: She is oriented to person, place, and time. She appears well-developed and well-nourished. No distress.  HENT:  Head: Normocephalic.  Mouth/Throat: Oropharynx is  clear and moist.  Eyes: Conjunctivae are normal.  Neck: Normal range of motion. Neck supple.  Cardiovascular: Normal rate, regular rhythm and normal heart sounds.  Pulmonary/Chest: Effort normal and breath sounds normal. No respiratory distress. She has no wheezes. She has no rales.  Abdominal: Soft. Bowel sounds are normal. She exhibits no distension. There is no tenderness. There is no rebound.  Musculoskeletal: She exhibits no edema.  Neurological: She is alert and oriented to person, place, and time.  Skin: Skin is warm and dry.  Psychiatric: She has a normal mood and affect. Her behavior is normal.  Nursing note and vitals reviewed.    ED Treatments / Results  Labs (all labs ordered are listed, but only abnormal results are displayed) Labs Reviewed  BASIC METABOLIC PANEL - Abnormal; Notable for the following components:      Result Value   Sodium 134 (*)    Potassium 2.8 (*)    Chloride 92 (*)    Glucose, Bld 315 (*)    All other components within normal limits  CBC - Abnormal; Notable for the following components:   RBC 5.66 (*)    Hemoglobin 16.3 (*)    HCT 48.2 (*)    All other components within normal limits  URINALYSIS, ROUTINE W REFLEX MICROSCOPIC - Abnormal; Notable for the following components:   Glucose, UA >=500 (*)    Bacteria, UA RARE (*)    All other components within normal  limits  CBG MONITORING, ED - Abnormal; Notable for the following components:   Glucose-Capillary 277 (*)    All other components within normal limits  CBG MONITORING, ED - Abnormal; Notable for the following components:   Glucose-Capillary 28 (*)    All other components within normal limits  CBG MONITORING, ED - Abnormal; Notable for the following components:   Glucose-Capillary 252 (*)    All other components within normal limits  I-STAT TROPONIN, ED    EKG EKG Interpretation  Date/Time:  Monday September 06 2017 22:41:21 EDT Ventricular Rate:  108 PR Interval:    QRS Duration: 105 QT Interval:  365 QTC Calculation: 490 R Axis:   58 Text Interpretation:  Sinus tachycardia Probable left ventricular hypertrophy Borderline prolonged QT interval Rate is faster Confirmed by Shanon Rosser [phone]) on 09/07/2017 6:29:12 AM   Radiology Dg Chest 2 View  Result Date: 09/06/2017 CLINICAL DATA:  Initial evaluation for acute chest pain. EXAM: CHEST - 2 VIEW COMPARISON:  Prior radiograph from 11/06/2016. FINDINGS: Mild cardiomegaly, stable from previous. Mediastinal silhouette normal. Lungs normally inflated. Mild chronic bronchitic changes at the right lung base, stable. No focal infiltrates. No pulmonary edema or pleural effusion. No pneumothorax. No acute osseous abnormality. IMPRESSION: 1. Mild scattered chronic bronchitic changes, stable. No other active cardiopulmonary disease. 2. Stable cardiomegaly without pulmonary edema. Electronically Signed   By: Jeannine Boga M.D.   On: 09/06/2017 23:01    Procedures Procedures (including critical care time)  Medications Ordered in ED Medications  sodium chloride 0.9 % bolus 1,000 mL (has no administration in time range)  potassium chloride 10 mEq in 100 mL IVPB (has no administration in time range)  potassium chloride SA (K-DUR,KLOR-CON) CR tablet 40 mEq (40 mEq Oral Given 09/07/17 0313)     Initial Impression / Assessment and Plan / ED  Course  I have reviewed the triage vital signs and the nursing notes.  Pertinent labs & imaging results that were available during my care of the patient were reviewed by me  and considered in my medical decision making (see chart for details).     Patient in the emergency department with hyperglycemia, glucose at home greater than 500, and generalized weakness for several weeks.  She is nontoxic-appearing on exam, no findings.  Vital signs are normal.  Will check labs and urinalysis.   Potassium is 2.7, will give her 10 mEq IV and 40 mEq p.o.  Will give IV fluids for hyperglycemia, glucose here is 315.  Will monitor.  Urinalysis pending.  6:22 AM Recheck CBG is 252.  Patient received potassium and IV fluids.  Urine analysis unremarkable other than spilling glucose.  Discussed dietary changes and starting to exercise to lower her blood sugar.  Will discharge home with prescription for potassium.  We will have her follow-up closely with her family doctor for recheck of her glucose and potassium.  Return precautions discussed.  At this time I do not see any evidence of infectious process.  She is stable for discharge  Vitals:   09/06/17 2230 09/07/17 0300  BP: (!) 151/94 (!) 127/95  Pulse: (!) 113 95  Resp: 17 (!) 25  Temp: 99.3 F (37.4 C)   TempSrc: Oral   SpO2: 96% 92%  Weight: 132.5 kg (292 lb)   Height: 5' 2" (1.575 m)      Final Clinical Impressions(s) / ED Diagnoses   Final diagnoses:  Hyperglycemia  Hypokalemia    ED Discharge Orders    None       Jeannett Senior, PA-C 09/07/17 0640    Molpus, Jenny Reichmann, MD 09/07/17 [phone]

## 2017-09-07 NOTE — Discharge Instructions (Addendum)
Take potassium as prescribed until all gone.  Increase your potassium food intake.  Eat healthier, exercise, this will help you lower your blood sugar.  Follow-up with family doctor for recheck of your glucose and for your potassium.  Return if your symptoms are worsening

## 2017-09-07 NOTE — ED Notes (Signed)
Pt stated earlier she needs to use the restroom and give a urine sample before hooked up to the IV fluids, this nurse offered to help her to the restroom before starting her fluids but states she "no longer feels like it" Pt aware that sample is needed.

## 2017-09-07 NOTE — ED Notes (Signed)
RN and PA notified of 28 CBG results- rechecked with a new result of 252.

## 2017-09-09 LAB — CBG MONITORING, ED: Glucose-Capillary: 28 mg/dL — CL (ref 70–99)

## 2017-09-10 ENCOUNTER — Ambulatory Visit: Payer: Self-pay | Admitting: Nurse Practitioner

## 2017-09-10 NOTE — Telephone Encounter (Signed)
Pt c/o 2 week h/o nausea. Pt had diarrhea but that sx resolved. Pt vomited today x 1 non bilious, no blood emesis. Pt states she has cramping after eating which subsides after eating.  Pt stated that she was see Tuesday night at the ED for rehydration and potassium. Pt stated she urinated this am.  Offered Saturday clinic to pt but she declined because she is out of town. Called Sam at PCP office to schedule pt and pt has appt for 2:30 pm Monday.  Reason for Disposition . [1] MILD or MODERATE vomiting AND [2] present > 48 hours (2 days) (Exception: mild vomiting with associated diarrhea)  Answer Assessment - Initial Assessment Questions 1. VOMITING SEVERITY: "How many times have you vomited in the past 24 hours?"     - MILD:  1 - 2 times/day    - MODERATE: 3 - 5 times/day, decreased oral intake without significant weight loss or symptoms of dehydration    - SEVERE: 6 or more times/day, vomits everything or nearly everything, with significant weight loss, symptoms of dehydration     Mild vomited x 1  2. ONSET: "When did the vomiting begin?"      today 3. FLUIDS: "What fluids or food have you vomited up today?" "Have you been able to keep any fluids down?"     Vomited grits-has not eaten or or drank since vomiting 4. ABDOMINAL PAIN: "Are your having any abdominal pain?" If yes : "How bad is it and what does it feel like?" (e.g., crampy, dull, intermittent, constant)      Yes RLQ and RUQ-mild but worsens after eating-crampy nagging pain constant after eating then will go away, then comes and goes. Cramping easing up after having BM 5. DIARRHEA: "Is there any diarrhea?" If so, ask: "How many times today?"      Yes- none 6. CONTACTS: "Is there anyone else in the family with the same symptoms?"      no 7. CAUSE: "What do you think is causing your vomiting?"     Pt is not sure 8. HYDRATION STATUS: "Any signs of dehydration?" (e.g., dry mouth [not only dry lips], too weak to stand) "When did you last  urinate?"     No- last urinated this am  9. OTHER SYMPTOMS: "Do you have any other symptoms?" (e.g., fever, headache, vertigo, vomiting blood or coffee grounds, recent head injury)     Slight headache earlier today 10. PREGNANCY: "Is there any chance you are pregnant?" "When was your last menstrual period?"       S/p hysterectomy  Protocols used: [facility]

## 2017-09-13 ENCOUNTER — Other Ambulatory Visit (INDEPENDENT_AMBULATORY_CARE_PROVIDER_SITE_OTHER): Payer: BC Managed Care – PPO

## 2017-09-13 ENCOUNTER — Ambulatory Visit: Payer: BC Managed Care – PPO | Admitting: Nurse Practitioner

## 2017-09-13 ENCOUNTER — Encounter: Payer: Self-pay | Admitting: Nurse Practitioner

## 2017-09-13 VITALS — BP 120/74 | HR 95 | Temp 99.0°F | Resp 16 | Ht 62.0 in | Wt 287.0 lb

## 2017-09-13 DIAGNOSIS — R112 Nausea with vomiting, unspecified: Secondary | ICD-10-CM

## 2017-09-13 DIAGNOSIS — E876 Hypokalemia: Secondary | ICD-10-CM

## 2017-09-13 DIAGNOSIS — R109 Unspecified abdominal pain: Secondary | ICD-10-CM | POA: Diagnosis not present

## 2017-09-13 DIAGNOSIS — E1159 Type 2 diabetes mellitus with other circulatory complications: Secondary | ICD-10-CM

## 2017-09-13 LAB — CBC
HCT: 45 % (ref 36.0–46.0)
Hemoglobin: 15 g/dL (ref 12.0–15.0)
MCHC: 33.4 g/dL (ref 30.0–36.0)
MCV: 85.1 fl (ref 78.0–100.0)
Platelets: 296 10*3/uL (ref 150.0–400.0)
RBC: 5.29 Mil/uL — ABNORMAL HIGH (ref 3.87–5.11)
RDW: 13.3 % (ref 11.5–15.5)
WBC: 7.1 10*3/uL (ref 4.0–10.5)

## 2017-09-13 LAB — COMPREHENSIVE METABOLIC PANEL
ALT: 54 U/L — ABNORMAL HIGH (ref 0–35)
AST: 35 U/L (ref 0–37)
Albumin: 3.8 g/dL (ref 3.5–5.2)
Alkaline Phosphatase: 86 U/L (ref 39–117)
BUN: 8 mg/dL (ref 6–23)
CO2: 34 mEq/L — ABNORMAL HIGH (ref 19–32)
Calcium: 9 mg/dL (ref 8.4–10.5)
Chloride: 95 mEq/L — ABNORMAL LOW (ref 96–112)
Creatinine, Ser: 0.75 mg/dL (ref 0.40–1.20)
GFR: 107.45 mL/min (ref >60.00)
Glucose, Bld: 321 mg/dL — ABNORMAL HIGH (ref 70–99)
Potassium: 3.4 mEq/L — ABNORMAL LOW (ref 3.5–5.1)
Sodium: 136 mEq/L (ref 135–145)
Total Bilirubin: 0.6 mg/dL (ref 0.2–1.2)
Total Protein: 7.1 g/dL (ref 6.0–8.3)

## 2017-09-13 LAB — LIPASE: Lipase: 31 U/L (ref 11.0–59.0)

## 2017-09-13 LAB — AMYLASE: Amylase: 36 U/L (ref 27–131)

## 2017-09-13 NOTE — Patient Instructions (Signed)
Please head downstairs for lab work. If any of your test results are critically abnormal, you will be contacted right away. Otherwise, I will contact you within a week about your test results and any recommendations for abnormalities.  I have placed an order for CT scan of your abdomen and a referral to another endocrinologist to help with your diabetes. Our office will call you to schedule this appointment. You should hear from our office in 7-10 days.

## 2017-09-13 NOTE — Progress Notes (Signed)
Name: Lindsay Gomez   MRN: [id number]    DOB: 11/07/1970   Date:09/13/2017       Progress Note  Subjective  Chief Complaint  Chief Complaint  Patient presents with  . Nausea    states that she has been having issues with going to the bathroom or feeling nauseous and wanting to vomit right after eating has gotten bad over the last 3 weeks weeks    HPI  Lindsay Gomez is here today for evaluation of an acute complaint of nausea and vomiting. She reports upper abdominal pain and discomfort, which has been occurring for years, but more severe and frequent recently, and after eating a meal Friday she felt so sick she vomited. She has continued to have upper abdominal pain and nausea after oral intake since Friday. She reports acid regurgitation, variation between constipation and soft, loose stools. She has noticed occasional scant blood on tissue paper intermittently after episodes of straining and constipation. She denies fevers, chills, weakness, syncope, chest pain, shortness of breath, dysuria, hematuria,  urinary frequency, vaginal bleeding. She was recently evaluated by GI for elevated transaminases with no further follow up given. She also recently established with a new endocrinologist for further management of uncontrolled diabetes but felt like her glucose readings were trending up with recommended changes and stopped going to her follow up appointments. In fact, she went to the ED on 6/25 for hyperglycemia but no changes were made to her diabetes medications and aside from hyperglycemia and hypokalemia her ED workup was normal.  She says she completed the potassium course prescribed by ED on 6/25 and she is currently taking glipizide 5 daily, basaglar 56 units daily, and trulicity 1.5 mg weekly for her diabetes. She has also been trying to go to the gym to exercise and tells me her recent glucose readings at home have been down from the 300-400s to the 200s over past week  Patient Active  Problem List   Diagnosis Date Noted  . Hepatic steatosis 08/16/2017  . 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 Cerebrovascular accident (CVA) ([place]) 03/14/2017  . Right hip pain 03/14/2017  . Numbness 03/03/2017  . Hypocalcemia 03/03/2017  . Cerebral thrombosis with cerebral infarction 03/03/2017  . Major depressive disorder, single episode, moderate ([place]) 10/28/2015  . Microcytic hypochromic anemia 07/29/2015  . Type 2 diabetes mellitus ([place]) 06/24/2015  . Depression 06/24/2015  . Polyuria 06/17/2015  . Obstructive sleep apnea 06/17/2015  . Anxiety and depression 04/24/2015  . Pre-op evaluation 01/09/2015  . Hyperlipidemia 01/06/2013  . Right lumbar radiculopathy 10/26/2012  . ANA positive 09/14/2012  . LVH (left ventricular hypertrophy) 12/08/2011  . Menorrhagia 09/24/2011  . Routine general medical examination at a health care facility 09/24/2011  . Carpal tunnel syndrome of right wrist 09/18/2010  . Hypokalemia 09/18/2010  . Morbid obesity ([place]) 11/29/2008  . Iron deficiency anemia 11/28/2008  . Essential hypertension 11/28/2008  . ALLERGIC RHINITIS 11/28/2008    Social History   Tobacco Use  . Smoking status: Never Smoker  . Smokeless tobacco: Never Used  Substance Use Topics  . Alcohol use: No     Current Outpatient Medications:  .  aspirin (ASPIRIN 81) 81 MG EC tablet, Take 81 mg by mouth daily. Swallow whole., Disp: , Rfl:  .  Azilsartan-Chlorthalidone (EDARBYCLOR) 40-25 MG TABS, Take 1 tablet by mouth daily., Disp: 90 tablet, Rfl: 0 .  Blood Glucose Monitoring Suppl (ONE TOUCH ULTRA MINI) w/Device KIT, Use device to test blood sugars 1-4 times  daily as instructed., Disp: 1 each, Rfl: 0 .  Continuous Blood Gluc Sensor MISC, 1 each by Does not apply route as directed. Use as directed every 10 days. May dispense FreeStyle [employer] or similar. Patient has reader and will need prescription for 3-day sensors, Disp: 3 each, Rfl: 1 .  glipiZIDE (GLUCOTROL XL) 5 MG  24 hr tablet, Take 5 mg by mouth daily with breakfast. , Disp: , Rfl:  .  glucose blood test strip, Use as instructed, Disp: 100 each, Rfl: 12 .  ibuprofen (ADVIL,MOTRIN) 800 MG tablet, Take 800 mg by mouth every 8 (eight) hours as needed for moderate pain., Disp: , Rfl:  .  Insulin Glargine (BASAGLAR KWIKPEN) 100 UNIT/ML SOPN, INJECT 0.56ML (56 UNITS) INTO SKIN DAILY AT 10PM. (Patient taking differently: INJECT 0.66ML (66 UNITS) INTO SKIN DAILY AT 10PM.), Disp: 15 mL, Rfl: 0 .  Insulin Pen Needle (BD PEN NEEDLE NANO U/F) 32G X 4 MM MISC, USE ONCE DAILY AS DIRECTED., Disp: 100 each, Rfl: 0 .  Lancets MISC, Use one lancet per test to check blood sugar 1-4 times daily. Dx code:E11.9, Disp: 100 each, Rfl: 5 .  potassium chloride SA (K-DUR,KLOR-CON) 20 MEQ tablet, Take 1 tablet (20 mEq total) by mouth 2 (two) times daily., Disp: 10 tablet, Rfl: 0 .  TRULICITY 1.5 VV/6.1QA SOPN, Inject 1.5 mg as directed every Friday. , Disp: , Rfl:   Allergies  Allergen Reactions  . Lisinopril Other (See Comments)    Cough     ROS  No other specific complaints in a complete review of systems (except as listed in HPI above).  Objective  Vitals:   09/13/17 1415  BP: 120/74  Pulse: 95  Resp: 16  Temp: 99 F (37.2 C)  TempSrc: Oral  SpO2: 98%  Weight: 287 lb (130.2 kg)  Height: _0  (1.575 m)    Body mass index is 52.49 kg/m.  Nursing Note and Vital Signs reviewed.  Physical Exam  Constitutional: Patient appears well-developed and well-nourished.  No distress.  HEENT: head atraumatic, normocephalic, pupils equal and reactive to light, EOM's intact,  neck supple, oropharynx pink and moist without exudate Cardiovascular: Normal rate, regular rhythm, S1/S2 present.  Distal pulses intact Pulmonary/Chest: Effort normal and breath sounds clear. No respiratory distress or retractions. Abdominal: Soft, rotund,. Bowel sounds are normal, no distension. There is no tenderness. no masses. There is no CVA  tenderness. Neurological: She is alert and oriented to person, place, and time. No cranial nerve deficit. Coordination, balance, strength, speech and gait are normal.  Skin: Skin is warm and dry. No rash noted. No erythema.  Psychiatric: Patient has a normal mood and affect. behavior is normal. Judgment and thought content normal.   Assessment & Plan F/U TBD pending lab and test results  Nausea and vomiting, intractability of vomiting not specified, unspecified vomiting type Abdominal pain, unspecified abdominal location Update labs and CT scan F/U with further recommendations pending lab results Could consider HIDA scan or referral back to GI for further evaluation and management pending test results today -Red flags and when to present for emergency care or RTC including fever >101.1F, chest pain, shortness of breath, new/worsening/un-resolving symptoms, reviewed with patient during visit - CBC; Future - Comprehensive metabolic panel; Future - Lipase; Future - Amylase; Future - CT Abdomen Pelvis W Contrast; Future

## 2017-09-13 NOTE — Assessment & Plan Note (Signed)
Update labs F/U with further recommendations pending lab results - Comprehensive metabolic panel; Future  

## 2017-09-13 NOTE — Assessment & Plan Note (Signed)
Due to history of uncontrolled diabetes with complications including stroke, it is imperative for her to continue routine follow up with endocrinology for management of diabetes, which we discussed today. We also discussed that her uncontrolled diabetes could be contributing to her GI upset. She is agreeable to seeing a new endocrinologist, referral has been placed - Ambulatory referral to Endocrinology

## 2017-09-17 ENCOUNTER — Other Ambulatory Visit: Payer: Self-pay | Admitting: Nurse Practitioner

## 2017-09-17 DIAGNOSIS — E876 Hypokalemia: Secondary | ICD-10-CM

## 2017-09-17 MED ORDER — POTASSIUM CHLORIDE CRYS ER 20 MEQ PO TBCR: 20.0000 meq | EXTENDED_RELEASE_TABLET | Freq: Every day | ORAL | 1 refills | Status: DC

## 2017-09-20 ENCOUNTER — Telehealth: Payer: Self-pay | Admitting: Nurse Practitioner

## 2017-09-20 NOTE — Telephone Encounter (Signed)
Copied from [place] [phone]. Topic: Quick Communication - Lab Results >> Sep 20, 2017  9:46 AM Lorrin [place], CMA wrote: Called patient to inform them of 09/13/2017 lab results. When patient returns call, triage nurse may disclose results. >> Sep 20, 2017 10:09 AM Antonieta Iba C wrote: Pt is returning call for lab results. Pt ways that it is okay to lvm, or send her a message on mychart.

## 2017-09-20 NOTE — Telephone Encounter (Signed)
Pt given lab results and documented in result note.  

## 2017-09-21 ENCOUNTER — Other Ambulatory Visit: Payer: Self-pay

## 2017-09-22 ENCOUNTER — Telehealth: Payer: Self-pay | Admitting: *Deleted

## 2017-09-22 NOTE — Telephone Encounter (Signed)
PA for Edarbyclor 40-25 mg is approved x 1 year (09/23/2018). Pt's pharmacy informed.

## 2017-09-23 ENCOUNTER — Ambulatory Visit (INDEPENDENT_AMBULATORY_CARE_PROVIDER_SITE_OTHER)
Admission: RE | Admit: 2017-09-23 | Discharge: 2017-09-23 | Disposition: A | Discharge status: Home / Self Care | Payer: BC Managed Care – PPO | Source: Ambulatory Visit | Attending: Nurse Practitioner | Admitting: Nurse Practitioner

## 2017-09-23 DIAGNOSIS — R109 Unspecified abdominal pain: Secondary | ICD-10-CM | POA: Diagnosis not present

## 2017-09-23 DIAGNOSIS — R112 Nausea with vomiting, unspecified: Secondary | ICD-10-CM

## 2017-09-23 MED ORDER — IOPAMIDOL (ISOVUE-300) INJECTION 61%
100.0000 mL | Freq: Once | INTRAVENOUS | Status: AC | PRN
  Administered 2017-09-23: 100 mL via INTRAVENOUS

## 2017-09-27 ENCOUNTER — Other Ambulatory Visit: Payer: Self-pay | Admitting: Nurse Practitioner

## 2017-09-27 DIAGNOSIS — R112 Nausea with vomiting, unspecified: Secondary | ICD-10-CM

## 2017-10-06 ENCOUNTER — Encounter (HOSPITAL_COMMUNITY)
Admission: RE | Admit: 2017-10-06 | Discharge: 2017-10-06 | Disposition: A | Discharge status: Home / Self Care | Payer: BC Managed Care – PPO | Source: Ambulatory Visit | Attending: Nurse Practitioner | Admitting: Nurse Practitioner

## 2017-10-06 DIAGNOSIS — R112 Nausea with vomiting, unspecified: Secondary | ICD-10-CM | POA: Diagnosis present

## 2017-10-06 MED ORDER — TECHNETIUM TC 99M MEBROFENIN IV KIT
5.0000 | PACK | Freq: Once | INTRAVENOUS | Status: AC | PRN
  Administered 2017-10-06: 5.1 via INTRAVENOUS

## 2017-10-07 ENCOUNTER — Encounter: Payer: Self-pay | Admitting: Nurse Practitioner

## 2017-10-07 ENCOUNTER — Ambulatory Visit: Payer: BC Managed Care – PPO | Admitting: Nurse Practitioner

## 2017-10-07 ENCOUNTER — Ambulatory Visit (INDEPENDENT_AMBULATORY_CARE_PROVIDER_SITE_OTHER): Payer: BC Managed Care – PPO | Admitting: Nurse Practitioner

## 2017-10-07 VITALS — BP 110/72 | HR 93 | Temp 99.7°F | Resp 16 | Ht 62.0 in | Wt 287.0 lb

## 2017-10-07 DIAGNOSIS — Z794 Long term (current) use of insulin: Secondary | ICD-10-CM | POA: Diagnosis not present

## 2017-10-07 DIAGNOSIS — E1169 Type 2 diabetes mellitus with other specified complication: Secondary | ICD-10-CM | POA: Diagnosis not present

## 2017-10-07 DIAGNOSIS — R109 Unspecified abdominal pain: Secondary | ICD-10-CM

## 2017-10-07 MED ORDER — BASAGLAR KWIKPEN 100 UNIT/ML ~~LOC~~ SOPN: PEN_INJECTOR | SUBCUTANEOUS | 0 refills | Status: DC

## 2017-10-07 MED ORDER — TRULICITY 1.5 MG/0.5ML ~~LOC~~ SOAJ: 1.5000 mg | SUBCUTANEOUS | 1 refills | Status: DC

## 2017-10-07 NOTE — Progress Notes (Signed)
Name: Lindsay Gomez   MRN: [id number]    DOB: 09-06-1970   Date:10/07/2017       Progress Note  Subjective  Chief Complaint  Chief Complaint  Patient presents with  . Follow-up    CT scan results and gallbladder test    HPI  Lindsay Gomez is here today to discuss recent CT and HIDA scan results, ordered for abdominal pain and nausea and vomiting discussed at her last visit on 7/1. On 7/1 she told me that she had been experiencing upper abdominal pain and discomfort for years, worse after po intake, and prior to her appointment on 7/1 had experienced several episodes of nausea and vomiting. A CT scan was ordered which showed no acute abnormalities other than constipation, and then a HIDA scan was done yesterday which was within normal limits Lindsay Gomez says she has not experienced any additional nausea or vomiting since 7/1 but continues to feel very tired and experience upper and periumbilical abdominal pain after eating or taking medications, and especially after eating bread or greasy meals. She report occasional heartburn She denies fevers, chills, weakness, syncope, chest pain, shortness of breath, dysuria, hematuria,  urinary frequency She has still not heard from endocrinology for a new patient appointment and has not been checking her blood sugars at home  Patient Active Problem List   Diagnosis Date Noted  . Hepatic steatosis 08/16/2017  . 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 Cerebrovascular accident (CVA) ([place]) 03/14/2017  . Hypocalcemia 03/03/2017  . Cerebral thrombosis with cerebral infarction 03/03/2017  . Major depressive disorder, single episode, moderate ([place]) 10/28/2015  . Microcytic hypochromic anemia 07/29/2015  . Type 2 diabetes mellitus ([place]) 06/24/2015  . Depression 06/24/2015  . Obstructive sleep apnea 06/17/2015  . Anxiety and depression 04/24/2015  . Pre-op evaluation 01/09/2015  . Hyperlipidemia 01/06/2013  . Right lumbar radiculopathy 10/26/2012  . ANA  positive 09/14/2012  . LVH (left ventricular hypertrophy) 12/08/2011  . Menorrhagia 09/24/2011  . Routine general medical examination at a health care facility 09/24/2011  . Carpal tunnel syndrome of right wrist 09/18/2010  . Hypokalemia 09/18/2010  . Morbid obesity ([place]) 11/29/2008  . Iron deficiency anemia 11/28/2008  . Essential hypertension 11/28/2008  . ALLERGIC RHINITIS 11/28/2008    Past Surgical History:  Procedure Laterality Date  . ABDOMINAL HYSTERECTOMY N/A 01/29/2015   Procedure: TOTAL ABDOMINAL HYSTERECTOMY ;  Surgeon: Ena Dawley, MD;  Location: [facility];  Service: Gynecology;  Laterality: N/A;  . BILATERAL SALPINGECTOMY Bilateral 01/29/2015   Procedure: BILATERAL SALPINGECTOMY;  Surgeon: Ena Dawley, MD;  Location: [facility];  Service: Gynecology;  Laterality: Bilateral;  . CESAREAN SECTION    . KNEE SURGERY  1992   ? arthroscopic/ right    Family History  Problem Relation Age of Onset  . Hypertension Mother   . Diabetes Mother   . Coronary artery disease Father   . Heart failure Father   . Heart attack Father   . Diabetes Father   . Hypertension Other   . Hyperlipidemia Other   . Cancer Paternal Grandmother        unknown type?  . Esophageal cancer Neg Hx   . Colon cancer Neg Hx   . Pancreatic cancer Neg Hx   . Stomach cancer Neg Hx     Social History   Socioeconomic History  . Marital status: Single    Spouse name: Not on file  . Number of children: Not on file  . Years of education: Not on file  .  Highest education level: Not on file  Occupational History  . Not on file  Social Needs  . Financial resource strain: Not on file  . Food insecurity:    Worry: Not on file    Inability: Not on file  . Transportation needs:    Medical: Not on file    Non-medical: Not on file  Tobacco Use  . Smoking status: Never Smoker  . Smokeless tobacco: Never Used  Substance and Sexual Activity  . Alcohol use: No  . Drug use: No  . Sexual activity: Not  Currently    Birth control/protection: None  Lifestyle  . Physical activity:    Days per week: Not on file    Minutes per session: Not on file  . Stress: Not on file  Relationships  . Social connections:    Talks on phone: Not on file    Gets together: Not on file    Attends religious service: Not on file    Active member of club or organization: Not on file    Attends meetings of clubs or organizations: Not on file    Relationship status: Not on file  . Intimate partner violence:    Fear of current or ex partner: Not on file    Emotionally abused: Not on file    Physically abused: Not on file    Forced sexual activity: Not on file  Other Topics Concern  . Not on file  Social History Narrative   Pt lives alone in 2 story home   Has 2 children   Masters degree   Works as an Tourist information centre manager      Current Outpatient Medications:  .  aspirin (ASPIRIN 81) 81 MG EC tablet, Take 81 mg by mouth daily. Swallow whole., Disp: , Rfl:  .  Azilsartan-Chlorthalidone (EDARBYCLOR) 40-25 MG TABS, Take 1 tablet by mouth daily., Disp: 90 tablet, Rfl: 0 .  Blood Glucose Monitoring Suppl (ONE TOUCH ULTRA MINI) w/Device KIT, Use device to test blood sugars 1-4 times daily as instructed., Disp: 1 each, Rfl: 0 .  Continuous Blood Gluc Sensor MISC, 1 each by Does not apply route as directed. Use as directed every 10 days. May dispense FreeStyle [employer] or similar. Patient has reader and will need prescription for 3-day sensors, Disp: 3 each, Rfl: 1 .  glipiZIDE (GLUCOTROL XL) 5 MG 24 hr tablet, Take 5 mg by mouth daily with breakfast. , Disp: , Rfl:  .  glucose blood test strip, Use as instructed, Disp: 100 each, Rfl: 12 .  ibuprofen (ADVIL,MOTRIN) 800 MG tablet, Take 800 mg by mouth every 8 (eight) hours as needed for moderate pain., Disp: , Rfl:  .  Insulin Glargine (BASAGLAR KWIKPEN) 100 UNIT/ML SOPN, INJECT 0.56ML (56 UNITS) INTO SKIN DAILY AT 10PM., Disp: 15 mL, Rfl: 0 .  Insulin Pen Needle (BD  PEN NEEDLE NANO U/F) 32G X 4 MM MISC, USE ONCE DAILY AS DIRECTED., Disp: 100 each, Rfl: 0 .  Lancets MISC, Use one lancet per test to check blood sugar 1-4 times daily. Dx code:E11.9, Disp: 100 each, Rfl: 5 .  potassium chloride SA (K-DUR,KLOR-CON) 20 MEQ tablet, Take 1 tablet (20 mEq total) by mouth daily., Disp: 30 tablet, Rfl: 1 .  [START ON 0/60/0459] TRULICITY 1.5 XH/7.4FS SOPN, Inject 1.5 mg as directed every Friday., Disp: 18 mL, Rfl: 1  Allergies  Allergen Reactions  . Lisinopril Other (See Comments)    Cough      ROS See HPI  Objective  Vitals:   10/07/17 1322  BP: 110/72  Pulse: 93  Resp: 16  Temp: 99.7 F (37.6 C)  TempSrc: Oral  SpO2: 98%  Weight: 287 lb (130.2 kg)  Height: 5' 2"  (1.575 m)    Body mass index is 52.49 kg/m.  Physical Exam Constitutional: Patient appears well-developed and well-nourished.  No distress.  HEENT: head atraumatic, normocephalic, pupils equal and reactive to light, EOM's intact,  neck supple, oropharynx pink and moist without exudate Cardiovascular: Normal rate, regular rhythm, S1/S2 present.  Distal pulses intact Pulmonary/Chest: Effort normal and breath sounds clear. No respiratory distress or retractions. Abdominal: Soft, rotund,. Bowel sounds are normal, no distension. There is no tenderness. no masses. There is no CVA tenderness. Neurological: She is alert and oriented to person, place, and time. No cranial nerve deficit. Coordination, balance, strength, speech and gait are normal.  Skin: Skin is warm and dry. No rash noted. No erythema.  Psychiatric: Patient has a normal mood and affect. behavior is normal. Judgment and thought content normal.   Assessment & Plan  Abdominal pain, unspecified abdominal location Recent CT Abd/pelvis and HIDA without any acute abnormalities We discussed referral to GI for further evaluation of abdominal pain and she was agreeable, but does request not to go back to [facility] so we will see if we  can get her into another GI office - Ambulatory referral to Gastroenterology

## 2017-10-07 NOTE — Patient Instructions (Addendum)
I have placed a referral to GI for further evaluation of your abdominal pain. Our office will begin processing this referral. Please follow up if you have not heard anything about this referral within 10 days.  Please follow up with endocrinology for your diabetes as we discussed, it is very important that we get your diabetes under control due to your history of stroke.  Please remember to stop by the lab the first week in august to have your potassium rechecked.

## 2017-10-07 NOTE — Assessment & Plan Note (Signed)
Again today we discussed the importance of  glucose control in the management of her chronic conditions and prevention of another stroke, I have checked with our referral staff and she has been referred to [facility] endocrinology, she was provided with number to call and schedule an appointment today. - Insulin Glargine (BASAGLAR KWIKPEN) 100 UNIT/ML SOPN; INJECT 0.56ML (56 UNITS) INTO SKIN DAILY AT 10PM.  Dispense: 15 mL; Refill: 0

## 2017-10-18 ENCOUNTER — Other Ambulatory Visit: Payer: Self-pay | Admitting: Nurse Practitioner

## 2017-10-18 DIAGNOSIS — Z794 Long term (current) use of insulin: Principal | ICD-10-CM

## 2017-10-18 DIAGNOSIS — E1169 Type 2 diabetes mellitus with other specified complication: Secondary | ICD-10-CM

## 2017-11-14 ENCOUNTER — Other Ambulatory Visit: Payer: Self-pay | Admitting: Nurse Practitioner

## 2017-11-14 DIAGNOSIS — Z794 Long term (current) use of insulin: Principal | ICD-10-CM

## 2017-11-14 DIAGNOSIS — E1169 Type 2 diabetes mellitus with other specified complication: Secondary | ICD-10-CM

## 2018-01-03 ENCOUNTER — Ambulatory Visit: Payer: BC Managed Care – PPO | Admitting: Family Medicine

## 2018-01-03 VITALS — BP 138/88 | HR 97 | Temp 98.8°F | Ht 62.0 in | Wt 292.4 lb

## 2018-01-03 DIAGNOSIS — J029 Acute pharyngitis, unspecified: Secondary | ICD-10-CM | POA: Diagnosis not present

## 2018-01-03 NOTE — Progress Notes (Signed)
Lindsay Gomez - 48 y.o. female MRN [id number]  Date of birth: Aug 20, 1970  SUBJECTIVE:  Including CC & ROS.  Chief Complaint  Patient presents with  . Sore Throat    started last week, went away and then returned. Scratchy and painful, minimal congestion    Lindsay Gomez is a 47 y.o. female that is presenting with cough, sore throat and malaise.  Her symptoms been present for less than a week.  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  She denies any fevers.  She has had a cough that is nonproductive.  She feels like her symptoms are staying the same.  She has not tried any over-the-counter medications.     Review of Systems  Constitutional: Negative for fever.  HENT: Positive for sore throat. Negative for congestion.   Respiratory: Positive for cough.   Cardiovascular: Negative for chest pain.  Gastrointestinal: Negative for abdominal pain.  Musculoskeletal: Negative for back pain.    HISTORY: Past Medical, Surgical, Social, and Family History Reviewed & Updated per EMR.   Pertinent Historical Findings include:  Past Medical History:  Diagnosis Date  . Allergy    rhinitis  . Anemia   . Anxiety   . Diabetes mellitus without complication ([place])   . Enlarged heart   . Hypertension   . Morbid obesity ([place])   . Shortness of breath dyspnea    with low iron    Past Surgical History:  Procedure Laterality Date  . ABDOMINAL HYSTERECTOMY N/A 01/29/2015   Procedure: TOTAL ABDOMINAL HYSTERECTOMY ;  Surgeon: Ena Dawley, MD;  Location: [facility];  Service: Gynecology;  Laterality: N/A;  . BILATERAL SALPINGECTOMY Bilateral 01/29/2015   Procedure: BILATERAL SALPINGECTOMY;  Surgeon: Ena Dawley, MD;  Location: [facility];  Service: Gynecology;  Laterality: Bilateral;  . CESAREAN SECTION    . KNEE SURGERY  1992   ? arthroscopic/ right    Allergies  Allergen Reactions  . Lisinopril Other (See Comments)    Cough     Family  History  Problem Relation Age of Onset  . Hypertension Mother   . Diabetes Mother   . Coronary artery disease Father   . Heart failure Father   . Heart attack Father   . Diabetes Father   . Hypertension Other   . Hyperlipidemia Other   . Cancer Paternal Grandmother        unknown type?  . Esophageal cancer Neg Hx   . Colon cancer Neg Hx   . Pancreatic cancer Neg Hx   . Stomach cancer Neg Hx      Social History   Socioeconomic History  . Marital status: Single    Spouse name: Not on file  . Number of children: Not on file  . Years of education: Not on file  . Highest education level: Not on file  Occupational History  . Not on file  Social Needs  . Financial resource strain: Not on file  . Food insecurity:    Worry: Not on file    Inability: Not on file  . Transportation needs:    Medical: Not on file    Non-medical: Not on file  Tobacco Use  . Smoking status: Never Smoker  . Smokeless tobacco: Never Used  Substance and Sexual Activity  . Alcohol use: No  . Drug use: No  . Sexual activity: Not Currently    Birth control/protection: None  Lifestyle  . Physical activity:  Days per week: Not on file    Minutes per session: Not on file  . Stress: Not on file  Relationships  . Social connections:    Talks on phone: Not on file    Gets together: Not on file    Attends religious service: Not on file    Active member of club or organization: Not on file    Attends meetings of clubs or organizations: Not on file    Relationship status: Not on file  . Intimate partner violence:    Fear of current or ex partner: Not on file    Emotionally abused: Not on file    Physically abused: Not on file    Forced sexual activity: Not on file  Other Topics Concern  . Not on file  Social History Narrative   Pt lives alone in 2 story home   Has 2 children   Masters degree   Works as an Tourist information centre manager      PHYSICAL EXAM:  VS: BP 138/88 (BP Location: Left Arm, Patient  Position: Sitting, Cuff Size: Large)   Pulse 97   Temp 98.8 F (37.1 C) (Oral)   Ht 5\' 2"  (1.575 m)   Wt 292 lb 6.4 oz (132.6 kg)   LMP 12/18/2014 (Exact Date)   SpO2 93%   BMI 53.48 kg/m  Physical Exam Gen: NAD, alert, cooperative with exam, ENT: normal lips, normal nasal mucosa, tympanic membranes clear and intact bilaterally, normal oropharynx, no cervical lymphadenopathy, no tonsillar exudates  Eye: normal EOM, normal conjunctiva and lids CV:  no edema, +2 pedal pulses, regular rate and rhythm, S1-S2   Resp: no accessory muscle use, non-labored, clear to auscultation bilaterally, no crackles or wheezes Skin: no rashes, no areas of induration  Neuro: normal tone, normal sensation to touch Psych:  normal insight, alert and oriented MSK: Normal gait, normal strength        ASSESSMENT & PLAN:   Sore throat Symptoms are likely viral in nature.  No fevers. -Counseled supportive care. -Given indications to follow-up.

## 2018-01-03 NOTE — Patient Instructions (Signed)
Nice to meet you   Please try things such as zyrtec-D or allegra-D which is an antihistamine and decongestant.   Honey, lozenges, and Chloraseptic spray can help with a sore throat.   Please let us know I you develop a fever or your symptoms worsen.

## 2018-01-03 NOTE — Assessment & Plan Note (Signed)
Symptoms are likely viral in nature.  No fevers. -Counseled supportive care. -Given indications to follow-up.

## 2018-01-06 NOTE — Progress Notes (Addendum)
Name: Lindsay Gomez  MRN/ DOB: [id number], 1970/11/12   Age/ Sex: 47 y.o., female    PCP: Lance Sell, NP   Reason for Endocrinology Evaluation: Type 2 Diabetes Mellitus     Date of Initial Endocrinology Visit: 01/07/2018     PATIENT IDENTIFIER: Lindsay Gomez is a 47 y.o. female with a past medical history of HTN, Obesity,CVA, OSA not on treatment and T2DM . The patient presented for initial ndocrinology clinic visit on 01/07/2018 for consultative assistance with her diabetes management.    HPI: Lindsay Gomez    Diagnosed with DM 2-3 yrs Prior Medications tried: Metformin - GI side effects  Hypoglycemia episodes : 0          Hemoglobin A1c has ranged from 6.3% in 2014, peaking at 14.1% in 2018. Currently checking blood sugars 0 x / day.  Patient required assistance for hypoglycemia: 0 Patient has required hospitalization within the last 1 year from hyper or hypoglycemia: 2  In terms of diet, the patient is on a poor diet , drinks regular sweet tea  Patient admits to non-compliance with glipizide and Basaglar. She may take the basaglar once a months. She is consistent with Trulicity and noted weight loss initially but has been steady.  She has Hx of recurrent yeast infections ~ 1 yr ago  Mother- DM  Lives with boyfriend who has T1DM and on dialysis   HOME DIABETES REGIMEN: Basaglar 66 units  Trulicity 1.5 mg weekly  Glipizide 5 mg daily     Statin: No ACE-I/ARB: Yes Prior Diabetic Education: Yes 2019   METER DOWNLOAD SUMMARY: did not bring    DIABETIC COMPLICATIONS: Microvascular complications:   Neuropathy  Denies:  Retinopathy, nephropathy  Last eye exam: Completed 2019  Macrovascular complications:   CVA (74/0814)  Denies: CAD, PVD   PAST HISTORY: Past Medical History:  Past Medical History:  Diagnosis Date  . Allergy    rhinitis  . Anemia   . Anxiety   . Diabetes mellitus without complication ([facility])   . Enlarged heart   .  Hypertension   . Morbid obesity ([place])   . Shortness of breath dyspnea    with low iron   Past Surgical History:  Past Surgical History:  Procedure Laterality Date  . ABDOMINAL HYSTERECTOMY N/A 01/29/2015   Procedure: TOTAL ABDOMINAL HYSTERECTOMY ;  Surgeon: Ena Dawley, MD;  Location: [facility];  Service: Gynecology;  Laterality: N/A;  . BILATERAL SALPINGECTOMY Bilateral 01/29/2015   Procedure: BILATERAL SALPINGECTOMY;  Surgeon: Ena Dawley, MD;  Location: [facility];  Service: Gynecology;  Laterality: Bilateral;  . CESAREAN SECTION    . KNEE SURGERY  1992   ? arthroscopic/ right      Social History:  reports that she has never smoked. She has never used smokeless tobacco. She reports that she does not drink alcohol or use drugs. Family History:  Family History  Problem Relation Age of Onset  . Hypertension Mother   . Diabetes Mother   . Coronary artery disease Father   . Heart failure Father   . Heart attack Father   . Diabetes Father   . Hypertension Other   . Hyperlipidemia Other   . Cancer Paternal Grandmother        unknown type?  . Esophageal cancer Neg Hx   . Colon cancer Neg Hx   . Pancreatic cancer Neg Hx   . Stomach cancer Neg Hx      HOME MEDICATIONS: Allergies as of  01/07/2018      Reactions   Lisinopril Other (See Comments)   Cough       Medication List        Accurate as of 01/07/18 12:48 PM. Always use your most recent med list.          ASPIRIN 81 81 MG EC tablet Generic drug:  aspirin Take 81 mg by mouth daily. Swallow whole.   Azilsartan-Chlorthalidone 40-25 MG Tabs Take 1 tablet by mouth daily.   FREESTYLE LIBRE 14 DAY READER Devi 1 applicator by Does not apply route daily.   FREESTYLE LIBRE 14 DAY SENSOR Misc 2 applicators by Does not apply route daily.   glipiZIDE 5 MG 24 hr tablet Commonly known as:  GLUCOTROL XL Take 5 mg by mouth daily with breakfast.   glucose blood test strip Use as instructed   ibuprofen 800 MG  tablet Commonly known as:  ADVIL,MOTRIN Take 800 mg by mouth every 8 (eight) hours as needed for moderate pain.   insulin degludec 100 UNIT/ML Sopn FlexTouch Pen Commonly known as:  TRESIBA Inject 0.45 mLs (45 Units total) into the skin daily.   Insulin Pen Needle 32G X 4 MM Misc USE ONCE DAILY AS DIRECTED.   Lancets Misc Use one lancet per test to check blood sugar 1-4 times daily. Dx code:E11.9   ONE TOUCH ULTRA MINI w/Device Kit Use device to test blood sugars 1-4 times daily as instructed.   pioglitazone 30 MG tablet Commonly known as:  ACTOS Take 1 tablet (30 mg total) by mouth daily.   potassium chloride SA 20 MEQ tablet Commonly known as:  K-DUR,KLOR-CON Take 1 tablet (20 mEq total) by mouth daily.   TRULICITY 1.5 CB/7.6EG Sopn Generic drug:  Dulaglutide Inject 1.5 mg as directed every Friday.        ALLERGIES: Allergies  Allergen Reactions  . Lisinopril Other (See Comments)    Cough      REVIEW OF SYSTEMS: A comprehensive ROS was conducted with the patient and is negative except as per HPI and below:  Review of Systems  Constitutional: Positive for malaise/fatigue. Negative for weight loss.  HENT: Negative.   Eyes: Negative.   Respiratory: Negative.   Cardiovascular: Negative.   Gastrointestinal: Negative.   Genitourinary: Negative.   Musculoskeletal: Positive for joint pain and myalgias.  Skin: Negative.   Neurological: Positive for tingling. Negative for tremors.  Endo/Heme/Allergies: Negative for polydipsia.  Psychiatric/Behavioral: The patient is not nervous/anxious.       OBJECTIVE:   VITAL SIGNS: BP (!) 166/94 (BP Location: Left Arm, Patient Position: Sitting, Cuff Size: Large)   Pulse 88   Ht 5' 2"  (1.575 m)   Wt 132.3 kg   LMP 12/18/2014 (Exact Date)   SpO2 96%   BMI 53.33 kg/m    PHYSICAL EXAM:  General: Pt appears well and is in NAD  Hydration: Well-hydrated with moist mucous membranes and good skin turgor  HEENT: Head:  Unremarkable with good dentition. Oropharynx clear without exudate.  Eyes: External eye exam normal without stare, lid lag or exophthalmos.  EOM intact.  PERRL.  Neck: General: Supple without adenopathy or carotid bruits. Thyroid: Thyroid size normal.  No goiter or nodules appreciated. No thyroid bruit.  Lungs: Clear with good BS bilat with no rales, rhonchi, or wheezes  Heart: RRR with normal S1 and S2 and no gallops; no murmurs; no rub  Abdomen: Normoactive bowel sounds, soft, nontender, without masses or organomegaly palpable  Extremities:  Lower extremities -  No pretibial edema. No lesions.  Skin: Normal texture and temperature to palpation. No rash noted. No Acanthosis nigricans/skin tags. No lipohypertrophy.  Neuro: MS is good with appropriate affect, pt is alert and Ox3    DM foot exam: 01/07/2018 The skin of the feet is intact without sores or ulcerations. The pedal pulses are 2+ on right and 2+ on left. The sensation is decreased to a screening 5.07, 10 gram monofilament bilaterally    DATA REVIEWED:  Lab Results  Component Value Date   HGBA1C 11.6 (A) 01/07/2018   HGBA1C 12.7 (H) 03/03/2017   HGBA1C 12.4 07/30/2016   Lab Results  Component Value Date   LDLCALC 126 (H) 03/03/2017   CREATININE 0.75 09/13/2017   No results found for: [facility]  Lab Results  Component Value Date   CHOL 188 03/03/2017   HDL 42 03/03/2017   LDLCALC 126 (H) 03/03/2017   LDLDIRECT 140.4 09/24/2011   TRIG 99 03/03/2017   CHOLHDL 4.5 03/03/2017        ASSESSMENT / PLAN / RECOMMENDATIONS:   1) Type 2 Diabetes Mellitus, poorly controlled, With microvascular and macrovascular complications - Most recent A1c of 11.6 % %. Goal A1c < 7.0 %.    Plan: GENERAL:  Poorly controlled diabetes due to medication nonadherence and dietary indiscretion. I have discussed with the patient the pathophysiology of diabetes. We went over the natural progression of the disease. We talked about both  insulin resistance and insulin deficiency. We stressed the importance of lifestyle changes including diet and exercise. I explained the complications associated with diabetes including retinopathy, nephropathy, neuropathy as well as increased risk of cardiovascular disease. We went over the benefit seen with glycemic control.    I explained to the patient that diabetic patients are at higher than normal risk for amputations. The patient was informed that diabetes is the number one cause of non-traumatic amputations in [place].  Discussed eating 3 consistent meals a day avoiding snacking,  we also provided options for low carb snacks.  She was also advised that if she absolutely is going to eat fast food to try and remove the top   Piece of bread from her sandwich, to avoid fries and have a side salad instead, and drink either water or sugar free beverages.  She was also advised to exercise between 150 - 170 minutes/week, of moderate exercise (brisk walking)  She is not satisfied with the basaglar, and would like to try the [place].  Her friend uses it.  We discussed that Tyler Aas is a long-acting ~ 40 hrs , and she is at risk for hypoglycemia if on higher dose than needed.  She does not like the glipizide.  We discussed other options such as SGL T2 inhibitors, we discussed side effects such as urinary tract infection and increased risk of yeast infection.  We also discussed pioglitazone and its effect in lowering insulin resistance, we also discussed side effects of weight gain and swelling with pioglitazone.  After discussing the above the patient decided to try pioglitazone, we discussed starting at 15 mg but the patient would like to start at 30 mg daily.  Patient encouraged to call us with any side effects with pioglitazone such as swelling or weight gain.  She would like to finish leftover basaglar prior to starting on the Tresiba.  Patient is intolerant to metformin.  She is interested in   in retrying the freestyle [place]- prescription will be sent.  She was advised to wipe it before  breakfast and before bedtime.  MEDICATIONS:  Stop basaglar.  Stop glipizide  Start Tresiba 40 units daily  Start pioglitazone 30 mg daily  Continue Trulicity 1.5 mg weekly.  EDUCATION / INSTRUCTIONS:  BG monitoring instructions: Patient is instructed to check her blood sugars 2 times a day, fasting and at bedtime..  Call [facility] if: BG persistently < 70 or > 300. . I reviewed the Rule of 15 for the treatment of hypoglycemia in detail with the patient. Literature supplied.   2) Diabetic complications:   Eye:She does not have known diabetic retinopathy. Last eye exam was   Neuro/ Feet: Does have known diabetic peripheral neuropathy.  Renal: Patient does not have known baseline CKD. She is on on an ACEI/ARB at present.   3) Lipids: Patient is not on a statin.  Patient will benefit from statin given her age over 48, history of CVA and diabetes.  We discussed that tight glycemic control will reduce her risk of microvascular complication but would not reduce her risk of macrovascular complications.  Her only chance of reducing her macrovascular complications at this point is to start on a statin.  We will revisit again on next visit.   4) Hypertension: She is not at goal of < 140/90 mmHg- Did not take her meds today. Encouraged compliance and increased risk of stroke.    Sixty minutes was spent with the patient.  Over 50% of the time was spent in counseling and answer questions.  F/U in 6 weeks    Signed electronically by: Mack Guise, MD  [facility]  [facility] [address] [place], [place] [address] Phone: [phone] FAX: [phone]   CC: Lance Sell, NP [place] [address] Phone: [phone]  Fax: [phone]    Return to Endocrinology clinic as below: Future  Appointments  Date Time Provider [place]  01/31/2018  4:00 PM Cameron Sprang, MD [facility] None  02/14/2018  3:40 PM Shamleffer, Melanie Crazier, MD [facility] None

## 2018-01-07 ENCOUNTER — Encounter: Payer: Self-pay | Admitting: Internal Medicine

## 2018-01-07 ENCOUNTER — Ambulatory Visit: Payer: BC Managed Care – PPO | Admitting: Internal Medicine

## 2018-01-07 VITALS — BP 166/94 | HR 88 | Ht 62.0 in | Wt 291.6 lb

## 2018-01-07 DIAGNOSIS — E1165 Type 2 diabetes mellitus with hyperglycemia: Secondary | ICD-10-CM

## 2018-01-07 DIAGNOSIS — Z794 Long term (current) use of insulin: Secondary | ICD-10-CM

## 2018-01-07 DIAGNOSIS — I10 Essential (primary) hypertension: Secondary | ICD-10-CM

## 2018-01-07 DIAGNOSIS — E1159 Type 2 diabetes mellitus with other circulatory complications: Secondary | ICD-10-CM | POA: Diagnosis not present

## 2018-01-07 DIAGNOSIS — E1142 Type 2 diabetes mellitus with diabetic polyneuropathy: Secondary | ICD-10-CM

## 2018-01-07 DIAGNOSIS — Z8673 Personal history of transient ischemic attack (TIA), and cerebral infarction without residual deficits: Secondary | ICD-10-CM

## 2018-01-07 LAB — POCT GLYCOSYLATED HEMOGLOBIN (HGB A1C): Hemoglobin A1C: 11.6 % — AB (ref 4.0–5.6)

## 2018-01-07 LAB — GLUCOSE, POCT (MANUAL RESULT ENTRY): POC Glucose: 274 mg/dl — AB (ref 70–99)

## 2018-01-07 MED ORDER — PIOGLITAZONE HCL 30 MG PO TABS: 30.0000 mg | ORAL_TABLET | Freq: Every day | ORAL | 11 refills | Status: DC

## 2018-01-07 MED ORDER — INSULIN DEGLUDEC 100 UNIT/ML ~~LOC~~ SOPN: 45.0000 [IU] | PEN_INJECTOR | Freq: Every day | SUBCUTANEOUS | 3 refills | Status: DC

## 2018-01-07 MED ORDER — FREESTYLE LIBRE 14 DAY SENSOR MISC: 2.0000 | Freq: Every day | 11 refills | Status: DC

## 2018-01-07 MED ORDER — FREESTYLE LIBRE 14 DAY READER DEVI: 1.0000 | Freq: Every day | 0 refills | Status: DC

## 2018-01-07 NOTE — Patient Instructions (Addendum)
-   Check sugars fasting and bedtime - Avoid sugar- sweetened beverages   - Start Pioglitazone 30 mg daily (call with swelling please) - Start Tresiba at 45 units daily - Continue trulicity at 1.5 mg weekly  - Stop Glipizide  - Stop Basaglar    HOW TO TREAT LOW BLOOD SUGARS (Blood sugar LESS THAN 70 MG/DL)  Please follow the RULE OF 15 for the treatment of hypoglycemia treatment (when your (blood sugars are less than 70 mg/dL)    STEP 1: Take 15 grams of carbohydrates when your blood sugar is low, which includes:   3-4 GLUCOSE TABS  OR  3-4 OZ OF JUICE OR REGULAR SODA OR  ONE TUBE OF GLUCOSE GEL     STEP 2: RECHECK blood sugar in 15 MINUTES STEP 3: If your blood sugar is still low at the 15 minute recheck --> then, go back to STEP 1 and treat AGAIN with another 15 grams of carbohydrates.

## 2018-01-11 ENCOUNTER — Telehealth: Payer: Self-pay | Admitting: Family

## 2018-01-11 NOTE — Telephone Encounter (Signed)
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

## 2018-01-11 NOTE — Telephone Encounter (Signed)
Copied from [place] [id number]. Topic: General - Other >> Jan 11, 2018  3:18 PM Keene Breath wrote: Reason for CRM: Patient called to request that the doctor take her out of work due to the stress, anxiety and depression she is feeling.  Patient is scared that this could cause her to have a stroke.  Please advise.  CB# [phone] >> Jan 11, 2018  5:05 PM Peace, Tammy L wrote: Was able to get Valere Dross to triage after holding around 10 minutes to get through to the [facility] for triage.

## 2018-01-14 ENCOUNTER — Encounter: Payer: Self-pay | Admitting: Nurse Practitioner

## 2018-01-14 ENCOUNTER — Ambulatory Visit: Payer: BC Managed Care – PPO | Admitting: Nurse Practitioner

## 2018-01-14 VITALS — BP 110/88 | HR 94 | Temp 98.5°F | Ht 62.0 in | Wt 289.0 lb

## 2018-01-14 DIAGNOSIS — F32A Depression, unspecified: Secondary | ICD-10-CM

## 2018-01-14 DIAGNOSIS — F329 Major depressive disorder, single episode, unspecified: Secondary | ICD-10-CM | POA: Diagnosis not present

## 2018-01-14 DIAGNOSIS — F419 Anxiety disorder, unspecified: Secondary | ICD-10-CM | POA: Diagnosis not present

## 2018-01-14 DIAGNOSIS — J40 Bronchitis, not specified as acute or chronic: Secondary | ICD-10-CM

## 2018-01-14 MED ORDER — AZITHROMYCIN 250 MG PO TABS: ORAL_TABLET | ORAL | 0 refills | Status: DC

## 2018-01-14 NOTE — Progress Notes (Signed)
Lindsay Gomez is a 47 y.o. female with the following history as recorded in EpicCare:  Patient Active Problem List   Diagnosis Date Noted  . Sore throat 01/03/2018  . Hepatic steatosis 08/16/2017  . 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 Cerebrovascular accident (CVA) ([place]) 03/14/2017  . Hypocalcemia 03/03/2017  . Cerebral thrombosis with cerebral infarction 03/03/2017  . Major depressive disorder, single episode, moderate ([place]) 10/28/2015  . Microcytic hypochromic anemia 07/29/2015  . Type 2 diabetes mellitus ([place]) 06/24/2015  . Depression 06/24/2015  . Obstructive sleep apnea 06/17/2015  . Anxiety and depression 04/24/2015  . Pre-op evaluation 01/09/2015  . Hyperlipidemia 01/06/2013  . Right lumbar radiculopathy 10/26/2012  . ANA positive 09/14/2012  . LVH (left ventricular hypertrophy) 12/08/2011  . Menorrhagia 09/24/2011  . Routine general medical examination at a health care facility 09/24/2011  . Carpal tunnel syndrome of right wrist 09/18/2010  . Hypokalemia 09/18/2010  . Morbid obesity ([place]) 11/29/2008  . Iron deficiency anemia 11/28/2008  . Essential hypertension 11/28/2008  . ALLERGIC RHINITIS 11/28/2008    Current Outpatient Medications  Medication Sig Dispense Refill  . aspirin (ASPIRIN 81) 81 MG EC tablet Take 81 mg by mouth daily. Swallow whole.    . Azilsartan-Chlorthalidone (EDARBYCLOR) 40-25 MG TABS Take 1 tablet by mouth daily. 90 tablet 0  . Blood Glucose Monitoring Suppl (ONE TOUCH ULTRA MINI) w/Device KIT Use device to test blood sugars 1-4 times daily as instructed. 1 each 0  . Continuous Blood Gluc Receiver (FREESTYLE LIBRE 14 DAY READER) DEVI 1 applicator by Does not apply route daily. 1 Device 0  . Continuous Blood Gluc Sensor (FREESTYLE LIBRE 14 DAY SENSOR) MISC 2 applicators by Does not apply route daily. 2 each 11  . glipiZIDE (GLUCOTROL XL) 5 MG 24 hr tablet Take 5 mg by mouth daily with breakfast.     . glucose blood test strip Use as instructed  100 each 12  . ibuprofen (ADVIL,MOTRIN) 800 MG tablet Take 800 mg by mouth every 8 (eight) hours as needed for moderate pain.    . Insulin Pen Needle (BD PEN NEEDLE NANO U/F) 32G X 4 MM MISC USE ONCE DAILY AS DIRECTED. 100 each 0  . Lancets MISC Use one lancet per test to check blood sugar 1-4 times daily. Dx code:E11.9 100 each 5  . potassium chloride SA (K-DUR,KLOR-CON) 20 MEQ tablet Take 1 tablet (20 mEq total) by mouth daily. 30 tablet 1  . TRULICITY 1.5 ZY/2.4MG SOPN Inject 1.5 mg as directed every Friday. 18 mL 1  . azithromycin (ZITHROMAX) 250 MG tablet Take 2 tablets today, then 1 tablet daily until complete 6 tablet 0  . insulin degludec (TRESIBA FLEXTOUCH) 100 UNIT/ML SOPN FlexTouch Pen Inject 0.45 mLs (45 Units total) into the skin daily. (Patient not taking: Reported on 01/14/2018) 15 mL 3  . pioglitazone (ACTOS) 30 MG tablet Take 1 tablet (30 mg total) by mouth daily. (Patient not taking: Reported on 01/14/2018) 30 tablet 11   No current facility-administered medications for this visit.     Allergies: Lisinopril  Past Medical History:  Diagnosis Date  . Allergy    rhinitis  . Anemia   . Anxiety   . Diabetes mellitus without complication ([place])   . Enlarged heart   . Hypertension   . Morbid obesity ([place])   . Shortness of breath dyspnea    with low iron    Past Surgical History:  Procedure Laterality Date  . ABDOMINAL HYSTERECTOMY N/A 01/29/2015  Procedure: TOTAL ABDOMINAL HYSTERECTOMY ;  Surgeon: Ena Dawley, MD;  Location: [facility];  Service: Gynecology;  Laterality: N/A;  . BILATERAL SALPINGECTOMY Bilateral 01/29/2015   Procedure: BILATERAL SALPINGECTOMY;  Surgeon: Ena Dawley, MD;  Location: [facility];  Service: Gynecology;  Laterality: Bilateral;  . CESAREAN SECTION    . KNEE SURGERY  1992   ? arthroscopic/ right    Family History  Problem Relation Age of Onset  . Hypertension Mother   . Diabetes Mother   . Coronary artery disease Father   . Heart failure  Father   . Heart attack Father   . Diabetes Father   . Hypertension Other   . Hyperlipidemia Other   . Cancer Paternal Grandmother        unknown type?  . Esophageal cancer Neg Hx   . Colon cancer Neg Hx   . Pancreatic cancer Neg Hx   . Stomach cancer Neg Hx     Social History   Tobacco Use  . Smoking status: Never Smoker  . Smokeless tobacco: Never Used  Substance Use Topics  . Alcohol use: No     Subjective:  [name] is here today for evaluation of an acute complaint of cough and anxiety and depression.  Cough- She reports Cough for about a week and half now, she saw another provider here on 01/03/18 for cough and diagnosed with virus, has been drinking hot tea, honey drops since but cough is no better, now coughing clear to white sputum.  She reports body aches, sore throat, nasal and chest congestion. She denies fevers.  Anxiety and depression- this is not a new problem, she reports history of anxiety and depression for about the past 2 years. She has been working with a counselor who recently told her that her anxiety and depression scores were much worse. She feels this is related to increased stress at work, often feels anxious, hopeless and worried at work. She denies SI, HI. She was on medication for anxiety and depression in past, does not recall name, prefers to avoid medications if possible but would like to see a psychiatrist for further evaluation.   ROS- See HPI  Objective:  Vitals:   01/14/18 1524  BP: 110/88  Pulse: 94  Temp: 98.5 F (36.9 C)  TempSrc: Oral  SpO2: 95%  Weight: 289 lb (131.1 kg)  Height: _0  (1.575 m)    General: Well developed, well nourished, in no acute distress  Skin : Warm and dry.  Head: Normocephalic and atraumatic  Eyes: Sclera and conjunctiva clear; pupils round and reactive to light; extraocular movements intact  Ears: External normal; canals clear; tympanic membranes normal  Oropharynx: Pink, supple. No suspicious  lesions  Neck: Supple without adenopathy  Lungs: Respirations unlabored; clear to auscultation bilaterally without wheeze, rales, rhonchi  CVS exam: normal rate and regular rhythm, S1 and S2 normal.   Extremities: No edema, cyanosis Vessels: Symmetric bilaterally  Neurologic: Alert and oriented; speech intact; face symmetrical; moves all extremities well; CNII-XII intact without focal deficit   Assessment:  1. Bronchitis   2. Anxiety and depression     Plan:   Return in about 1 month (around 02/13/2018).  Orders Placed This Encounter  Procedures  . Ambulatory referral to Psychiatry    Referral Priority:   Routine    Referral Type:   Psychiatric    Referral Reason:   Specialty Services Required    Requested Specialty:   Psychiatry    Number of Visits  Requested:   1    Requested Prescriptions   Signed Prescriptions Disp Refills  . azithromycin (ZITHROMAX) 250 MG tablet 6 tablet 0    Sig: Take 2 tablets today, then 1 tablet daily until complete    Bronchitis Due to duration of symptoms, no improvement, will give azithromycin course-dosing and side effects discussed Home management, red flags and return precautions including when to seek immediate care discussed and printed on AVS - azithromycin (ZITHROMAX) 250 MG tablet; Take 2 tablets today, then 1 tablet daily until complete  Dispense: 6 tablet; Refill: 0

## 2018-01-14 NOTE — Assessment & Plan Note (Signed)
Discussed options to treat anxiety and depression, she would prefer to see psychiatry for further treatment She has been thinking of FMLA paperwork, I told her I would provide 1 day per week x 3 months while awaiting psychiatry evaluation Continue regular f/u with counselor, additional education provided on AVS - Ambulatory referral to Psychiatry

## 2018-01-14 NOTE — Patient Instructions (Signed)
Please take azithromycin as prescribed.  Please follow up for fevers over 101, if your symptoms get worse, or if your symptoms dont get better with the antibiotic.   Mindfulness-Based Stress Reduction Mindfulness-based stress reduction (MBSR) is a program that helps people learn to practice mindfulness. Mindfulness is the practice of intentionally paying attention to the present moment. It can be learned and practiced through techniques such as education, breathing exercises, meditation, and yoga. MBSR includes several mindfulness techniques in one program. MBSR works best when you understand the treatment, are willing to try new things, and can commit to spending time practicing what you learn. MBSR training may include learning about:  How your emotions, thoughts, and reactions affect your body.  New ways to respond to things that cause negative thoughts to start (triggers).  How to notice your thoughts and let go of them.  Practicing awareness of everyday things that you normally do without thinking.  The techniques and goals of different types of meditation.  What are the benefits of MBSR? MBSR can have many benefits, which include helping you to:  Develop self-awareness. This refers to knowing and understanding yourself.  Learn skills and attitudes that help you to participate in your own health care.  Learn new ways to care for yourself.  Be more accepting about how things are, and let things go.  Be less judgmental and approach things with an open mind.  Be patient with yourself and trust yourself more.  MBSR has also been shown to:  Reduce negative emotions, such as depression and anxiety.  Improve memory and focus.  Change how you sense and approach pain.  Boost your body's ability to fight infections.  Help you connect better with other people.  Improve your sense of well-being.  Follow these instructions at home:  Find a local in-person or online MBSR  program.  Set aside some time regularly for mindfulness practice.  Find a mindfulness practice that works best for you. This may include one or more of the following: ? Meditation. Meditation involves focusing your mind on a certain thought or activity. ? Breathing awareness exercises. These help you to stay present by focusing on your breath. ? Body scan. For this practice, you lie down and pay attention to each part of your body from head to toe. You can identify tension and soreness and intentionally relax parts of your body. ? Yoga. Yoga involves stretching and breathing, and it can improve your ability to move and be flexible. It can also provide an experience of testing your body's limits, which can help you release stress. ? Mindful eating. This way of eating involves focusing on the taste, texture, color, and smell of each bite of food. Because this slows down eating and helps you feel full sooner, it can be an important part of a weight-loss plan.  Find a podcast or recording that provides guidance for breathing awareness, body scan, or meditation exercises. You can listen to these any time when you have a free moment to rest without distractions.  Follow your treatment plan as told by your health care provider. This may include taking regular medicines and making changes to your diet or lifestyle as recommended. How to practice mindfulness To do a basic awareness exercise:  Find a comfortable place to sit.  Pay attention to the present moment. Observe your thoughts, feelings, and surroundings just as they are.  Avoid placing judgment on yourself, your feelings, or your surroundings. Make note of any judgment that  comes up, and let it go.  Your mind may wander, and that is okay. Make note of when your thoughts drift, and return your attention to the present moment.  To do basic mindfulness meditation:  Find a comfortable place to sit. This may include a stable chair or a firm  floor cushion. ? Sit upright with your back straight. Let your arms fall next to your side with your hands resting on your legs. ? If sitting in a chair, rest your feet flat on the floor. ? If sitting on a cushion, cross your legs in front of you.  Keep your head in a neutral position with your chin dropped slightly. Relax your jaw and rest the tip of your tongue on the roof of your mouth. Drop your gaze to the floor. You can close your eyes if you like.  Breathe normally and pay attention to your breath. Feel the air moving in and out of your nose. Feel your belly expanding and relaxing with each breath.  Your mind may wander, and that is okay. Make note of when your thoughts drift, and return your attention to your breath.  Avoid placing judgment on yourself, your feelings, or your surroundings. Make note of any judgment or feelings that come up, let them go, and bring your attention back to your breath.  When you are ready, lift your gaze or open your eyes. Pay attention to how your body feels after the meditation.  Where to find more information: You can find more information about MBSR from:  Your health care provider.  Community-based meditation centers or programs.  Programs offered near you.  Summary  Mindfulness-based stress reduction (MBSR) is a program that teaches you how to intentionally pay attention to the present moment. It is used with other treatments to help you cope better with daily stress, emotions, and pain.  MBSR focuses on developing self-awareness, which allows you to respond to life stress without judgment or negative emotions.  MBSR programs may involve learning different mindfulness practices, such as breathing exercises, meditation, yoga, body scan, or mindful eating. Find a mindfulness practice that works best for you, and set aside time for it on a regular basis. This information is not intended to replace advice given to you by your health care  provider. Make sure you discuss any questions you have with your health care provider. Document Released: 07/09/2016 Document Revised: 07/09/2016 Document Reviewed: 07/09/2016 Elsevier Interactive Patient Education  [employer].

## 2018-01-31 ENCOUNTER — Other Ambulatory Visit: Payer: Self-pay

## 2018-01-31 ENCOUNTER — Encounter: Payer: Self-pay | Admitting: Neurology

## 2018-01-31 ENCOUNTER — Ambulatory Visit: Payer: Self-pay | Admitting: Neurology

## 2018-01-31 ENCOUNTER — Ambulatory Visit: Payer: BC Managed Care – PPO | Admitting: Neurology

## 2018-01-31 VITALS — BP 176/104 | HR 81 | Ht 62.0 in | Wt 292.0 lb

## 2018-01-31 DIAGNOSIS — R200 Anesthesia of skin: Secondary | ICD-10-CM

## 2018-01-31 DIAGNOSIS — I639 Cerebral infarction, unspecified: Secondary | ICD-10-CM | POA: Diagnosis not present

## 2018-01-31 DIAGNOSIS — I6381 Other cerebral infarction due to occlusion or stenosis of small artery: Secondary | ICD-10-CM

## 2018-01-31 MED ORDER — PREGABALIN 75 MG PO CAPS: 75.0000 mg | ORAL_CAPSULE | Freq: Every day | ORAL | 0 refills | Status: DC

## 2018-01-31 MED ORDER — PREGABALIN 75 MG PO CAPS: 75.0000 mg | ORAL_CAPSULE | Freq: Two times a day (BID) | ORAL | 5 refills | Status: DC

## 2018-01-31 NOTE — Patient Instructions (Addendum)
1. Start Lyrica 75mg : take 1 capsule every night for 1 week, then increase to 1 capsule twice a day. This can help with the numbness/tingling in your hand and feet  2. Continue daily aspirin, control of blood pressure, cholesterol, and sugar levels.  3. For any sudden changes in symptoms, go to ER immediately  4. Call our office for an update in 2 months, we can increase dose of Lyrica if needed. Follow-up in 6 months

## 2018-01-31 NOTE — Progress Notes (Signed)
NEUROLOGY FOLLOW UP OFFICE NOTE  Lindsay Gomez [id number] [birth year]  HISTORY OF PRESENT ILLNESS: I had the pleasure of seeing Lindsay Gomez in follow-up in the neurology clinic on 01/31/2018.  The patient was last seen 6 months ago for a left thalamic stroke with residual right-sided paresthesias. She is alone in the office today. She is upset that the numbness and tingling on her right side is unchanged, mostly affecting the right side of her face and the right hand. Sometimes she has a hard time writing. She also has neuropathy affecting both feet. She reports right hip pain and left flank pain. She was given a prescription for Lyrica on her last visit but did not start it. She continues to have difficulties with glucose levels, last HbA1c was 11.6, she has started seeing a new endocrinologist. She denies any headaches, dizziness, vision changes, focal weakness, no falls.   History on Initial Assessment 07/09/2017: This is a 47 year old right-handed woman with a history of hypertension, diabetes, left thalamic stroke in December 2018, presenting for right-sided paresthesias. She presented to the ER on 03/03/17 with sudden onset right-sided tingling and numbness in the face, arm, and leg. No focal weakness. Her SBP was 180, glucose was 490. NIHSS of 1. 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 Echo showed an EF of 81-15%, grade 1 diastolic dysfunction, left atrium moderately dilated, no PFO. Carotid dopplers no significant stenosis. Stroke etiology likely due to small vessel disease. She was discharged home on full dose aspirin, with continued lipid, glucose, and BP control recommended. She has had difficulties managing her glucose levels, last HbA1c in 02/2017 was 12.7, LDL 126.   Since the stroke, she  continues to have right hand numbness and tingling with occasional sharp pains in her fingertips. She also has numbness in her right cheek, "like novocaine." Her leg is better, but sometimes the ankle bothers her and goes to sleep. She has occasional right lateral thigh pain when sitting for prolonged periods. She denies any weakness, it hurts to write or type sometimes. She works as a Oncologist, symptoms have mostly not affected work except for the pain when writing/typing. She has occasional headaches with pressure in her left eye going across her forehead occurring every few weeks. No nausea/vomiting, photo/phonophobia. She goes to sleep and does not take medication. Headaches started right before the stroke occurred. She denies any dizziness. Sometimes pain aggravates her right cheek. She occasionally finds herself coughing when swallowing since the stroke. Vision is occasionally blurred. She has not been taking her aspirin regularly. She was recently on vacation and missed her BP medications. She takes her insulin regularly but sugar levels still range between 300-400. She recalls taking gabapentin for her nerves, it caused extreme anxiety and depression. There is a family history of stroke in her great aunt, her father had a heart attack.   PAST MEDICAL HISTORY: Past Medical History:  Diagnosis Date  . Allergy    rhinitis  . Anemia   . Anxiety   . Diabetes mellitus without complication ([place])   . Enlarged heart   . Hypertension   . Morbid obesity ([place])   . Shortness of breath dyspnea    with low iron    MEDICATIONS: Current Outpatient Medications on File Prior to Visit  Medication Sig Dispense Refill  .  aspirin (ASPIRIN 81) 81 MG EC tablet Take 81 mg by mouth daily. Swallow whole.    . Azilsartan-Chlorthalidone (EDARBYCLOR) 40-25 MG TABS Take 1 tablet by mouth daily. 90 tablet 0  . azithromycin (ZITHROMAX) 250 MG tablet Take 2 tablets today, then 1 tablet daily until complete 6  tablet 0  . Blood Glucose Monitoring Suppl (ONE TOUCH ULTRA MINI) w/Device KIT Use device to test blood sugars 1-4 times daily as instructed. 1 each 0  . Continuous Blood Gluc Receiver (FREESTYLE LIBRE 14 DAY READER) DEVI 1 applicator by Does not apply route daily. 1 Device 0  . Continuous Blood Gluc Sensor (FREESTYLE LIBRE 14 DAY SENSOR) MISC 2 applicators by Does not apply route daily. 2 each 11  . glipiZIDE (GLUCOTROL XL) 5 MG 24 hr tablet Take 5 mg by mouth daily with breakfast.     . glucose blood test strip Use as instructed 100 each 12  . ibuprofen (ADVIL,MOTRIN) 800 MG tablet Take 800 mg by mouth every 8 (eight) hours as needed for moderate pain.    Marland Kitchen insulin degludec (TRESIBA FLEXTOUCH) 100 UNIT/ML SOPN FlexTouch Pen Inject 0.45 mLs (45 Units total) into the skin daily. (Patient not taking: Reported on 01/14/2018) 15 mL 3  . Insulin Pen Needle (BD PEN NEEDLE NANO U/F) 32G X 4 MM MISC USE ONCE DAILY AS DIRECTED. 100 each 0  . Lancets MISC Use one lancet per test to check blood sugar 1-4 times daily. Dx code:E11.9 100 each 5  . pioglitazone (ACTOS) 30 MG tablet Take 1 tablet (30 mg total) by mouth daily. (Patient not taking: Reported on 01/14/2018) 30 tablet 11  . potassium chloride SA (K-DUR,KLOR-CON) 20 MEQ tablet Take 1 tablet (20 mEq total) by mouth daily. 30 tablet 1  . TRULICITY 1.5 GO/1.1XB SOPN Inject 1.5 mg as directed every Friday. 18 mL 1   No current facility-administered medications on file prior to visit.     ALLERGIES: Allergies  Allergen Reactions  . Lisinopril Other (See Comments)    Cough     FAMILY HISTORY: Family History  Problem Relation Age of Onset  . Hypertension Mother   . Diabetes Mother   . Coronary artery disease Father   . Heart failure Father   . Heart attack Father   . Diabetes Father   . Hypertension Other   . Hyperlipidemia Other   . Cancer Paternal Grandmother        unknown type?  . Esophageal cancer Neg Hx   . Colon cancer Neg Hx   .  Pancreatic cancer Neg Hx   . Stomach cancer Neg Hx     SOCIAL HISTORY: Social History   Socioeconomic History  . Marital status: Single    Spouse name: Not on file  . Number of children: Not on file  . Years of education: Not on file  . Highest education level: Not on file  Occupational History  . Not on file  Social Needs  . Financial resource strain: Not on file  . Food insecurity:    Worry: Not on file    Inability: Not on file  . Transportation needs:    Medical: Not on file    Non-medical: Not on file  Tobacco Use  . Smoking status: Never Smoker  . Smokeless tobacco: Never Used  Substance and Sexual Activity  . Alcohol use: No  . Drug use: No  . Sexual activity: Not Currently    Birth control/protection: None  Lifestyle  . Physical activity:  Days per week: Not on file    Minutes per session: Not on file  . Stress: Not on file  Relationships  . Social connections:    Talks on phone: Not on file    Gets together: Not on file    Attends religious service: Not on file    Active member of club or organization: Not on file    Attends meetings of clubs or organizations: Not on file    Relationship status: Not on file  . Intimate partner violence:    Fear of current or ex partner: Not on file    Emotionally abused: Not on file    Physically abused: Not on file    Forced sexual activity: Not on file  Other Topics Concern  . Not on file  Social History Narrative   Pt lives alone in 2 story home   Has 2 children   Masters degree   Works as an Tourist information centre manager     REVIEW OF SYSTEMS: Constitutional: No fevers, chills, or sweats, no generalized fatigue, change in appetite Eyes: No visual changes, double vision, eye pain Ear, nose and throat: No hearing loss, ear pain, nasal congestion, sore throat Cardiovascular: No chest pain, palpitations Respiratory:  No shortness of breath at rest or with exertion, wheezes GastrointestinaI: No nausea, vomiting, diarrhea,  abdominal pain, fecal incontinence Genitourinary:  No dysuria, urinary retention or frequency Musculoskeletal:  No neck pain, back pain Integumentary: No rash, pruritus, skin lesions Neurological: as above Psychiatric: No depression, insomnia, anxiety Endocrine: No palpitations, fatigue, diaphoresis, mood swings, change in appetite, change in weight, increased thirst Hematologic/Lymphatic:  No anemia, purpura, petechiae. Allergic/Immunologic: no itchy/runny eyes, nasal congestion, recent allergic reactions, rashes  PHYSICAL EXAM: Vitals:   01/31/18 1605  BP: (!) 176/104  Pulse: 81  SpO2: 98%   General: No acute distress Head:  Normocephalic/atraumatic Neck: supple, no paraspinal tenderness, full range of motion Heart:  Regular rate and rhythm Lungs:  Clear to auscultation bilaterally Back: No paraspinal tenderness Skin/Extremities: No rash, no edema Neurological Exam: alert and oriented to person, place, and time. No aphasia or dysarthria. Fund of knowledge is appropriate.  Recent and remote memory are intact.  Attention and concentration are normal.    Able to name objects and repeat phrases. Cranial nerves: Pupils equal, round, reactive to light. Extraocular movements intact with no nystagmus. Visual fields full. Facial sensation intact to cold and pin. No facial asymmetry. Tongue, uvula, palate midline.  Motor: Bulk and tone normal, muscle strength 5/5 throughout with no pronator drift.  Sensation to light touch, temperature, pin intact, including over right hand. Deep tendon reflexes +1 throughout, toes downgoing.  Finger to nose testing intact.  Gait narrow-based and steady, able to tandem walk adequately.  Romberg negative.  IMPRESSION: This is a 47 yo RH woman with a history of hypertension, diabetes, left thalamic stroke in December 2018 with residual right-sided paresthesias. She is upset she continues to have these symptoms, which we again discussed today as residual symptoms  from the damage from her stroke in the thalamus. We again discussed likely mechanism of stroke (small vessel disease), and the importance of better control of vascular risk factors, particularly glucose levels. We had previously discussed Lyrica for symptomatic treatment, she is now agreeable to starting the medication, side effects discussed, start 76m qhs x 1 week, then increase to 752mBID. This can further be uptitrated as tolerated, she will call our office for an update in 2 months. Continue daily aspirin. She  knows to go to the ER for any sudden changes in symptoms. Follow-up in 6 months, she knows to call for any changes.   Thank you for allowing me to participate in her care.  Please do not hesitate to call for any questions or concerns.  The duration of this appointment visit was 30 minutes of face-to-face time with the patient.  Greater than 50% of this time was spent in counseling, explanation of diagnosis, planning of further management, and coordination of care.   Ellouise Newer, M.D.   CC: Caesar Chestnut, NP

## 2018-02-08 NOTE — Progress Notes (Signed)
Name: Lindsay Gomez  Age/ Sex: 47 y.o., female   MRN/ DOB: [id number], 1970-09-12     PCP: Lance Sell, NP   Reason for Endocrinology Evaluation: Type 2 Diabetes Mellitus  Initial Endocrine Consultative Visit: 01/07/18    PATIENT IDENTIFIER: Lindsay Gomez is a 47 y.o. female with a past medical history of HTN, Obesity,CVA, OSA not on treatment and T2DM . The patient has followed with Endocrinology clinic since 01/07/18 for consultative assistance with management of her diabetes.  DIABETIC HISTORY:  Lindsay Gomez was diagnosed with T2DM in 2016,She is intolerant to Metformin.On her initial visit to our clinic she was on Basaglar, glipizide and Trulicity, she did admit to noncompliance with her meds, she would take basaglar once a month. Her hemoglobin A1c has ranged from  6.3% in 2014, peaking at 14.1% in 2018  Pt with hx of recurrent yeast infections Lives with boyfriend who has T1DM and on dialysis  SUBJECTIVE:   During the last visit (01/07/18): We started Actos 30 mg daily, started [place] at 45 units, continued Trulicity and stopped Glipizide and basaglar  Today (02/14/2018): Lindsay Gomez is here for a 6 weeks followup on diabetes management.  She checks her blood sugars 1-2 times daily through the [facility]. The patient had the prescription since 10/25 but didn;t start wearing the sensor until 11/25. The patient has not had hypoglycemic episodes since the last clinic visit. Otherwise, the patient has not required any recent emergency interventions for hypoglycemia and has not  had recent hospitalizations secondary to hyper or hypoglycemic episodes.   Patient believes she is doing better with snacks , this week she did eat some cookies and BG was in 300's. But overall she has not seen BG's in the 300's for a while except for this day. She also had one regular soda in the past week which is  an improvement from the past.   She denies any side effects with actos. She has been more compliant with taking insulin (she has not started tresiba yet, as she would like to finish basaglar first)  ROS: As per HPI and as detailed below: Review of Systems  Constitutional: Positive for malaise/fatigue. Negative for weight loss.  Respiratory: Negative for cough and shortness of breath.   Cardiovascular: Negative for chest pain and palpitations.  Gastrointestinal: Positive for constipation and diarrhea. Negative for nausea.      HOME DIABETES REGIMEN:  Pioglitazone 30 mg daily Basaglar 66 units daily Trulicity at 1.5 mg weekly       CONTINUOUS GLUCOSE MONITORING RECORD INTERPRETATION    Dates of Recording: 11/19-12/04/2017  Sensor description: [employer]  Results statistics:   CGM use % of time 23  Average and SD 235/61.8  Time in range       25 %  % Time Above 180 75  % Time above 250 40  % Time Below target 0    Glycemic patterns summary: Hyperglycemia noted through the day and night   Hyperglycemic episodes  All day/night   Hypoglycemic episodes occurred no  Overnight periods: hyperglycemia      HISTORY:  Past Medical History:  Past Medical History:  Diagnosis Date  . Allergy    rhinitis  . Anemia   . Anxiety   . Diabetes mellitus without complication ([place])   . Enlarged heart   . Hypertension   . Morbid obesity ([place])   . Shortness of breath dyspnea    with low iron   Past  Surgical History:  Past Surgical History:  Procedure Laterality Date  . ABDOMINAL HYSTERECTOMY N/A 01/29/2015   Procedure: TOTAL ABDOMINAL HYSTERECTOMY ;  Surgeon: Ena Dawley, MD;  Location: [facility];  Service: Gynecology;  Laterality: N/A;  . BILATERAL SALPINGECTOMY Bilateral 01/29/2015   Procedure: BILATERAL SALPINGECTOMY;  Surgeon: Ena Dawley, MD;  Location: [facility];  Service: Gynecology;  Laterality: Bilateral;  . CESAREAN SECTION    . KNEE SURGERY  1992   ?  arthroscopic/ right    Social History:  reports that she has never smoked. She has never used smokeless tobacco. She reports that she does not drink alcohol or use drugs. Family History:  Family History  Problem Relation Age of Onset  . Hypertension Mother   . Diabetes Mother   . Coronary artery disease Father   . Heart failure Father   . Heart attack Father   . Diabetes Father   . Hypertension Other   . Hyperlipidemia Other   . Cancer Paternal Grandmother        unknown type?  . Esophageal cancer Neg Hx   . Colon cancer Neg Hx   . Pancreatic cancer Neg Hx   . Stomach cancer Neg Hx      HOME MEDICATIONS: Allergies as of 02/14/2018      Reactions   Lisinopril Other (See Comments)   Cough       Medication List        Accurate as of 02/14/18  4:07 PM. Always use your most recent med list.          ASPIRIN 81 81 MG EC tablet Generic drug:  aspirin Take 81 mg by mouth daily. Swallow whole.   Azilsartan-Chlorthalidone 40-25 MG Tabs Take 1 tablet by mouth daily.   azithromycin 250 MG tablet Commonly known as:  ZITHROMAX Take 2 tablets today, then 1 tablet daily until complete   FREESTYLE LIBRE 14 DAY READER Devi 1 applicator by Does not apply route daily.   FREESTYLE LIBRE 14 DAY SENSOR Misc 2 applicators by Does not apply route daily.   glipiZIDE 5 MG 24 hr tablet Commonly known as:  GLUCOTROL XL Take 5 mg by mouth daily with breakfast.   glucose blood test strip Use as instructed   ibuprofen 800 MG tablet Commonly known as:  ADVIL,MOTRIN Take 800 mg by mouth every 8 (eight) hours as needed for moderate pain.   insulin degludec 100 UNIT/ML Sopn FlexTouch Pen Commonly known as:  TRESIBA Inject 0.45 mLs (45 Units total) into the skin daily.   Insulin Pen Needle 32G X 4 MM Misc USE ONCE DAILY AS DIRECTED.   Lancets Misc Use one lancet per test to check blood sugar 1-4 times daily. Dx code:E11.9   ONE TOUCH ULTRA MINI w/Device Kit Use device to test  blood sugars 1-4 times daily as instructed.   pioglitazone 30 MG tablet Commonly known as:  ACTOS Take 1 tablet (30 mg total) by mouth daily.   potassium chloride SA 20 MEQ tablet Commonly known as:  K-DUR,KLOR-CON Take 1 tablet (20 mEq total) by mouth daily.   pregabalin 75 MG capsule Commonly known as:  LYRICA Take 1 capsule (75 mg total) by mouth 2 (two) times daily.   pregabalin 75 MG capsule Commonly known as:  LYRICA Take 1 capsule (75 mg total) by mouth daily.   TRULICITY 1.5 UY/4.0HK Sopn Generic drug:  Dulaglutide Inject 1.5 mg as directed every Friday.        OBJECTIVE:   Vital  Signs: BP 132/68 (BP Location: Right Arm, Patient Position: Sitting, Cuff Size: Normal)   Pulse (!) 104   Ht 5' 2"  (1.575 m)   Wt 293 lb (132.9 kg)   LMP 12/18/2014 (Exact Date)   SpO2 97%   BMI 53.59 kg/m   Wt Readings from Last 3 Encounters:  02/14/18 293 lb (132.9 kg)  01/31/18 292 lb (132.5 kg)  01/14/18 289 lb (131.1 kg)     Exam: General: Pt appears well and is in NAD  Lungs: Clear with good BS bilat with no rales, rhonchi, or wheezes  Heart: RRR with normal S1 and S2 and no gallops; no murmurs; no rub  Abdomen: Normoactive bowel sounds, soft, nontender, without masses or organomegaly palpable  Extremities: +1 pretibial edema. No tremor.   Neuro: MS is good with appropriate affect, pt is alert and Ox3       ASSESSMENT / PLAN / RECOMMENDATIONS:   1) Type 2 Diabetes Mellitus, poorly controlled, With microvascular and macrovascular complications - Most recent A1c of 11.6 %. Goal A1c < 7.0 %.    Plan: - Pt is making some lifestyle changes.  - Her average glucose readings are equivalent to 9.6% based on a week worth readings.Despite that , her BG's continue to be above goal.  - I have encouraged her to continue with lifestyle changes and using the sensor more often.  - We have discussed adding another agent such as SGLT-2 inhibiotrs or prandial insulin  - Pt declined  SGLT-2 inhibitors due to risk of genital infections. She believes she can continue to work on her glucose to a goal of less then 180 mg/dL. Will recheck in another 6 weeks and if BG's not at goal will revisit the subject  - Unable to increase actos dose due to LE edema  -  MEDICATIONS:  Continue Basaglar 66 units - Switch to Tresiba 45 units daily   Continue Actos 30 mg daily   Trulicity 1.5 mg weekly   EDUCATION / INSTRUCTIONS:  BG monitoring instructions: Patient is instructed to check her blood sugars 4 times a day, before meals and bedtime.  Call [facility] if: BG persistently < 70 or > 300. . I reviewed the Rule of 15 for the treatment of hypoglycemia in detail with the patient. Literature supplied.    2 ) Morbid Obesity , BMI 54.3   - We discussed weight loss surgery, but patient has declined due to fear or complications. We discussed sleeve gastrectomy maybe less invasive then Rou-en-y but she has not interest.  - We also discussed benefit of high chance of diabetes remission.   F/U in 6 weeks    Signed electronically by: Mack Guise, MD  [facility]  [facility] [address] [place], [place] [address] Phone: [phone] FAX: [phone]   CC: Lance Sell, NP [place] [address] Phone: [phone]  Fax: [phone]  Return to Endocrinology clinic as below: No future appointments.

## 2018-02-14 ENCOUNTER — Encounter: Payer: Self-pay | Admitting: Internal Medicine

## 2018-02-14 ENCOUNTER — Ambulatory Visit: Payer: BC Managed Care – PPO | Admitting: Internal Medicine

## 2018-02-14 VITALS — BP 132/68 | HR 104 | Ht 62.0 in | Wt 293.0 lb

## 2018-02-14 DIAGNOSIS — Z794 Long term (current) use of insulin: Secondary | ICD-10-CM

## 2018-02-14 DIAGNOSIS — Z6841 Body Mass Index (BMI) 40.0 and over, adult: Secondary | ICD-10-CM

## 2018-02-14 DIAGNOSIS — E1159 Type 2 diabetes mellitus with other circulatory complications: Secondary | ICD-10-CM | POA: Diagnosis not present

## 2018-02-14 DIAGNOSIS — E1165 Type 2 diabetes mellitus with hyperglycemia: Secondary | ICD-10-CM | POA: Diagnosis not present

## 2018-02-14 LAB — GLUCOSE, POCT (MANUAL RESULT ENTRY): POC Glucose: 234 mg/dl — AB (ref 70–99)

## 2018-02-14 NOTE — Patient Instructions (Signed)
-   Continue checking sugars every 8 hours.  - Avoid sugar- sweetened beverages    -Pioglitazone 30 mg daily (call with swelling please) - Tresiba at 45 units daily - Continue trulicity at 1.5 mg weekly     HOW TO TREAT LOW BLOOD SUGARS (Blood sugar LESS THAN 70 MG/DL)  Please follow the RULE OF 15 for the treatment of hypoglycemia treatment (when your (blood sugars are less than 70 mg/dL)    STEP 1: Take 15 grams of carbohydrates when your blood sugar is low, which includes:   3-4 GLUCOSE TABS  OR  3-4 OZ OF JUICE OR REGULAR SODA OR  ONE TUBE OF GLUCOSE GEL     STEP 2: RECHECK blood sugar in 15 MINUTES STEP 3: If your blood sugar is still low at the 15 minute recheck --> then, go back to STEP 1 and treat AGAIN with another 15 grams of carbohydrates.

## 2018-03-14 ENCOUNTER — Telehealth: Payer: Self-pay | Admitting: Nurse Practitioner

## 2018-03-14 NOTE — Telephone Encounter (Signed)
Patient informed we do not, she will call back about the 10 day sensor.

## 2018-03-14 NOTE — Telephone Encounter (Signed)
Copied from [place] [id number]. Topic: Quick Communication - See Telephone Encounter >> Mar 14, 2018  4:29 PM Blase Mess A wrote: CRM for notification. See Telephone encounter for: 03/14/18.  Patient is calling to see if the office has any coupons for the [place] sensor 14 day please advise [phone]

## 2018-03-17 ENCOUNTER — Telehealth: Payer: Self-pay

## 2018-03-17 ENCOUNTER — Other Ambulatory Visit: Payer: Self-pay | Admitting: Internal Medicine

## 2018-03-17 MED ORDER — FREESTYLE LIBRE 14 DAY SENSOR MISC: 2.0000 | Freq: Every day | 11 refills | Status: DC

## 2018-03-17 NOTE — Telephone Encounter (Signed)
Prior Authorization initiated On 03/17/2018 for freestyle libre 14 sens though cover my meds. Authorization key code [id number] Awaiting response from insurance for either denial or approval.

## 2018-03-24 ENCOUNTER — Telehealth: Payer: Self-pay | Admitting: Nurse Practitioner

## 2018-03-24 ENCOUNTER — Ambulatory Visit (INDEPENDENT_AMBULATORY_CARE_PROVIDER_SITE_OTHER)
Admission: RE | Admit: 2018-03-24 | Discharge: 2018-03-24 | Disposition: A | Discharge status: Home / Self Care | Payer: BC Managed Care – PPO | Source: Ambulatory Visit | Attending: Nurse Practitioner | Admitting: Nurse Practitioner

## 2018-03-24 ENCOUNTER — Encounter: Payer: Self-pay | Admitting: Nurse Practitioner

## 2018-03-24 ENCOUNTER — Ambulatory Visit (INDEPENDENT_AMBULATORY_CARE_PROVIDER_SITE_OTHER): Payer: BC Managed Care – PPO | Admitting: Nurse Practitioner

## 2018-03-24 VITALS — BP 128/76 | HR 85 | Ht 62.0 in | Wt 293.0 lb

## 2018-03-24 DIAGNOSIS — M5441 Lumbago with sciatica, right side: Secondary | ICD-10-CM

## 2018-03-24 DIAGNOSIS — G8929 Other chronic pain: Secondary | ICD-10-CM

## 2018-03-24 DIAGNOSIS — E1159 Type 2 diabetes mellitus with other circulatory complications: Secondary | ICD-10-CM | POA: Diagnosis not present

## 2018-03-24 DIAGNOSIS — M5442 Lumbago with sciatica, left side: Secondary | ICD-10-CM

## 2018-03-24 DIAGNOSIS — I10 Essential (primary) hypertension: Secondary | ICD-10-CM

## 2018-03-24 LAB — GLUCOSE, POCT (MANUAL RESULT ENTRY): POC Glucose: 316 mg/dl — AB (ref 70–99)

## 2018-03-24 MED ORDER — AZILSARTAN-CHLORTHALIDONE 40-25 MG PO TABS: 1.0000 | ORAL_TABLET | Freq: Every day | ORAL | 0 refills | Status: DC

## 2018-03-24 MED ORDER — ASPIRIN 81 MG PO TBEC: 81.0000 mg | DELAYED_RELEASE_TABLET | Freq: Every day | ORAL | 1 refills | Status: DC

## 2018-03-24 MED ORDER — MELOXICAM 7.5 MG PO TABS: 7.5000 mg | ORAL_TABLET | Freq: Every day | ORAL | 0 refills | Status: DC

## 2018-03-24 NOTE — Patient Instructions (Signed)
Head downstairs for back xray  Start mobic once daily  Start daily back exercises   Back Exercises If you have pain in your back, do these exercises 2-3 times each day or as told by your doctor. When the pain goes away, do the exercises once each day, but repeat the steps more times for each exercise (do more repetitions). If you do not have pain in your back, do these exercises once each day or as told by your doctor. Exercises Single Knee to Chest Do these steps 3-5 times in a row for each leg: 1. Lie on your back on a firm bed or the floor with your legs stretched out. 2. Bring one knee to your chest. 3. Hold your knee to your chest by grabbing your knee or thigh. 4. Pull on your knee until you feel a gentle stretch in your lower back. 5. Keep doing the stretch for 10-30 seconds. 6. Slowly let go of your leg and straighten it. Pelvic Tilt Do these steps 5-10 times in a row: 1. Lie on your back on a firm bed or the floor with your legs stretched out. 2. Bend your knees so they point up to the ceiling. Your feet should be flat on the floor. 3. Tighten your lower belly (abdomen) muscles to press your lower back against the floor. This will make your tailbone point up to the ceiling instead of pointing down to your feet or the floor. 4. Stay in this position for 5-10 seconds while you gently tighten your muscles and breathe evenly. Cat-Cow Do these steps until your lower back bends more easily: 1. Get on your hands and knees on a firm surface. Keep your hands under your shoulders, and keep your knees under your hips. You may put padding under your knees. 2. Let your head hang down, and make your tailbone point down to the floor so your lower back is round like the back of a cat. 3. Stay in this position for 5 seconds. 4. Slowly lift your head and make your tailbone point up to the ceiling so your back hangs low (sags) like the back of a cow. 5. Stay in this position for 5  seconds.  Press-Ups Do these steps 5-10 times in a row: 1. Lie on your belly (face-down) on the floor. 2. Place your hands near your head, about shoulder-width apart. 3. While you keep your back relaxed and keep your hips on the floor, slowly straighten your arms to raise the top half of your body and lift your shoulders. Do not use your back muscles. To make yourself more comfortable, you may change where you place your hands. 4. Stay in this position for 5 seconds. 5. Slowly return to lying flat on the floor.  Bridges Do these steps 10 times in a row: 1. Lie on your back on a firm surface. 2. Bend your knees so they point up to the ceiling. Your feet should be flat on the floor. 3. Tighten your butt muscles and lift your butt off of the floor until your waist is almost as high as your knees. If you do not feel the muscles working in your butt and the back of your thighs, slide your feet 1-2 inches farther away from your butt. 4. Stay in this position for 3-5 seconds. 5. Slowly lower your butt to the floor, and let your butt muscles relax. If this exercise is too easy, try doing it with your arms crossed over your chest.  Belly Crunches Do these steps 5-10 times in a row: 1. Lie on your back on a firm bed or the floor with your legs stretched out. 2. Bend your knees so they point up to the ceiling. Your feet should be flat on the floor. 3. Cross your arms over your chest. 4. Tip your chin a little bit toward your chest but do not bend your neck. 5. Tighten your belly muscles and slowly raise your chest just enough to lift your shoulder blades a tiny bit off of the floor. 6. Slowly lower your chest and your head to the floor. Back Lifts Do these steps 5-10 times in a row: 1. Lie on your belly (face-down) with your arms at your sides, and rest your forehead on the floor. 2. Tighten the muscles in your legs and your butt. 3. Slowly lift your chest off of the floor while you keep your hips  on the floor. Keep the back of your head in line with the curve in your back. Look at the floor while you do this. 4. Stay in this position for 3-5 seconds. 5. Slowly lower your chest and your face to the floor. Contact a doctor if:  Your back pain gets a lot worse when you do an exercise.  Your back pain does not lessen 2 hours after you exercise. If you have any of these problems, stop doing the exercises. Do not do them again unless your doctor says it is okay. Get help right away if:  You have sudden, very bad back pain. If this happens, stop doing the exercises. Do not do them again unless your doctor says it is okay. This information is not intended to replace advice given to you by your health care provider. Make sure you discuss any questions you have with your health care provider. Document Released: 04/04/2010 Document Revised: 11/24/2017 Document Reviewed: 04/26/2014 Elsevier Interactive Patient Education  [employer].

## 2018-03-24 NOTE — Progress Notes (Signed)
Lindsay Gomez is a 48 y.o. female with the following history as recorded in EpicCare:  Patient Active Problem List   Diagnosis Date Noted  . Sore throat 01/03/2018  . Hepatic steatosis 08/16/2017  . 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 Cerebrovascular accident (CVA) ([place]) 03/14/2017  . Hypocalcemia 03/03/2017  . Cerebral thrombosis with cerebral infarction 03/03/2017  . Major depressive disorder, single episode, moderate ([place]) 10/28/2015  . Microcytic hypochromic anemia 07/29/2015  . Type 2 diabetes mellitus ([place]) 06/24/2015  . Depression 06/24/2015  . Obstructive sleep apnea 06/17/2015  . Anxiety and depression 04/24/2015  . Pre-op evaluation 01/09/2015  . Hyperlipidemia 01/06/2013  . Right lumbar radiculopathy 10/26/2012  . ANA positive 09/14/2012  . LVH (left ventricular hypertrophy) 12/08/2011  . Menorrhagia 09/24/2011  . Routine general medical examination at a health care facility 09/24/2011  . Carpal tunnel syndrome of right wrist 09/18/2010  . Hypokalemia 09/18/2010  . Morbid obesity ([place]) 11/29/2008  . Iron deficiency anemia 11/28/2008  . Essential hypertension 11/28/2008  . ALLERGIC RHINITIS 11/28/2008    Current Outpatient Medications  Medication Sig Dispense Refill  . aspirin (ASPIRIN 81) 81 MG EC tablet Take 1 tablet (81 mg total) by mouth daily. Swallow whole. 30 tablet 1  . Azilsartan-Chlorthalidone (EDARBYCLOR) 40-25 MG TABS Take 1 tablet by mouth daily. 90 tablet 0  . Blood Glucose Monitoring Suppl (ONE TOUCH ULTRA MINI) w/Device KIT Use device to test blood sugars 1-4 times daily as instructed. 1 each 0  . Continuous Blood Gluc Receiver (FREESTYLE LIBRE 14 DAY READER) DEVI 1 applicator by Does not apply route daily. 1 Device 0  . Continuous Blood Gluc Sensor (FREESTYLE LIBRE 14 DAY SENSOR) MISC 2 applicators by Does not apply route daily. 2 each 11  . glipiZIDE (GLUCOTROL XL) 5 MG 24 hr tablet Take 5 mg by mouth daily with breakfast.     . glucose blood  test strip Use as instructed 100 each 12  . ibuprofen (ADVIL,MOTRIN) 800 MG tablet Take 800 mg by mouth every 8 (eight) hours as needed for moderate pain.    [place] Kitchen insulin degludec (TRESIBA FLEXTOUCH) 100 UNIT/ML SOPN FlexTouch Pen Inject 0.45 mLs (45 Units total) into the skin daily. 15 mL 3  . Insulin Pen Needle (BD PEN NEEDLE NANO U/F) 32G X 4 MM MISC USE ONCE DAILY AS DIRECTED. 100 each 0  . Lancets MISC Use one lancet per test to check blood sugar 1-4 times daily. Dx code:E11.9 100 each 5  . pioglitazone (ACTOS) 30 MG tablet Take 1 tablet (30 mg total) by mouth daily. 30 tablet 11  . potassium chloride SA (K-DUR,KLOR-CON) 20 MEQ tablet Take 1 tablet (20 mEq total) by mouth daily. 30 tablet 1  . TRULICITY 1.5 CB/4.4HQ SOPN Inject 1.5 mg as directed every Friday. 18 mL 1  . meloxicam (MOBIC) 7.5 MG tablet Take 1 tablet (7.5 mg total) by mouth daily. 30 tablet 0  . pregabalin (LYRICA) 75 MG capsule Take 1 capsule (75 mg total) by mouth 2 (two) times daily. (Patient not taking: Reported on 03/24/2018) 60 capsule 5  . pregabalin (LYRICA) 75 MG capsule Take 1 capsule (75 mg total) by mouth daily. (Patient not taking: Reported on 03/24/2018) 14 capsule 0   No current facility-administered medications for this visit.     Allergies: Lisinopril  Past Medical History:  Diagnosis Date  . Allergy    rhinitis  . Anemia   . Anxiety   . Diabetes mellitus without complication ([place])   .  Enlarged heart   . Hypertension   . Morbid obesity ([place])   . Shortness of breath dyspnea    with low iron    Past Surgical History:  Procedure Laterality Date  . ABDOMINAL HYSTERECTOMY N/A 01/29/2015   Procedure: TOTAL ABDOMINAL HYSTERECTOMY ;  Surgeon: Ena Dawley, MD;  Location: [facility];  Service: Gynecology;  Laterality: N/A;  . BILATERAL SALPINGECTOMY Bilateral 01/29/2015   Procedure: BILATERAL SALPINGECTOMY;  Surgeon: Ena Dawley, MD;  Location: [facility];  Service: Gynecology;  Laterality: Bilateral;  .  CESAREAN SECTION    . KNEE SURGERY  1992   ? arthroscopic/ right    Family History  Problem Relation Age of Onset  . Hypertension Mother   . Diabetes Mother   . Coronary artery disease Father   . Heart failure Father   . Heart attack Father   . Diabetes Father   . Hypertension Other   . Hyperlipidemia Other   . Cancer Paternal Grandmother        unknown type?  . Esophageal cancer Neg Hx   . Colon cancer Neg Hx   . Pancreatic cancer Neg Hx   . Stomach cancer Neg Hx     Social History   Tobacco Use  . Smoking status: Never Smoker  . Smokeless tobacco: Never Used  Substance Use Topics  . Alcohol use: No     Subjective:  Lindsay Gomez is here today for evaluation of lower back, right hip and bilateral leg pain. She is also asking to have her glucose checked, and refill of edarbyclor. She says she is out of glucose strips at home, knows it will be high as she has not been watching her diet since Christmas. She tells me that she has been having lower back pain, right hip and bilateral leg pain "for years", worse over past 6 months, describes as aching stiff pain in lower back and right hip and aching, heaviness to BLE. She says the pain is worse when standing, sitting or bending. The pain is better with stretching and lying down. She does not exercise regularly. She has not tried anything for the pain, she says she does not like to take medicine. Denies fevers, chills, syncope, bowel or bladder changes, skin discoloration, falls.  ROS- See HPI  Objective:  Vitals:   03/24/18 1503  BP: 128/76  Pulse: 85  SpO2: 97%  Weight: 293 lb (132.9 kg)  Height: 5' 2"  (1.575 m)    General: Well developed, well nourished, in no acute distress  Skin : Warm and dry.  Head: Normocephalic and atraumatic  Eyes: Sclera and conjunctiva clear; pupils round and reactive to light; extraocular movements intact  Oropharynx: Pink, supple. No suspicious lesions  Neck: Supple Lungs: Respirations unlabored;  clear to auscultation bilaterally  CVS exam: Normal rate and regular rhythm, S1 and S2 normal.  Musculoskeletal:     Right hip: She exhibits normal range of motion, normal strength, no tenderness and no deformity.     Right knee: She exhibits normal range of motion and no deformity.     Right ankle: She exhibits normal range of motion and no deformity.     Lumbar back: She exhibits tenderness. She exhibits no swelling, no edema and no deformity.  Extremities: No edema, cyanosis Vessels: Symmetric bilaterally  Neurologic: Alert and oriented; speech intact; face symmetrical; moves all extremities well; CNII-XII intact without focal deficit  Psychiatric: Normal mood and affect.   Assessment:  1. Type 2 diabetes mellitus with other  circulatory complication, without long-term current use of insulin ([place])   2. Essential hypertension   3. Chronic bilateral low back pain with bilateral sciatica     Plan:   No follow-ups on file.  Orders Placed This Encounter  Procedures  . DG Lumbar Spine Complete    Standing Status:   Future    Standing Expiration Date:   05/23/2019    Order Specific Question:   Reason for Exam (SYMPTOM  OR DIAGNOSIS REQUIRED)    Answer:   lower back pain    Order Specific Question:   Is patient pregnant?    Answer:   No    Order Specific Question:   Preferred imaging location?    Answer:   [facility]    Order Specific Question:   Radiology Contrast Protocol - do NOT remove file path    Answer:   [email or web address]  . POCT Glucose (CBG)    Requested Prescriptions   Signed Prescriptions Disp Refills  . aspirin (ASPIRIN 81) 81 MG EC tablet 30 tablet 1    Sig: Take 1 tablet (81 mg total) by mouth daily. Swallow whole.  . Azilsartan-Chlorthalidone (EDARBYCLOR) 40-25 MG TABS 90 tablet 0    Sig: Take 1 tablet by mouth daily.  . meloxicam (MOBIC) 7.5 MG tablet 30 tablet 0    Sig: Take 1 tablet (7.5 mg total) by mouth daily.

## 2018-03-24 NOTE — Telephone Encounter (Signed)
She never returned for repeat BMET to check potasssium after it was low on 09/13/17 She needs to stop by lab to have this lab drawn

## 2018-03-24 NOTE — Telephone Encounter (Signed)
Pt informed of below.  

## 2018-03-25 ENCOUNTER — Telehealth: Payer: Self-pay | Admitting: Internal Medicine

## 2018-03-25 DIAGNOSIS — M5442 Lumbago with sciatica, left side: Secondary | ICD-10-CM

## 2018-03-25 DIAGNOSIS — G8929 Other chronic pain: Secondary | ICD-10-CM | POA: Insufficient documentation

## 2018-03-25 DIAGNOSIS — M5441 Lumbago with sciatica, right side: Secondary | ICD-10-CM

## 2018-03-25 NOTE — Telephone Encounter (Signed)
Patient called requesting assistance in covering cost for the Free Style 14 day [employer].  Her insurance is not covering it and she wanted to find out if there were any coupons, assistance programs, etc.  Call back number is [phone]

## 2018-03-25 NOTE — Assessment & Plan Note (Signed)
Refill was sent  After visit today, labs reviewed and she never returned for BMET as instructed for low potassium on 09/13/17, we have called her with instructions to return for  repeat BMET - Azilsartan-Chlorthalidone (EDARBYCLOR) 40-25 MG TABS; Take 1 tablet by mouth daily.  Dispense: 90 tablet; Refill: 0

## 2018-03-25 NOTE — Assessment & Plan Note (Signed)
-   POCT Glucose (CBG)-319 History of noncompliance to diabetes regimen She is now being followed by endocrinology She was advised to keep endocrinology follow up as instructed

## 2018-03-25 NOTE — Assessment & Plan Note (Signed)
Update imaging today- will consider referral to sports medicine based on imaging today Short course of mobic given- medication dosing, side effects discussed including instructions not to combine with other NSAIDS Home management, back exercises, red flags and return precautions including when to seek immediate care discussed and printed on AVS - DG Lumbar Spine Complete; Future - meloxicam (MOBIC) 7.5 MG tablet; Take 1 tablet (7.5 mg total) by mouth daily.  Dispense: 30 tablet; Refill: 0

## 2018-03-28 NOTE — Telephone Encounter (Signed)
Called pt and informed her that there were no patient assistance available at this time.

## 2018-04-12 ENCOUNTER — Emergency Department (HOSPITAL_COMMUNITY)
Admission: EM | Admit: 2018-04-12 | Discharge: 2018-04-12 | Disposition: A | Discharge status: Home / Self Care | Payer: BC Managed Care – PPO | Attending: Emergency Medicine | Admitting: Emergency Medicine

## 2018-04-12 ENCOUNTER — Other Ambulatory Visit: Payer: Self-pay

## 2018-04-12 ENCOUNTER — Encounter (HOSPITAL_COMMUNITY): Payer: Self-pay

## 2018-04-12 ENCOUNTER — Emergency Department (HOSPITAL_COMMUNITY): Payer: BC Managed Care – PPO

## 2018-04-12 DIAGNOSIS — Z5321 Procedure and treatment not carried out due to patient leaving prior to being seen by health care provider: Secondary | ICD-10-CM | POA: Insufficient documentation

## 2018-04-12 DIAGNOSIS — R079 Chest pain, unspecified: Secondary | ICD-10-CM | POA: Diagnosis not present

## 2018-04-12 LAB — I-STAT BETA HCG BLOOD, ED (MC, WL, AP ONLY): I-stat hCG, quantitative: <5 m[IU]/mL (ref <5)

## 2018-04-12 LAB — BASIC METABOLIC PANEL
Anion gap: 11 (ref 5–15)
BUN: <5 mg/dL — ABNORMAL LOW (ref 6–20)
CO2: 28 mmol/L (ref 22–32)
Calcium: 9 mg/dL (ref 8.9–10.3)
Chloride: 96 mmol/L — ABNORMAL LOW (ref 98–111)
Creatinine, Ser: 0.62 mg/dL (ref 0.44–1.00)
GFR calc Af Amer: >60 mL/min (ref >60)
GFR calc non Af Amer: >60 mL/min (ref >60)
Glucose, Bld: 322 mg/dL — ABNORMAL HIGH (ref 70–99)
Potassium: 4.2 mmol/L (ref 3.5–5.1)
Sodium: 135 mmol/L (ref 135–145)

## 2018-04-12 LAB — I-STAT TROPONIN, ED: Troponin i, poc: 0.01 ng/mL (ref 0.00–0.08)

## 2018-04-12 LAB — CBC
HCT: 48 % — ABNORMAL HIGH (ref 36.0–46.0)
Hemoglobin: 14.8 g/dL (ref 12.0–15.0)
MCH: 26.6 pg (ref 26.0–34.0)
MCHC: 30.8 g/dL (ref 30.0–36.0)
MCV: 86.2 fL (ref 80.0–100.0)
Platelets: 247 10*3/uL (ref 150–400)
RBC: 5.57 MIL/uL — ABNORMAL HIGH (ref 3.87–5.11)
RDW: 13.4 % (ref 11.5–15.5)
WBC: 5.5 10*3/uL (ref 4.0–10.5)
nRBC: 0 % (ref 0.0–0.2)

## 2018-04-12 NOTE — ED Triage Notes (Signed)
Pt verbalized central non radiating chest pain that began this morning while getting ready for work. Pt has hx of cva with right side numbness, also has hx of anxiety and thinks this may be related to anxiety. Hypertensive and hx of same.

## 2018-05-09 ENCOUNTER — Ambulatory Visit: Payer: BC Managed Care – PPO | Admitting: Nurse Practitioner

## 2018-05-09 ENCOUNTER — Other Ambulatory Visit (INDEPENDENT_AMBULATORY_CARE_PROVIDER_SITE_OTHER): Payer: BC Managed Care – PPO

## 2018-05-09 ENCOUNTER — Other Ambulatory Visit: Payer: Self-pay

## 2018-05-09 ENCOUNTER — Encounter: Payer: Self-pay | Admitting: Nurse Practitioner

## 2018-05-09 VITALS — BP 160/100 | HR 98 | Temp 98.7°F | Ht 62.0 in | Wt 287.0 lb

## 2018-05-09 DIAGNOSIS — Z91199 Patient's noncompliance with other medical treatment and regimen due to unspecified reason: Secondary | ICD-10-CM

## 2018-05-09 DIAGNOSIS — R109 Unspecified abdominal pain: Secondary | ICD-10-CM | POA: Diagnosis not present

## 2018-05-09 DIAGNOSIS — I10 Essential (primary) hypertension: Secondary | ICD-10-CM | POA: Diagnosis not present

## 2018-05-09 DIAGNOSIS — E876 Hypokalemia: Secondary | ICD-10-CM | POA: Diagnosis not present

## 2018-05-09 DIAGNOSIS — Z9119 Patient's noncompliance with other medical treatment and regimen: Secondary | ICD-10-CM | POA: Diagnosis not present

## 2018-05-09 DIAGNOSIS — E1159 Type 2 diabetes mellitus with other circulatory complications: Secondary | ICD-10-CM

## 2018-05-09 LAB — CBC
HCT: 49.6 % — ABNORMAL HIGH (ref 36.0–46.0)
Hemoglobin: 16.5 g/dL — ABNORMAL HIGH (ref 12.0–15.0)
MCHC: 33.3 g/dL (ref 30.0–36.0)
MCV: 85.4 fl (ref 78.0–100.0)
Platelets: 283 10*3/uL (ref 150.0–400.0)
RBC: 5.81 Mil/uL — ABNORMAL HIGH (ref 3.87–5.11)
RDW: 13.9 % (ref 11.5–15.5)
WBC: 7.5 10*3/uL (ref 4.0–10.5)

## 2018-05-09 LAB — COMPREHENSIVE METABOLIC PANEL
ALT: 46 U/L — ABNORMAL HIGH (ref 0–35)
AST: 39 U/L — ABNORMAL HIGH (ref 0–37)
Albumin: 4.3 g/dL (ref 3.5–5.2)
Alkaline Phosphatase: 116 U/L (ref 39–117)
BUN: 9 mg/dL (ref 6–23)
CO2: 30 mEq/L (ref 19–32)
Calcium: 9.6 mg/dL (ref 8.4–10.5)
Chloride: 93 mEq/L — ABNORMAL LOW (ref 96–112)
Creatinine, Ser: 0.77 mg/dL (ref 0.40–1.20)
GFR: 97.78 mL/min (ref >60.00)
Glucose, Bld: 451 mg/dL — ABNORMAL HIGH (ref 70–99)
Potassium: 3.7 mEq/L (ref 3.5–5.1)
Sodium: 132 mEq/L — ABNORMAL LOW (ref 135–145)
Total Bilirubin: 0.4 mg/dL (ref 0.2–1.2)
Total Protein: 8.1 g/dL (ref 6.0–8.3)

## 2018-05-09 LAB — BASIC METABOLIC PANEL
BUN: 9 mg/dL (ref 6–23)
CO2: 30 mEq/L (ref 19–32)
Calcium: 9.6 mg/dL (ref 8.4–10.5)
Chloride: 93 mEq/L — ABNORMAL LOW (ref 96–112)
Creatinine, Ser: 0.77 mg/dL (ref 0.40–1.20)
GFR: 97.78 mL/min (ref >60.00)
Glucose, Bld: 451 mg/dL — ABNORMAL HIGH (ref 70–99)
Potassium: 3.7 mEq/L (ref 3.5–5.1)
Sodium: 132 mEq/L — ABNORMAL LOW (ref 135–145)

## 2018-05-09 NOTE — Patient Instructions (Addendum)
Head downstairs for lab work   You will be called about referral to GI  Please called endocrinology to schedule a follow up   Abdominal Pain, Adult  Many things can cause belly (abdominal) pain. Most times, belly pain is not dangerous. Many cases of belly pain can be watched and treated at home. Sometimes belly pain is serious, though. Your doctor will try to find the cause of your belly pain. Follow these instructions at home:  Take over-the-counter and prescription medicines only as told by your doctor. Do not take medicines that help you poop (laxatives) unless told to by your doctor.  Drink enough fluid to keep your pee (urine) clear or pale yellow.  Watch your belly pain for any changes.  Keep all follow-up visits as told by your doctor. This is important. Contact a doctor if:  Your belly pain changes or gets worse.  You are not hungry, or you lose weight without trying.  You are having trouble pooping (constipated) or have watery poop (diarrhea) for more than 2-3 days.  You have pain when you pee or poop.  Your belly pain wakes you up at night.  Your pain gets worse with meals, after eating, or with certain foods.  You are throwing up and cannot keep anything down.  You have a fever. Get help right away if:  Your pain does not go away as soon as your doctor says it should.  You cannot stop throwing up.  Your pain is only in areas of your belly, such as the right side or the left lower part of the belly.  You have bloody or black poop, or poop that looks like tar.  You have very bad pain, cramping, or bloating in your belly.  You have signs of not having enough fluid or water in your body (dehydration), such as: ? Dark pee, very little pee, or no pee. ? Cracked lips. ? Dry mouth. ? Sunken eyes. ? Sleepiness. ? Weakness. This information is not intended to replace advice given to you by your health care provider. Make sure you discuss any questions you have  with your health care provider. Document Released: 08/19/2007 Document Revised: 09/20/2015 Document Reviewed: 08/14/2015 Elsevier Interactive Patient Education  2019 [employer].

## 2018-05-09 NOTE — Progress Notes (Signed)
Lindsay Gomez is a 48 y.o. female with the following history as recorded in EpicCare:  Patient Active Problem List   Diagnosis Date Noted  . Chronic bilateral low back pain with bilateral sciatica 03/25/2018  . Hepatic steatosis 08/16/2017  . Thalamic stroke ([place]) 07/12/2017  . Cerebrovascular accident (CVA) ([place]) 03/14/2017  . Hypocalcemia 03/03/2017  . Cerebral thrombosis with cerebral infarction 03/03/2017  . Major depressive disorder, single episode, moderate ([place]) 10/28/2015  . Microcytic hypochromic anemia 07/29/2015  . Type 2 diabetes mellitus ([place]) 06/24/2015  . Depression 06/24/2015  . Obstructive sleep apnea 06/17/2015  . Anxiety and depression 04/24/2015  . Pre-op evaluation 01/09/2015  . Hyperlipidemia 01/06/2013  . Right lumbar radiculopathy 10/26/2012  . ANA positive 09/14/2012  . LVH (left ventricular hypertrophy) 12/08/2011  . Menorrhagia 09/24/2011  . Routine general medical examination at a health care facility 09/24/2011  . Carpal tunnel syndrome of right wrist 09/18/2010  . Hypokalemia 09/18/2010  . Morbid obesity ([place]) 11/29/2008  . Iron deficiency anemia 11/28/2008  . Essential hypertension 11/28/2008  . ALLERGIC RHINITIS 11/28/2008    Current Outpatient Medications  Medication Sig Dispense Refill  . aspirin (ASPIRIN 81) 81 MG EC tablet Take 1 tablet (81 mg total) by mouth daily. Swallow whole. 30 tablet 1  . Azilsartan-Chlorthalidone (EDARBYCLOR) 40-25 MG TABS Take 1 tablet by mouth daily. 90 tablet 0  . Blood Glucose Monitoring Suppl (ONE TOUCH ULTRA MINI) w/Device KIT Use device to test blood sugars 1-4 times daily as instructed. 1 each 0  . Continuous Blood Gluc Receiver (FREESTYLE LIBRE 14 DAY READER) DEVI 1 applicator by Does not apply route daily. 1 Device 0  . Continuous Blood Gluc Sensor (FREESTYLE LIBRE 14 DAY SENSOR) MISC 2 applicators by Does not apply route daily. 2 each 11  . glipiZIDE (GLUCOTROL XL) 5 MG 24 hr tablet Take 5 mg by mouth  daily with breakfast.     . glucose blood test strip Use as instructed 100 each 12  . ibuprofen (ADVIL,MOTRIN) 800 MG tablet Take 800 mg by mouth every 8 (eight) hours as needed for moderate pain.    Marland Kitchen insulin degludec (TRESIBA FLEXTOUCH) 100 UNIT/ML SOPN FlexTouch Pen Inject 0.45 mLs (45 Units total) into the skin daily. 15 mL 3  . Insulin Pen Needle (BD PEN NEEDLE NANO U/F) 32G X 4 MM MISC USE ONCE DAILY AS DIRECTED. 100 each 0  . Lancets MISC Use one lancet per test to check blood sugar 1-4 times daily. Dx code:E11.9 100 each 5  . meloxicam (MOBIC) 7.5 MG tablet Take 1 tablet (7.5 mg total) by mouth daily. 30 tablet 0  . pioglitazone (ACTOS) 30 MG tablet Take 1 tablet (30 mg total) by mouth daily. 30 tablet 11  . potassium chloride SA (K-DUR,KLOR-CON) 20 MEQ tablet Take 1 tablet (20 mEq total) by mouth daily. 30 tablet 1  . pregabalin (LYRICA) 75 MG capsule Take 1 capsule (75 mg total) by mouth 2 (two) times daily. 60 capsule 5  . pregabalin (LYRICA) 75 MG capsule Take 1 capsule (75 mg total) by mouth daily. 14 capsule 0  . TRULICITY 1.5 KW/4.0XB SOPN Inject 1.5 mg as directed every Friday. 18 mL 1   No current facility-administered medications for this visit.     Allergies: Lisinopril  Past Medical History:  Diagnosis Date  . Allergy    rhinitis  . Anemia   . Anxiety   . Diabetes mellitus without complication ([place])   . Enlarged heart   .  Hypertension   . Morbid obesity ([place])   . Shortness of breath dyspnea    with low iron    Past Surgical History:  Procedure Laterality Date  . ABDOMINAL HYSTERECTOMY N/A 01/29/2015   Procedure: TOTAL ABDOMINAL HYSTERECTOMY ;  Surgeon: Ena Dawley, MD;  Location: [facility];  Service: Gynecology;  Laterality: N/A;  . BILATERAL SALPINGECTOMY Bilateral 01/29/2015   Procedure: BILATERAL SALPINGECTOMY;  Surgeon: Ena Dawley, MD;  Location: [facility];  Service: Gynecology;  Laterality: Bilateral;  . CESAREAN SECTION    . KNEE SURGERY  1992   ?  arthroscopic/ right    Family History  Problem Relation Age of Onset  . Hypertension Mother   . Diabetes Mother   . Coronary artery disease Father   . Heart failure Father   . Heart attack Father   . Diabetes Father   . Hypertension Other   . Hyperlipidemia Other   . Cancer Paternal Grandmother        unknown type?  . Esophageal cancer Neg Hx   . Colon cancer Neg Hx   . Pancreatic cancer Neg Hx   . Stomach cancer Neg Hx     Social History   Tobacco Use  . Smoking status: Never Smoker  . Smokeless tobacco: Never Used  Substance Use Topics  . Alcohol use: No     Subjective:  Lindsay Gomez is here today for acute visit, CC: abdominal pain, diarrhea, constipation x 3 weeks She reports alternating diarrhea, constipation- about 2-3 soft stools per day, until she takes pepto then becomes constipated, which has been occurring for 3 weeks now Describes abd pain as mild lower abd pressure No fevers, chills, weakness, syncope, nausea, urinary changes, vag discharge or bleeding. Of note, her diabetes has been poorly controlled, at our last OV on 1/9 she had started following with endocrinology but tells me today she never went back and is not checking sugars now. She also had Ct ABD, HIDA done last year by me for abd pain, n/v, was referred to GI but never scheduled follow up Her BP is elevated today, unsure if she took her meds, says she is  feeling stressed from work right now, she just got off, and thinks that is why her BP is up.  ROS- See HPI  Objective:  Vitals:   05/09/18 1514 05/09/18 1543  BP: (!) 178/90 (!) 160/100  Pulse: 98   Temp: 98.7 F (37.1 C)   TempSrc: Oral   SpO2: 96%   Weight: 287 lb (130.2 kg)   Height: _0  (1.575 m)     General: Well developed, well nourished, in no acute distress  Skin : Warm and dry.  Head: Normocephalic and atraumatic  Eyes: Sclera and conjunctiva clear; pupils round and reactive to light; extraocular movements intact  Oropharynx:  Pink, supple. No suspicious lesions.  Neck: Supple. Lungs: Respirations unlabored; clear to auscultation bilaterally without wheeze, rales, rhonchi  CVS exam: normal rate and regular rhythm, S1 and S2 normal.  Abdomen: Soft; rotund; normoactive bowel sounds; no masses or hepatosplenomegaly; no rigidity or guarding, mild generalized tenderness Extremities: No edema, cyanosis, clubbing  Vessels: Symmetric bilaterally  Neurologic: Alert and oriented; speech intact; face symmetrical; moves all extremities well; CNII-XII intact without focal deficit  Psychiatric: Normal mood and affect.  Assessment:  1. Abdominal pain, unspecified abdominal location   2. Essential hypertension   3. Type 2 diabetes mellitus with other circulatory complication, without long-term current use of insulin ([place])   4.  Non-compliance     Plan:   Abdominal pain, unspecified abdominal location She never followed up on GI referral this past summer. We discussed need for further evaluation of continued abdominal complaints by GI, she was agreeable to this plan Home management, red flags and return precautions including when to seek immediate/emergency care discussed and printed on AVS Update labs - CBC; Future - Comprehensive metabolic panel; Future - Ambulatory referral to Gastroenterology  No follow-ups on file.  Orders Placed This Encounter  Procedures  . CBC    Standing Status:   Future    Number of Occurrences:   1    Standing Expiration Date:   05/10/2019  . Comprehensive metabolic panel    Standing Status:   Future    Number of Occurrences:   1    Standing Expiration Date:   05/10/2019  . Ambulatory referral to Gastroenterology    Referral Priority:   Routine    Referral Type:   Consultation    Referral Reason:   Specialty Services Required    Number of Visits Requested:   1    Requested Prescriptions    No prescriptions requested or ordered in this encounter

## 2018-05-13 ENCOUNTER — Encounter: Payer: Self-pay | Admitting: Nurse Practitioner

## 2018-05-13 DIAGNOSIS — Z9119 Patient's noncompliance with other medical treatment and regimen: Secondary | ICD-10-CM | POA: Insufficient documentation

## 2018-05-13 DIAGNOSIS — Z91199 Patient's noncompliance with other medical treatment and regimen due to unspecified reason: Secondary | ICD-10-CM | POA: Insufficient documentation

## 2018-05-13 NOTE — Assessment & Plan Note (Signed)
Uncontrolled Has not been following up with endocrinology as instructed I am worried that GI symptoms could be related to uncontrolled diabetes which we discussed She was instructed to call endocrinology to schedule a follow up visit and she seems agreeable to this plan

## 2018-05-13 NOTE — Assessment & Plan Note (Signed)
BP elevated today, unsure of medication compliance Instructed to resume medications daily as prescribed

## 2018-05-13 NOTE — Assessment & Plan Note (Signed)
Again today, she has not followed up on referrals, some med refills are not up to date, at this point I believe her biggest issue is uncontrolled diabetes, which could be causing many of her other problems, we had a frank discussion abut this today and she seems understanding of this, she was encouraged to work on diabetes control.Marland KitchenMarland KitchenI am trying my best to help her under the given circumstances

## 2018-05-17 ENCOUNTER — Telehealth: Payer: Self-pay | Admitting: *Deleted

## 2018-05-17 NOTE — Telephone Encounter (Signed)
I called pt to advise of below per PCP and based on recent labs. 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 I advised her to contact Dr. Quin Hoop office because she prescribed this and her office may know of better financial assistance or resources for patient's sensors. Patient hung up on me or call was dropped.   Notes recorded by Cresenciano Lick, CMA on 05/17/2018 at 2:51 PM EST Pt informed of below. -- Notes recorded by Lance Sell, NP on 05/16/2018 at 1:16 PM EST Your labs are really concerning You are doing a lot of damage to your body It is time to start taking your health seriously Please keep your planned follow up with GI and endocrinology to help [place] with your diabetes and GI symptoms

## 2018-05-20 ENCOUNTER — Telehealth: Payer: Self-pay | Admitting: Gastroenterology

## 2018-05-20 NOTE — Telephone Encounter (Signed)
Pt is scheduled for 04/1 for office visit, wants to speak to nurse regarding abd pain states that Caesar Chestnut, NP "keeps send ding her to specialist and they can' figure out what;s wrong" Wants to know what to expect on this appt.  (advised pt that Dr Fuller Plan will need to evaluate and then they'll go from there) Pt still wanted to speak to nurse.

## 2018-05-20 NOTE — Telephone Encounter (Signed)
Patient advised that she will be evaluated by Dr. Fuller Plan and he will determine his plan of care at the appt time.  Patient has not been evaluated for this problem.

## 2018-06-02 ENCOUNTER — Other Ambulatory Visit: Payer: Self-pay

## 2018-06-02 ENCOUNTER — Encounter: Payer: Self-pay | Admitting: Nurse Practitioner

## 2018-06-02 ENCOUNTER — Ambulatory Visit: Payer: BC Managed Care – PPO | Admitting: Nurse Practitioner

## 2018-06-02 VITALS — BP 154/100 | HR 74 | Temp 98.4°F | Ht 62.0 in | Wt 288.0 lb

## 2018-06-02 DIAGNOSIS — Z91199 Patient's noncompliance with other medical treatment and regimen due to unspecified reason: Secondary | ICD-10-CM

## 2018-06-02 DIAGNOSIS — R109 Unspecified abdominal pain: Secondary | ICD-10-CM | POA: Diagnosis not present

## 2018-06-02 DIAGNOSIS — E1159 Type 2 diabetes mellitus with other circulatory complications: Secondary | ICD-10-CM

## 2018-06-02 DIAGNOSIS — F329 Major depressive disorder, single episode, unspecified: Secondary | ICD-10-CM

## 2018-06-02 DIAGNOSIS — F419 Anxiety disorder, unspecified: Secondary | ICD-10-CM

## 2018-06-02 DIAGNOSIS — Z9119 Patient's noncompliance with other medical treatment and regimen: Secondary | ICD-10-CM

## 2018-06-02 DIAGNOSIS — I10 Essential (primary) hypertension: Secondary | ICD-10-CM | POA: Diagnosis not present

## 2018-06-02 DIAGNOSIS — F32A Depression, unspecified: Secondary | ICD-10-CM

## 2018-06-02 LAB — GLUCOSE, POCT (MANUAL RESULT ENTRY): POC Glucose: 282 mg/dl — AB (ref 70–99)

## 2018-06-02 NOTE — Patient Instructions (Addendum)
I have provided your note as requested  Please keep planned follow up with psychiatry, endocrinology and gastroenterology.

## 2018-06-02 NOTE — Progress Notes (Signed)
Lindsay Gomez is a 48 y.o. female with the following history as recorded in EpicCare:  Patient Active Problem List   Diagnosis Date Noted  . Non-compliance 05/13/2018  . Chronic bilateral low back pain with bilateral sciatica 03/25/2018  . Hepatic steatosis 08/16/2017  . Thalamic stroke (Lindsay Gomez) 07/12/2017  . Cerebrovascular accident (CVA) (Lindsay Gomez) 03/14/2017  . Hypocalcemia 03/03/2017  . Cerebral thrombosis with cerebral infarction 03/03/2017  . Major depressive disorder, single episode, moderate (Lindsay Gomez) 10/28/2015  . Microcytic hypochromic anemia 07/29/2015  . Type 2 diabetes mellitus (Lindsay Gomez) 06/24/2015  . Depression 06/24/2015  . Obstructive sleep apnea 06/17/2015  . Anxiety and depression 04/24/2015  . Pre-op evaluation 01/09/2015  . Hyperlipidemia 01/06/2013  . Right lumbar radiculopathy 10/26/2012  . ANA positive 09/14/2012  . LVH (left ventricular hypertrophy) 12/08/2011  . Menorrhagia 09/24/2011  . Routine general medical examination at a health care facility 09/24/2011  . Carpal tunnel syndrome of right wrist 09/18/2010  . Hypokalemia 09/18/2010  . Morbid obesity (Lindsay Gomez) 11/29/2008  . Iron deficiency anemia 11/28/2008  . Essential hypertension 11/28/2008  . ALLERGIC RHINITIS 11/28/2008    Current Outpatient Medications  Medication Sig Dispense Refill  . aspirin (ASPIRIN 81) 81 MG EC tablet Take 1 tablet (81 mg total) by mouth daily. Swallow whole. 30 tablet 1  . Azilsartan-Chlorthalidone (EDARBYCLOR) 40-25 MG TABS Take 1 tablet by mouth daily. 90 tablet 0  . Blood Glucose Monitoring Suppl (ONE TOUCH ULTRA MINI) w/Device KIT Use device to test blood sugars 1-4 times daily as instructed. 1 each 0  . Continuous Blood Gluc Receiver (FREESTYLE LIBRE 14 DAY READER) DEVI 1 applicator by Does not apply route daily. 1 Device 0  . Continuous Blood Gluc Sensor (FREESTYLE LIBRE 14 DAY SENSOR) MISC 2 applicators by Does not apply route daily. 2 each 11  . glipiZIDE (GLUCOTROL XL) 5 MG 24  hr tablet Take 5 mg by mouth daily with breakfast.     . glucose blood test strip Use as instructed 100 each 12  . ibuprofen (ADVIL,MOTRIN) 800 MG tablet Take 800 mg by mouth every 8 (eight) hours as needed for moderate pain.    Marland Kitchen insulin degludec (TRESIBA FLEXTOUCH) 100 UNIT/ML SOPN FlexTouch Pen Inject 0.45 mLs (45 Units total) into the skin daily. 15 mL 3  . Insulin Pen Needle (BD PEN NEEDLE NANO U/F) 32G X 4 MM MISC USE ONCE DAILY AS DIRECTED. 100 each 0  . Lancets MISC Use one lancet per test to check blood sugar 1-4 times daily. Dx code:E11.9 100 each 5  . meloxicam (MOBIC) 7.5 MG tablet Take 1 tablet (7.5 mg total) by mouth daily. 30 tablet 0  . pioglitazone (ACTOS) 30 MG tablet Take 1 tablet (30 mg total) by mouth daily. 30 tablet 11  . potassium chloride SA (K-DUR,KLOR-CON) 20 MEQ tablet Take 1 tablet (20 mEq total) by mouth daily. 30 tablet 1  . pregabalin (LYRICA) 75 MG capsule Take 1 capsule (75 mg total) by mouth 2 (two) times daily. 60 capsule 5  . pregabalin (LYRICA) 75 MG capsule Take 1 capsule (75 mg total) by mouth daily. 14 capsule 0  . TRULICITY 1.5 JJ/8.8CZ SOPN Inject 1.5 mg as directed every Friday. 18 mL 1   No current facility-administered medications for this visit.     Allergies: Lisinopril  Past Medical History:  Diagnosis Date  . Allergy    rhinitis  . Anemia   . Anxiety   . Diabetes mellitus without complication (Lindsay Gomez)   . Enlarged  heart   . Hypertension   . Morbid obesity (Lindsay Gomez)   . Shortness of breath dyspnea    with low iron    Past Surgical History:  Procedure Laterality Date  . ABDOMINAL HYSTERECTOMY N/A 01/29/2015   Procedure: TOTAL ABDOMINAL HYSTERECTOMY ;  Surgeon: Ena Dawley, MD;  Location[facility];  Service: Gynecology;  Laterality: N/A;  . BILATERAL SALPINGECTOMY Bilateral 01/29/2015   Procedure: BILATERAL SALPINGECTOMY;  Surgeon: Ena Dawley, MD;  Location: [facility];  Service: Gynecology;  Laterality: Bilateral;  . CESAREAN SECTION     . KNEE SURGERY  1992   ? arthroscopic/ right    Family History  Problem Relation Age of Onset  . Hypertension Mother   . Diabetes Mother   . Coronary artery disease Father   . Heart failure Father   . Heart attack Father   . Diabetes Father   . Hypertension Other   . Hyperlipidemia Other   . Cancer Paternal Grandmother        unknown type?  . Esophageal cancer Neg Hx   . Colon cancer Neg Hx   . Pancreatic cancer Neg Hx   . Stomach cancer Neg Hx     Social History   Tobacco Use  . Smoking status: Never Smoker  . Smokeless tobacco: Never Used  Substance Use Topics  . Alcohol use: No     Subjective:  [name] is here today requesting work note, she tells me that due to [place] pandemic, her job has transitioned her to online work, sitting on computer from [phone] 5 days a week. This is causing her to have severe anxiety, as well as making her eyes hurt. I had referred her to psychiatry in the past for uncontrolled anxiety, but she never scheduled the appointment, however tells me now that she plans to schedule this appointment due to increase in her anxiety with recent pandemic, just wants to be out of work for next week while getting in with psychiatry. She also wonders if klonopin would help her anxiety. At her last OV with me on 05/09/18, she was referred to GI for abdominal pain, and instructed to follow back up with endocrinology for uncontrolled diabetes, which seems to be the source of many of her problems, tells me today that she has scheduled upcoming appointments with GI for abdominal pain and endocrinology for diabetes, although I see no upcoming endo appointment in the system. She has also been trying to walk more and watch what she eats. She is requesting CBG today because she still doesn't have a meter at home. Again, her BP is elevated today, unsure if she took her meds.  ROS- See HPI  Objective:  Vitals:   06/02/18 1513  BP: (!) 154/100  Pulse: 74  Temp: 98.4 F  (36.9 C)  TempSrc: Oral  SpO2: 97%  Weight: 288 lb (130.6 kg)  Height: 5' 2"  (1.575 m)    General: Well developed, well nourished, in no acute distress  Skin : Warm and dry.  Head: Normocephalic and atraumatic  Eyes: Sclera and conjunctiva clear; pupils round and reactive to light; extraocular movements intact  Oropharynx: Pink, supple. No suspicious lesions  Neck: Supple Lungs: Effort unlabored, no respiratory distress CVS exam: normal rate and regular rhythm.  Extremities: No edema, cyanosis, clubbing  Vessels: Symmetric bilaterally  Neurologic: Alert and oriented; speech intact; face symmetrical; moves all extremities well; CNII-XII intact without focal deficit  Psychiatric: Normal mood and affect.  Assessment:  1. Anxiety and depression  2. Type 2 diabetes mellitus with other circulatory complication, without long-term current use of insulin ([place])     Plan:   Abdominal pain, unspecified abdominal location continue with planned GI follow up  No follow-ups on file.  Orders Placed This Encounter  Procedures  . POCT glucose (manual entry)    Requested Prescriptions    No prescriptions requested or ordered in this encounter

## 2018-06-02 NOTE — Assessment & Plan Note (Addendum)
We discussed that klonopin is not good choice for treatment of daily anxiety She says she will call to schedule psychiatry appointment now Work note provided at her request  She had requested FMLA in the past, but decided not to pursue FMLA

## 2018-06-02 NOTE — Assessment & Plan Note (Signed)
Glucose remains elevated but improved since last BMET Another discussion today about the importance of getting her diabetes under control Keep planned F/U with endocrinology - POCT glucose (manual entry)-282

## 2018-06-02 NOTE — Assessment & Plan Note (Signed)
Continues to be an issue in her care

## 2018-06-02 NOTE — Assessment & Plan Note (Signed)
BP remains elevated, unsure of medication compliance Instructed to resume medications daily as prescribed

## 2018-06-10 ENCOUNTER — Telehealth: Payer: Self-pay

## 2018-06-10 ENCOUNTER — Other Ambulatory Visit: Payer: Self-pay | Admitting: Nurse Practitioner

## 2018-06-10 NOTE — Telephone Encounter (Addendum)
Left message for patient to reschedule to 06/15/18 at 1:30pm

## 2018-06-13 NOTE — Telephone Encounter (Signed)
Called patient again and informed patient we would like to move her appt time up. Patient states she actually needs a later appt since she is working from home. Pt reschedule to 3:00pm on 06/15/18.

## 2018-06-14 ENCOUNTER — Telehealth: Payer: Self-pay

## 2018-06-14 NOTE — Telephone Encounter (Signed)
Covid-19 travel screening questions  Have you traveled in the last 14 days? no If yes where?  Do you now or have you had a fever in the last 14 days?no  Do you have any respiratory symptoms of shortness of breath or cough now or in the last 14 days? no   Do you have any family members or close contacts with diagnosed or suspected Covid-19? No  Patient asked to call the office if any of this changes prior to the office visit tomorrow.

## 2018-06-15 ENCOUNTER — Encounter: Payer: Self-pay | Admitting: Gastroenterology

## 2018-06-15 ENCOUNTER — Ambulatory Visit (INDEPENDENT_AMBULATORY_CARE_PROVIDER_SITE_OTHER): Payer: BC Managed Care – PPO | Admitting: Gastroenterology

## 2018-06-15 ENCOUNTER — Ambulatory Visit: Payer: Self-pay | Admitting: Gastroenterology

## 2018-06-15 ENCOUNTER — Telehealth: Payer: Self-pay

## 2018-06-15 ENCOUNTER — Other Ambulatory Visit (INDEPENDENT_AMBULATORY_CARE_PROVIDER_SITE_OTHER): Payer: BC Managed Care – PPO

## 2018-06-15 VITALS — BP 158/84 | HR 82 | Temp 98.5°F | Ht 62.0 in | Wt 284.0 lb

## 2018-06-15 DIAGNOSIS — E739 Lactose intolerance, unspecified: Secondary | ICD-10-CM

## 2018-06-15 DIAGNOSIS — R197 Diarrhea, unspecified: Secondary | ICD-10-CM

## 2018-06-15 DIAGNOSIS — R1084 Generalized abdominal pain: Secondary | ICD-10-CM

## 2018-06-15 LAB — TSH: TSH: 0.87 u[IU]/mL (ref 0.35–4.50)

## 2018-06-15 LAB — IGA: IgA: 267 mg/dL (ref 68–378)

## 2018-06-15 MED ORDER — DICYCLOMINE HCL 10 MG PO CAPS: 10.0000 mg | ORAL_CAPSULE | Freq: Three times a day (TID) | ORAL | 11 refills | Status: DC

## 2018-06-15 NOTE — Patient Instructions (Signed)
Your provider has requested that you go to the basement level for lab work before leaving today. Press "B" on the elevator. The lab is located at the first door on the left as you exit the elevator.  We have sent the following medications to your pharmacy for you to pick up at your convenience: dicyclomine.   You can take over the counter Gas-x four times a day for gas and bloating.   We will put you on a list to be contacted to schedule a Colonoscopy for June after covid-19 restrictions.   Call back if your symptoms have not improved.   Thank you for choosing me and [facility].  Pricilla Riffle. Dagoberto Ligas., MD., [facility] Regal

## 2018-06-15 NOTE — Progress Notes (Signed)
0   History of Present Illness: This is a 48 year old female with intermittent generalized abdominal pain, diarrhea for 2 months.  She relates of problems with intermittent constipation for several years.  Over the past 2 months her bowel pattern has changed to be predominantly urgent looser stools generally occurring after meals associated with generalized abdominal pain and gas.  The pain often lasts 1 to 2 hours.  Her diabetes has been difficult to manage.  She had a recent blood glucose 451 and AST=39, ALT=46.  She was started on Tresiba about the time her symptoms began.  She takes meloxicam daily and ibuprofen as needed.  She denies any substantial diet changes travel or antibiotic usage within the 1 to 2 months proceeding the onset of her abdominal pain and bowel habit changes.  She states she has had long-term problems with lactose intolerance so she avoids lactose.  She also has noted that certain diet drinks exacerbate her diarrhea. Denies weight loss, abdominal pain, constipation, diarrhea, change in stool caliber, melena, hematochezia, nausea, vomiting, dysphagia, reflux symptoms, chest pain.   Current Medications, Allergies, Past Medical History, Past Surgical History, Family History and Social History were reviewed in [employer] record.  Physical Exam: General: Well developed, well nourished, no acute distress Head: Normocephalic and atraumatic Eyes:  sclerae anicteric, EOMI Ears: Normal auditory acuity Mouth: No deformity or lesions Lungs: Clear throughout to auscultation Heart: Regular rate and rhythm; no murmurs, rubs or bruits Abdomen: Soft, non tender and non distended. No masses, hepatosplenomegaly or hernias noted. Normal Bowel sounds Rectal: Not done Musculoskeletal: Symmetrical with no gross deformities  Pulses:  Normal pulses noted Extremities: No clubbing, cyanosis, edema or deformities noted Neurological: Alert oriented x 4, grossly nonfocal  Psychological:  Alert and cooperative. Normal mood and affect   Assessment and Recommendations:  1.  Intermittent generalized abdominal pain associated with a change in bowel habits, primarily urgent postprandial loose stools.  Rule out infectious etiologies, medication side effects, IBD, microscopic colitis, colorectal neoplasms, IBS.  GI pathogen panel, fecal elastase, tTG, IgA.  Dicyclomine 10 mg p.o. 3 times daily AC.  Gas-X 4 times daily, before meals and at bedtime.  If these measures are not effective then increase dicyclomine to 20 mg and begin a trial of a low FODMAP diet.  Schedule colonoscopy.  The risks (including bleeding, perforation, infection, missed lesions, medication reactions and possible hospitalization or surgery if complications occur), benefits, and alternatives to colonoscopy with possible biopsy and possible polypectomy were discussed with the patient and they consent to proceed. Given COVID-19 restrictions will schedule about 2 months out however if her symptoms do not improve consider earlier colonoscopy.  2.  Lactose intolerance.  Continue to avoid lactose products.  3.  Hepatic steatosis with minimally elevated transaminases.  Long-term fat modified, carb modified, weight loss diet supervised by her PCP.  Optimal management of diabetes mellitus.  4.  DM, not under adequate control.   5.  Morbid obesity, BMI=51.94.

## 2018-06-16 LAB — TISSUE TRANSGLUTAMINASE, IGA: (tTG) Ab, IgA: 1 U/mL

## 2018-07-11 ENCOUNTER — Telehealth: Payer: Self-pay

## 2018-07-11 NOTE — Telephone Encounter (Signed)
Error

## 2018-07-11 NOTE — Telephone Encounter (Signed)
Called patient to check on her symptoms and if she wanted to go ahead and schedule her Colonoscopy now instead of June. Patient states her symptoms have definitely improved as long as she watched her blood sugar levels and what she eats. Patient states she will call back if her symptoms do not continue to improve or she wants to schedule the procedure sooner.

## 2018-07-12 ENCOUNTER — Encounter: Payer: Self-pay | Admitting: Internal Medicine

## 2018-07-12 ENCOUNTER — Ambulatory Visit (INDEPENDENT_AMBULATORY_CARE_PROVIDER_SITE_OTHER): Payer: BC Managed Care – PPO | Admitting: Internal Medicine

## 2018-07-12 DIAGNOSIS — E1165 Type 2 diabetes mellitus with hyperglycemia: Secondary | ICD-10-CM | POA: Diagnosis not present

## 2018-07-12 DIAGNOSIS — Z794 Long term (current) use of insulin: Secondary | ICD-10-CM | POA: Diagnosis not present

## 2018-07-12 DIAGNOSIS — E1142 Type 2 diabetes mellitus with diabetic polyneuropathy: Secondary | ICD-10-CM | POA: Diagnosis not present

## 2018-07-12 DIAGNOSIS — E1159 Type 2 diabetes mellitus with other circulatory complications: Secondary | ICD-10-CM

## 2018-07-12 MED ORDER — PIOGLITAZONE HCL 30 MG PO TABS: 30.0000 mg | ORAL_TABLET | Freq: Every day | ORAL | 3 refills | Status: DC

## 2018-07-12 MED ORDER — INSULIN DEGLUDEC 100 UNIT/ML ~~LOC~~ SOPN: 52.0000 [IU] | PEN_INJECTOR | Freq: Every day | SUBCUTANEOUS | 3 refills | Status: DC

## 2018-07-12 MED ORDER — TRULICITY 1.5 MG/0.5ML ~~LOC~~ SOAJ: 1.5000 mg | SUBCUTANEOUS | 3 refills | Status: DC

## 2018-07-12 NOTE — Progress Notes (Signed)
Virtual Visit via Video Note  I connected with Lindsay Gomez on 07/12/18 at 11:10 am  by a video enabled telemedicine application and verified that I am speaking with the correct person using two identifiers.   I discussed the limitations of evaluation and management by telemedicine and the availability of in person appointments. The patient expressed understanding and agreed to proceed.  -Location of the patient : Home -Location of the provider : office -The names of all persons participating in the telemedicine service : Lindsay Gomez and myself        Name: Lindsay Gomez  Age/ Sex: 48 y.o., female   MRN/ DOB: [id number], 1970-05-18     PCP: Lance Sell, NP   Reason for Endocrinology Evaluation: Type 2 Diabetes Mellitus  Initial Endocrine Consultative Visit: 01/07/18    PATIENT IDENTIFIER: Lindsay Gomez is a 48 y.o. female with a past medical history of HTN, Obesity,CVA, OSA not on treatment and T2DM . The patient has followed with Endocrinology clinic since 01/07/18 for consultative assistance with management of her diabetes.  DIABETIC HISTORY:  Lindsay Gomez was diagnosed with T2DM in 2016,She is intolerant to Metformin.On her initial visit to our clinic she was on Basaglar, glipizide and Trulicity, she did admit to noncompliance with her meds, she would take basaglar once a month. Her hemoglobin A1c has ranged from  6.3% in 2014, peaking at 14.1% in 2018  Lindsay Gomez with hx of recurrent yeast infections Lives with boyfriend who has T1DM and on dialysis  SUBJECTIVE:   During the last visit (02/14/2018): We continued  Actos 30 mg daily, Tresiba at 45 units, continued Trulicity .  Today (07/12/2018): Lindsay Gomez is here for a 4 month followup on diabetes management.  She checks her blood sugars multiple times daily through the [employer].The patient has not had hypoglycemic episodes since the last clinic visit. Otherwise, the patient has not required any recent emergency  interventions for hypoglycemia and has not  had recent hospitalizations secondary to hyper or hypoglycemic episodes.   Lindsay Gomez believes tresiba is not working for her. She ran out of Trulicity ~ 1 week ago. She has not been taking actos and does not recall the last time she took it.   In review of her chart, she has been evaluated by GI for diarrhea and abdominal pain.      ROS: As per HPI and as detailed below: Review of Systems  Constitutional: Positive for malaise/fatigue. Negative for weight loss.  Respiratory: Negative for cough and shortness of breath.   Cardiovascular: Negative for chest pain and palpitations.  Gastrointestinal:  Negative for nausea.      HOME DIABETES REGIMEN:  Pioglitazone 30 mg daily- not taking  Trulicity at 1.5 mg weekly - not taking Tresiba 66 units daily       GLUCOSE LOG :  Range 298-432 mg/dL    DIABETIC COMPLICATIONS: Microvascular complications:   Neuropathy  Denies:  Retinopathy, nephropathy  Last eye exam: Completed 2019  Macrovascular complications:   CVA (23/5573)  Denies: CAD, PVD       HISTORY:  Past Medical History:  Past Medical History:  Diagnosis Date  . Allergy    rhinitis  . Anemia   . Anxiety   . Diabetes mellitus without complication ([place])   . Enlarged heart   . Hypertension   . Morbid obesity ([place])   . Shortness of breath dyspnea    with low iron   Past Surgical History:  Past Surgical  History:  Procedure Laterality Date  . ABDOMINAL HYSTERECTOMY N/A 01/29/2015   Procedure: TOTAL ABDOMINAL HYSTERECTOMY ;  Surgeon: Ena Dawley, MD;  Location: [facility];  Service: Gynecology;  Laterality: N/A;  . BILATERAL SALPINGECTOMY Bilateral 01/29/2015   Procedure: BILATERAL SALPINGECTOMY;  Surgeon: Ena Dawley, MD;  Location: [facility];  Service: Gynecology;  Laterality: Bilateral;  . CESAREAN SECTION    . KNEE SURGERY  1992   ? arthroscopic/ right    Social History:  reports that she has never smoked.  She has never used smokeless tobacco. She reports that she does not drink alcohol or use drugs. Family History:  Family History  Problem Relation Age of Onset  . Hypertension Mother   . Diabetes Mother   . Coronary artery disease Father   . Heart failure Father   . Heart attack Father   . Diabetes Father   . Hypertension Other   . Hyperlipidemia Other   . Cancer Paternal Grandmother        unknown type?  . Esophageal cancer Neg Hx   . Colon cancer Neg Hx   . Pancreatic cancer Neg Hx   . Stomach cancer Neg Hx      HOME MEDICATIONS: Allergies as of 07/12/2018      Reactions   Lisinopril Other (See Comments)   Cough       Medication List       Accurate as of July 12, 2018 12:05 PM. Always use your most recent med list.        aspirin 81 MG EC tablet Commonly known as:  Aspirin 81 Take 1 tablet (81 mg total) by mouth daily. Swallow whole.   Azilsartan-Chlorthalidone 40-25 MG Tabs Commonly known as:  Edarbyclor Take 1 tablet by mouth daily.   dicyclomine 10 MG capsule Commonly known as:  BENTYL Take 1 capsule (10 mg total) by mouth 3 (three) times daily before meals.   FreeStyle Libre 14 Day Reader Kerrin Mo 1 applicator by Does not apply route daily.   FreeStyle Libre 14 Day Sensor Misc 2 applicators by Does not apply route daily.   glucose blood test strip Use as instructed   ibuprofen 800 MG tablet Commonly known as:  ADVIL Take 800 mg by mouth every 8 (eight) hours as needed for moderate pain.   insulin degludec 100 UNIT/ML Sopn FlexTouch Pen Commonly known as:  Tyler Aas FlexTouch Inject 0.52 mLs (52 Units total) into the skin daily.   Insulin Pen Needle 32G X 4 MM Misc Commonly known as:  BD Pen Needle Nano U/F USE ONCE DAILY AS DIRECTED.   Lancets Misc Use one lancet per test to check blood sugar 1-4 times daily. Dx code:E11.9   meloxicam 7.5 MG tablet Commonly known as:  MOBIC Take 1 tablet (7.5 mg total) by mouth daily.   ONE TOUCH ULTRA MINI  w/Device Kit Use device to test blood sugars 1-4 times daily as instructed.   pioglitazone 30 MG tablet Commonly known as:  Actos Take 1 tablet (30 mg total) by mouth daily.   potassium chloride SA 20 MEQ tablet Commonly known as:  K-DUR Take 1 tablet (20 mEq total) by mouth daily.   pregabalin 75 MG capsule Commonly known as:  Lyrica Take 1 capsule (75 mg total) by mouth daily.   Trulicity 1.5 PY/0.9XI Sopn Generic drug:  Dulaglutide Inject 1.5 mg as directed once a week.       ASSESSMENT / PLAN / RECOMMENDATIONS:   1) Type 2 Diabetes Mellitus, poorly  controlled, With neuropathic and  macrovascular complications - Most recent A1c of 11.6 %. Goal A1c < 7.0 %.    Plan:  - Lindsay Gomez continues with medication non-adherence. She does not recall the last time she took actos. She claims running out of trulicity ~ week ago. She is under the impression tresiba is not working despite increasing the dose on her own from 45 units to 66 units.  - We again discussed the pharmacokinetics of long acting insulin, and how it will not cover prandial glucose excursions. Over night her BG dropped down from 429 mg/dL to 353 mg/dL which is a lot.  - I have advised her to control her glucose she needs to focus on prandial hyperglycemia , which is by taking actos and trulicity.  - In review of her chart, she has been seen by GI for abdominal pain and diarrhea and I worry that this is caused by Trulicity, I expressed this concern with her. She is going to be more observant and if she develops nausea/abdomina pain/ diarrhea. She will stop it and we could again discussed SGLT-2 inhibitors vs prandial insulin. I have also advised her about going lower on trulicity but she declined as the smaller dose did not work for her.  - Lindsay Gomez has declined SGLT-2 inhibitors in the past  due to risk of genital infections.    MEDICATIONS:  Decrease Tresiba to 52 units daily   Restart Actos 30 mg daily   Restart Trulicity 1.5 mg  weekly   EDUCATION / INSTRUCTIONS:  BG monitoring instructions: Patient is instructed to check her blood sugars 4 times a day, before meals and bedtime.  Call [facility] if: BG persistently < 70 or > 300. . I reviewed the Rule of 15 for the treatment of hypoglycemia in detail with the patient. Literature supplied.  discussed the assessment and treatment plan with the patient. The patient was provided an opportunity to ask questions and all were answered. The patient agreed with the plan and demonstrated an understanding of the instructions.   The patient was advised to call back or seek an in-person evaluation if the symptoms worsen or if the condition fails to improve as anticipated.    F/U in 3 months    Signed electronically by: Mack Guise, MD  [facility]  [facility] [address] [place], [place] [address] Phone: [phone] FAX: [phone]   CC: Lance Sell, NP No address on file Phone: None  Fax: None  Return to Endocrinology clinic as below: No future appointments.

## 2018-08-09 ENCOUNTER — Encounter: Payer: Self-pay | Admitting: *Deleted

## 2018-08-11 ENCOUNTER — Telehealth: Payer: Self-pay | Admitting: *Deleted

## 2018-08-11 NOTE — Telephone Encounter (Signed)
Letter updated, printed, signed and upfront for p/u. Pt informed.

## 2018-08-12 NOTE — Telephone Encounter (Signed)
Pt stated she need original letter that was dated 4.20.20 with Dr Rennis Petty. please call pt to pick up

## 2018-08-15 NOTE — Telephone Encounter (Signed)
Letter given to patient.

## 2018-08-15 NOTE — Telephone Encounter (Signed)
Pt picked up letter.

## 2018-10-11 ENCOUNTER — Other Ambulatory Visit: Payer: Self-pay

## 2018-10-13 ENCOUNTER — Ambulatory Visit: Payer: BC Managed Care – PPO | Admitting: Internal Medicine

## 2018-10-13 ENCOUNTER — Other Ambulatory Visit: Payer: Self-pay

## 2018-10-17 ENCOUNTER — Ambulatory Visit (INDEPENDENT_AMBULATORY_CARE_PROVIDER_SITE_OTHER): Payer: BC Managed Care – PPO | Admitting: Internal Medicine

## 2018-10-17 ENCOUNTER — Encounter: Payer: Self-pay | Admitting: Internal Medicine

## 2018-10-17 ENCOUNTER — Other Ambulatory Visit: Payer: Self-pay

## 2018-10-17 DIAGNOSIS — E1159 Type 2 diabetes mellitus with other circulatory complications: Secondary | ICD-10-CM

## 2018-10-17 DIAGNOSIS — Z794 Long term (current) use of insulin: Secondary | ICD-10-CM | POA: Diagnosis not present

## 2018-10-17 DIAGNOSIS — E1142 Type 2 diabetes mellitus with diabetic polyneuropathy: Secondary | ICD-10-CM

## 2018-10-17 DIAGNOSIS — E1165 Type 2 diabetes mellitus with hyperglycemia: Secondary | ICD-10-CM

## 2018-10-17 NOTE — Progress Notes (Signed)
Virtual Visit via Video Note  I connected with Lindsay Gomez on 10/17/18 at 10:10 am  by a video enabled telemedicine application and verified that I am speaking with the correct person using two identifiers.   I discussed the limitations of evaluation and management by telemedicine and the availability of in person appointments. The patient expressed understanding and agreed to proceed.  -Location of the patient : Home -Location of the provider : office -The names of all persons participating in the telemedicine service : Pt and myself      Name: Lindsay Gomez  Age/ Sex: 48 y.o., female   MRN/ DOB: [id number], [birth year]     PCP: Lance Sell, NP   Reason for Endocrinology Evaluation: Type 2 Diabetes Mellitus  Initial Endocrine Consultative Visit: 01/07/18    PATIENT IDENTIFIER: Lindsay Gomez is a 48 y.o. female with a past medical history of HTN, Obesity,CVA, OSA not on treatment and T2DM . The patient has followed with Endocrinology clinic since 01/07/18 for consultative assistance with management of her diabetes.  DIABETIC HISTORY:  Lindsay Gomez was diagnosed with T2DM in 2016,She is intolerant to Metformin.On her initial visit to our clinic she was on Basaglar, glipizide and Trulicity, she did admit to noncompliance with her meds, she would take basaglar once a month. Her hemoglobin A1c has ranged from  6.3% in 2014, peaking at 14.1% in 2018  Pt with hx of recurrent yeast infections Lives with boyfriend who has T1DM and on dialysis  SUBJECTIVE:   During the last visit (07/12/2018): She had stopped her medications and we restarted Actos, Trulicity and decreased [place].     Today (10/17/2018): Lindsay Gomez is here for a 3 month followup on diabetes management.  She checks her blood sugars multiple times daily through the [employer].The patient has established primary care services with a new PCP, which has switched trulicity to Ozempic and the pt is  tolerating it well. She was also tried on Jardiance but the pt did not tolerate due to dizziness and SOB,  She is not on tresiba anymore. Otherwise, the patient has not required any recent emergency interventions for hypoglycemia and has not  had recent hospitalizations secondary to hyper or hypoglycemic episodes.   She eats 3 meals a day and snacks twice a day, she avoids sugar sweetened beverages.     ROS: As per HPI and as detailed below: Review of Systems  Respiratory: Negative for cough and shortness of breath.   Cardiovascular: Negative for chest pain and palpitations.  Gastrointestinal:  Negative for nausea.      HOME DIABETES REGIMEN:  Pioglitazone 30 mg daily  Ozempic 0.5 mg weekly   Tresiba - not taking  Trulicity - not taking      GLUCOSE LOG : Range 121-211 mg/dL    DIABETIC COMPLICATIONS: Microvascular complications:   Neuropathy  Denies:  Retinopathy, nephropathy  Last eye exam: Completed 2019  Macrovascular complications:   CVA (80/3212)  Denies: CAD, PVD       HISTORY:  Past Medical History:  Past Medical History:  Diagnosis Date  . Allergy    rhinitis  . Anemia   . Anxiety   . Diabetes mellitus without complication ([place])   . Enlarged heart   . Hypertension   . Morbid obesity ([place])   . Shortness of breath dyspnea    with low iron   Past Surgical History:  Past Surgical History:  Procedure Laterality Date  . ABDOMINAL HYSTERECTOMY N/A  01/29/2015   Procedure: TOTAL ABDOMINAL HYSTERECTOMY ;  Surgeon: Ena Dawley, MD;  Location: [facility];  Service: Gynecology;  Laterality: N/A;  . BILATERAL SALPINGECTOMY Bilateral 01/29/2015   Procedure: BILATERAL SALPINGECTOMY;  Surgeon: Ena Dawley, MD;  Location: [facility];  Service: Gynecology;  Laterality: Bilateral;  . CESAREAN SECTION    . KNEE SURGERY  1992   ? arthroscopic/ right    Social History:  reports that she has never smoked. She has never used smokeless tobacco. She reports  that she does not drink alcohol or use drugs. Family History:  Family History  Problem Relation Age of Onset  . Hypertension Mother   . Diabetes Mother   . Coronary artery disease Father   . Heart failure Father   . Heart attack Father   . Diabetes Father   . Hypertension Other   . Hyperlipidemia Other   . Cancer Paternal Grandmother        unknown type?  . Esophageal cancer Neg Hx   . Colon cancer Neg Hx   . Pancreatic cancer Neg Hx   . Stomach cancer Neg Hx      HOME MEDICATIONS: Allergies as of 10/17/2018      Reactions   Lisinopril Other (See Comments)   Cough       Medication List       Accurate as of October 17, 2018  7:34 AM. If you have any questions, ask your nurse or doctor.        aspirin 81 MG EC tablet Commonly known as: Aspirin 81 Take 1 tablet (81 mg total) by mouth daily. Swallow whole.   Azilsartan-Chlorthalidone 40-25 MG Tabs Commonly known as: Edarbyclor Take 1 tablet by mouth daily.   dicyclomine 10 MG capsule Commonly known as: BENTYL Take 1 capsule (10 mg total) by mouth 3 (three) times daily before meals.   FreeStyle Libre 14 Day Reader Kerrin Mo 1 applicator by Does not apply route daily.   FreeStyle Libre 14 Day Sensor Misc 2 applicators by Does not apply route daily.   glucose blood test strip Use as instructed   ibuprofen 800 MG tablet Commonly known as: ADVIL Take 800 mg by mouth every 8 (eight) hours as needed for moderate pain.   insulin degludec 100 UNIT/ML Sopn FlexTouch Pen Commonly known as: Tyler Aas FlexTouch Inject 0.52 mLs (52 Units total) into the skin daily.   Insulin Pen Needle 32G X 4 MM Misc Commonly known as: BD Pen Needle Nano U/F USE ONCE DAILY AS DIRECTED.   Lancets Misc Use one lancet per test to check blood sugar 1-4 times daily. Dx code:E11.9   meloxicam 7.5 MG tablet Commonly known as: MOBIC Take 1 tablet (7.5 mg total) by mouth daily.   ONE TOUCH ULTRA MINI w/Device Kit Use device to test blood sugars  1-4 times daily as instructed.   pioglitazone 30 MG tablet Commonly known as: Actos Take 1 tablet (30 mg total) by mouth daily.   potassium chloride SA 20 MEQ tablet Commonly known as: K-DUR Take 1 tablet (20 mEq total) by mouth daily.   pregabalin 75 MG capsule Commonly known as: Lyrica Take 1 capsule (75 mg total) by mouth daily.   Trulicity 1.5 YI/5.0YD Sopn Generic drug: Dulaglutide Inject 1.5 mg as directed once a week.       ASSESSMENT / PLAN / RECOMMENDATIONS:   1) Type 2 Diabetes Mellitus, Poorly controlled, With neuropathic and  macrovascular complications - Most recent A1c of 9.0 % (Per pt) .  Goal A1c < 7.0 %.    Plan:  - Pt has some medication changes since her last visit with [place] through her new PCP.  - Her BG's have been acceptable, she would like to continue her diabetes care through her PCP due to high co-pays at specialist visit.  - I have encouraged her to continue avoiding sugar- sweetened beverages and to avoid snacks.  - Pt informed to get all her med refills through PCP , pt expressed understanding.  MEDICATIONS:   Continue  Actos 30 mg daily   Continue ozempic 0.5 mg weekly   EDUCATION / INSTRUCTIONS:  BG monitoring instructions: Patient is instructed to check her blood sugars 2 times a day, before meals and bedtime.  Call [facility] if: BG persistently < 70 or > 300. . I reviewed the Rule of 15 for the treatment of hypoglycemia in detail with the patient. Literature supplied.    F/U in PRN    Signed electronically by: Mack Guise, MD  [facility]  [facility] [address] [place], [place] [address] Phone: [phone] FAX: [phone]   CC: Lance Sell, NP No address on file Phone: None  Fax: None  Return to Endocrinology clinic as below: Future Appointments  Date Time Provider [place]  10/17/2018 10:10 AM , Melanie Crazier, MD  [facility] None

## 2018-12-14 ENCOUNTER — Other Ambulatory Visit: Payer: Self-pay

## 2018-12-16 ENCOUNTER — Encounter: Payer: Self-pay | Admitting: Internal Medicine

## 2018-12-16 ENCOUNTER — Other Ambulatory Visit: Payer: Self-pay

## 2018-12-16 ENCOUNTER — Ambulatory Visit: Payer: BC Managed Care – PPO | Admitting: Internal Medicine

## 2018-12-16 VITALS — BP 124/88 | HR 95 | Temp 98.6°F | Ht 62.0 in | Wt 293.0 lb

## 2018-12-16 DIAGNOSIS — E1159 Type 2 diabetes mellitus with other circulatory complications: Secondary | ICD-10-CM

## 2018-12-16 DIAGNOSIS — Z794 Long term (current) use of insulin: Secondary | ICD-10-CM | POA: Diagnosis not present

## 2018-12-16 DIAGNOSIS — E1142 Type 2 diabetes mellitus with diabetic polyneuropathy: Secondary | ICD-10-CM

## 2018-12-16 DIAGNOSIS — E1165 Type 2 diabetes mellitus with hyperglycemia: Secondary | ICD-10-CM | POA: Diagnosis not present

## 2018-12-16 LAB — BASIC METABOLIC PANEL
BUN: 8 mg/dL (ref 6–23)
CO2: 32 mEq/L (ref 19–32)
Calcium: 9.3 mg/dL (ref 8.4–10.5)
Chloride: 99 mEq/L (ref 96–112)
Creatinine, Ser: 0.7 mg/dL (ref 0.40–1.20)
GFR: 108.86 mL/min (ref >60.00)
Glucose, Bld: 222 mg/dL — ABNORMAL HIGH (ref 70–99)
Potassium: 3.4 mEq/L — ABNORMAL LOW (ref 3.5–5.1)
Sodium: 138 mEq/L (ref 135–145)

## 2018-12-16 LAB — POCT GLYCOSYLATED HEMOGLOBIN (HGB A1C): Hemoglobin A1C: 8.5 % — AB (ref 4.0–5.6)

## 2018-12-16 LAB — MICROALBUMIN / CREATININE URINE RATIO
Creatinine,U: 236.7 mg/dL
Microalb Creat Ratio: 1.6 mg/g (ref 0.0–30.0)
Microalb, Ur: 3.7 mg/dL — ABNORMAL HIGH (ref 0.0–1.9)

## 2018-12-16 LAB — GLUCOSE, POCT (MANUAL RESULT ENTRY): POC Glucose: 249 mg/dl — AB (ref 70–99)

## 2018-12-16 MED ORDER — OZEMPIC (1 MG/DOSE) 2 MG/1.5ML ~~LOC~~ SOPN: 1.0000 mg | PEN_INJECTOR | SUBCUTANEOUS | 3 refills | Status: DC

## 2018-12-16 NOTE — Patient Instructions (Signed)
-   Increase Ozempic to 1 mg weekly  - Continue Pioglitazone 30 mg daily

## 2018-12-16 NOTE — Progress Notes (Signed)
Name: Lindsay Gomez  Age/ Sex: 48 y.o., female   MRN/ DOB: [id number], 1970-06-10     PCP: Lucianne Lei, MD   Reason for Endocrinology Evaluation: Type 2 Diabetes Mellitus  Initial Endocrine Consultative Visit: 01/07/18    PATIENT IDENTIFIER: Lindsay Gomez is a 48 y.o. female with a past medical history of HTN, Obesity,CVA, OSA not on treatment and T2DM . The patient has followed with Endocrinology clinic since 01/07/18 for consultative assistance with management of her diabetes.  DIABETIC HISTORY:  Lindsay Gomez was diagnosed with T2DM in 2016,She is intolerant to Metformin.On her initial visit to our clinic she was on Basaglar, glipizide and Trulicity, she did admit to noncompliance with her meds, she would take basaglar once a month. Her hemoglobin A1c has ranged from  6.3% in 2014, peaking at 14.1% in 2018  Pt with hx of recurrent yeast infections Lives with boyfriend who has T1DM and on dialysis  SUBJECTIVE:   During the last visit (01/07/18): We started Actos 30 mg daily, started [place] at 45 units, continued Trulicity and stopped Glipizide and basaglar  Today (12/16/2018): Lindsay Gomez is here for a  followup on diabetes management.  She checks her blood sugars 1-2 times daily through the [facility] The patient has not had hypoglycemic episodes since the last clinic visit. Otherwise, the patient has not required any recent emergency interventions for hypoglycemia and has not  had recent hospitalizations secondary to hyper or hypoglycemic episodes.      ROS: As per HPI and as detailed below: Review of Systems  Constitutional: Negative for malaise/fatigue and weight loss.  Respiratory: Negative for cough and shortness of breath.   Cardiovascular: Negative for chest pain and palpitations.  Gastrointestinal: Positive for diarrhea. Negative for constipation and nausea.      HOME DIABETES REGIMEN:  Pioglitazone 30 mg daily Ozempic 0.5 mg weekly   CONTINUOUS  GLUCOSE MONITORING RECORD INTERPRETATION    Dates of Recording: 11/16/2018  Sensor description: [employer]  Results statistics:   CGM use % of time 55  Average and SD 259/19.5  Time in range       5 %  % Time Above 180 40  % Time above 250 55  % Time Below target 0    Glycemic patterns summary: Hyperglycemia noted through the day but worse at night   Hyperglycemic episodes  Mostly at night    Hypoglycemic episodes occurred no  Overnight periods: hyperglycemia      HISTORY:  Past Medical History:  Past Medical History:  Diagnosis Date  . Allergy    rhinitis  . Anemia   . Anxiety   . Diabetes mellitus without complication ([place])   . Enlarged heart   . Hypertension   . Morbid obesity ([place])   . Shortness of breath dyspnea    with low iron   Past Surgical History:  Past Surgical History:  Procedure Laterality Date  . ABDOMINAL HYSTERECTOMY N/A 01/29/2015   Procedure: TOTAL ABDOMINAL HYSTERECTOMY ;  Surgeon: Ena Dawley, MD;  Location: [facility];  Service: Gynecology;  Laterality: N/A;  . BILATERAL SALPINGECTOMY Bilateral 01/29/2015   Procedure: BILATERAL SALPINGECTOMY;  Surgeon: Ena Dawley, MD;  Location: [facility];  Service: Gynecology;  Laterality: Bilateral;  . CESAREAN SECTION    . KNEE SURGERY  1992   ? arthroscopic/ right    Social History:  reports that she has never smoked. She has never used smokeless tobacco. She reports that she does not drink alcohol  or use drugs. Family History:  Family History  Problem Relation Age of Onset  . Hypertension Mother   . Diabetes Mother   . Coronary artery disease Father   . Heart failure Father   . Heart attack Father   . Diabetes Father   . Hypertension Other   . Hyperlipidemia Other   . Cancer Paternal Grandmother        unknown type?  . Esophageal cancer Neg Hx   . Colon cancer Neg Hx   . Pancreatic cancer Neg Hx   . Stomach cancer Neg Hx      HOME MEDICATIONS: Allergies as of 12/16/2018       Reactions   Lisinopril Other (See Comments)   Cough       Medication List       Accurate as of December 16, 2018  1:49 PM. If you have any questions, ask your nurse or doctor.        STOP taking these medications   Ozempic (0.25 or 0.5 MG/DOSE) 2 MG/1.5ML Sopn Generic drug: Semaglutide(0.25 or 0.5MG/DOS) Replaced by: Ozempic (1 MG/DOSE) 2 MG/1.5ML Sopn Stopped by: Dorita Sciara, MD   potassium chloride SA 20 MEQ tablet Commonly known as: KLOR-CON Stopped by: Dorita Sciara, MD     TAKE these medications   aspirin 81 MG EC tablet Commonly known as: Aspirin 81 Take 1 tablet (81 mg total) by mouth daily. Swallow whole.   Azilsartan-Chlorthalidone 40-25 MG Tabs Commonly known as: Edarbyclor Take 1 tablet by mouth daily.   dicyclomine 10 MG capsule Commonly known as: BENTYL Take 1 capsule (10 mg total) by mouth 3 (three) times daily before meals.   FreeStyle Libre 14 Day Reader [place] Mo 1 applicator by Does not apply route daily.   FreeStyle Libre 14 Day Sensor Misc 2 applicators by Does not apply route daily.   glucose blood test strip Use as instructed   ibuprofen 800 MG tablet Commonly known as: ADVIL Take 800 mg by mouth every 8 (eight) hours as needed for moderate pain.   Insulin Pen Needle 32G X 4 MM Misc Commonly known as: BD Pen Needle Nano U/F USE ONCE DAILY AS DIRECTED.   Lancets Misc Use one lancet per test to check blood sugar 1-4 times daily. Dx code:E11.9   meloxicam 7.5 MG tablet Commonly known as: MOBIC Take 1 tablet (7.5 mg total) by mouth daily.   ONE TOUCH ULTRA MINI w/Device Kit Use device to test blood sugars 1-4 times daily as instructed.   Ozempic (1 MG/DOSE) 2 MG/1.5ML Sopn Generic drug: Semaglutide (1 MG/DOSE) Inject 1 mg into the skin once a week. Replaces: Ozempic (0.25 or 0.5 MG/DOSE) 2 MG/1.5ML Sopn Started by: Dorita Sciara, MD   pioglitazone 30 MG tablet Commonly known as: Actos Take 1 tablet (30 mg total)  by mouth daily.   pregabalin 75 MG capsule Commonly known as: Lyrica Take 1 capsule (75 mg total) by mouth daily.        OBJECTIVE:   Vital Signs: BP 124/88 (BP Location: Left Arm, Patient Position: Sitting, Cuff Size: Large)   Pulse 95   Temp 98.6 F (37 C)   Ht 5' 2"  (1.575 m)   Wt 293 lb (132.9 kg)   LMP 12/18/2014 (Exact Date)   SpO2 96%   BMI 53.59 kg/m   Wt Readings from Last 3 Encounters:  12/16/18 293 lb (132.9 kg)  06/15/18 284 lb (128.8 kg)  06/02/18 288 lb (130.6 kg)  Exam: General: Pt appears well and is in NAD  Lungs: Clear with good BS bilat with no rales, rhonchi, or wheezes  Heart: RRR with normal S1 and S2 and no gallops; no murmurs; no rub  Abdomen: Normoactive bowel sounds, soft, nontender, without masses or organomegaly palpable  Extremities: +1 pretibial edema. No tremor.   Neuro: MS is good with appropriate affect, pt is alert and Ox3   Foot DM 12/16/2018   The skin of the feet is intact without sores or ulcerations. The pedal pulses are 2+ on right and 2+ on left. The sensation is intact to a screening 5.07, 10 gram monofilament bilaterally Results for AKYA, FIORELLO (MRN [id number]) as of 12/19/2018 09:08  Ref. Range 12/16/2018 13:46  Sodium Latest Ref Range: 135 - 145 mEq/L 138  Potassium Latest Ref Range: 3.5 - 5.1 mEq/L 3.4 (L)  Chloride Latest Ref Range: 96 - 112 mEq/L 99  CO2 Latest Ref Range: 19 - 32 mEq/L 32  Glucose Latest Ref Range: 70 - 99 mg/dL 222 (H)  BUN Latest Ref Range: 6 - 23 mg/dL 8  Creatinine Latest Ref Range: 0.40 - 1.20 mg/dL 0.70  Calcium Latest Ref Range: 8.4 - 10.5 mg/dL 9.3  GFR Latest Ref Range: >60.00 mL/min 108.86  MICROALB/CREAT RATIO Latest Ref Range: 0.0 - 30.0 mg/g 1.6  Creatinine,U Latest Units: mg/dL 236.7  Microalb, Ur Latest Ref Range: 0.0 - 1.9 mg/dL 3.7 (H)    ASSESSMENT / PLAN / RECOMMENDATIONS:   1) Type 2 Diabetes Mellitus, poorly controlled, With microvascular and macrovascular  complications - Most recent A1c of 8.5 %. Goal A1c < 7.0 %.    Plan:  - Pt continues to improve with glycemic control  - I have encouraged her to continue with lifestyle changes and using the sensor more often.  - Pt declined SGLT-2 inhibitors in the past due to risk of genital infections.  MEDICATIONS:  Continue Actos 30 mg daily   Increase Ozempic to 1 mg weekly   EDUCATION / INSTRUCTIONS:  BG monitoring instructions: Patient is instructed to check her blood sugars 3 times a day, before meals and bedtime.  Call [facility] if: BG persistently < 70 or > 300. . I reviewed the Rule of 15 for the treatment of hypoglycemia in detail with the patient. Literature supplied.    Signed electronically by: Mack Guise, MD  [facility] Endocrinology  [facility] [address] [place], [place] [address] Phone: [phone] FAX: [phone]   CC: Lucianne Lei, [address] [place] [address] Phone: [phone]  Fax: [phone]  Return to Endocrinology clinic as below: Future Appointments  Date Time Provider [place]  03/24/2019  3:40 PM Shamleffer, Melanie Crazier, MD [id number] None

## 2018-12-19 ENCOUNTER — Encounter: Payer: Self-pay | Admitting: Internal Medicine

## 2019-02-05 IMAGING — DX DG LUMBAR SPINE COMPLETE 4+V
5 series · 5 of 5 positions shown · non-contrast
Comparison: 09/23/2017

CLINICAL DATA: Low back pain for 6 months

EXAM:
LUMBAR SPINE - COMPLETE 4+ VIEW

[l-spine ap]
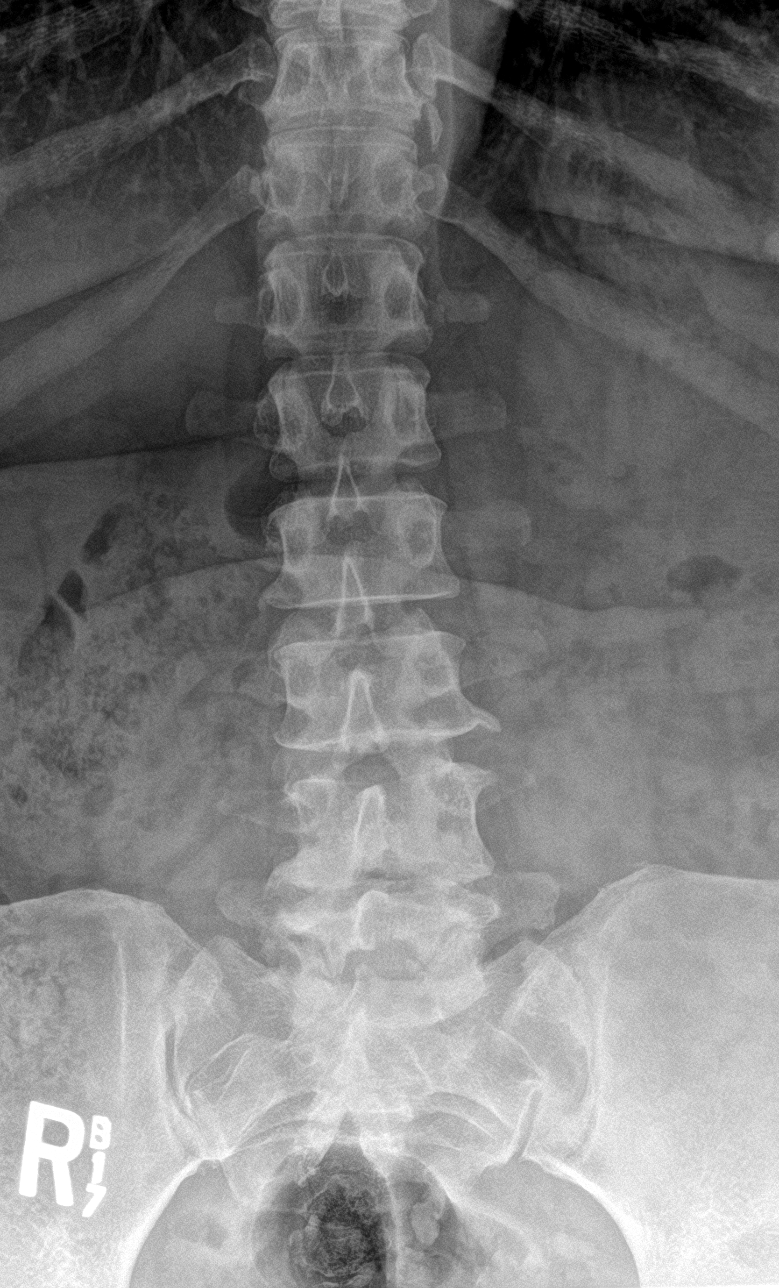

[l-spine obl (1 of 2)]
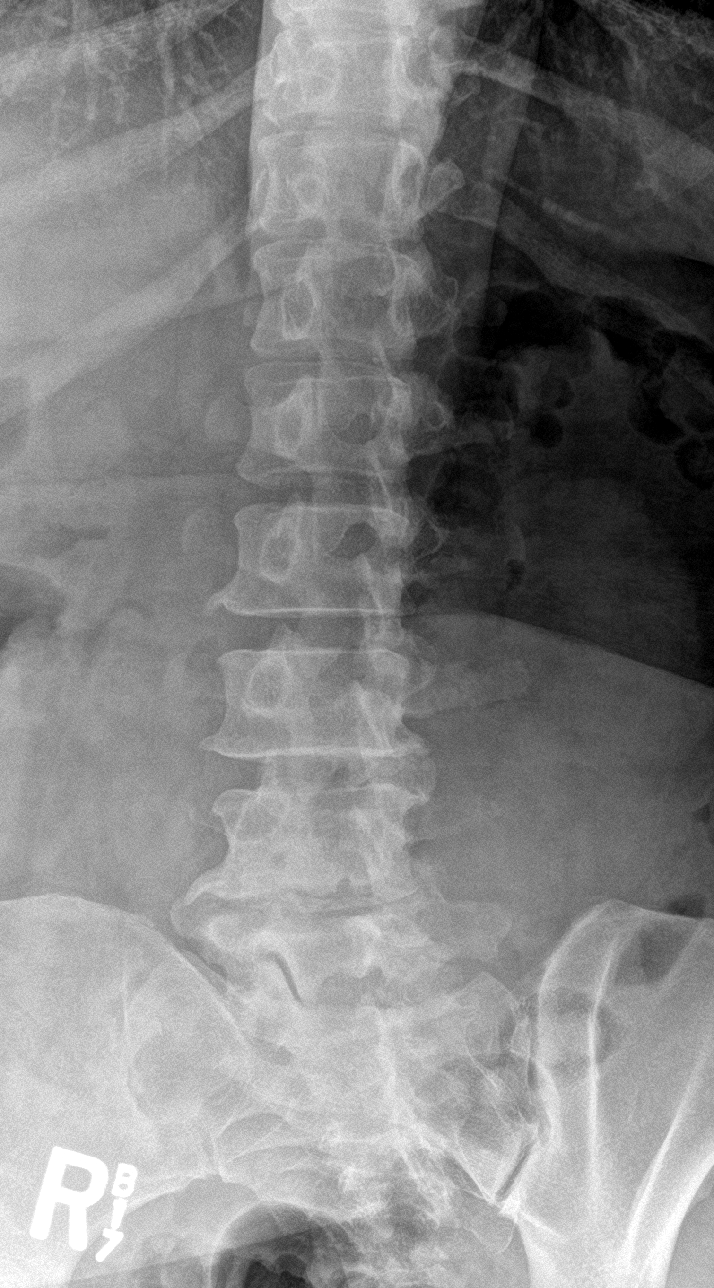

[l-spine obl (2 of 2)]
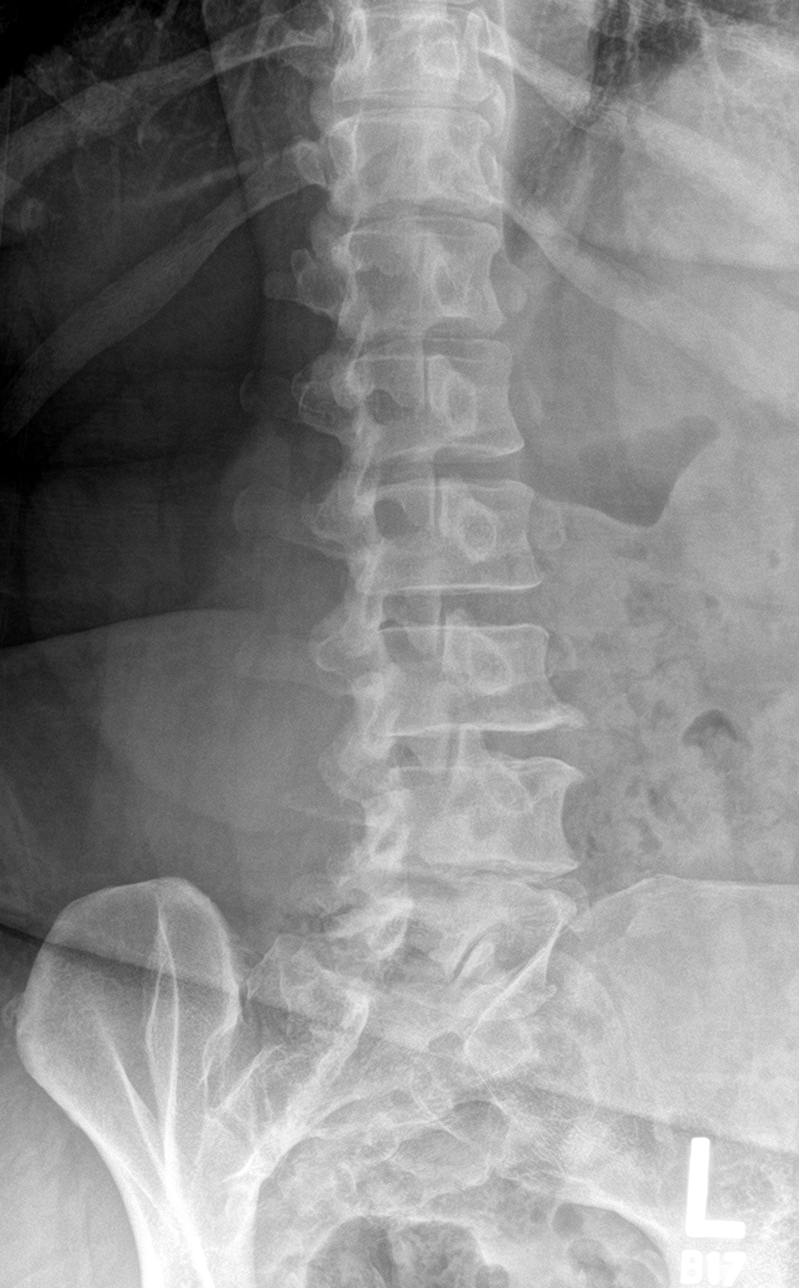

[l-spine lat]
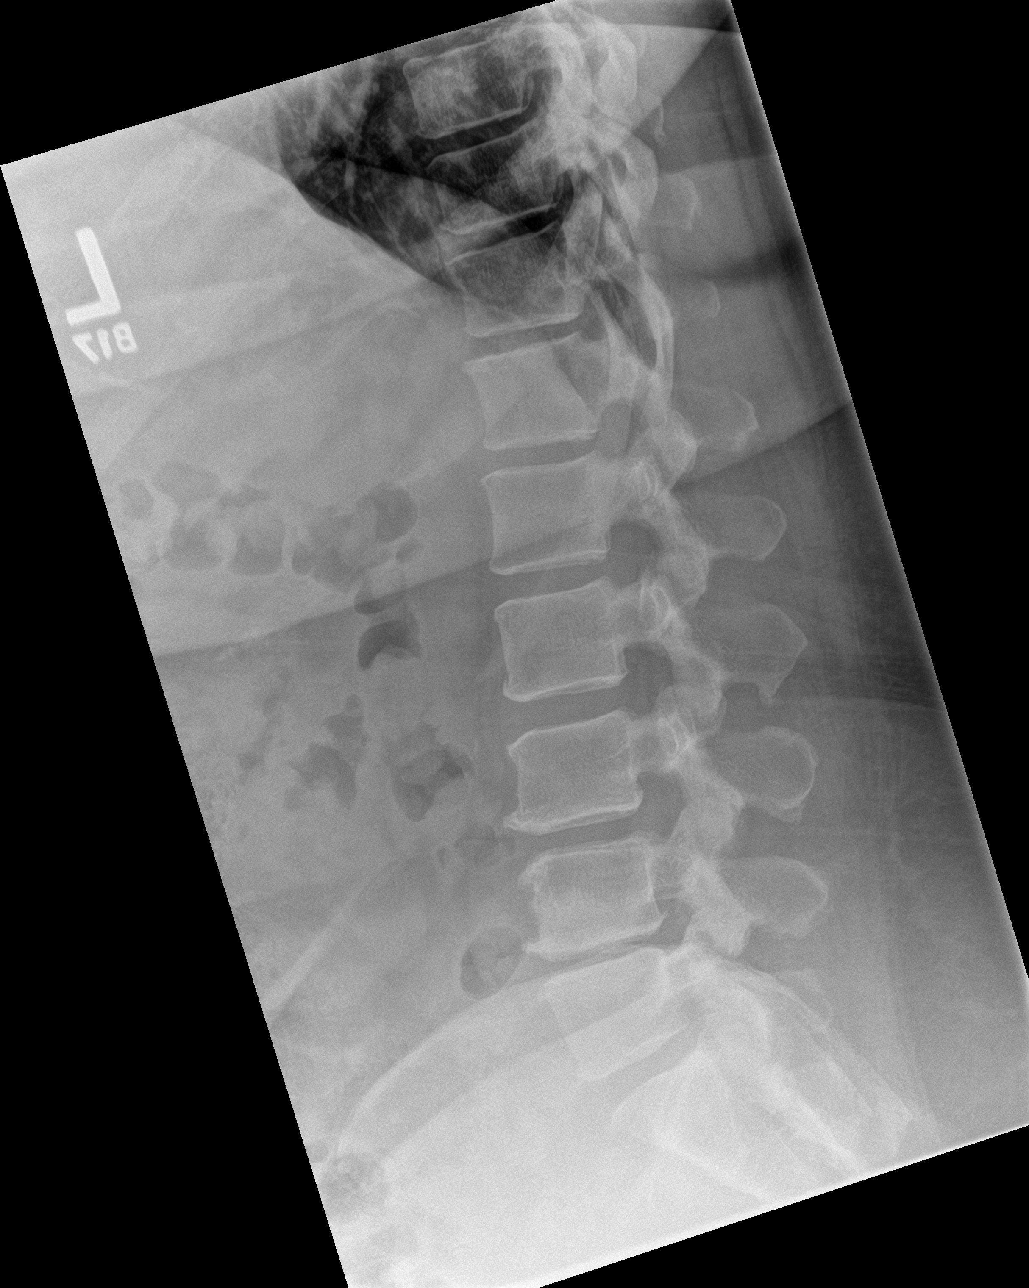

[l-spine spot]
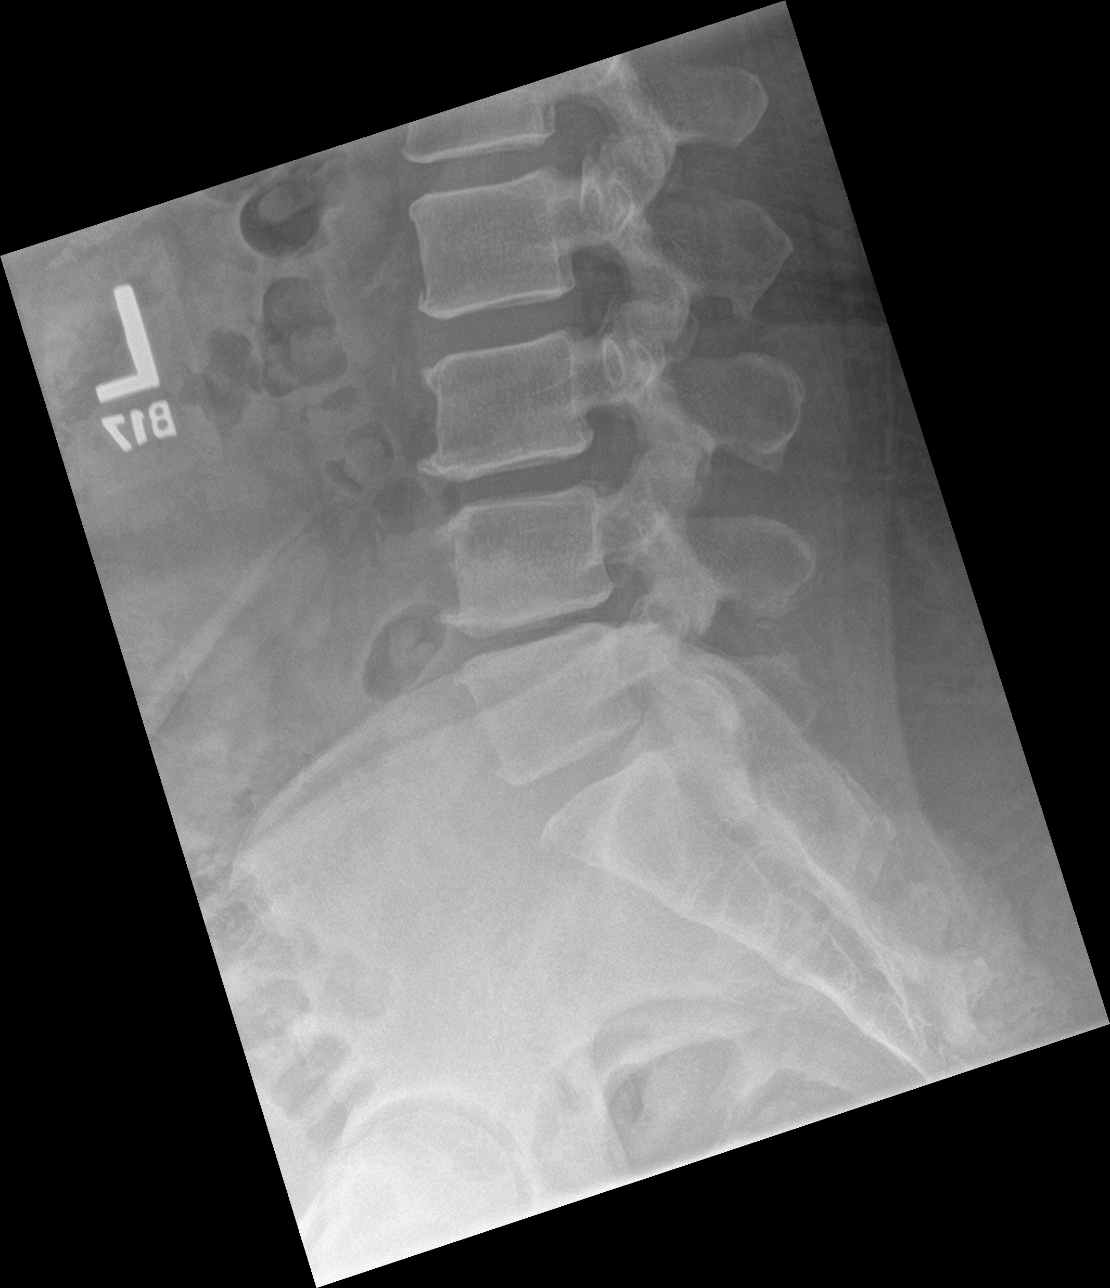

[5 of 5 positions shown; findings below may reference images not displayed]

FINDINGS: There is anatomic alignment. There is no vertebral compression
deformity. Mild narrowing of the L3-4 disc, moderate narrowing of
the L4-5 disc. Anterior osteophytes from the L2-3 disc through
L5-S1.
IMPRESSION: No acute bony pathology.  Chronic changes.

## 2019-03-21 ENCOUNTER — Other Ambulatory Visit: Payer: Self-pay

## 2019-03-23 ENCOUNTER — Ambulatory Visit (INDEPENDENT_AMBULATORY_CARE_PROVIDER_SITE_OTHER): Payer: BC Managed Care – PPO | Admitting: Internal Medicine

## 2019-03-23 VITALS — BP 158/88 | HR 83 | Temp 98.3°F | Ht 62.0 in | Wt 291.6 lb

## 2019-03-23 DIAGNOSIS — E1159 Type 2 diabetes mellitus with other circulatory complications: Secondary | ICD-10-CM | POA: Diagnosis not present

## 2019-03-23 LAB — GLUCOSE, POCT (MANUAL RESULT ENTRY): POC Glucose: 309 mg/dl — AB (ref 70–99)

## 2019-03-23 MED ORDER — NOVOLOG MIX 70/30 FLEXPEN (70-30) 100 UNIT/ML ~~LOC~~ SUPN: 28.0000 [IU] | PEN_INJECTOR | Freq: Two times a day (BID) | SUBCUTANEOUS | 11 refills | Status: DC

## 2019-03-23 MED ORDER — INSULIN PEN NEEDLE 32G X 4 MM MISC: 1.0000 | Freq: Two times a day (BID) | 11 refills | Status: DC

## 2019-03-23 NOTE — Patient Instructions (Addendum)
-   Continue Ozempic to 1 mg weekly  - Continue Pioglitazone 30 mg daily  - Novolog Mix 28 units with first meal and last meal of the day       HOW TO TREAT LOW BLOOD SUGARS (Blood sugar LESS THAN 70 MG/DL)  Please follow the RULE OF 15 for the treatment of hypoglycemia treatment (when your (blood sugars are less than 70 mg/dL)    STEP 1: Take 15 grams of carbohydrates when your blood sugar is low, which includes:   3-4 GLUCOSE TABS  OR  3-4 OZ OF JUICE OR REGULAR SODA OR  ONE TUBE OF GLUCOSE GEL     STEP 2: RECHECK blood sugar in 15 MINUTES STEP 3: If your blood sugar is still low at the 15 minute recheck --> then, go back to STEP 1 and treat AGAIN with another 15 grams of carbohydrates.

## 2019-03-23 NOTE — Progress Notes (Signed)
Name: Lindsay Gomez  Age/ Sex: 49 y.o., female   MRN/ DOB: [id number], Aug 05, 1970     PCP: Lindsay Lei, MD   Reason for Endocrinology Evaluation: Type 2 Diabetes Mellitus  Initial Endocrine Consultative Visit: 01/07/18    PATIENT IDENTIFIER: Lindsay Gomez is a 49 y.o. female with a past medical history of HTN, Obesity,CVA, OSA not on treatment and T2DM . The patient has followed with Endocrinology clinic since 01/07/18 for consultative assistance with management of her diabetes.  DIABETIC HISTORY:  Lindsay Gomez was diagnosed with T2DM in 2016,She is intolerant to Metformin.On her initial visit to our clinic she was on Basaglar, glipizide and Trulicity, she did admit to noncompliance with her meds, she would take basaglar once a month. Her hemoglobin A1c has ranged from  6.3% in 2014, peaking at 14.1% in 2018  Pt with hx of recurrent yeast infections Lives with Lindsay Gomez who has T1DM and on dialysis  SUBJECTIVE:   During the last visit (12/16/2018): W continued  Actos 30 mg daily, increased Ozempic  Today (03/23/2019): Lindsay Gomez is here for a 3 month  followup on diabetes management.  She has not been checking sugars due to cost issues with the freestyle libre.The patient has not had hypoglycemic episodes since the last clinic visit. Otherwise, the patient has not required any recent emergency interventions for hypoglycemia and has not  had recent hospitalizations secondary to hyper or hypoglycemic episodes.    Lindsay Gomez passed in 01/2019  ROS: As per HPI and as detailed below: Review of Systems  Constitutional: Negative for malaise/fatigue and weight loss.  Respiratory: Negative for cough and shortness of breath.   Cardiovascular: Negative for chest pain and palpitations.  Gastrointestinal: Positive for diarrhea. Negative for constipation and nausea.      HOME DIABETES REGIMEN:  Pioglitazone 30 mg daily Ozempic 1 mg weekly        HISTORY:  Past Medical History:   Past Medical History:  Diagnosis Date  . Allergy    rhinitis  . Anemia   . Anxiety   . Diabetes mellitus without complication ([place])   . Enlarged heart   . Hypertension   . Morbid obesity ([place])   . Shortness of breath dyspnea    with low iron   Past Surgical History:  Past Surgical History:  Procedure Laterality Date  . ABDOMINAL HYSTERECTOMY N/A 01/29/2015   Procedure: TOTAL ABDOMINAL HYSTERECTOMY ;  Surgeon: Ena Dawley, MD;  Location: [facility];  Service: Gynecology;  Laterality: N/A;  . BILATERAL SALPINGECTOMY Bilateral 01/29/2015   Procedure: BILATERAL SALPINGECTOMY;  Surgeon: Ena Dawley, MD;  Location: [facility];  Service: Gynecology;  Laterality: Bilateral;  . CESAREAN SECTION    . KNEE SURGERY  1992   ? arthroscopic/ right    Social History:  reports that she has never smoked. She has never used smokeless tobacco. She reports that she does not drink alcohol or use drugs. Family History:  Family History  Problem Relation Age of Onset  . Hypertension Mother   . Diabetes Mother   . Coronary artery disease Lindsay Gomez   . Heart failure Lindsay Gomez   . Heart attack Lindsay Gomez   . Diabetes Lindsay Gomez   . Hypertension Other   . Hyperlipidemia Other   . Cancer Paternal Grandmother        unknown type?  . Esophageal cancer Neg Hx   . Colon cancer Neg Hx   . Pancreatic cancer Neg Hx   . Stomach cancer Neg Hx  HOME MEDICATIONS: Allergies as of 03/23/2019      Reactions   Lisinopril Other (See Comments)   Cough       Medication List       Accurate as of March 23, 2019  3:07 PM. If you have any questions, ask your nurse or doctor.        STOP taking these medications   FreeStyle Libre 14 Day Reader [facility] 14 Day Sensor Misc     TAKE these medications   aspirin 81 MG EC tablet Commonly known as: Aspirin 81 Take 1 tablet (81 mg total) by mouth daily. Swallow whole.   Azilsartan-Chlorthalidone 40-25 MG Tabs Commonly known as: Edarbyclor Take 1  tablet by mouth daily.   dicyclomine 10 MG capsule Commonly known as: BENTYL Take 1 capsule (10 mg total) by mouth 3 (three) times daily before meals.   glucose blood test strip Use as instructed   ibuprofen 800 MG tablet Commonly known as: ADVIL Take 800 mg by mouth every 8 (eight) hours as needed for moderate pain.   Insulin Pen Needle 32G X 4 MM Misc Commonly known as: BD Pen Needle Nano U/F USE ONCE DAILY AS DIRECTED.   Lancets Misc Use one lancet per test to check blood sugar 1-4 times daily. Dx code:E11.9   meloxicam 7.5 MG tablet Commonly known as: MOBIC Take 1 tablet (7.5 mg total) by mouth daily.   ONE TOUCH ULTRA MINI w/Device Kit Use device to test blood sugars 1-4 times daily as instructed.   Ozempic (1 MG/DOSE) 2 MG/1.5ML Sopn Generic drug: Semaglutide (1 MG/DOSE) Inject 1 mg into the skin once a week.   pioglitazone 30 MG tablet Commonly known as: Actos Take 1 tablet (30 mg total) by mouth daily.   pregabalin 75 MG capsule Commonly known as: Lyrica Take 1 capsule (75 mg total) by mouth daily.        OBJECTIVE:   Vital Signs: BP (!) 158/88 (BP Location: Right Arm, Patient Position: Sitting, Cuff Size: Large)   Pulse 83   Temp 98.3 F (36.8 C)   Ht _0  (1.575 m)   Wt 291 lb 9.6 oz (132.3 kg)   LMP 12/18/2014 (Exact Date)   SpO2 98%   BMI 53.33 kg/m   Wt Readings from Last 3 Encounters:  03/23/19 291 lb 9.6 oz (132.3 kg)  12/16/18 293 lb (132.9 kg)  06/15/18 284 lb (128.8 kg)     Exam: General: Pt appears well and is in NAD  Lungs: Clear with good BS bilat with no rales, rhonchi, or wheezes  Heart: RRR with normal S1 and S2   Abdomen: Normoactive bowel sounds, soft, nontender, without masses or organomegaly palpable  Extremities: Trace pretibial edema.    Neuro: MS is good with appropriate affect, pt is alert and Ox3   Foot DM 12/16/2018   The skin of the feet is intact without sores or ulcerations. The pedal pulses are 2+ on right  and 2+ on left. The sensation is intact to a screening 5.07, 10 gram monofilament bilaterally     Results for Lindsay Gomez (MRN [id number]) as of 12/19/2018 09:08  Ref. Range 12/16/2018 13:46  Sodium Latest Ref Range: 135 - 145 mEq/L 138  Potassium Latest Ref Range: 3.5 - 5.1 mEq/L 3.4 (L)  Chloride Latest Ref Range: 96 - 112 mEq/L 99  CO2 Latest Ref Range: 19 - 32 mEq/L 32  Glucose Latest Ref Range: 70 - 99 mg/dL 222 (H)  BUN Latest Ref Range: 6 - 23 mg/dL 8  Creatinine Latest Ref Range: 0.40 - 1.20 mg/dL 0.70  Calcium Latest Ref Range: 8.4 - 10.5 mg/dL 9.3  GFR Latest Ref Range: >60.00 mL/min 108.86  MICROALB/CREAT RATIO Latest Ref Range: 0.0 - 30.0 mg/g 1.6  Creatinine,U Latest Units: mg/dL 236.7  Microalb, Ur Latest Ref Range: 0.0 - 1.9 mg/dL 3.7 (H)    ASSESSMENT / PLAN / RECOMMENDATIONS:   1) Type 2 Diabetes Mellitus, poorly controlled, With neuropathic  and macrovascular complications - Most recent A1c of 11.1 %. Goal A1c < 7.0 %.     - Pt continues with hyperglycemia due to continued dietary indiscretion and medication non-compliance.   - In office BG 309 mg/dL.  - She continues with diarrhea, I have advised her this is most likely due to [place] but she declines to stop it at this time, as she feels it helps her. She is doing better on it then the trulicity.  - I have advised her that the quickest way to reduce her sugars is insulin,  She is very reluctant to this, after a long back and forth, she agreed to try insulin at least until her next visit.  - I have encouraged her to continue with lifestyle changes - We discussed her high risk for diabetes complications, given that her sugars have not been at goal for the past 4 years.   - Pt declined SGLT-2 inhibitors in the past due to risk of genital infections.  MEDICATIONS:  Continue Actos 30 mg daily    Ozempic 1 mg weekly   Start Novolog Mix 28 units with Breakfast and Supper   EDUCATION / INSTRUCTIONS:  BG  monitoring instructions: Patient is instructed to check her blood sugars 3 times a day, before meals and bedtime.  Call [facility] if: BG persistently < 70 or > 300. . I reviewed the Rule of 15 for the treatment of hypoglycemia in detail with the patient. Literature supplied.  35 minutes were spent with the patient.   Signed electronically by: Mack Guise, MD  [facility] Endocrinology  [facility] [address] [place], [place] [address] Phone: [phone] FAX: [phone]   CC: Lindsay Gomez, [address] [place] [address] Phone: [phone]  Fax: [phone]  Return to Endocrinology clinic as below: No future appointments.

## 2019-03-24 ENCOUNTER — Ambulatory Visit: Payer: BC Managed Care – PPO | Admitting: Internal Medicine

## 2019-03-26 ENCOUNTER — Encounter: Payer: Self-pay | Admitting: Internal Medicine

## 2019-06-15 ENCOUNTER — Other Ambulatory Visit: Payer: Self-pay | Admitting: Internal Medicine

## 2019-07-05 ENCOUNTER — Ambulatory Visit: Payer: BC Managed Care – PPO | Admitting: Internal Medicine

## 2019-09-08 ENCOUNTER — Emergency Department (HOSPITAL_COMMUNITY): Payer: BC Managed Care – PPO

## 2019-09-08 ENCOUNTER — Other Ambulatory Visit: Payer: Self-pay

## 2019-09-08 ENCOUNTER — Encounter (HOSPITAL_COMMUNITY): Payer: Self-pay | Admitting: Emergency Medicine

## 2019-09-08 ENCOUNTER — Emergency Department (HOSPITAL_COMMUNITY)
Admission: EM | Admit: 2019-09-08 | Discharge: 2019-09-08 | Disposition: A | Discharge status: Home / Self Care | Payer: BC Managed Care – PPO | Attending: Emergency Medicine | Admitting: Emergency Medicine

## 2019-09-08 ENCOUNTER — Emergency Department (HOSPITAL_BASED_OUTPATIENT_CLINIC_OR_DEPARTMENT_OTHER): Payer: BC Managed Care – PPO

## 2019-09-08 DIAGNOSIS — M5432 Sciatica, left side: Secondary | ICD-10-CM | POA: Diagnosis not present

## 2019-09-08 DIAGNOSIS — Z794 Long term (current) use of insulin: Secondary | ICD-10-CM | POA: Insufficient documentation

## 2019-09-08 DIAGNOSIS — R0789 Other chest pain: Secondary | ICD-10-CM | POA: Diagnosis not present

## 2019-09-08 DIAGNOSIS — R0602 Shortness of breath: Secondary | ICD-10-CM | POA: Diagnosis not present

## 2019-09-08 DIAGNOSIS — I10 Essential (primary) hypertension: Secondary | ICD-10-CM | POA: Diagnosis not present

## 2019-09-08 DIAGNOSIS — Z7982 Long term (current) use of aspirin: Secondary | ICD-10-CM | POA: Insufficient documentation

## 2019-09-08 DIAGNOSIS — Z8673 Personal history of transient ischemic attack (TIA), and cerebral infarction without residual deficits: Secondary | ICD-10-CM | POA: Insufficient documentation

## 2019-09-08 DIAGNOSIS — E119 Type 2 diabetes mellitus without complications: Secondary | ICD-10-CM | POA: Insufficient documentation

## 2019-09-08 DIAGNOSIS — M545 Low back pain: Secondary | ICD-10-CM | POA: Diagnosis not present

## 2019-09-08 DIAGNOSIS — Z79899 Other long term (current) drug therapy: Secondary | ICD-10-CM | POA: Diagnosis not present

## 2019-09-08 DIAGNOSIS — M79662 Pain in left lower leg: Secondary | ICD-10-CM | POA: Diagnosis present

## 2019-09-08 LAB — CBC
HCT: 45.1 % (ref 36.0–46.0)
Hemoglobin: 14.2 g/dL (ref 12.0–15.0)
MCH: 27.4 pg (ref 26.0–34.0)
MCHC: 31.5 g/dL (ref 30.0–36.0)
MCV: 86.9 fL (ref 80.0–100.0)
Platelets: 257 10*3/uL (ref 150–400)
RBC: 5.19 MIL/uL — ABNORMAL HIGH (ref 3.87–5.11)
RDW: 13.9 % (ref 11.5–15.5)
WBC: 6.1 10*3/uL (ref 4.0–10.5)
nRBC: 0 % (ref 0.0–0.2)

## 2019-09-08 LAB — D-DIMER, QUANTITATIVE: D-Dimer, Quant: 0.34 ug/mL-FEU (ref 0.00–0.50)

## 2019-09-08 LAB — BASIC METABOLIC PANEL
Anion gap: 12 (ref 5–15)
BUN: 10 mg/dL (ref 6–20)
CO2: 25 mmol/L (ref 22–32)
Calcium: 8.7 mg/dL — ABNORMAL LOW (ref 8.9–10.3)
Chloride: 98 mmol/L (ref 98–111)
Creatinine, Ser: 0.8 mg/dL (ref 0.44–1.00)
GFR calc Af Amer: >60 mL/min (ref >60)
GFR calc non Af Amer: >60 mL/min (ref >60)
Glucose, Bld: 259 mg/dL — ABNORMAL HIGH (ref 70–99)
Potassium: 4.3 mmol/L (ref 3.5–5.1)
Sodium: 135 mmol/L (ref 135–145)

## 2019-09-08 LAB — TROPONIN I (HIGH SENSITIVITY)
Troponin I (High Sensitivity): 4 ng/L (ref <18)
Troponin I (High Sensitivity): 5 ng/L (ref <18)

## 2019-09-08 MED ORDER — KETOROLAC TROMETHAMINE 15 MG/ML IJ SOLN
15.0000 mg | Freq: Once | INTRAMUSCULAR | Status: AC
  Administered 2019-09-08: 15 mg via INTRAVENOUS
  Filled 2019-09-08: qty 1

## 2019-09-08 MED ORDER — LORAZEPAM 2 MG/ML IJ SOLN
1.0000 mg | Freq: Once | INTRAMUSCULAR | Status: AC | PRN
  Administered 2019-09-08: 1 mg via INTRAVENOUS
  Filled 2019-09-08: qty 1

## 2019-09-08 MED ORDER — CYCLOBENZAPRINE HCL 10 MG PO TABS
10.0000 mg | ORAL_TABLET | Freq: Two times a day (BID) | ORAL | 0 refills | Status: DC | PRN
Start: 2019-09-08 — End: 2019-09-26

## 2019-09-08 MED ORDER — SODIUM CHLORIDE 0.9% FLUSH
3.0000 mL | Freq: Once | INTRAVENOUS | Status: AC
  Administered 2019-09-08: 3 mL via INTRAVENOUS

## 2019-09-08 MED ORDER — HYDROCODONE-ACETAMINOPHEN 5-325 MG PO TABS
1.0000 | ORAL_TABLET | ORAL | Status: DC | PRN
  Administered 2019-09-08: 1 via ORAL
  Filled 2019-09-08 (×2): qty 1

## 2019-09-08 MED ORDER — IBUPROFEN 800 MG PO TABS
800.0000 mg | ORAL_TABLET | Freq: Three times a day (TID) | ORAL | 0 refills | Status: DC | PRN
Start: 2019-09-08 — End: 2020-08-11

## 2019-09-08 MED ORDER — HYDROCODONE-ACETAMINOPHEN 5-325 MG PO TABS: 1.0000 | ORAL_TABLET | Freq: Four times a day (QID) | ORAL | 0 refills | Status: DC | PRN

## 2019-09-08 NOTE — Progress Notes (Signed)
Venous duplex       has been completed. Preliminary results can be found under CV proc through chart review. Amellia Panik, BS, RDMS, RVT   

## 2019-09-08 NOTE — ED Notes (Signed)
Pt transported to Xray. 

## 2019-09-08 NOTE — ED Triage Notes (Signed)
Pt reports worsening lower back and left leg pain that has been intermittent for the last month that flared up last night. Pt reports pain affects her mostly when she is laying down at night. Pt reports it may feel like sciatica. Denies any injuries. Also reports right sided intermittent chest pain with sob.

## 2019-09-08 NOTE — ED Notes (Signed)
Ambulated to bathroom independently w/o difficulty. Pt resp e/u

## 2019-09-08 NOTE — ED Provider Notes (Signed)
[facility] EMERGENCY DEPARTMENT Provider Note   CSN: [id number] Arrival date & time: 09/08/19  2355     History Chief Complaint  Patient presents with   Sciatica   Shortness of Breath    Lindsay Gomez is a 49 y.o. female.  The history is provided by the patient and medical records. No language interpreter was used.  Shortness of Breath  Lindsay Gomez is a 49 y.o. female who presents to the Emergency Department complaining of leg pain and shortness of breath. She presents the emergency department complaining of one month of left lower extremity pain. Pain is located from her hip to her ankle. It is described as both achy and sharp. Initially pain was intermittent but now it is constant. Pain is worse when she goes to sleep at night. She also has associated low back pain. No injuries. Two days ago she developed some shortness of breath and intermittent left sided chest discomfort. Chest discomfort is described as both dull and sharp and episodes wax and wane with no clear alleviating or worsening factors. Episodes last about a second at a time. No fevers, cough, abdominal pain, nausea, vomiting, leg swelling. She has a history of diabetes, hypertension. She has a family history of coronary artery disease and her father. No personal or family history of clotting disorders.    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   Cerebrovascular accident (CVA) ([place]) 03/14/2017   Hypocalcemia 03/03/2017   Cerebral thrombosis with cerebral  infarction 03/03/2017   Major depressive disorder, single episode, moderate ([place]) 10/28/2015   Microcytic hypochromic anemia 07/29/2015   Type 2 diabetes mellitus ([place]) 06/24/2015   Depression 06/24/2015   Obstructive sleep apnea 06/17/2015   Anxiety and depression 04/24/2015   Pre-op evaluation 01/09/2015   Hyperlipidemia 01/06/2013   Right lumbar radiculopathy 10/26/2012   ANA positive 09/14/2012   LVH (left ventricular hypertrophy) 12/08/2011   Menorrhagia 09/24/2011   Routine general medical examination at a health care facility 09/24/2011   Carpal tunnel syndrome of right wrist 09/18/2010   Hypokalemia 09/18/2010   Morbid obesity ([place]) 11/29/2008   Iron deficiency anemia 11/28/2008   Essential hypertension 11/28/2008   ALLERGIC RHINITIS 11/28/2008    Past Surgical History:  Procedure Laterality Date   ABDOMINAL HYSTERECTOMY N/A 01/29/2015   Procedure: TOTAL ABDOMINAL HYSTERECTOMY ;  Surgeon: Ena Dawley, MD;  Location: [facility];  Service: Gynecology;  Laterality: N/A;   BILATERAL SALPINGECTOMY Bilateral 01/29/2015   Procedure: BILATERAL SALPINGECTOMY;  Surgeon: Ena Dawley, MD;  Location: [facility];  Service: Gynecology;  Laterality: Bilateral;   [place]   ? arthroscopic/ right     OB History    Gravida  3   Para  1   Term      Preterm      AB  1   Living  2     SAB  1   TAB  Ectopic      Multiple      Live Births              Family History  Problem Relation Age of Onset   Hypertension Mother    Diabetes Mother    Coronary artery disease Father    Heart failure Father    Heart attack Father    Diabetes Father    Hypertension Other    Hyperlipidemia Other    Cancer Paternal Grandmother        unknown type?   Esophageal cancer Neg Hx    Colon cancer Neg Hx    Pancreatic cancer Neg Hx    Stomach cancer Neg Hx     Social History   Tobacco Use   Smoking  status: Never Smoker   Smokeless tobacco: Never Used  Vaping Use   Vaping Use: Never used  Substance Use Topics   Alcohol use: No   Drug use: No    Home Medications Prior to Admission medications   Medication Sig Start Date End Date Taking? Authorizing Provider  aspirin (ASPIRIN 81) 81 MG EC tablet Take 1 tablet (81 mg total) by mouth daily. Swallow whole. 03/24/18  Yes Shambley, Delphia Grates, NP  Azilsartan-Chlorthalidone (EDARBYCLOR) 40-25 MG TABS Take 1 tablet by mouth daily. 03/24/18  Yes Lance Sell, NP  calcium carbonate (TUMS - DOSED IN MG ELEMENTAL CALCIUM) 500 MG chewable tablet Chew 1-2 tablets by mouth as needed for indigestion or heartburn.   Yes [provider]  insulin aspart protamine - aspart (NOVOLOG MIX 70/30 FLEXPEN) (70-30) 100 UNIT/ML FlexPen Inject 0.28 mLs (28 Units total) into the skin 2 (two) times daily with a meal. 03/23/19  Yes Shamleffer, Melanie Crazier, MD  Semaglutide, 1 MG/DOSE, (OZEMPIC, 1 MG/DOSE,) 2 MG/1.5ML SOPN Inject 1 mg into the skin once a week. Patient taking differently: Inject 1 mg into the skin once a week. Tuesday 12/16/18  Yes Shamleffer, Melanie Crazier, MD  Blood Glucose Monitoring Suppl (ONE TOUCH ULTRA MINI) w/Device KIT Use device to test blood sugars 1-4 times daily as instructed. 06/24/15   [place] Circle, FNP  Continuous Blood Gluc Sensor (FREESTYLE LIBRE 14 DAY SENSOR) MISC APPLY AS DIRECTED AND CHANGE EVERY 14 DAYS. 06/19/19   Shamleffer, Melanie Crazier, MD  cyclobenzaprine (FLEXERIL) 10 MG tablet Take 1 tablet (10 mg total) by mouth 2 (two) times daily as needed for muscle spasms. 09/08/19   Quintella Reichert, MD  dicyclomine (BENTYL) 10 MG capsule Take 1 capsule (10 mg total) by mouth 3 (three) times daily before meals. Patient not taking: Reported on 10/17/2018 06/15/18   Ladene Artist, MD  glucose blood test strip Use as instructed 06/24/15   Gold[place] Circle, FNP  HYDROcodone-acetaminophen (NORCO/VICODIN) 5-325 MG tablet  Take 1 tablet by mouth every 6 (six) hours as needed. 09/08/19   Quintella Reichert, MD  ibuprofen (ADVIL) 800 MG tablet Take 1 tablet (800 mg total) by mouth every 8 (eight) hours as needed. 09/08/19   Quintella Reichert, MD  Insulin Pen Needle 32G X 4 MM MISC 1 Device by Does not apply route 2 (two) times daily. 03/23/19   Shamleffer, Melanie Crazier, MD  Lancets MISC Use one lancet per test to check blood sugar 1-4 times daily. Dx code:E11.9 12/27/15   Golden Circle, FNP  pioglitazone (ACTOS) 30 MG tablet Take 1 tablet (30 mg total) by mouth daily. 07/12/18 07/12/19  Shamleffer, Melanie Crazier, MD  pregabalin (LYRICA) 75 MG capsule Take  1 capsule (75 mg total) by mouth daily. Patient not taking: Reported on 07/12/2018 01/31/18   Pieter Partridge, DO    Allergies    Lisinopril  Review of Systems   Review of Systems  Respiratory: Positive for shortness of breath.   All other systems reviewed and are negative.   Physical Exam Updated Vital Signs BP (!) 161/80 (BP Location: Right Arm)    Pulse 68    Temp 98.2 F (36.8 C) (Oral)    Resp 17    Ht _0  (1.575 m)    Wt 135.6 kg    LMP 12/18/2014 (Exact Date)    SpO2 97%    BMI 54.69 kg/m   Physical Exam Vitals and nursing note reviewed.  Constitutional:      Appearance: She is well-developed.  HENT:     Head: Normocephalic and atraumatic.  Cardiovascular:     Rate and Rhythm: Normal rate and regular rhythm.     Heart sounds: No murmur heard.   Pulmonary:     Effort: Pulmonary effort is normal. No respiratory distress.     Breath sounds: Normal breath sounds.  Abdominal:     Palpations: Abdomen is soft.     Tenderness: There is no abdominal tenderness. There is no guarding or rebound.  Musculoskeletal:        General: No swelling or tenderness.     Comments: 2+ DP pulses bilaterally.  Skin:    General: Skin is warm and dry.  Neurological:     Mental Status: She is alert and oriented to person, place, and time.     Comments: 4/5  strength in BLE (pt states this is an ongoing issue).  Sensation to light touch intact in BLE.    Psychiatric:        Behavior: Behavior normal.     ED Results / Procedures / Treatments   Labs (all labs ordered are listed, but only abnormal results are displayed) Labs Reviewed  BASIC METABOLIC PANEL - Abnormal; Notable for the following components:      Result Value   Glucose, Bld 259 (*)    Calcium 8.7 (*)    All other components within normal limits  CBC - Abnormal; Notable for the following components:   RBC 5.19 (*)    All other components within normal limits  D-DIMER, QUANTITATIVE (NOT AT [facility])  TROPONIN I (HIGH SENSITIVITY)  TROPONIN I (HIGH SENSITIVITY)    EKG EKG Interpretation  Date/Time:  Friday September 08 2019 06:30:30 EDT Ventricular Rate:  86 PR Interval:  160 QRS Duration: 82 QT Interval:  390 QTC Calculation: 466 R Axis:   31 Text Interpretation: Normal sinus rhythm Normal ECG Confirmed by Quintella Reichert [phone]) on 09/08/2019 8:48:41 AM   Radiology DG Chest 2 View  Result Date: 09/08/2019 CLINICAL DATA:  Shortness of breath, worsening lower back and left leg pain, intermittent right chest pain EXAM: CHEST - 2 VIEW COMPARISON:  Radiograph 04/12/2018, CT 12/07/2011 FINDINGS: Some streaky atelectatic changes in the lung bases. No consolidation, features of edema, pneumothorax, or effusion. Stable cardiomegaly. No acute osseous or soft tissue abnormality. Degenerative changes are present in the imaged spine and shoulders. IMPRESSION: Some streaky atelectatic changes in the lung bases. No other acute cardiopulmonary abnormality. Stable cardiomegaly. Electronically Signed   By: Lovena Le M.D.   On: 09/08/2019 06:58   DG Lumbar Spine Complete  Result Date: 09/08/2019 CLINICAL DATA:  Low back pain. EXAM: LUMBAR SPINE - COMPLETE 4+ VIEW COMPARISON:  Lumbar spine radiographs 03/24/2018 FINDINGS: Five non rib-bearing lumbar type vertebral bodies are present.  Transitional T12 vertebral body is again noted. Progressive narrowing of the L4-5 lumbar disc is noted with endplate sclerotic changes. Moderate facet degenerative changes are present at L3-4, L4-5, and L5-S1 bilaterally, left greater than right. IMPRESSION: 1. Progressive degenerative disc disease at L4-5. 2. Moderate facet degenerative changes at L3-4, L4-5, and L5-S1 bilaterally, left greater than right. Electronically Signed   By: San Morelle M.D.   On: 09/08/2019 07:58   MR LUMBAR SPINE WO CONTRAST  Result Date: 09/08/2019 CLINICAL DATA:  Worsening low back and left leg pain for 1 month. Intermittent lower extremity weakness. EXAM: MRI LUMBAR SPINE WITHOUT CONTRAST TECHNIQUE: Multiplanar, multisequence MR imaging of the lumbar spine was performed. No intravenous contrast was administered. COMPARISON:  Lumbar spine radiographs 09/08/2019 FINDINGS: The study is moderately motion degraded. Segmentation: The lowest fully formed intervertebral disc space is designated L5-S1. There are no ribs at T12. Alignment:  4 mm retrolisthesis of L4 on L5. Vertebrae: No fracture, suspicious osseous lesion, or evidence of discitis. Moderate degenerative endplate changes at Z3-0, predominantly Modic type 2. Conus medullaris and cauda equina: Conus extends to the L1 level. Conus and cauda equina appear normal. Paraspinal and other soft tissues: Subcentimeter T2 hyperintense focus in the right kidney, likely a cyst. Disc levels: T10-11 and T11-12: Only imaged sagittally. Disc desiccation at both levels with at most minimal disc bulging and no evidence of significant stenosis. T12-L1 and L2-3: Negative. L3-4: Disc desiccation. Left eccentric disc bulging, a central disc protrusion, congenitally short pedicles, and mild facet hypertrophy result in mild spinal stenosis and mild left neural foraminal stenosis. Subcentimeter synovial cyst posterior to the left facet joint, not in a position to cause neural impingement.  L4-5: Disc desiccation and moderate disc space narrowing. Retrolisthesis with bulging uncovered disc, a broad posterior pseudo disc protrusion, congenitally short pedicles, and mild facet hypertrophy result in moderate spinal stenosis, severe bilateral lateral recess stenosis, and mild-to-moderate bilateral neural foraminal stenosis. Potential bilateral L5 nerve root impingement in the lateral recesses. L5-S1: Moderate to severe facet arthrosis with borderline lateral recess and neural foraminal stenosis bilaterally. No spinal stenosis. IMPRESSION: 1. Lumbar disc and facet degeneration most notable at L4-5 where there is grade 1 retrolisthesis, severe lateral recess stenosis, and mild-to-moderate neural foraminal stenosis. 2. Mild spinal stenosis and mild left neural foraminal stenosis at L3-4. 3. Moderate to severe facet arthrosis at L5-S1. Electronically Signed   By: Logan Bores M.D.   On: 09/08/2019 12:10   VAS [place] LOWER EXTREMITY VENOUS (DVT) (ONLY [facility] & [facility])  Result Date: 09/08/2019  Lower Venous DVTStudy Indications: Sciatic pain.  Limitations: Body habitus. Comparison Study: none Performing Technologist: June Leap RDMS, RVT  Examination Guidelines: A complete evaluation includes B-mode imaging, spectral Doppler, color Doppler, and power Doppler as needed of all accessible portions of each vessel. Bilateral testing is considered an integral part of a complete examination. Limited examinations for reoccurring indications may be performed as noted. The reflux portion of the exam is performed with the patient in reverse Trendelenburg.  +-----+---------------+---------+-----------+----------+--------------+  RIGHT Compressibility Phasicity Spontaneity Properties Thrombus Aging  +-----+---------------+---------+-----------+----------+--------------+  SFJ                   Yes       Yes                                    +-----+---------------+---------+-----------+----------+--------------+    +---------+---------------+---------+-----------+----------+--------------+  LEFT      Compressibility Phasicity Spontaneity Properties Thrombus Aging  +---------+---------------+---------+-----------+----------+--------------+  CFV       Full            Yes       Yes                                    +---------+---------------+---------+-----------+----------+--------------+  SFJ       Full                                                             +---------+---------------+---------+-----------+----------+--------------+  FV Prox   Full                                                             +---------+---------------+---------+-----------+----------+--------------+  FV Mid    Full                                                             +---------+---------------+---------+-----------+----------+--------------+  FV Distal Full                                                             +---------+---------------+---------+-----------+----------+--------------+  PFV       Full                                                             +---------+---------------+---------+-----------+----------+--------------+  POP       Full            Yes       Yes                                    +---------+---------------+---------+-----------+----------+--------------+  PTV       Full                                                             +---------+---------------+---------+-----------+----------+--------------+  PERO      Full                                                             +---------+---------------+---------+-----------+----------+--------------+  Summary: RIGHT: - No evidence of common femoral vein obstruction.  LEFT: - There is no evidence of deep vein thrombosis in the lower extremity.  - No cystic structure found in the popliteal fossa.  *See table(s) above for measurements and observations.    Preliminary     Procedures Procedures (including critical care time)  Medications  Ordered in ED Medications  HYDROcodone-acetaminophen (NORCO/VICODIN) 5-325 MG per tablet 1 tablet (1 tablet Oral Given 09/08/19 1057)  sodium chloride flush (NS) 0.9 % injection 3 mL (3 mLs Intravenous Given 09/08/19 0719)  ketorolac (TORADOL) 15 MG/ML injection 15 mg (15 mg Intravenous Given 09/08/19 1043)  LORazepam (ATIVAN) injection 1 mg (1 mg Intravenous Given 09/08/19 1056)    ED Course  I have reviewed the triage vital signs and the nursing notes.  Pertinent labs & imaging results that were available during my care of the patient were reviewed by me and considered in my medical decision making (see chart for details).    MDM Rules/Calculators/A&P                         Patient with history of diabetes here for evaluation of chest pain, shortness of breath as well as left lower extremity pain. She is non-toxic appearing on evaluation with good pulses in all four extremities. EKG without acute ischemic changes in troponin is negative. Presentation is not consistent with ACS. Doubt PE is D dimer and vascular ultrasound are negative. No evidence of pneumonia. On initial examination she had bilateral lower extremity weakness. On repeat assessment she does have five out of five strength and bilateral lower extremities. Given her radicular symptoms as well as her fluctuating examination an MRI was obtained. MRI does demonstrate advanced spinal stenosis. Discussed with patient findings of studies and recommendation for neurosurgical follow-up. Return precautions discussed.  Final Clinical Impression(s) / ED Diagnoses Final diagnoses:  Sciatica of left side    Rx / DC Orders ED Discharge Orders         Ordered    ibuprofen (ADVIL) 800 MG tablet  Every 8 hours PRN     Discontinue  Reprint     09/08/19 1423    cyclobenzaprine (FLEXERIL) 10 MG tablet  2 times daily PRN     Discontinue  Reprint     09/08/19 1423    HYDROcodone-acetaminophen (NORCO/VICODIN) 5-325 MG tablet  Every 6 hours PRN      Discontinue  Reprint     09/08/19 1423           Quintella Reichert, MD 09/08/19 1546

## 2019-09-08 NOTE — ED Notes (Signed)
Patient transported to X-ray 

## 2019-09-26 ENCOUNTER — Encounter: Payer: Self-pay | Admitting: Cardiology

## 2019-09-26 ENCOUNTER — Other Ambulatory Visit: Payer: Self-pay

## 2019-09-26 ENCOUNTER — Ambulatory Visit: Payer: BC Managed Care – PPO | Admitting: Cardiology

## 2019-09-26 VITALS — BP 133/76 | HR 87 | Resp 16 | Ht 62.0 in | Wt 302.0 lb

## 2019-09-26 DIAGNOSIS — R0600 Dyspnea, unspecified: Secondary | ICD-10-CM

## 2019-09-26 DIAGNOSIS — Z6841 Body Mass Index (BMI) 40.0 and over, adult: Secondary | ICD-10-CM

## 2019-09-26 DIAGNOSIS — Z8249 Family history of ischemic heart disease and other diseases of the circulatory system: Secondary | ICD-10-CM

## 2019-09-26 DIAGNOSIS — R0609 Other forms of dyspnea: Secondary | ICD-10-CM

## 2019-09-26 DIAGNOSIS — I10 Essential (primary) hypertension: Secondary | ICD-10-CM

## 2019-09-26 DIAGNOSIS — I639 Cerebral infarction, unspecified: Secondary | ICD-10-CM

## 2019-09-26 DIAGNOSIS — I6381 Other cerebral infarction due to occlusion or stenosis of small artery: Secondary | ICD-10-CM

## 2019-09-26 DIAGNOSIS — E1165 Type 2 diabetes mellitus with hyperglycemia: Secondary | ICD-10-CM

## 2019-09-26 DIAGNOSIS — R0602 Shortness of breath: Secondary | ICD-10-CM

## 2019-09-26 DIAGNOSIS — Z794 Long term (current) use of insulin: Secondary | ICD-10-CM

## 2019-09-26 NOTE — Progress Notes (Signed)
Date:  09/26/2019   ID:  Lindsay Gomez, DOB [birth year], MRN [id number]  PCP:  Jordan Hawks, NP  Cardiologist:  Rex Kras, DO, [facility] (established care 09/26/2019) Former Cardiology Providers: Larey Dresser, MD (2013)  REASON FOR CONSULT: Shortness of breath  REQUESTING PHYSICIAN:  Lucianne Lei, [address] [place],  [place] [address]  Chief Complaint  Patient presents with  . New Patient (Initial Visit)  . Shortness of Breath    HPI  Lindsay Gomez is a 49 y.o. female who presents to the office with a chief complaint of "shortness of breath evaluation." She is referred to the office at the request of Lucianne Lei, MD. Patient's past medical history and cardiovascular risk factors include: Benign essential hypertension, insulin-dependent diabetes mellitus type 2, hyperlipidemia, history of stroke, obesity due to excess calorie.   Patient is referred to the office at the request of her primary care provider for evaluation of shortness of breath after recently being seen in the ER on September 08, 2019.  Patient states that she has been having shortness of breath for approximately 1 month.  The symptoms are more pronounced when she lays flat at night.  She also complains of symptoms of paroxysmal nocturnal dyspnea, she chronically uses 2 pillows at night, and no significant lower extremity swelling.  She states that she also experiences effort related dyspnea which is chronic and stable.  No chest pain at rest or with effort related activities.  Her functional status is limited most likely due to body habitus and motivation.  Patient states that her symptoms of shortness of breath were very concerning and therefore went to the hospital on September 08, 2019 for further evaluation.  I independently reviewed the labs that were performed at the hospital which noted high sensitive troponins negative x2, and D-dimer was within normal limits.  She also had a lower extremity duplex which was  negative for deep venous thrombosis.  Premature coronary artery disease in family with dad who had MI in his 37s.   FUNCTIONAL STATUS: No structured exercise program or daily routine. But she has started to be more active since noticing her weight has gone up.    ALLERGIES: Allergies  Allergen Reactions  . Lisinopril Other (See Comments)    Cough     MEDICATION LIST PRIOR TO VISIT: Current Meds  Medication Sig  . aspirin (ASPIRIN 81) 81 MG EC tablet Take 1 tablet (81 mg total) by mouth daily. Swallow whole.  Marland Kitchen atorvastatin (LIPITOR) 20 MG tablet Take 20 mg by mouth at bedtime.  . Blood Glucose Monitoring Suppl (ONE TOUCH ULTRA MINI) w/Device KIT Use device to test blood sugars 1-4 times daily as instructed.  . calcium carbonate (TUMS - DOSED IN MG ELEMENTAL CALCIUM) 500 MG chewable tablet Chew 1-2 tablets by mouth as needed for indigestion or heartburn.  . Continuous Blood Gluc Sensor (FREESTYLE LIBRE 14 DAY SENSOR) MISC APPLY AS DIRECTED AND CHANGE EVERY 14 DAYS.  Marland Kitchen EDARBYCLOR 40-12.5 MG TABS Take 1 tablet by mouth daily.  Marland Kitchen glucose blood test strip Use as instructed  . HYDROcodone-acetaminophen (NORCO/VICODIN) 5-325 MG tablet Take 1 tablet by mouth every 6 (six) hours as needed.  Marland Kitchen ibuprofen (ADVIL) 800 MG tablet Take 1 tablet (800 mg total) by mouth every 8 (eight) hours as needed.  . insulin aspart protamine - aspart (NOVOLOG MIX 70/30 FLEXPEN) (70-30) 100 UNIT/ML FlexPen Inject 0.28 mLs (28 Units total) into the skin 2 (two) times  daily with a meal.  . Insulin Pen Needle 32G X 4 MM MISC 1 Device by Does not apply route 2 (two) times daily.  . Lancets MISC Use one lancet per test to check blood sugar 1-4 times daily. Dx code:E11.9  . pioglitazone (ACTOS) 30 MG tablet Take 1 tablet (30 mg total) by mouth daily.  . Semaglutide, 1 MG/DOSE, (OZEMPIC, 1 MG/DOSE,) 2 MG/1.5ML SOPN Inject 1 mg into the skin once a week. (Patient taking differently: Inject 1 mg into the skin once a week.  Tuesday)  . [DISCONTINUED] Azilsartan-Chlorthalidone (EDARBYCLOR) 40-25 MG TABS Take 1 tablet by mouth daily.     PAST MEDICAL HISTORY: Past Medical History:  Diagnosis Date  . Allergy    rhinitis  . Anemia   . Anxiety   . Diabetes mellitus without complication ([place])   . Enlarged heart   . Hyperlipidemia   . Hypertension   . Morbid obesity ([place])   . Shortness of breath dyspnea    with low iron    PAST SURGICAL HISTORY: Past Surgical History:  Procedure Laterality Date  . ABDOMINAL HYSTERECTOMY N/A 01/29/2015   Procedure: TOTAL ABDOMINAL HYSTERECTOMY ;  Surgeon: Ena Dawley, MD;  Location: [facility];  Service: Gynecology;  Laterality: N/A;  . BILATERAL SALPINGECTOMY Bilateral 01/29/2015   Procedure: BILATERAL SALPINGECTOMY;  Surgeon: Ena Dawley, MD;  Location: [facility];  Service: Gynecology;  Laterality: Bilateral;  . CESAREAN SECTION    . KNEE SURGERY  1992   ? arthroscopic/ right    FAMILY HISTORY: The patient family history includes Cancer in her paternal grandmother; Coronary artery disease in her father; Diabetes in her father and mother; Heart attack in her father; Heart failure in her father; Hyperlipidemia in an other family member; Hypertension in her mother and another family member.  SOCIAL HISTORY:  The patient  reports that she has never smoked. She has never used smokeless tobacco. She reports that she does not drink alcohol and does not use drugs.  REVIEW OF SYSTEMS: Review of Systems  Constitutional: Negative for chills and fever.  HENT: Negative for hoarse voice and nosebleeds.   Eyes: Negative for discharge, double vision and pain.  Cardiovascular: Positive for dyspnea on exertion and paroxysmal nocturnal dyspnea. Negative for chest pain, claudication, leg swelling, near-syncope, orthopnea, palpitations and syncope.  Respiratory: Positive for shortness of breath. Negative for hemoptysis.   Musculoskeletal: Negative for muscle cramps and myalgias.    Gastrointestinal: Negative for abdominal pain, constipation, diarrhea, hematemesis, hematochezia, melena, nausea and vomiting.  Neurological: Negative for dizziness and light-headedness.    PHYSICAL EXAM: Vitals with BMI 09/26/2019 09/08/2019 09/08/2019  Height 5' 2"  - -  Weight 302 lbs - -  BMI 30.09 - -  Systolic 233 007 622  Diastolic 76 80 65  Pulse 87 68 72   CONSTITUTIONAL: Appears older than stated age, hemodynamically stable, no acute distress.    SKIN: Skin is warm and dry. No rash noted. No cyanosis. No pallor. No jaundice HEAD: Normocephalic and atraumatic.  EYES: No scleral icterus MOUTH/THROAT: Moist oral membranes.  NECK: No JVD present. No thyromegaly noted. No carotid bruits  LYMPHATIC: No visible cervical adenopathy.  CHEST Normal respiratory effort. No intercostal retractions  LUNGS: Clear to auscultation bilaterally.  No stridor. No wheezes. No rales.  CARDIOVASCULAR: Regular rate and rhythm, positive S1-S2, no murmurs rubs or gallops appreciated. ABDOMINAL: Obese, soft, nontender, nondistended, positive bowel sounds in all 4 quadrants, no apparent ascites.  EXTREMITIES: No peripheral edema  HEMATOLOGIC: No  significant bruising NEUROLOGIC: Oriented to person, place, and time. Nonfocal. Normal muscle tone.  PSYCHIATRIC: Normal mood and affect. Normal behavior. Cooperative  Radiology:  MRI HEAD WITHOUT CONTRAST 02/2017: Subcentimeter acute infarction in the left lateral thalamus. 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  CARDIAC DATABASE: EKG: 09/26/2019: Normal sinus rhythm, 83 bpm, normal axis, without underlying ischemia or injury pattern.  Echocardiogram: 02/2017: LVEF 73-53%, grade 1 diastolic impairment, moderately dilated left atrium, no significant valvular heart disease.  Stress Testing: None  Heart Catheterization: None  Carotid duplex: 02/2017:  Right  Carotid: There is evidence in the right ICA of a 1-39% stenosis.  Left Carotid: There is evidence in the left ICA of a 1-39% stenosis.  Vertebrals: Both vertebral arteries were patent with antegrade flow.   [place] LE Venous Duplex 09/08/2019:   RIGHT: No evidence of common femoral vein obstruction.  LEFT:  There is no evidence of deep vein thrombosis in the lower extremity. No cystic structure found in the popliteal fossa.   LABORATORY DATA: CBC Latest Ref Rng & Units 09/08/2019 05/09/2018 04/12/2018  WBC 4.0 - 10.5 K/uL 6.1 7.5 5.5  Hemoglobin 12.0 - 15.0 g/dL 14.2 16.5(H) 14.8  Hematocrit 36 - 46 % 45.1 49.6(H) 48.0(H)  Platelets 150 - 400 K/uL 257 283.0 247    CMP Latest Ref Rng & Units 09/08/2019 12/16/2018 05/09/2018  Glucose 70 - 99 mg/dL 259(H) 222(H) 451(H)  BUN 6 - 20 mg/dL 10 8 9   Creatinine 0.44 - 1.00 mg/dL 0.80 0.70 0.77  Sodium 135 - 145 mmol/L 135 138 132(L)  Potassium 3.5 - 5.1 mmol/L 4.3 3.4(L) 3.7  Chloride 98 - 111 mmol/L 98 99 93(L)  CO2 22 - 32 mmol/L 25 32 30  Calcium 8.9 - 10.3 mg/dL 8.7(L) 9.3 9.6  Total Protein 6.0 - 8.3 g/dL - - -  Total Bilirubin 0.2 - 1.2 mg/dL - - -  Alkaline Phos 39 - 117 U/L - - -  AST 0 - 37 U/L - - -  ALT 0 - 35 U/L - - -    Lipid Panel     Component Value Date/Time   CHOL 188 03/03/2017 0301   TRIG 99 03/03/2017 0301   HDL 42 03/03/2017 0301   CHOLHDL 4.5 03/03/2017 0301   VLDL 20 03/03/2017 0301   LDLCALC 126 (H) 03/03/2017 0301   LDLDIRECT 140.4 09/24/2011 1013    BMP Recent Labs    12/16/18 1346 09/08/19 0719  NA 138 135  K 3.4* 4.3  CL 99 98  CO2 32 25  GLUCOSE 222* 259*  BUN 8 10  CREATININE 0.70 0.80  CALCIUM 9.3 8.7*  GFRNONAA  --  >60  GFRAA  --  >60    HEMOGLOBIN A1C Lab Results  Component Value Date   HGBA1C 8.5 (A) 12/16/2018   MPG 317.79 03/03/2017    IMPRESSION:    ICD-10-CM   1. SOB (shortness of breath)  R06.02 EKG 12-Lead    PCV ECHOCARDIOGRAM COMPLETE    B Nat Peptide  2. Dyspnea on  exertion  R06.00 PCV ECHOCARDIOGRAM COMPLETE    Pro b natriuretic peptide (BNP)    BASIC METABOLIC PANEL WITH GFR    Basic metabolic panel  3. Essential hypertension  I10   4. Type 2 diabetes mellitus with hyperglycemia, with long-term current use of insulin (HCC)  E11.65 CT CARDIAC SCORING   Z79.4   5. Long-term insulin use ([place])  Z79.4 CT CARDIAC SCORING  6. Thalamic stroke (HCC)  I63.9   7. Class 3 severe obesity due to excess calories with serious comorbidity and body mass index (BMI) of 50.0 to 59.9 in adult (HCC)  E66.01    Z68.43   8. Family history of premature CAD  Z82.49 CT CARDIAC SCORING     RECOMMENDATIONS: JANELIE GOLTZ is a 49 y.o. female whose past medical history and cardiac risk factors include: Benign essential hypertension, insulin-dependent diabetes mellitus type 2, hyperlipidemia, history of stroke, obesity due to excess calorie.   Shortness of breath/dyspnea on exertion:  Echocardiogram will be ordered to evaluate for structural heart disease and left ventricular systolic function.  Check BNP  Patient would benefit from nuclear stress test to evaluate for CAD; however, would recommend checking for coronary artery calcification given her multiple comorbid conditions and premature coronary artery disease in the family prior to deciding the next best test of choice.  Educated importance of a low-salt diet.  Patient is recommended to start walking at least 30 minutes a day 5 days a week as tolerated.  Family history of premature coronary artery disease: Check CT calcium score for further risk stratification   Benign essential hypertension: . Office blood pressure is at goal.  . Medication reconciled.  . Low salt diet recommended. A diet that is rich in fruits, vegetables, legumes, and low-fat dairy products and low in snacks, sweets, and meats (such as the Dietary Approaches to Stop Hypertension [DASH] diet).   Insulin-dependent diabetes mellitus type 2 with  complications of hyperglycemia: Currently managed per primary team.  History of thalamic stroke: Continue aspirin and statin therapy.  Educated on importance of secondary prevention.  Patient follows up with her primary care provider.  Of note, patient states that she does have atorvastatin at home but does not take it.  She is instructed to restart the medication if no contraindications.  Obesity, due to excess calories: . Body mass index is 55.24 kg/m. . I reviewed with the patient the importance of diet, regular physical activity/exercise, weight loss.   . Patient is educated on increasing physical activity gradually as tolerated.  With the goal of moderate intensity exercise for 30 minutes a day 5 days a week.  FINAL MEDICATION LIST END OF ENCOUNTER: No orders of the defined types were placed in this encounter.   Medications Discontinued During This Encounter  Medication Reason  . Azilsartan-Chlorthalidone (EDARBYCLOR) 23-36 MG TABS Duplicate  . cyclobenzaprine (FLEXERIL) 10 MG tablet Patient Preference  . dicyclomine (BENTYL) 10 MG capsule Patient Preference  . pregabalin (LYRICA) 75 MG capsule Patient Preference     Current Outpatient Medications:  .  aspirin (ASPIRIN 81) 81 MG EC tablet, Take 1 tablet (81 mg total) by mouth daily. Swallow whole., Disp: 30 tablet, Rfl: 1 .  atorvastatin (LIPITOR) 20 MG tablet, Take 20 mg by mouth at bedtime., Disp: , Rfl:  .  Blood Glucose Monitoring Suppl (ONE TOUCH ULTRA MINI) w/Device KIT, Use device to test blood sugars 1-4 times daily as instructed., Disp: 1 each, Rfl: 0 .  calcium carbonate (TUMS - DOSED IN MG ELEMENTAL CALCIUM) 500 MG chewable tablet, Chew 1-2 tablets by mouth as needed for indigestion or heartburn., Disp: , Rfl:  .  Continuous Blood Gluc Sensor (FREESTYLE LIBRE 14 DAY SENSOR) MISC, APPLY AS DIRECTED AND CHANGE EVERY 14 DAYS., Disp: 2 each, Rfl: 11 .  EDARBYCLOR 40-12.5 MG TABS, Take 1 tablet by mouth daily., Disp: , Rfl:  .  glucose blood test strip, Use as instructed, Disp: 100 each, Rfl: 12 .  HYDROcodone-acetaminophen (NORCO/VICODIN) 5-325 MG tablet, Take 1 tablet by mouth every 6 (six) hours as needed., Disp: 10 tablet, Rfl: 0 .  ibuprofen (ADVIL) 800 MG tablet, Take 1 tablet (800 mg total) by mouth every 8 (eight) hours as needed., Disp: 15 tablet, Rfl: 0 .  insulin aspart protamine - aspart (NOVOLOG MIX 70/30 FLEXPEN) (70-30) 100 UNIT/ML FlexPen, Inject 0.28 mLs (28 Units total) into the skin 2 (two) times daily with a meal., Disp: 18 mL, Rfl: 11 .  Insulin Pen Needle 32G X 4 MM MISC, 1 Device by Does not apply route 2 (two) times daily., Disp: 100 each, Rfl: 11 .  Lancets MISC, Use one lancet per test to check blood sugar 1-4 times daily. Dx code:E11.9, Disp: 100 each, Rfl: 5 .  pioglitazone (ACTOS) 30 MG tablet, Take 1 tablet (30 mg total) by mouth daily., Disp: 90 tablet, Rfl: 3 .  Semaglutide, 1 MG/DOSE, (OZEMPIC, 1 MG/DOSE,) 2 MG/1.5ML SOPN, Inject 1 mg into the skin once a week. (Patient taking differently: Inject 1 mg into the skin once a week. Tuesday), Disp: 3 pen, Rfl: 3  Orders Placed This Encounter  Procedures  . CT CARDIAC SCORING  . Pro b natriuretic peptide (BNP)  . BASIC METABOLIC PANEL WITH GFR  . B Nat Peptide  . Basic metabolic panel  . EKG 12-Lead  . PCV ECHOCARDIOGRAM COMPLETE   --Continue cardiac medications as reconciled in final medication list. --Return in about 6 weeks (around 11/07/2019) for re-evaluation of symptoms., review test results.. Or sooner if needed. --Continue follow-up with your primary care physician regarding the management of your other chronic comorbid conditions.  Patient's questions and concerns were addressed to her satisfaction. She voices understanding of the instructions provided during this encounter.   This note was created using a voice recognition software as a result there may be grammatical errors inadvertently enclosed that do not reflect the nature  of this encounter. Every attempt is made to correct such errors.  Total time spent: 60 minutes.  Rex Kras, [place], [facility]  Pager: [phone] Office: [phone]

## 2019-10-03 ENCOUNTER — Ambulatory Visit: Payer: BC Managed Care – PPO

## 2019-10-03 ENCOUNTER — Other Ambulatory Visit: Payer: Self-pay

## 2019-10-03 DIAGNOSIS — R0609 Other forms of dyspnea: Secondary | ICD-10-CM

## 2019-10-03 DIAGNOSIS — R0600 Dyspnea, unspecified: Secondary | ICD-10-CM

## 2019-10-03 DIAGNOSIS — R0602 Shortness of breath: Secondary | ICD-10-CM

## 2019-10-06 ENCOUNTER — Telehealth: Payer: Self-pay

## 2019-10-06 NOTE — Telephone Encounter (Signed)
Patient called and has seen her recent echocardiogram results on MyChart and did not understand them and is requesting a phone call (preferably), or a message on MyChart. Please advise.   Her callback number is:  [phone]

## 2019-10-16 ENCOUNTER — Other Ambulatory Visit: Payer: Self-pay | Admitting: Internal Medicine

## 2019-11-01 ENCOUNTER — Telehealth: Payer: Self-pay | Admitting: Cardiology

## 2019-11-08 ENCOUNTER — Ambulatory Visit: Payer: BC Managed Care – PPO | Admitting: Cardiology

## 2019-11-13 ENCOUNTER — Telehealth: Payer: Self-pay

## 2019-11-13 NOTE — Telephone Encounter (Signed)
Pt called stating she was returning your call. Please call pt [phone]

## 2019-11-27 ENCOUNTER — Other Ambulatory Visit: Payer: Self-pay | Admitting: Nurse Practitioner

## 2019-11-27 ENCOUNTER — Other Ambulatory Visit (HOSPITAL_COMMUNITY)
Admission: RE | Admit: 2019-11-27 | Discharge: 2019-11-27 | Disposition: A | Discharge status: Home / Self Care | Payer: BC Managed Care – PPO | Source: Ambulatory Visit | Attending: Nurse Practitioner | Admitting: Nurse Practitioner

## 2019-11-27 DIAGNOSIS — N898 Other specified noninflammatory disorders of vagina: Secondary | ICD-10-CM | POA: Insufficient documentation

## 2019-11-29 ENCOUNTER — Other Ambulatory Visit: Payer: Self-pay | Admitting: Nurse Practitioner

## 2019-11-29 ENCOUNTER — Ambulatory Visit
Admission: RE | Admit: 2019-11-29 | Discharge: 2019-11-29 | Disposition: A | Discharge status: Home / Self Care | Payer: BC Managed Care – PPO | Source: Ambulatory Visit | Attending: Nurse Practitioner | Admitting: Nurse Practitioner

## 2019-11-29 ENCOUNTER — Telehealth: Payer: Self-pay | Admitting: Cardiology

## 2019-11-29 DIAGNOSIS — Z712 Person consulting for explanation of examination or test findings: Secondary | ICD-10-CM

## 2019-11-29 DIAGNOSIS — M79605 Pain in left leg: Secondary | ICD-10-CM

## 2019-11-29 DIAGNOSIS — M79604 Pain in right leg: Secondary | ICD-10-CM

## 2019-11-29 LAB — MOLECULAR ANCILLARY ONLY
Bacterial Vaginitis (gardnerella): NEGATIVE
Candida Glabrata: NEGATIVE
Candida Vaginitis: POSITIVE — AB
Chlamydia: NEGATIVE
Comment: NEGATIVE
Comment: NEGATIVE
Comment: NEGATIVE
Comment: NEGATIVE
Comment: NEGATIVE
Comment: NORMAL
Neisseria Gonorrhea: NEGATIVE
Trichomonas: NEGATIVE

## 2019-11-29 NOTE — Telephone Encounter (Signed)
Telephone encounter:  Patient had called the office that she would like me to call her back in regards to going over the echo results instead of coming to the office to discuss her symptoms and review test results as the co-pays were high for her.  I called the patient at [phone].  Patient states that she continues to have shortness of breath with effort related activities.  She denies chest pain at rest or with effort related activities.  Echocardiogram notes preserved left ventricular systolic function with mild MR and TR which are new compared to prior study.  Patient also has multiple cardiovascular risk factors including history of a stroke, insulin-dependent diabetes mellitus, family history of CAD, hypertension.  She was recommended to undergo coronary artery calcification score for further risk stratification prior to ordering an ischemic evaluation based on calcium score.  Patient has not undergone a coronary artery calcification score as of yet.  I will have the office help her arrange this as well.   PCV ECHOCARDIOGRAM COMPLETE 10/03/2019  Narrative Echocardiogram 10/03/2019: Normal LV systolic function with visual EF 60-65%. Left ventricle cavity is normal in size. Moderate to severe left ventricular hypertrophy. Normal global wall motion. Indeterminate diastolic filling pattern, normal LAP. Calculated EF 64%. Visually, left atrial appears dilated. Mild (Grade I) mitral regurgitation. Mild tricuspid regurgitation. No evidence of pulmonary hypertension. Compared to prior study dated 02/2017: MR and TR are new.    ICD-10-CM   1. Encounter to discuss test results  Z71.2     Patient is made aware that I would like to see her back in the office after the calcium scoring is complete.  Total telephone encounter: 9 minutes.  Rex Kras, [place], [facility]  Pager: [phone] Office: [phone]

## 2019-11-29 NOTE — Telephone Encounter (Signed)
Spoke to the patient and answered her questions and went over the results.Please call her back and schedule calcium scoring either at [facility] or [facility] per patient's wishes.  I want to see her back after the calcium score is complete.

## 2019-11-29 NOTE — Telephone Encounter (Signed)
Spoke to the patient and answered her questions and went over the results.Please call her back and schedule calcium scoring either at [facility] or [facility] per patient's wishes.  I want to see her back after the calcium score is complete.

## 2019-12-06 NOTE — Telephone Encounter (Signed)
Please schedule

## 2019-12-06 NOTE — Telephone Encounter (Signed)
Patient called back said she has not completed test as she was not aware this is not covered by insurance.- she wants to know if there is another test she can have done that will be covered by insurance. I advised patient to call her insurance re: cal score as [facility] charges $99 and [facility] charges $199. Patient wants a phone call from [place] as she was never explained why cal score is needed and what "are you looking for" .   Thank you   -Leda Quail

## 2019-12-06 NOTE — Telephone Encounter (Signed)
Call pt 

## 2019-12-06 NOTE — Telephone Encounter (Signed)
VML for patient to call office back as she was due for a cal score and I dont see it being completed.    Lindsay Gomez

## 2019-12-07 NOTE — Telephone Encounter (Signed)
I did explain this to her in great detail during the office visit and over last phone encounter. She may have forgotten.  I also called her to review the test results as she couldn't make the office visit due to co-pay expense.  Please encourage her to make a follow up visit and bring in a list of her questions.  Cost is depended on her insurance and co-insurance as well as deductible all are which patient's responsibility.

## 2019-12-07 NOTE — Telephone Encounter (Signed)
Dr Terri Skains, I called the patient and the patient is very upset.- she said she does not want to come in and pay copay so that her questions can be answer. She had a fu apt on 8/25 of which was cx and as a courtesy she recived a phone call with echo results. I advised patient that was a courtesy call from the doctor but technically there is a charge every time a doctor talks to a patient. I told the patient she can sch a fu appt with me so that she can bring her questions and they can be answered in detailed. I told her copays are patients responsibility.  Patient was told she would get a call from my manager since I am unable to answer her medical questions.   @AD  can you please look at our telephone calls with this patient and follow up with her?   Thank you! -Leda Quail

## 2019-12-10 ENCOUNTER — Other Ambulatory Visit: Payer: Self-pay

## 2019-12-10 ENCOUNTER — Ambulatory Visit (HOSPITAL_COMMUNITY)
Admission: EM | Admit: 2019-12-10 | Discharge: 2019-12-10 | Disposition: A | Discharge status: Home / Self Care | Payer: BC Managed Care – PPO

## 2019-12-15 NOTE — Telephone Encounter (Signed)
Please follow up with this Lindsay Gomez. Thanks. ST

## 2020-07-25 ENCOUNTER — Other Ambulatory Visit: Payer: Self-pay | Admitting: Family Medicine

## 2020-07-25 DIAGNOSIS — M5416 Radiculopathy, lumbar region: Secondary | ICD-10-CM

## 2020-08-11 ENCOUNTER — Encounter (HOSPITAL_COMMUNITY): Payer: Self-pay | Admitting: Emergency Medicine

## 2020-08-11 ENCOUNTER — Emergency Department (HOSPITAL_COMMUNITY): Payer: BC Managed Care – PPO

## 2020-08-11 ENCOUNTER — Inpatient Hospital Stay (HOSPITAL_COMMUNITY)
Admission: EM | Admit: 2020-08-11 | Discharge: 2020-08-13 | DRG: 291 | Disposition: A | Discharge status: Home / Self Care | Payer: BC Managed Care – PPO | Attending: Internal Medicine | Admitting: Internal Medicine

## 2020-08-11 ENCOUNTER — Other Ambulatory Visit: Payer: Self-pay

## 2020-08-11 DIAGNOSIS — I081 Rheumatic disorders of both mitral and tricuspid valves: Secondary | ICD-10-CM | POA: Diagnosis present

## 2020-08-11 DIAGNOSIS — R0602 Shortness of breath: Secondary | ICD-10-CM

## 2020-08-11 DIAGNOSIS — F419 Anxiety disorder, unspecified: Secondary | ICD-10-CM | POA: Diagnosis present

## 2020-08-11 DIAGNOSIS — E876 Hypokalemia: Secondary | ICD-10-CM | POA: Diagnosis present

## 2020-08-11 DIAGNOSIS — Z79899 Other long term (current) drug therapy: Secondary | ICD-10-CM | POA: Diagnosis not present

## 2020-08-11 DIAGNOSIS — J309 Allergic rhinitis, unspecified: Secondary | ICD-10-CM | POA: Diagnosis present

## 2020-08-11 DIAGNOSIS — G4733 Obstructive sleep apnea (adult) (pediatric): Secondary | ICD-10-CM | POA: Diagnosis present

## 2020-08-11 DIAGNOSIS — E785 Hyperlipidemia, unspecified: Secondary | ICD-10-CM | POA: Diagnosis present

## 2020-08-11 DIAGNOSIS — Z9119 Patient's noncompliance with other medical treatment and regimen: Secondary | ICD-10-CM

## 2020-08-11 DIAGNOSIS — Z8673 Personal history of transient ischemic attack (TIA), and cerebral infarction without residual deficits: Secondary | ICD-10-CM | POA: Diagnosis not present

## 2020-08-11 DIAGNOSIS — I161 Hypertensive emergency: Secondary | ICD-10-CM

## 2020-08-11 DIAGNOSIS — Z794 Long term (current) use of insulin: Secondary | ICD-10-CM

## 2020-08-11 DIAGNOSIS — R0989 Other specified symptoms and signs involving the circulatory and respiratory systems: Secondary | ICD-10-CM | POA: Diagnosis present

## 2020-08-11 DIAGNOSIS — Z7984 Long term (current) use of oral hypoglycemic drugs: Secondary | ICD-10-CM | POA: Diagnosis not present

## 2020-08-11 DIAGNOSIS — Z8249 Family history of ischemic heart disease and other diseases of the circulatory system: Secondary | ICD-10-CM | POA: Diagnosis not present

## 2020-08-11 DIAGNOSIS — Z6841 Body Mass Index (BMI) 40.0 and over, adult: Secondary | ICD-10-CM

## 2020-08-11 DIAGNOSIS — I6523 Occlusion and stenosis of bilateral carotid arteries: Secondary | ICD-10-CM | POA: Diagnosis present

## 2020-08-11 DIAGNOSIS — Z7982 Long term (current) use of aspirin: Secondary | ICD-10-CM

## 2020-08-11 DIAGNOSIS — E1169 Type 2 diabetes mellitus with other specified complication: Secondary | ICD-10-CM

## 2020-08-11 DIAGNOSIS — Z888 Allergy status to other drugs, medicaments and biological substances status: Secondary | ICD-10-CM | POA: Diagnosis not present

## 2020-08-11 DIAGNOSIS — E119 Type 2 diabetes mellitus without complications: Secondary | ICD-10-CM

## 2020-08-11 DIAGNOSIS — E1165 Type 2 diabetes mellitus with hyperglycemia: Secondary | ICD-10-CM | POA: Diagnosis present

## 2020-08-11 DIAGNOSIS — I2510 Atherosclerotic heart disease of native coronary artery without angina pectoris: Secondary | ICD-10-CM | POA: Diagnosis present

## 2020-08-11 DIAGNOSIS — Z09 Encounter for follow-up examination after completed treatment for conditions other than malignant neoplasm: Secondary | ICD-10-CM

## 2020-08-11 DIAGNOSIS — Z91199 Patient's noncompliance with other medical treatment and regimen due to unspecified reason: Secondary | ICD-10-CM

## 2020-08-11 DIAGNOSIS — Z833 Family history of diabetes mellitus: Secondary | ICD-10-CM

## 2020-08-11 DIAGNOSIS — I5033 Acute on chronic diastolic (congestive) heart failure: Secondary | ICD-10-CM | POA: Diagnosis present

## 2020-08-11 DIAGNOSIS — I160 Hypertensive urgency: Secondary | ICD-10-CM | POA: Diagnosis present

## 2020-08-11 DIAGNOSIS — I110 Hypertensive heart disease with heart failure: Secondary | ICD-10-CM | POA: Diagnosis present

## 2020-08-11 LAB — CBC
HCT: 46.4 % — ABNORMAL HIGH (ref 36.0–46.0)
Hemoglobin: 15 g/dL (ref 12.0–15.0)
MCH: 27.6 pg (ref 26.0–34.0)
MCHC: 32.3 g/dL (ref 30.0–36.0)
MCV: 85.3 fL (ref 80.0–100.0)
Platelets: 262 10*3/uL (ref 150–400)
RBC: 5.44 MIL/uL — ABNORMAL HIGH (ref 3.87–5.11)
RDW: 13.4 % (ref 11.5–15.5)
WBC: 5.9 10*3/uL (ref 4.0–10.5)
nRBC: 0 % (ref 0.0–0.2)

## 2020-08-11 LAB — BASIC METABOLIC PANEL
Anion gap: 10 (ref 5–15)
BUN: 5 mg/dL — ABNORMAL LOW (ref 6–20)
CO2: 29 mmol/L (ref 22–32)
Calcium: 8.7 mg/dL — ABNORMAL LOW (ref 8.9–10.3)
Chloride: 99 mmol/L (ref 98–111)
Creatinine, Ser: 0.62 mg/dL (ref 0.44–1.00)
GFR, Estimated: >60 mL/min (ref >60)
Glucose, Bld: 237 mg/dL — ABNORMAL HIGH (ref 70–99)
Potassium: 3.3 mmol/L — ABNORMAL LOW (ref 3.5–5.1)
Sodium: 138 mmol/L (ref 135–145)

## 2020-08-11 LAB — BRAIN NATRIURETIC PEPTIDE: B Natriuretic Peptide: 90 pg/mL (ref 0.0–100.0)

## 2020-08-11 LAB — TROPONIN I (HIGH SENSITIVITY)
Troponin I (High Sensitivity): 8 ng/L (ref <18)
Troponin I (High Sensitivity): 7 ng/L (ref <18)

## 2020-08-11 LAB — GLUCOSE, CAPILLARY: Glucose-Capillary: 258 mg/dL — ABNORMAL HIGH (ref 70–99)

## 2020-08-11 LAB — CBG MONITORING, ED: Glucose-Capillary: 161 mg/dL — ABNORMAL HIGH (ref 70–99)

## 2020-08-11 LAB — HIV ANTIBODY (ROUTINE TESTING W REFLEX): HIV Screen 4th Generation wRfx: NONREACTIVE

## 2020-08-11 LAB — I-STAT BETA HCG BLOOD, ED (MC, WL, AP ONLY): I-stat hCG, quantitative: <5 m[IU]/mL (ref <5)

## 2020-08-11 MED ORDER — ASPIRIN EC 81 MG PO TBEC
81.0000 mg | DELAYED_RELEASE_TABLET | Freq: Every day | ORAL | Status: DC
  Administered 2020-08-12 – 2020-08-13 (×2): 81 mg via ORAL
  Filled 2020-08-11 (×2): qty 1

## 2020-08-11 MED ORDER — IRBESARTAN 300 MG PO TABS
150.0000 mg | ORAL_TABLET | Freq: Every day | ORAL | Status: DC
  Administered 2020-08-12 – 2020-08-13 (×2): 150 mg via ORAL
  Filled 2020-08-11 (×3): qty 1

## 2020-08-11 MED ORDER — HYDRALAZINE HCL 20 MG/ML IJ SOLN
10.0000 mg | INTRAMUSCULAR | Status: DC | PRN
  Administered 2020-08-11 – 2020-08-12 (×2): 10 mg via INTRAVENOUS
  Filled 2020-08-11 (×3): qty 1

## 2020-08-11 MED ORDER — INSULIN ASPART 100 UNIT/ML IJ SOLN
0.0000 [IU] | Freq: Three times a day (TID) | INTRAMUSCULAR | Status: DC
  Administered 2020-08-11: 3 [IU] via SUBCUTANEOUS
  Administered 2020-08-12: 8 [IU] via SUBCUTANEOUS
  Administered 2020-08-12: 3 [IU] via SUBCUTANEOUS
  Administered 2020-08-12: 5 [IU] via SUBCUTANEOUS
  Administered 2020-08-13: 11 [IU] via SUBCUTANEOUS
  Administered 2020-08-13: 5 [IU] via SUBCUTANEOUS

## 2020-08-11 MED ORDER — ZOLPIDEM TARTRATE 5 MG PO TABS
5.0000 mg | ORAL_TABLET | Freq: Once | ORAL | Status: AC
  Administered 2020-08-11: 5 mg via ORAL
  Filled 2020-08-11: qty 1

## 2020-08-11 MED ORDER — ATORVASTATIN CALCIUM 10 MG PO TABS
20.0000 mg | ORAL_TABLET | Freq: Every day | ORAL | Status: DC
  Administered 2020-08-11 – 2020-08-12 (×2): 20 mg via ORAL
  Filled 2020-08-11 (×2): qty 2

## 2020-08-11 MED ORDER — FUROSEMIDE 10 MG/ML IJ SOLN
20.0000 mg | Freq: Two times a day (BID) | INTRAMUSCULAR | Status: DC
  Administered 2020-08-12 – 2020-08-13 (×3): 20 mg via INTRAVENOUS
  Filled 2020-08-11 (×3): qty 2

## 2020-08-11 MED ORDER — SODIUM CHLORIDE 0.9 % IV SOLN: 250.0000 mL | INTRAVENOUS | Status: DC | PRN

## 2020-08-11 MED ORDER — INSULIN GLARGINE 100 UNIT/ML ~~LOC~~ SOLN
15.0000 [IU] | Freq: Every day | SUBCUTANEOUS | Status: DC
  Administered 2020-08-11 – 2020-08-12 (×2): 15 [IU] via SUBCUTANEOUS
  Filled 2020-08-11 (×3): qty 0.15

## 2020-08-11 MED ORDER — ENOXAPARIN SODIUM 40 MG/0.4ML IJ SOSY
40.0000 mg | PREFILLED_SYRINGE | INTRAMUSCULAR | Status: DC
  Administered 2020-08-11 – 2020-08-12 (×2): 40 mg via SUBCUTANEOUS
  Filled 2020-08-11 (×2): qty 0.4

## 2020-08-11 MED ORDER — FUROSEMIDE 10 MG/ML IJ SOLN
20.0000 mg | Freq: Once | INTRAMUSCULAR | Status: AC
  Administered 2020-08-11: 20 mg via INTRAVENOUS
  Filled 2020-08-11: qty 2

## 2020-08-11 MED ORDER — SODIUM CHLORIDE 0.9% FLUSH
3.0000 mL | Freq: Two times a day (BID) | INTRAVENOUS | Status: DC
  Administered 2020-08-11 – 2020-08-13 (×4): 3 mL via INTRAVENOUS

## 2020-08-11 MED ORDER — ACETAMINOPHEN 325 MG PO TABS
650.0000 mg | ORAL_TABLET | ORAL | Status: DC | PRN
  Administered 2020-08-11: 650 mg via ORAL
  Filled 2020-08-11: qty 2

## 2020-08-11 MED ORDER — ONDANSETRON HCL 4 MG/2ML IJ SOLN: 4.0000 mg | Freq: Four times a day (QID) | INTRAMUSCULAR | Status: DC | PRN

## 2020-08-11 MED ORDER — SODIUM CHLORIDE 0.9% FLUSH: 3.0000 mL | INTRAVENOUS | Status: DC | PRN

## 2020-08-11 MED ORDER — POTASSIUM CHLORIDE CRYS ER 20 MEQ PO TBCR
40.0000 meq | EXTENDED_RELEASE_TABLET | ORAL | Status: AC
  Administered 2020-08-11: 40 meq via ORAL
  Filled 2020-08-11: qty 2

## 2020-08-11 NOTE — ED Notes (Signed)
Ja Pistole daughter [phone] would like an update on the patient

## 2020-08-11 NOTE — ED Triage Notes (Signed)
C/o SOB x 2 weeks.  Denies pain or cough.  Seen by PCP and put on diuretic without improvement.

## 2020-08-11 NOTE — ED Notes (Signed)
Report called to the rn on 3e

## 2020-08-11 NOTE — ED Provider Notes (Signed)
[facility] EMERGENCY DEPARTMENT Provider Note   CSN: [id number] Arrival date & time: 08/11/20  1122     History No chief complaint on file.   Lindsay Gomez is a 50 y.o. female who presents emergency department chief complaint of shortness of breath.  She has a past medical history of diabetes, hypertension, hyperlipidemia, obesity, anemia and shortness of breath.  Patient states that for the past 2 weeks she has had progressively worsening shortness of breath.  About 2 weeks ago she went from feeling fine to having exertional dyspnea with things like brushing her teeth and walking around the house.  Over the past few days she has had severe shortness of breath even at rest and with any minimal amount of exertion.  She has noticed a 7 pound weight gain over the past week.  The patient has had some increased sodium intake with her diet this week however she also states that the last time she saw her PCP because she had to visits with normal blood pressure she was taken off of her Edarbyclor.  She denies orthopnea or PND and denies any significant peripheral edema.  Patient is only on spironolactone.  She is notably very hypertensive here.  She denies melena, hematochezia.  She is status post hysterectomy.  She did have a cough and runny nose 2 weeks ago which resolved.  She denies a history of DVT or pulmonary embolus.  HPI     Past Medical History:  Diagnosis Date  . Allergy    rhinitis  . Anemia   . Anxiety   . Diabetes mellitus without complication ([place])   . Enlarged heart   . Hyperlipidemia   . Hypertension   . Morbid obesity ([place])   . Shortness of breath dyspnea    with low iron    Patient Active Problem List   Diagnosis Date Noted  . Non-compliance 05/13/2018  . Chronic bilateral low back pain with bilateral sciatica 03/25/2018  . Hepatic steatosis 08/16/2017  . 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 Cerebrovascular accident (CVA) ([place]) 03/14/2017  .  Hypocalcemia 03/03/2017  . Cerebral thrombosis with cerebral infarction 03/03/2017  . Major depressive disorder, single episode, moderate ([place]) 10/28/2015  . Microcytic hypochromic anemia 07/29/2015  . Type 2 diabetes mellitus ([place]) 06/24/2015  . Depression 06/24/2015  . Obstructive sleep apnea 06/17/2015  . Anxiety and depression 04/24/2015  . Pre-op evaluation 01/09/2015  . Hyperlipidemia 01/06/2013  . Right lumbar radiculopathy 10/26/2012  . ANA positive 09/14/2012  . LVH (left ventricular hypertrophy) 12/08/2011  . Menorrhagia 09/24/2011  . Routine general medical examination at a health care facility 09/24/2011  . Carpal tunnel syndrome of right wrist 09/18/2010  . Hypokalemia 09/18/2010  . Morbid obesity ([place]) 11/29/2008  . Iron deficiency anemia 11/28/2008  . Essential hypertension 11/28/2008  . ALLERGIC RHINITIS 11/28/2008    Past Surgical History:  Procedure Laterality Date  . ABDOMINAL HYSTERECTOMY N/A 01/29/2015   Procedure: TOTAL ABDOMINAL HYSTERECTOMY ;  Surgeon: Ena Dawley, MD;  Location: [facility];  Service: Gynecology;  Laterality: N/A;  . BILATERAL SALPINGECTOMY Bilateral 01/29/2015   Procedure: BILATERAL SALPINGECTOMY;  Surgeon: Ena Dawley, MD;  Location: [facility];  Service: Gynecology;  Laterality: Bilateral;  . CESAREAN SECTION    . KNEE SURGERY  1992   ? arthroscopic/ right     OB History    Gravida  3   Para  1   Term      Preterm  AB  1   Living  2     SAB  1   IAB      Ectopic      Multiple      Live Births              Family History  Problem Relation Age of Onset  . Hypertension Mother   . Diabetes Mother   . Coronary artery disease Father   . Heart failure Father   . Heart attack Father   . Diabetes Father   . Hypertension Other   . Hyperlipidemia Other   . Cancer Paternal Grandmother        unknown type?  . Esophageal cancer Neg Hx   . Colon cancer Neg Hx   . Pancreatic cancer Neg Hx   . Stomach cancer  Neg Hx     Social History   Tobacco Use  . Smoking status: Never Smoker  . Smokeless tobacco: Never Used  Vaping Use  . Vaping Use: Never used  Substance Use Topics  . Alcohol use: No  . Drug use: No    Home Medications Prior to Admission medications   Medication Sig Start Date End Date Taking? Authorizing Provider  aspirin (ASPIRIN 81) 81 MG EC tablet Take 1 tablet (81 mg total) by mouth daily. Swallow whole. 03/24/18   Lance Sell, NP  atorvastatin (LIPITOR) 20 MG tablet Take 20 mg by mouth at bedtime.    [provider]  Blood Glucose Monitoring Suppl (ONE TOUCH ULTRA MINI) w/Device KIT Use device to test blood sugars 1-4 times daily as instructed. 06/24/15   Golden Circle, FNP  calcium carbonate (TUMS - DOSED IN MG ELEMENTAL CALCIUM) 500 MG chewable tablet Chew 1-2 tablets by mouth as needed for indigestion or heartburn.    [provider]  Continuous Blood Gluc Sensor (FREESTYLE LIBRE 14 DAY SENSOR) MISC APPLY AS DIRECTED AND CHANGE EVERY 14 DAYS. 06/19/19   Shamleffer, Melanie Crazier, MD  EDARBYCLOR 40-12.5 MG TABS Take 1 tablet by mouth daily. 09/16/19   [provider]  glucose blood test strip Use as instructed 06/24/15   Golden Circle, FNP  HYDROcodone-acetaminophen (NORCO/VICODIN) 5-325 MG tablet Take 1 tablet by mouth every 6 (six) hours as needed. 09/08/19   Quintella Reichert, MD  ibuprofen (ADVIL) 800 MG tablet Take 1 tablet (800 mg total) by mouth every 8 (eight) hours as needed. 09/08/19   Quintella Reichert, MD  insulin aspart protamine - aspart (NOVOLOG MIX 70/30 FLEXPEN) (70-30) 100 UNIT/ML FlexPen Inject 0.28 mLs (28 Units total) into the skin 2 (two) times daily with a meal. 03/23/19   Shamleffer, Melanie Crazier, MD  Insulin Pen Needle 32G X 4 MM MISC 1 Device by Does not apply route 2 (two) times daily. 03/23/19   Shamleffer, Melanie Crazier, MD  Lancets MISC Use one lancet per test to check blood sugar 1-4 times daily. Dx code:E11.9  12/27/15   Golden Circle, FNP  pioglitazone (ACTOS) 30 MG tablet TAKE 1 TABLET ONCE DAILY. 10/16/19   Shamleffer, Melanie Crazier, MD  Semaglutide, 1 MG/DOSE, (OZEMPIC, 1 MG/DOSE,) 2 MG/1.5ML SOPN Inject 1 mg into the skin once a week. Patient taking differently: Inject 1 mg into the skin once a week. Tuesday 12/16/18   Shamleffer, Melanie Crazier, MD    Allergies    Lisinopril  Review of Systems   Review of Systems Ten systems reviewed and are negative for acute change, except as noted in the HPI.  Physical Exam Updated Vital Signs BP (!) 191/116   Pulse 80   Temp (!) 97.4 F (36.3 C)   Resp 14   LMP 12/18/2014 (Exact Date)   SpO2 100%   Physical Exam Vitals and nursing note reviewed.  Constitutional:      General: She is not in acute distress.    Appearance: She is well-developed. She is not diaphoretic.  HENT:     Head: Normocephalic and atraumatic.  Eyes:     General: No scleral icterus.    Conjunctiva/sclera: Conjunctivae normal.  Cardiovascular:     Rate and Rhythm: Normal rate and regular rhythm.     Heart sounds: Normal heart sounds. No murmur heard. No friction rub. No gallop.   Pulmonary:     Effort: Pulmonary effort is normal. No respiratory distress.     Breath sounds: Normal breath sounds.  Abdominal:     General: Bowel sounds are normal. There is no distension.     Palpations: Abdomen is soft. There is no mass.     Tenderness: There is no abdominal tenderness. There is no guarding.  Musculoskeletal:     Cervical back: Normal range of motion.  Skin:    General: Skin is warm and dry.  Neurological:     Mental Status: She is alert and oriented to person, place, and time.  Psychiatric:        Behavior: Behavior normal.     ED Results / Procedures / Treatments   Labs (all labs ordered are listed, but only abnormal results are displayed) Labs Reviewed  BASIC METABOLIC PANEL - Abnormal; Notable for the following components:      Result Value    Potassium 3.3 (*)    Glucose, Bld 237 (*)    BUN 5 (*)    Calcium 8.7 (*)    All other components within normal limits  CBC - Abnormal; Notable for the following components:   RBC 5.44 (*)    HCT 46.4 (*)    All other components within normal limits  BRAIN NATRIURETIC PEPTIDE  I-STAT BETA HCG BLOOD, ED ([facility], [facility], [facility] ONLY)  TROPONIN I (HIGH SENSITIVITY)  TROPONIN I (HIGH SENSITIVITY)    EKG EKG Interpretation  Date/Time:  Sunday Aug 11 2020 11:26:40 EDT Ventricular Rate:  90 PR Interval:  154 QRS Duration: 90 QT Interval:  394 QTC Calculation: 481 R Axis:   43 Text Interpretation: Normal sinus rhythm Prolonged QT Abnormal ECG when compared to prior, similar appearance. No sTEMI Confirmed by Antony Blackbird [phone]) on 08/11/2020 1:11:38 PM   Radiology DG Chest 2 View  Result Date: 08/11/2020 CLINICAL DATA:  Shortness of breath. EXAM: CHEST - 2 VIEW COMPARISON:  June 01, 2020 FINDINGS: Enlarged cardiac silhouette. There is no evidence of focal airspace consolidation, pleural effusion or pneumothorax. Mild interstitial pulmonary edema. Osseous structures are without acute abnormality. Soft tissues are grossly normal. IMPRESSION: 1. Enlarged cardiac silhouette, similar to the prior exam. 2. Mild interstitial pulmonary edema. Electronically Signed   By: Fidela Salisbury M.D.   On: 08/11/2020 11:47    Procedures .Critical Care Performed by: Margarita Mail, PA-C Authorized by: Margarita Mail, PA-C   Critical care provider statement:    Critical care time (minutes):  45   Critical care time was exclusive of:  Separately billable procedures and treating other patients   Critical care was necessary to treat or prevent imminent or life-threatening deterioration of the following conditions:  Cardiac failure (Hypertensive emergency)   Critical care was  time spent personally by me on the following activities:  Discussions with consultants, evaluation of patient's response to treatment,  examination of patient, ordering and performing treatments and interventions, ordering and review of laboratory studies, ordering and review of radiographic studies, pulse oximetry, re-evaluation of patient's condition, obtaining history from patient or surrogate and review of old charts     Medications Ordered in ED Medications - No data to display  ED Course  I have reviewed the triage vital signs and the nursing notes.  Pertinent labs & imaging results that were available during my care of the patient were reviewed by me and considered in my medical decision making (see chart for details).    MDM Rules/Calculators/A&P                          50 year old female here with shortness of breath. The emergent differential diagnosis for shortness of breath includes, but is not limited to, Pulmonary edema, bronchoconstriction, Pneumonia, Pulmonary embolism, Pneumotherax/ Hemothorax, Dysrythmia, ACS.  I ordered and reviewed labs include CBC without significant abnormality, troponin within normal limits, BMP with mild hypokalemia kalemia.  Negative pregnancy test.  BNP pending.  I ordered and reviewed two-view chest x-ray which shows mild pulmonary edema and cardiomegaly.  EKG shows normal sinus rhythm with prolonged QT at a rate of 90. I believe patient currently represents hypertensive emergency given by her constantly elevated blood pressures with pulmonary edema.  I gave her 20 of IV Lasix and she put out almost a liter of urine.  She was able to ambulate without hypoxia but became very winded.  Patient does report that they tried to give her her medication but it was a prior authorization issue and although she was given the medications separately to purchase she lost this medications and if this has not been taking any blood pressure medicine.  Patient will be admitted by Dr. Tamala Julian.  Systolic pressure was improved after Lasix. Final Clinical Impression(s) / ED Diagnoses Final diagnoses:  None     Rx / DC Orders ED Discharge Orders    None       Margarita Mail, PA-C 08/11/20 1717    Tegeler, Gwenyth Allegra, MD 08/12/20 [phone]

## 2020-08-11 NOTE — ED Notes (Signed)
Pt ambulatory to restroom. Steady on feet with stated increased shortness of breath

## 2020-08-11 NOTE — H&P (Signed)
History and Physical    Lindsay Gomez:[id number] DOB: [birth year] DOA: 08/11/2020  Referring MD/NP/PA: Margarita Mail, PA-C PCP: [name], DO  Patient coming from: Home  Chief Complaint: Shortness of breath  I have personally briefly reviewed patient's old medical records in [place]   HPI: Lindsay Gomez is a 50 y.o. female with medical history significant of hypertension, hyperlipidemia, diastolic CHF, diabetes mellitus type 2, anemia presents with complaints of progressively worsening shortness of breath over the last 2 weeks.  Initially patient only noted symptoms while she was up and exerting herself, however over the last week she has noticed that she is having difficulty breathing even at rest.  Denies any significant fever, leg swelling, orthopnea,  cough, chest pain, nausea, vomiting, or diarrhea.  She had noticed a 7 pound weight gain over the last week.  Her blood pressures had been well controlled on Edarbyclor, but was taken off of the medication after insurance no longer would pay for it.  Her primary care provider had been in the process of trying to get prior authorization for this medicine.  She had followed up with her primary care provider last week and had been started on spironolactone.  She was given alternatives for the Edarbyclor, but misplaced them.  ED Course: Upon admission into the emergency department patient was seen to be afebrile with respirations 14-25, blood pressures elevated up to 218/117, and O2 saturations maintained on room air.  Labs significant for potassium 3.3, glucose 237, and high-sensitivity troponins negative x2.  BNP was pending.  Chest x-ray significant for enlarged cardiac silhouette with mild interstitial pulmonary edema.  Patient had been given Lasix 20 mg IV and had urinated 1 L.  Review of Systems  Constitutional: Positive for malaise/fatigue. Negative for fever.  HENT: Negative for congestion and nosebleeds.   Eyes:  Negative for photophobia and pain.  Respiratory: Positive for shortness of breath. Negative for cough.   Cardiovascular: Negative for chest pain and leg swelling.  Gastrointestinal: Negative for abdominal pain, diarrhea, nausea and vomiting.  Genitourinary: Negative for dysuria and hematuria.  Musculoskeletal: Negative for falls.  Skin: Negative for rash.  Neurological: Negative for focal weakness and loss of consciousness.  Psychiatric/Behavioral: Negative for substance abuse. The patient is not nervous/anxious.     Past Medical History:  Diagnosis Date  . Allergy    rhinitis  . Anemia   . Anxiety   . Diabetes mellitus without complication ([place])   . Enlarged heart   . Hyperlipidemia   . Hypertension   . Morbid obesity ([place])   . Shortness of breath dyspnea    with low iron    Past Surgical History:  Procedure Laterality Date  . ABDOMINAL HYSTERECTOMY N/A 01/29/2015   Procedure: TOTAL ABDOMINAL HYSTERECTOMY ;  Surgeon: Ena Dawley, MD;  Location: [facility];  Service: Gynecology;  Laterality: N/A;  . BILATERAL SALPINGECTOMY Bilateral 01/29/2015   Procedure: BILATERAL SALPINGECTOMY;  Surgeon: Ena Dawley, MD;  Location: [facility];  Service: Gynecology;  Laterality: Bilateral;  . CESAREAN SECTION    . KNEE SURGERY  1992   ? arthroscopic/ right     reports that she has never smoked. She has never used smokeless tobacco. She reports that she does not drink alcohol and does not use drugs.  Allergies  Allergen Reactions  . Lisinopril Other (See Comments)    Cough     Family History  Problem Relation Age of Onset  . Hypertension Mother   . Diabetes  Mother   . Coronary artery disease Father   . Heart failure Father   . Heart attack Father   . Diabetes Father   . Hypertension Other   . Hyperlipidemia Other   . Cancer Paternal Grandmother        unknown type?  . Esophageal cancer Neg Hx   . Colon cancer Neg Hx   . Pancreatic cancer Neg Hx   . Stomach cancer Neg Hx      Prior to Admission medications   Medication Sig Start Date End Date Taking? Authorizing Provider  aspirin (ASPIRIN 81) 81 MG EC tablet Take 1 tablet (81 mg total) by mouth daily. Swallow whole. 03/24/18  Yes Lance Sell, NP  atorvastatin (LIPITOR) 20 MG tablet Take 20 mg by mouth at bedtime.   Yes [provider]  calcium carbonate (TUMS - DOSED IN MG ELEMENTAL CALCIUM) 500 MG chewable tablet Chew 1-2 tablets by mouth as needed for indigestion or heartburn.   Yes [provider]  insulin degludec (TRESIBA FLEXTOUCH) 100 UNIT/ML FlexTouch Pen Inject 15 Units into the skin at bedtime.   Yes [provider]  OZEMPIC, 1 MG/DOSE, 4 MG/3ML SOPN Inject 1 mg into the skin once a week. 03/02/20  Yes [provider]  pioglitazone (ACTOS) 30 MG tablet TAKE 1 TABLET ONCE DAILY. 10/16/19  Yes Shamleffer, Melanie Crazier, MD  spironolactone (ALDACTONE) 50 MG tablet Take 50 mg by mouth daily. 08/05/20  Yes [provider]  Blood Glucose Monitoring Suppl (ONE TOUCH ULTRA MINI) w/Device KIT Use device to test blood sugars 1-4 times daily as instructed. 06/24/15   Golden Circle, FNP  Continuous Blood Gluc Sensor (FREESTYLE LIBRE 14 DAY SENSOR) MISC APPLY AS DIRECTED AND CHANGE EVERY 14 DAYS. 06/19/19   Shamleffer, Melanie Crazier, MD  glucose blood test strip Use as instructed 06/24/15   Golden Circle, FNP  HYDROcodone-acetaminophen (NORCO/VICODIN) 5-325 MG tablet Take 1 tablet by mouth every 6 (six) hours as needed. Patient not taking: No sig reported 09/08/19   Quintella Reichert, MD  ibuprofen (ADVIL) 800 MG tablet Take 1 tablet (800 mg total) by mouth every 8 (eight) hours as needed. Patient not taking: No sig reported 09/08/19   Quintella Reichert, MD  insulin aspart protamine - aspart (NOVOLOG MIX 70/30 FLEXPEN) (70-30) 100 UNIT/ML FlexPen Inject 0.28 mLs (28 Units total) into the skin 2 (two) times daily with a meal. Patient not taking: Reported on 08/11/2020  03/23/19   Shamleffer, Melanie Crazier, MD  Insulin Pen Needle 32G X 4 MM MISC 1 Device by Does not apply route 2 (two) times daily. 03/23/19   Shamleffer, Melanie Crazier, MD  Lancets MISC Use one lancet per test to check blood sugar 1-4 times daily. Dx code:E11.9 12/27/15   Golden Circle, FNP  olmesartan (BENICAR) 20 MG tablet Take 20 mg by mouth daily. 06/26/20   [provider]  Semaglutide, 1 MG/DOSE, (OZEMPIC, 1 MG/DOSE,) 2 MG/1.5ML SOPN Inject 1 mg into the skin once a week. Patient not taking: Reported on 08/11/2020 12/16/18   Shamleffer, Melanie Crazier, MD    Physical Exam:  Constitutional: Obese middle-aged female currently in no acute distress Vitals:   08/11/20 1330 08/11/20 1345 08/11/20 1457 08/11/20 1506  BP: (!) 218/117 (!) 191/109 (!) 212/127 (!) 192/120  Pulse: 81 74 82   Resp: (!) 25 (!) 24 (!) 23   Temp:      SpO2: 100% 100% 100%    Eyes: PERRL, lids and conjunctivae  normal ENMT: Mucous membranes are moist. Posterior pharynx clear of any exudate or lesions.  Neck: normal, supple, no masses, no thyromegaly.  JVD one third to the angle of the jaw Respiratory: Normal respiratory effort with positive intermittent crackles appreciated in the lower lung fields Cardiovascular: Regular rate and rhythm, no murmurs / rubs / gallops.  Trace lower extremity edema. 2+ pedal pulses. No carotid bruits.  Abdomen: no tenderness, no masses palpated. No hepatosplenomegaly. Bowel sounds positive.  Musculoskeletal: no clubbing / cyanosis. No joint deformity upper and lower extremities. Good ROM, no contractures. Normal muscle tone.  Skin: no rashes, lesions, ulcers. No induration Neurologic: CN 2-12 grossly intact. Sensation intact, DTR normal. Strength 5/5 in all 4.  Psychiatric: Normal judgment and insight. Alert and oriented x 3. Normal mood.     Labs on Admission: I have personally reviewed following labs and imaging studies  CBC: Recent Labs  Lab 08/11/20 1212  WBC  5.9  HGB 15.0  HCT 46.4*  MCV 85.3  PLT 161   Basic Metabolic Panel: Recent Labs  Lab 08/11/20 1212  NA 138  K 3.3*  CL 99  CO2 29  GLUCOSE 237*  BUN 5*  CREATININE 0.62  CALCIUM 8.7*   GFR: CrCl cannot be calculated (Unknown ideal weight.). Liver Function Tests: No results for input(s): AST, ALT, ALKPHOS, BILITOT, PROT, ALBUMIN in the last 168 hours. No results for input(s): LIPASE, AMYLASE in the last 168 hours. No results for input(s): AMMONIA in the last 168 hours. Coagulation Profile: No results for input(s): INR, PROTIME in the last 168 hours. Cardiac Enzymes: No results for input(s): CKTOTAL, CKMB, CKMBINDEX, TROPONINI in the last 168 hours. BNP (last 3 results) No results for input(s): PROBNP in the last 8760 hours. HbA1C: No results for input(s): HGBA1C in the last 72 hours. CBG: No results for input(s): GLUCAP in the last 168 hours. Lipid Profile: No results for input(s): CHOL, HDL, LDLCALC, TRIG, CHOLHDL, LDLDIRECT in the last 72 hours. Thyroid Function Tests: No results for input(s): TSH, T4TOTAL, FREET4, T3FREE, THYROIDAB in the last 72 hours. Anemia Panel: No results for input(s): VITAMINB12, FOLATE, FERRITIN, TIBC, IRON, RETICCTPCT in the last 72 hours. Urine analysis:    Component Value Date/Time   COLORURINE YELLOW 09/07/2017 0526   APPEARANCEUR CLEAR 09/07/2017 0526   LABSPEC 1.022 09/07/2017 0526   PHURINE 5.0 09/07/2017 0526   GLUCOSEU >=500 (A) 09/07/2017 0526   GLUCOSEU NEGATIVE 11/18/2012 1625   HGBUR NEGATIVE 09/07/2017 0526   BILIRUBINUR NEGATIVE 09/07/2017 0526   BILIRUBINUR negative 06/17/2015 1534   KETONESUR NEGATIVE 09/07/2017 0526   PROTEINUR NEGATIVE 09/07/2017 0526   UROBILINOGEN negative 06/17/2015 1534   UROBILINOGEN 0.2 06/12/2013 1228   NITRITE NEGATIVE 09/07/2017 0526   LEUKOCYTESUR NEGATIVE 09/07/2017 0526   Sepsis Labs: No results found for this or any previous visit (from the past 240 hour(s)).   Radiological  Exams on Admission: DG Chest 2 View  Result Date: 08/11/2020 CLINICAL DATA:  Shortness of breath. EXAM: CHEST - 2 VIEW COMPARISON:  June 01, 2020 FINDINGS: Enlarged cardiac silhouette. There is no evidence of focal airspace consolidation, pleural effusion or pneumothorax. Mild interstitial pulmonary edema. Osseous structures are without acute abnormality. Soft tissues are grossly normal. IMPRESSION: 1. Enlarged cardiac silhouette, similar to the prior exam. 2. Mild interstitial pulmonary edema. Electronically Signed   By: Fidela Salisbury M.D.   On: 08/11/2020 11:47    EKG: Independently reviewed. Normal sinus rhythm at 90 bpm  Assessment/Plan Diastolic congestive heart failure  exacerbation: Acute.  She presents with complaints of progressively worse shortness of breath over the last 1 to 2 weeks.  Patient with some mild crackles in +1 lower extremity pitting edema.  Chest x-ray significant for enlarged cardiac silhouette with mild interstitial edema.  Her last echocardiogram revealed EF of 60 to 65% with mild tricuspid and mitral regurgitation.  Patient follows with [facility] cardiology in the outpatient setting. -Admit to a telemetry bed -Heart failure orders set  initiated  -Continuous pulse oximetry with nasal cannula oxygen as needed to keep O2 saturations >92% -Strict I&Os and daily weights -Follow-up BNP -Lasix 20 mg BID -Held her spironolactone -Reassess in a.m. and adjust diuresis as needed. -Check echocardiogram -[facility] cardiology notified of the patient's admission  Hypertensive urgency/emergency: Patient presents with blood pressures elevated up to 218/117.  She had been on edarbyclor with adequate control of blood pressures, but medication was not approved by her insurance.  -Continue pharmacy substitution for olmesartan -Hydralazine IV as needed for elevated blood pressure greater than 200.  Hypokalemia: Acute.  Potassium 3.3 on admission. -Potassium chloride 40  p.o. -Continue to monitor and replace as needed  Diabetes mellitus type 2, uncontrolled: On admission glucose was elevated to 230. Last hemoglobin A1c was 8.5 in 12/2018. Home medication regimen includes Tresiba 15-18 units nightly, Ozempic 1 mg q. weekly, and Actos 30 mg daily. -Hypoglycemia protocol -Check Hemoglobin A1c -Held Ozempic and Actos -Continue Tresiba 15 units qhs -CBGs before every meal with moderate sliding scale insulin -Adjust insulin regimen as needed  Morbid obesity: BMI 55 kg/m -Will need continued counseling on need of weight loss with diet and lifestyle modifications  DVT prophylaxis: lovenox Code Status: Full  Family Communication: Patient's mother updated over the phone Disposition Plan: home  Consults called: none Admission status: Inpatient, require more than 2 midnight stay for IV diuresis  Norval Morton MD [facility]   If 7PM-7AM, please contact night-coverage   08/11/2020, 4:09 PM

## 2020-08-11 NOTE — ED Notes (Signed)
Meal tray ordered 

## 2020-08-11 NOTE — Progress Notes (Signed)
New pt admission from ED. Pt brought to the floor in stable condition. Vitals taken. BP is high, IV hydralazine 10 mg given, will reassess shortly,. Initial Assessment done. All immediate pertinent needs to patient addressed. Patient Guide given to patient. Important safety instructions relating to hospitalization reviewed with patient. Patient verbalized understanding. Will continue to monitor pt.  Palma Holter, RN

## 2020-08-11 NOTE — ED Notes (Signed)
Pt ambulated around nursing station on pulse ox. 98-100% on RA, pt starting to get short of breath towards end of walked, with weakness when getting back into bed.

## 2020-08-12 ENCOUNTER — Inpatient Hospital Stay (HOSPITAL_COMMUNITY): Payer: BC Managed Care – PPO

## 2020-08-12 DIAGNOSIS — Z794 Long term (current) use of insulin: Secondary | ICD-10-CM

## 2020-08-12 DIAGNOSIS — I5033 Acute on chronic diastolic (congestive) heart failure: Secondary | ICD-10-CM

## 2020-08-12 DIAGNOSIS — Z8249 Family history of ischemic heart disease and other diseases of the circulatory system: Secondary | ICD-10-CM

## 2020-08-12 DIAGNOSIS — I6523 Occlusion and stenosis of bilateral carotid arteries: Secondary | ICD-10-CM

## 2020-08-12 DIAGNOSIS — Z8673 Personal history of transient ischemic attack (TIA), and cerebral infarction without residual deficits: Secondary | ICD-10-CM

## 2020-08-12 DIAGNOSIS — I161 Hypertensive emergency: Secondary | ICD-10-CM

## 2020-08-12 LAB — GLUCOSE, CAPILLARY
Glucose-Capillary: 254 mg/dL — ABNORMAL HIGH (ref 70–99)
Glucose-Capillary: 245 mg/dL — ABNORMAL HIGH (ref 70–99)
Glucose-Capillary: 194 mg/dL — ABNORMAL HIGH (ref 70–99)
Glucose-Capillary: 209 mg/dL — ABNORMAL HIGH (ref 70–99)

## 2020-08-12 LAB — BASIC METABOLIC PANEL
Anion gap: 8 (ref 5–15)
BUN: 7 mg/dL (ref 6–20)
CO2: 27 mmol/L (ref 22–32)
Calcium: 8.9 mg/dL (ref 8.9–10.3)
Chloride: 101 mmol/L (ref 98–111)
Creatinine, Ser: 0.64 mg/dL (ref 0.44–1.00)
GFR, Estimated: >60 mL/min (ref >60)
Glucose, Bld: 229 mg/dL — ABNORMAL HIGH (ref 70–99)
Potassium: 3.6 mmol/L (ref 3.5–5.1)
Sodium: 136 mmol/L (ref 135–145)

## 2020-08-12 LAB — D-DIMER, QUANTITATIVE: D-Dimer, Quant: 2.5 ug/mL-FEU — ABNORMAL HIGH (ref 0.00–0.50)

## 2020-08-12 MED ORDER — HYDRALAZINE HCL 25 MG PO TABS: 25.0000 mg | ORAL_TABLET | Freq: Four times a day (QID) | ORAL | Status: DC | PRN

## 2020-08-12 MED ORDER — CARVEDILOL 6.25 MG PO TABS
6.2500 mg | ORAL_TABLET | Freq: Two times a day (BID) | ORAL | Status: DC
  Administered 2020-08-12 – 2020-08-13 (×2): 6.25 mg via ORAL
  Filled 2020-08-12 (×2): qty 1

## 2020-08-12 MED ORDER — ALPRAZOLAM 0.25 MG PO TABS
0.2500 mg | ORAL_TABLET | Freq: Every evening | ORAL | Status: DC | PRN
  Administered 2020-08-12: 0.25 mg via ORAL
  Filled 2020-08-12: qty 1

## 2020-08-12 MED ORDER — FUROSEMIDE 10 MG/ML IJ SOLN
20.0000 mg | Freq: Once | INTRAMUSCULAR | Status: AC
  Administered 2020-08-12: 20 mg via INTRAVENOUS
  Filled 2020-08-12: qty 2

## 2020-08-12 MED ORDER — ALPRAZOLAM 0.25 MG PO TABS
0.2500 mg | ORAL_TABLET | Freq: Once | ORAL | Status: AC
  Administered 2020-08-12: 0.25 mg via ORAL
  Filled 2020-08-12: qty 1

## 2020-08-12 MED ORDER — INSULIN ASPART 100 UNIT/ML IJ SOLN
5.0000 [IU] | Freq: Three times a day (TID) | INTRAMUSCULAR | Status: DC
  Administered 2020-08-12 – 2020-08-13 (×2): 5 [IU] via SUBCUTANEOUS

## 2020-08-12 NOTE — Consult Note (Signed)
CARDIOLOGY CONSULT NOTE  Patient ID: Lindsay Gomez MRN: [id number] DOB/AGE: 1970-05-06 50 y.o.  Admit date: 08/11/2020 Attending physician: Lindsay Slocumb, DO Primary Physician:  Lindsay Morning, DO Outpatient Cardiologist: Lindsay Kras, DO, [facility] Inpatient Cardiologist: Lindsay Kras, DO, [facility]  Reason of consultation: Heart failure evaluation Referring physician: Dr. Fuller Plan  Chief complaint: Shortness of breath  HPI:  Lindsay Gomez is a 50 y.o. African-American female who presents with a chief complaint of " shortness of breath." Her past medical history and cardiovascular risk factors include: History of stroke, insulin-dependent diabetes mellitus, family history of CAD, hypertension, morbid obesity.  Patient was last seen in the office in July 2021 and thereafter has not followed up due to high co-pays according to her my last conversation with her back in September 2021.  Patient states that she ran out of her blood pressure pills in November/December 2021 and when she went for a refill the co-pay was high and a prior authorization was required.  She was working with her PCP to get the medication approved which is still in progress.  She started noticing progressive shortness of breath over the last couple weeks and decided to come to the hospital for further evaluation and management.  Patient denies chest pain at rest or with effort related activities.  Per documentation she was noted to have a blood pressure of 218/117 on admission chest x-ray noted mild interstitial pulmonary edema.  She responded very well to IV Lasix and diuresed approximately 1 L thereafter.  Patient states that her shortness of breath is improved significantly.  Her BNP is within normal limits.  She denies orthopnea, paroxysmal nocturnal dyspnea or lower extremity swelling.  Cardiology is consulted for evaluation of HFpEF during this hospitalization.  ALLERGIES: Allergies  Allergen Reactions  .  Lisinopril Other (See Comments)    Cough     PAST MEDICAL HISTORY: Past Medical History:  Diagnosis Date  . Allergy    rhinitis  . Anemia   . Anxiety   . Diabetes mellitus without complication ([place])   . Enlarged heart   . Hyperlipidemia   . Hypertension   . Morbid obesity (Lindsay Gomez)   . Shortness of breath dyspnea    with low iron    PAST SURGICAL HISTORY: Past Surgical History:  Procedure Laterality Date  . ABDOMINAL HYSTERECTOMY N/A 01/29/2015   Procedure: TOTAL ABDOMINAL HYSTERECTOMY ;  Surgeon: Lindsay Dawley, MD;  Location: [facility];  Service: Gynecology;  Laterality: N/A;  . BILATERAL SALPINGECTOMY Bilateral 01/29/2015   Procedure: BILATERAL SALPINGECTOMY;  Surgeon: Lindsay Dawley, MD;  Location: [facility];  Service: Gynecology;  Laterality: Bilateral;  . CESAREAN SECTION    . KNEE SURGERY  1992   ? arthroscopic/ right    FAMILY HISTORY: The patient family history includes Cancer in her paternal grandmother; Coronary artery disease in her father; Diabetes in her father and mother; Heart attack in her father; Heart failure in her father; Hyperlipidemia in an other family member; Hypertension in her mother and another family member.   SOCIAL HISTORY:  The patient  reports that she has never smoked. She has never used smokeless tobacco. She reports that she does not drink alcohol and does not use drugs.  MEDICATIONS: Current Outpatient Medications  Medication Instructions  . aspirin (ASPIRIN 81) 81 mg, Oral, Daily, Swallow whole.   Marland Kitchen atorvastatin (LIPITOR) 20 mg, Oral, Daily at bedtime  . Blood Glucose Monitoring Suppl (ONE TOUCH ULTRA MINI) w/Device KIT Use device to test blood  sugars 1-4 times daily as instructed.  . calcium carbonate (TUMS - DOSED IN MG ELEMENTAL CALCIUM) 500 MG chewable tablet 1-2 tablets, Oral, As needed  . Continuous Blood Gluc Sensor (FREESTYLE LIBRE 14 DAY SENSOR) MISC APPLY AS DIRECTED AND CHANGE EVERY 14 DAYS.  Marland Kitchen glucose blood test strip Use as  instructed  . Insulin Pen Needle 32G X 4 MM MISC 1 Device, Does not apply, 2 times daily  . Lancets MISC Use one lancet per test to check blood sugar 1-4 times daily. Dx code:E11.9  . olmesartan (BENICAR) 20 mg, Oral, Daily  . Ozempic (1 MG/DOSE) 1 mg, Subcutaneous, Weekly  . pioglitazone (ACTOS) 30 MG tablet TAKE 1 TABLET ONCE DAILY.  Marland Kitchen spironolactone (ALDACTONE) 50 mg, Oral, Daily  . Tyler Aas FlexTouch 15 Units, Subcutaneous, Daily at bedtime    REVIEW OF SYSTEMS: Review of Systems  Constitutional: Negative for chills and fever.  HENT: Negative for hoarse voice and nosebleeds.   Eyes: Negative for discharge, double vision and pain.  Cardiovascular: Positive for dyspnea on exertion. Negative for chest pain, claudication, leg swelling, near-syncope, orthopnea, palpitations, paroxysmal nocturnal dyspnea and syncope.  Respiratory: Positive for shortness of breath. Negative for hemoptysis.   Musculoskeletal: Negative for muscle cramps and myalgias.  Gastrointestinal: Negative for abdominal pain, constipation, diarrhea, hematemesis, hematochezia, melena, nausea and vomiting.  Neurological: Negative for dizziness and light-headedness.  Psychiatric/Behavioral: Negative for altered mental status, depression and suicidal ideas. The patient does not have insomnia.   All other systems reviewed and are negative.   PHYSICAL EXAM: Vitals with BMI 08/12/2020 08/12/2020 08/12/2020  Height - - -  Weight - - -  BMI - - -  Systolic [phone]  Diastolic 88 89 89  Pulse 326 96 99     Intake/Output Summary (Last 24 hours) at 08/12/2020 1518 Last data filed at 08/12/2020 1322 Gross per 24 hour  Intake 480 ml  Output 1750 ml  Net -1270 ml    Net IO Since Admission: -2,020 mL [08/12/20 1518]  CONSTITUTIONAL: Appears older than stated age, hemodynamically stable, no acute distress.    SKIN: Skin is warm and dry. No rash noted. No cyanosis. No pallor. No jaundice HEAD: Normocephalic and atraumatic.   EYES: No scleral icterus MOUTH/THROAT: Moist oral membranes.  NECK: No JVD present. No thyromegaly noted. No carotid bruits  LYMPHATIC: No visible cervical adenopathy.  CHEST Normal respiratory effort. No intercostal retractions  LUNGS: Clear to auscultation bilaterally.  No stridor. No wheezes. No rales.  CARDIOVASCULAR: Regular rate and rhythm, positive S1-S2, no murmurs rubs or gallops appreciated. ABDOMINAL: Obese, soft, nontender, nondistended, positive bowel sounds in all 4 quadrants, no apparent ascites.  EXTREMITIES: No peripheral edema  HEMATOLOGIC: No significant bruising NEUROLOGIC: Oriented to person, place, and time. Nonfocal. Normal muscle tone.  PSYCHIATRIC: Normal mood and affect. Normal behavior. Cooperative  RADIOLOGY: DG Chest 2 View  Result Date: 08/11/2020 CLINICAL DATA:  Shortness of breath. EXAM: CHEST - 2 VIEW COMPARISON:  June 01, 2020 FINDINGS: Enlarged cardiac silhouette. There is no evidence of focal airspace consolidation, pleural effusion or pneumothorax. Mild interstitial pulmonary edema. Osseous structures are without acute abnormality. Soft tissues are grossly normal. IMPRESSION: 1. Enlarged cardiac silhouette, similar to the prior exam. 2. Mild interstitial pulmonary edema. Electronically Signed   By: Fidela Salisbury M.D.   On: 08/11/2020 11:47   DG CHEST PORT 1 VIEW  Result Date: 08/12/2020 CLINICAL DATA:  Shortness of breath EXAM: PORTABLE CHEST 1 VIEW COMPARISON:  Yesterday FINDINGS: Cardiomegaly.  Stable aortic and hilar contours. There is no edema, consolidation, effusion, or pneumothorax. No acute osseous finding. IMPRESSION: No acute finding. Cardiomegaly. Electronically Signed   By: Monte Fantasia M.D.   On: 08/12/2020 04:51    LABORATORY DATA: Lab Results  Component Value Date   WBC 5.9 08/11/2020   HGB 15.0 08/11/2020   HCT 46.4 (H) 08/11/2020   MCV 85.3 08/11/2020   PLT 262 08/11/2020    Recent Labs  Lab 08/12/20 0222  NA 136  K  3.6  CL 101  CO2 27  BUN 7  CREATININE 0.64  CALCIUM 8.9  GLUCOSE 229*    Lipid Panel     Component Value Date/Time   CHOL 188 03/03/2017 0301   TRIG 99 03/03/2017 0301   HDL 42 03/03/2017 0301   CHOLHDL 4.5 03/03/2017 0301   VLDL 20 03/03/2017 0301   LDLCALC 126 (H) 03/03/2017 0301    BNP (last 3 results) Recent Labs    08/11/20 1851  BNP 90.0    HEMOGLOBIN A1C Lab Results  Component Value Date   HGBA1C 8.5 (A) 12/16/2018   MPG 317.79 03/03/2017    Cardiac Panel (last 3 results) No results for input(s): CKTOTAL, CKMB, RELINDX in the last 8760 hours.  Invalid input(s): TROPONINHS  Lab Results  Component Value Date   CKTOTAL 76 06/23/2013   CKMB 1.0 06/23/2013     TSH No results for input(s): TSH in the last 8760 hours.    CARDIAC DATABASE: EKG: 08/11/2020: Normal sinus rhythm, 90 bpm, normal axis, prolonged QT, without underlying ischemia or injury pattern.  Echocardiogram: 10/03/2019:  Normal LV systolic function with visual EF 60-65%. Left ventricle cavity is normal in size. Moderate to severe left ventricular hypertrophy. Normal global wall motion. Indeterminate diastolic filling pattern, normal LAP. Calculated EF 64%.  Visually, left atrial appears dilated.  Mild (Grade I) mitral regurgitation.  Mild tricuspid regurgitation. No evidence of pulmonary hypertension.  Compared to prior study dated 02/2017: MR and TR are new.   Stress Testing:  None  Heart Catheterization: None  Carotid duplex: 02/2017:  Right Carotid: There is evidence in the right ICA of a 1-39% stenosis.  Left Carotid: There is evidence in the left ICA of a 1-39% stenosis.  Vertebrals: Both vertebral arteries were patent with antegrade flow.   [place] LE Venous Duplex 09/08/2019:   RIGHT: No evidence of common femoral vein obstruction.  LEFT:  There is no evidence of deep vein thrombosis in the lower extremity. No cystic structure found in the popliteal fossa.  IMPRESSION &  RECOMMENDATIONS: TYASIA PACKARD is a 50 y.o. African-American female whose past medical history and cardiovascular risk factors include: Benign essential hypertension, insulin-dependent diabetes mellitus type 2, hyperlipidemia, history of stroke, obesity due to excess calorie.  Impression: Hypertensive emergency (218/117 and pulmonary congestion). improving Shortness of breath.  Improving Benign essential hypertension. Insulin-dependent diabetes mellitus type 2. Long-term insulin use. History of stroke. Bilateral carotid artery atherosclerosis Hyperlipidemia. Family history of premature CAD. Noncompliance (outpatient follow-up and medication)  Plan: Cardiology was consulted during this hospitalization for evaluation and management of diastolic heart failure.  However, based on his symptoms and history of present illness likelihood of congestive heart failure is low.  I suspect that her presentation to the hospital is not secondary to hypertensive emergency as her systolic blood pressures on admission 218/117 and chest x-ray finding suggestive of mild vascular congestion.  She responded well to IV diuretics.  Patient's blood pressure is steadily improving and  currently managed per primary team.  Recommend transitioning her to medical therapy that is affordable so that her medication compliance improves.   She is currently on Avapro consider changing it to losartan to help with her medication cost or d/w [facility] pharmacy regarding cost of medication prior to discharge.  Currently on IV Lasix, transition that to spironolactone - given her BP and hypokalemia.  Currently on hydralazine 25 mg p.o. every 6 hours as needed.   We will start her on carvedilol 6.25 mg p.o. twice daily to help with blood pressure and heart rate control. Educated on importance of low-salt diet and increasing physical activity as tolerated with a goal of 30 minutes a day 5 days a week.  Independently reviewed EKG,  telemetry, prior echocardiogram during this evaluation.  Echocardiogram pending  Last LDL not at goal.  Continue current dose of Lipitor.  We will check fasting lipid profile and direct LDL.  Given her multiple cardiovascular risk factors, young age, history of stroke, and family history of premature CAD recommended an ischemic evaluation during prior office visits.  Patient refused stress testing in the past and therefore the shared decision was to proceed with coronary artery calcium scoring for further risk stratification but that too has not been completed due to poor outpatient follow up.  Patient is educated extensively with regards to improving her modifiable cardiovascular risk factors to decrease the progression of ASCVD.   Consider adding Jardiance given her diabetes mellitus type 2 - discussed with primary team.   Will follow patient with you peripherally during this hospitalization.  Please reach out if any questions or concerns arise.  We will update depart with follow-up visit information.  Plan of care discussed with both the patient and attending physician as a part of this encounter.  Patient's questions and concerns were addressed to her satisfaction. She voices understanding of the instructions provided during this encounter.   This note was created using a voice recognition software as a result there may be grammatical errors inadvertently enclosed that do not reflect the nature of this encounter. Every attempt is made to correct such errors.  Mechele Claude [facility]  Pager: [phone] Office: [phone] 08/12/2020, 3:18 PM

## 2020-08-12 NOTE — Progress Notes (Signed)
  Echocardiogram 2D Echocardiogram has been performed.  Merrie Roof F 08/12/2020, 4:26 PM

## 2020-08-12 NOTE — Hospital Course (Signed)
HPI on admission: "Lindsay Gomez is a 50 y.o. female with medical history significant of hypertension, hyperlipidemia, diastolic CHF, diabetes mellitus type 2, anemia presents with complaints of progressively worsening shortness of breath over the last 2 weeks.  Initially patient only noted symptoms while she was up and exerting herself, however over the last week she has noticed that she is having difficulty breathing even at rest.  Denies any significant fever, leg swelling, orthopnea,  cough, chest pain, nausea, vomiting, or diarrhea.  She had noticed a 7 pound weight gain over the last week.  Her blood pressures had been well controlled on Edarbyclor, but was taken off of the medication after insurance no longer would pay for it.  Her primary care provider had been in the process of trying to get prior authorization for this medicine.  She had followed up with her primary care provider last week and had been started on spironolactone.  She was given alternatives for the Edarbyclor, but misplaced them.   ED Course: Upon admission into the emergency department patient was seen to be afebrile with respirations 14-25, blood pressures elevated up to 218/117, and O2 saturations maintained on room air.  Labs significant for potassium 3.3, glucose 237, and high-sensitivity troponins negative x2.  BNP was pending.  Chest x-ray significant for enlarged cardiac silhouette with mild interstitial pulmonary edema.  Patient had been given Lasix 20 mg IV and had urinated 1 L."  Admitted to [facility] service with cardiology consulted. Undergoing IV diuresis and management of uncontrolled BP.

## 2020-08-12 NOTE — Progress Notes (Addendum)
PROGRESS NOTE    Lindsay Gomez   ZJI:[id number]  DOB: 1970/12/07  PCP: [name], DO    DOA: 08/11/2020 LOS: 1   Brief Narrative   HPI on admission: "Lindsay Gomez is a 50 y.o. female with medical history significant of hypertension, hyperlipidemia, diastolic CHF, diabetes mellitus type 2, anemia presents with complaints of progressively worsening shortness of breath over the last 2 weeks.  Initially patient only noted symptoms while she was up and exerting herself, however over the last week she has noticed that she is having difficulty breathing even at rest.  Denies any significant fever, leg swelling, orthopnea,  cough, chest pain, nausea, vomiting, or diarrhea.  She had noticed a 7 pound weight gain over the last week.  Her blood pressures had been well controlled on Edarbyclor, but was taken off of the medication after insurance no longer would pay for it.  Her primary care provider had been in the process of trying to get prior authorization for this medicine.  She had followed up with her primary care provider last week and had been started on spironolactone.  She was given alternatives for the Edarbyclor, but misplaced them.   ED Course: Upon admission into the emergency department patient was seen to be afebrile with respirations 14-25, blood pressures elevated up to 218/117, and O2 saturations maintained on room air.  Labs significant for potassium 3.3, glucose 237, and high-sensitivity troponins negative x2.  BNP was pending.  Chest x-ray significant for enlarged cardiac silhouette with mild interstitial pulmonary edema.  Patient had been given Lasix 20 mg IV and had urinated 1 L."  Admitted to [facility] service with cardiology consulted. Undergoing IV diuresis and management of uncontrolled BP.    Assessment & Plan   Principal Problem:   Acute on chronic diastolic CHF (congestive heart failure) (HCC) Active Problems:   Morbid obesity with BMI of 50.0-59.9, adult (HCC)    Hypokalemia   Type 2 diabetes mellitus (HCC)   Hypertensive urgency   Acute diastolic CHF - presented with complaints of progressively worse shortness of breath over the last 1 to 2 weeks, mild crackles and +1 lower extremity pitting edema on exam, consistent.   Chest x-ray significant for enlarged cardiac silhouette with mild interstitial edema.   Prior echocardiogram revealed EF of 60 to 65% with mild tricuspid and mitral regurgitation.   Follows with [facility] cardiology. --Cardiology consulted on admission --Continue IV Lasix --Monitor volume status: I/O' and daily wts --o2 if needed to keeps sats at or above 90%  Tachycardia, tachypnea - check D-dimer and further evaluation for PE if positive.  Hypertensive urgency/emergency - : Presented with BP 218/117.   Had been on edarbyclor with adequate control of BP, but medication was not approved by her insurance and PCP working on prior [place] reportedly.  -Continue pharmacy substitution for olmesartan -Hydralazine IV as needed for elevated blood pressure greater than 200.  Hypokalemia: Acute.  Potassium 3.3 on admission. -Potassium chloride 40 p.o. -Continue to monitor and replace as needed  Diabetes mellitus type 2, uncontrolled:  On admission glucose was elevated to 230.  Last hemoglobin A1c was 8.5 in 12/2018.  Home medication regimen includes Tresiba 15-18 units nightly, Ozempic 1 mg q. weekly, and Actos 30 mg daily. -Hypoglycemia protocol -Check Hemoglobin A1c -Held Ozempic and Actos -Continue Tresiba 15 units qhs -CBGs before every meal with moderate sliding scale insulin -Adjust insulin regimen as needed  Morbid obesity: Body mass index is 52.42 kg/m.  Complicates overall  care and prognosis.  Recommend lifestyle modifications including physical activity and diet for weight loss and overall long-term health.   DVT prophylaxis: enoxaparin (LOVENOX) injection 40 mg Start: 08/11/20 2200   Diet:  Diet Orders (From  admission, onward)    Start     Ordered   08/11/20 1635  Diet heart healthy/carb modified Room service appropriate? Yes; Fluid consistency: Thin; Fluid restriction: 2000 mL Fluid  Diet effective now       Question Answer Comment  Diet-HS Snack? Nothing   Room service appropriate? Yes   Fluid consistency: Thin   Fluid restriction: 2000 mL Fluid      08/11/20 1636            Code Status: Full Code    Subjective 08/12/20    Pt reports still feels SOB at times but little better.  Good urine output with Lasix.  No fever/chills.  Her SOB is actually worse sitting up and better when she lays down.  Did have an episode she got worse again overnight.     Disposition Plan & Communication   Status is: Inpatient  Remains inpatient appropriate because:IV treatments appropriate due to intensity of illness or inability to take PO   Dispo: The patient is from: Home              Anticipated d/c is to: Home              Patient currently is not medically stable to d/c.   Difficult to place patient No  Family Communication: Husband at bedside on rounds   Consults, Procedures, Significant Events   Consultants:   Cardiology  Procedures:     Antimicrobials:  Anti-infectives (From admission, onward)   None        Micro    Objective   Vitals:   08/12/20 0143 08/12/20 0417 08/12/20 0731 08/12/20 1141  BP: (!) 154/86 (!) 151/89 (!) 151/89 130/88  Pulse: (!) 101 99 96 (!) 103  Resp:  18 18 20   Temp:  98.3 F (36.8 C) 98.3 F (36.8 C) 97.7 F (36.5 C)  TempSrc:  Oral Oral Oral  SpO2: 99% 100% 98% 97%  Weight:      Height:        Intake/Output Summary (Last 24 hours) at 08/12/2020 1404 Last data filed at 08/12/2020 1322 Gross per 24 hour  Intake 480 ml  Output 2160 ml  Net -1680 ml   Filed Weights   08/11/20 1841 08/12/20 0107  Weight: 130.9 kg 130 kg    Physical Exam:  General exam: awake, alert, no acute distress, obese HEENT: moist mucus membranes, hearing  grossly normal  Respiratory system: CTAB, no wheezes, rales or rhonchi, normal respiratory effort. Cardiovascular system: normal S1/S2, RRR, nonpitting lower extremity edema.   Gastrointestinal system: soft, NT Central nervous system: A&O x3. no gross focal neurologic deficits, normal speech Skin: dry, intact, normal temperature Psychiatry: normal mood, congruent affect, judgement and insight appear normal  Labs   Data Reviewed: I have personally reviewed following labs and imaging studies  CBC: Recent Labs  Lab 08/11/20 1212  WBC 5.9  HGB 15.0  HCT 46.4*  MCV 85.3  PLT 025   Basic Metabolic Panel: Recent Labs  Lab 08/11/20 1212 08/12/20 0222  NA 138 136  K 3.3* 3.6  CL 99 101  CO2 29 27  GLUCOSE 237* 229*  BUN 5* 7  CREATININE 0.62 0.64  CALCIUM 8.7* 8.9   GFR: Estimated Creatinine Clearance: 112.7  mL/min (by C-G formula based on SCr of 0.64 mg/dL). Liver Function Tests: No results for input(s): AST, ALT, ALKPHOS, BILITOT, PROT, ALBUMIN in the last 168 hours. No results for input(s): LIPASE, AMYLASE in the last 168 hours. No results for input(s): AMMONIA in the last 168 hours. Coagulation Profile: No results for input(s): INR, PROTIME in the last 168 hours. Cardiac Enzymes: No results for input(s): CKTOTAL, CKMB, CKMBINDEX, TROPONINI in the last 168 hours. BNP (last 3 results) No results for input(s): PROBNP in the last 8760 hours. HbA1C: No results for input(s): HGBA1C in the last 72 hours. CBG: Recent Labs  Lab 08/11/20 1747 08/11/20 2112 08/12/20 0625 08/12/20 1144  GLUCAP 161* 258* 254* 245*   Lipid Profile: No results for input(s): CHOL, HDL, LDLCALC, TRIG, CHOLHDL, LDLDIRECT in the last 72 hours. Thyroid Function Tests: No results for input(s): TSH, T4TOTAL, FREET4, T3FREE, THYROIDAB in the last 72 hours. Anemia Panel: No results for input(s): VITAMINB12, FOLATE, FERRITIN, TIBC, IRON, RETICCTPCT in the last 72 hours. Sepsis Labs: No results for  input(s): PROCALCITON, LATICACIDVEN in the last 168 hours.  No results found for this or any previous visit (from the past 240 hour(s)).    Imaging Studies   DG Chest 2 View  Result Date: 08/11/2020 CLINICAL DATA:  Shortness of breath. EXAM: CHEST - 2 VIEW COMPARISON:  June 01, 2020 FINDINGS: Enlarged cardiac silhouette. There is no evidence of focal airspace consolidation, pleural effusion or pneumothorax. Mild interstitial pulmonary edema. Osseous structures are without acute abnormality. Soft tissues are grossly normal. IMPRESSION: 1. Enlarged cardiac silhouette, similar to the prior exam. 2. Mild interstitial pulmonary edema. Electronically Signed   By: Fidela Salisbury M.D.   On: 08/11/2020 11:47   DG CHEST PORT 1 VIEW  Result Date: 08/12/2020 CLINICAL DATA:  Shortness of breath EXAM: PORTABLE CHEST 1 VIEW COMPARISON:  Yesterday FINDINGS: Cardiomegaly. Stable aortic and hilar contours. There is no edema, consolidation, effusion, or pneumothorax. No acute osseous finding. IMPRESSION: No acute finding. Cardiomegaly. Electronically Signed   By: Monte Fantasia M.D.   On: 08/12/2020 04:51     Medications   Scheduled Meds: . aspirin EC  81 mg Oral Daily  . atorvastatin  20 mg Oral QHS  . enoxaparin (LOVENOX) injection  40 mg Subcutaneous Q24H  . furosemide  20 mg Intravenous BID  . insulin aspart  0-15 Units Subcutaneous TID WC  . insulin glargine  15 Units Subcutaneous QHS  . irbesartan  150 mg Oral Daily  . sodium chloride flush  3 mL Intravenous Q12H   Continuous Infusions: . sodium chloride         LOS: 1 day    Time spent: 30 minutes    Ezekiel Slocumb, DO [facility]  08/12/2020, 2:04 PM      If 7PM-7AM, please contact night-coverage. How to contact the [facility] Attending or Consulting provider [place] or covering provider during after hours [place], for this patient?    1. Check the care team in [facility] and look for a) attending/consulting [facility] provider  listed and b) the [facility] team listed 2. Log into [email or web address] and use [place]'s universal password to access. If you do not have the password, please contact the hospital operator. 3. Locate the [facility] provider you are looking for under [facility] and page to a number that you can be directly reached. 4. If you still have difficulty reaching the provider, please page the [facility] (Director on Call) for the Hospitalists listed  on amion for assistance.

## 2020-08-13 ENCOUNTER — Other Ambulatory Visit (HOSPITAL_COMMUNITY): Payer: Self-pay

## 2020-08-13 ENCOUNTER — Other Ambulatory Visit: Payer: BC Managed Care – PPO

## 2020-08-13 ENCOUNTER — Inpatient Hospital Stay (HOSPITAL_COMMUNITY): Payer: BC Managed Care – PPO

## 2020-08-13 LAB — BASIC METABOLIC PANEL
Anion gap: 10 (ref 5–15)
BUN: 18 mg/dL (ref 6–20)
CO2: 26 mmol/L (ref 22–32)
Calcium: 8.9 mg/dL (ref 8.9–10.3)
Chloride: 100 mmol/L (ref 98–111)
Creatinine, Ser: 0.74 mg/dL (ref 0.44–1.00)
GFR, Estimated: >60 mL/min (ref >60)
Glucose, Bld: 233 mg/dL — ABNORMAL HIGH (ref 70–99)
Potassium: 3.4 mmol/L — ABNORMAL LOW (ref 3.5–5.1)
Sodium: 136 mmol/L (ref 135–145)

## 2020-08-13 LAB — LIPID PANEL
Cholesterol: 153 mg/dL (ref 0–200)
HDL: 48 mg/dL (ref >40)
LDL Cholesterol: 91 mg/dL (ref 0–99)
Total CHOL/HDL Ratio: 3.2 RATIO
Triglycerides: 71 mg/dL (ref <150)
VLDL: 14 mg/dL (ref 0–40)

## 2020-08-13 LAB — GLUCOSE, CAPILLARY
Glucose-Capillary: 204 mg/dL — ABNORMAL HIGH (ref 70–99)
Glucose-Capillary: 307 mg/dL — ABNORMAL HIGH (ref 70–99)

## 2020-08-13 LAB — ECHOCARDIOGRAM COMPLETE
AR max vel: 1.83 cm2
AV Area VTI: 1.93 cm2
AV Area mean vel: 1.59 cm2
AV Mean grad: 4 mmHg
AV Peak grad: 5.7 mmHg
Ao pk vel: 1.19 m/s
Area-P 1/2: 4.77 cm2
Height: 62 in
S' Lateral: 3.5 cm
Weight: 4585.6 oz

## 2020-08-13 LAB — LDL CHOLESTEROL, DIRECT: Direct LDL: 99.4 mg/dL — ABNORMAL HIGH (ref 0–99)

## 2020-08-13 LAB — HEMOGLOBIN A1C
Hgb A1c MFr Bld: 10.1 % — ABNORMAL HIGH (ref 4.8–5.6)
Mean Plasma Glucose: 243 mg/dL

## 2020-08-13 MED ORDER — POTASSIUM CHLORIDE 20 MEQ PO PACK
40.0000 meq | PACK | Freq: Once | ORAL | Status: DC
  Filled 2020-08-13: qty 2

## 2020-08-13 MED ORDER — CARVEDILOL 6.25 MG PO TABS
6.2500 mg | ORAL_TABLET | Freq: Two times a day (BID) | ORAL | 0 refills | Status: DC
  Filled 2020-08-13: qty 60, 30d supply, fill #0

## 2020-08-13 MED ORDER — TECHNETIUM TO 99M ALBUMIN AGGREGATED
4.4000 | Freq: Once | INTRAVENOUS | Status: AC | PRN
  Administered 2020-08-13: 4.4 via INTRAVENOUS

## 2020-08-13 MED ORDER — SPIRONOLACTONE 50 MG PO TABS
50.0000 mg | ORAL_TABLET | Freq: Every day | ORAL | 0 refills | Status: DC
  Filled 2020-08-13: qty 30, 30d supply, fill #0

## 2020-08-13 MED ORDER — IRBESARTAN 150 MG PO TABS
150.0000 mg | ORAL_TABLET | Freq: Every day | ORAL | 0 refills | Status: DC
  Filled 2020-08-13: qty 30, 30d supply, fill #0

## 2020-08-13 NOTE — Progress Notes (Signed)
Heart Failure Navigator Progress Note  Assessed for Heart & Vascular TOC clinic readiness.  Unfortunately at this time the patient does not meet criteria since based on her symptoms and history of present illness, likelihood of congestive heart failure is low per cardiology. LVEF 50-55%.   Navigator available for reassessment of patient.   Kerby Nora, PharmD, BCPS Heart Failure Stewardship Pharmacist Phone [phone]

## 2020-08-13 NOTE — Discharge Summary (Signed)
PATIENT DETAILS Name: Lindsay Gomez Age: 50 y.o. Sex: female Date of Birth: [birth year] MRN: [id number]. Admitting Physician: Norval Morton, MD GTX:MIWOEHO, Hinton Dyer, DO  Admit Date: 08/11/2020 Discharge date: 08/13/2020  Recommendations for Outpatient Follow-up:  1. Follow up with PCP in 1-2 weeks 2. Please obtain CMP/CBC in one week  Admitted From:  Home  Disposition: [place]: No  Equipment/Devices: None  Discharge Condition: Stable  CODE STATUS: FULL CODE  Diet recommendation:  Diet Order            Diet - low sodium heart healthy           Diet heart healthy/carb modified Room service appropriate? Yes; Fluid consistency: Thin; Fluid restriction: 2000 mL Fluid  Diet effective now                  Brief Summary: See H&P, Labs, Consult and Test reports for all details in brief, patient is a 50 year old female with HTN, HLD, DM-2, obesity, chronic diastolic heart failure who presented with shortness of breath-felt to be due to decompensated diastolic heart failure.  She was also found to have significantly elevated blood pressure.  Brief Hospital Course: Hypertensive emergency: BP stabilized-and much better controlled.  Evaluated by cardiology-recommendations are to continue ARB, Coreg and Aldactone.  Patient to follow-up with her primary care practitioner for further optimization.  Acute on chronic diastolic heart failure: Likely provoked by uncontrolled hypertension-improved rapidly with IV diuretics.  Remains stable on room air-exertional dyspnea has markedly improved compared to how it was on admission.  Cardiology recommends to transition to Aldactone on discharge.  Follow-up with PCP for further optimization.  Elevated D-dimer: Very low suspicion for VTE given clinical scenario-VQ scan was negative.  Doubt any further work-up required.  HLD: Continue statin-follow with PCP for further optimization.  DM-2 (A1c 10.1 on 5/29): CBGs relatively  stable-continue Tresiba, Actos and Ozempic as previous.  Patient to follow-up with PCP for further optimization.  Morbid obesity Body mass index is 52.42 kg/m.   Procedures None  Discharge Diagnoses:  Principal Problem:   Acute on chronic diastolic CHF (congestive heart failure) (HCC) Active Problems:   Morbid obesity with BMI of 50.0-59.9, adult (HCC)   Hypokalemia   SOB (shortness of breath)   Type 2 diabetes mellitus with hyperlipidemia ([place])   Noncompliance   Hypertensive urgency   Hypertensive emergency   Long-term insulin use ([place])   History of stroke   Mild atherosclerosis of carotid artery, bilateral   Family history of premature CAD   Discharge Instructions:  Activity:  As tolerated with Full fall precautions use walker/cane & assistance as needed   Discharge Instructions    Call MD for:  difficulty breathing, headache or visual disturbances   Complete by: As directed    Call MD for:  extreme fatigue   Complete by: As directed    Diet - low sodium heart healthy   Complete by: As directed    Increase activity slowly   Complete by: As directed      Allergies as of 08/13/2020      Reactions   Lisinopril Other (See Comments)   Cough       Medication List    STOP taking these medications   olmesartan 20 MG tablet Commonly known as: BENICAR Replaced by: irbesartan 150 MG tablet     TAKE these medications   aspirin 81 MG EC tablet Commonly known as: Aspirin 81 Take 1 tablet (81 mg  total) by mouth daily. Swallow whole.   atorvastatin 20 MG tablet Commonly known as: LIPITOR Take 20 mg by mouth at bedtime.   calcium carbonate 500 MG chewable tablet Commonly known as: TUMS - dosed in mg elemental calcium Chew 1-2 tablets by mouth as needed for indigestion or heartburn.   carvedilol 6.25 MG tablet Commonly known as: COREG Take 1 tablet (6.25 mg total) by mouth 2 (two) times daily with a meal.   FreeStyle Libre 14 Day Sensor Misc APPLY AS DIRECTED  AND CHANGE EVERY 14 DAYS.   glucose blood test strip Use as instructed   Insulin Pen Needle 32G X 4 MM Misc 1 Device by Does not apply route 2 (two) times daily.   irbesartan 150 MG tablet Commonly known as: AVAPRO Take 1 tablet (150 mg total) by mouth daily. Start taking on: August 14, 2020 Replaces: olmesartan 20 MG tablet   Lancets Misc Use one lancet per test to check blood sugar 1-4 times daily. Dx code:E11.9   ONE TOUCH ULTRA MINI w/Device Kit Use device to test blood sugars 1-4 times daily as instructed.   Ozempic (1 MG/DOSE) 4 MG/3ML Sopn Generic drug: Semaglutide (1 MG/DOSE) Inject 1 mg into the skin once a week.   pioglitazone 30 MG tablet Commonly known as: ACTOS TAKE 1 TABLET ONCE DAILY.   spironolactone 50 MG tablet Commonly known as: ALDACTONE Take 1 tablet (50 mg total) by mouth daily.   Tyler Aas FlexTouch 100 UNIT/ML FlexTouch Pen Generic drug: insulin degludec Inject 15 Units into the skin at bedtime.       Follow-up Information    [name], DO. Schedule an appointment as soon as possible for a visit in 1 week(s).   Specialty: Family Medicine Contact information: [address] [place] [address] [place] [address] [phone]              Allergies  Allergen Reactions  . Lisinopril Other (See Comments)    Cough      Consultations:   cardiology   Other Procedures/Studies: DG Chest 2 View  Result Date: 08/11/2020 CLINICAL DATA:  Shortness of breath. EXAM: CHEST - 2 VIEW COMPARISON:  June 01, 2020 FINDINGS: Enlarged cardiac silhouette. There is no evidence of focal airspace consolidation, pleural effusion or pneumothorax. Mild interstitial pulmonary edema. Osseous structures are without acute abnormality. Soft tissues are grossly normal. IMPRESSION: 1. Enlarged cardiac silhouette, similar to the prior exam. 2. Mild interstitial pulmonary edema. Electronically Signed   By: Fidela Salisbury M.D.   On: 08/11/2020 11:47   NM  Pulmonary Perfusion  Result Date: 08/13/2020 CLINICAL DATA:  Shortness of breath EXAM: NUCLEAR MEDICINE PERFUSION LUNG SCAN TECHNIQUE: Perfusion images were obtained in multiple projections after intravenous injection of radiopharmaceutical. Views: Anterior, posterior, left lateral, right lateral, RPO, LPO, RAO, LAO RADIOPHARMACEUTICALS:  4.4 mCi Tc-29mMAA IV COMPARISON:  Chest radiograph Aug 13, 2020 FINDINGS: Radiotracer uptake is homogeneous and symmetric bilaterally. No perfusion defects evident. Cardiac prominence noted. IMPRESSION: No perfusion defects evident. No findings indicative of pulmonary embolus. Electronically Signed   By: WLowella GripIII M.D.   On: 08/13/2020 11:01   DG CHEST PORT 1 VIEW  Result Date: 08/13/2020 CLINICAL DATA:  Shortness of breath EXAM: PORTABLE CHEST 1 VIEW COMPARISON:  Aug 12, 2020 FINDINGS: Lungs are clear. Heart is enlarged with pulmonary vascularity normal, stable. No adenopathy. No bone lesions. IMPRESSION: Lungs clear.  Stable cardiac prominence. Electronically Signed   By: WLowella GripIII M.D.   On: 08/13/2020  08:47   DG CHEST PORT 1 VIEW  Result Date: 08/12/2020 CLINICAL DATA:  Shortness of breath EXAM: PORTABLE CHEST 1 VIEW COMPARISON:  Yesterday FINDINGS: Cardiomegaly. Stable aortic and hilar contours. There is no edema, consolidation, effusion, or pneumothorax. No acute osseous finding. IMPRESSION: No acute finding. Cardiomegaly. Electronically Signed   By: Monte Fantasia M.D.   On: 08/12/2020 04:51   ECHOCARDIOGRAM COMPLETE  Result Date: 08/13/2020    ECHOCARDIOGRAM REPORT   Patient Name:   MARELY APGAR Date of Exam: 08/12/2020 Medical Rec #:  [id number]         Height:       62.0 in Accession #:    [id number]        Weight:       286.6 lb Date of Birth:  Dec 28, 1970         BSA:          2.228 m Patient Age:    89 years          BP:           162/118 mmHg Patient Gender: F                 HR:           98 bpm. Exam Location:  Inpatient  Procedure: 2D Echo, Cardiac Doppler and Color Doppler Indications:     R94.31 Abnormal EKG  History:         Patient has prior history of Echocardiogram examinations, most                  recent 03/03/2017. Signs/Symptoms:Shortness of Breath; Risk                  Factors:Hypertension and Morbid obesity.  Sonographer:     Merrie Roof RDCS Referring Phys:  [id number] RONDELL A SMITH Diagnosing Phys: Rex Kras DO IMPRESSIONS  1. Left ventricular ejection fraction, by estimation, is 50 to 55%. The left ventricle has low normal function. The left ventricle has no regional wall motion abnormalities. There is moderate left ventricular hypertrophy. Left ventricular diastolic parameters were normal.  2. Right ventricular systolic function is normal. The right ventricular size is normal. There is normal pulmonary artery systolic pressure.  3. The mitral valve is normal in structure. Mild mitral valve regurgitation. No evidence of mitral stenosis.  4. The aortic valve is tricuspid. Aortic valve regurgitation is not visualized. No aortic stenosis is present.  5. The inferior vena cava is normal in size with greater than 50% respiratory variability, suggesting right atrial pressure of 3 mmHg. Comparison(s): 10/03/2019: LVEF 60-65%, moderate to severe LVH, indeterminate diastolic filling pattern, normal LAP, Mild MR and TR. FINDINGS  Left Ventricle: Left ventricular ejection fraction, by estimation, is 50 to 55%. The left ventricle has low normal function. The left ventricle has no regional wall motion abnormalities. The left ventricular internal cavity size was normal in size. There is moderate left ventricular hypertrophy. Left ventricular diastolic parameters were normal. Right Ventricle: The right ventricular size is normal. No increase in right ventricular wall thickness. Right ventricular systolic function is normal. There is normal pulmonary artery systolic pressure. The tricuspid regurgitant velocity is 2.24 m/s, and   with an assumed right atrial pressure of 3 mmHg, the estimated right ventricular systolic pressure is 65.7 mmHg. Left Atrium: Left atrial size was normal in size. Right Atrium: Right atrial size was normal in size. Pericardium: There is no evidence of pericardial effusion. Mitral Valve: The  mitral valve is normal in structure. Mild mitral valve regurgitation. No evidence of mitral valve stenosis. Tricuspid Valve: The tricuspid valve is normal in structure. Tricuspid valve regurgitation is mild. Aortic Valve: The aortic valve is tricuspid. Aortic valve regurgitation is not visualized. No aortic stenosis is present. Aortic valve mean gradient measures 4.0 mmHg. Aortic valve peak gradient measures 5.7 mmHg. Aortic valve area, by VTI measures 1.93 cm. Pulmonic Valve: The pulmonic valve was not well visualized. Pulmonic valve regurgitation is trivial. No evidence of pulmonic stenosis. Aorta: The aortic root and ascending aorta are structurally normal, with no evidence of dilitation. Venous: The inferior vena cava is normal in size with greater than 50% respiratory variability, suggesting right atrial pressure of 3 mmHg. IAS/Shunts: The atrial septum is grossly normal.  LEFT VENTRICLE PLAX 2D LVIDd:         4.70 cm  Diastology LVIDs:         3.50 cm  LV e' medial:    4.35 cm/s LV PW:         1.30 cm  LV E/e' medial:  9.3 LV IVS:        1.40 cm  LV e' lateral:   3.92 cm/s LVOT diam:     1.90 cm  LV E/e' lateral: 10.3 LV SV:         35 LV SV Index:   16 LVOT Area:     2.84 cm  RIGHT VENTRICLE          IVC RV Basal diam:  3.50 cm  IVC diam: 1.60 cm LEFT ATRIUM             Index       RIGHT ATRIUM           Index LA diam:        4.10 cm 1.84 cm/m  RA Area:     15.40 cm LA Vol (A2C):   47.8 ml 21.46 ml/m RA Volume:   34.90 ml  15.67 ml/m LA Vol (A4C):   76.0 ml 34.12 ml/m LA Biplane Vol: 67.1 ml 30.12 ml/m  AORTIC VALVE AV Area (Vmax):    1.83 cm AV Area (Vmean):   1.59 cm AV Area (VTI):     1.93 cm AV Vmax:            119.00 cm/s AV Vmean:          93.500 cm/s AV VTI:            0.181 m AV Peak Grad:      5.7 mmHg AV Mean Grad:      4.0 mmHg LVOT Vmax:         77.00 cm/s LVOT Vmean:        52.400 cm/s LVOT VTI:          0.123 m LVOT/AV VTI ratio: 0.68  AORTA Ao Root diam: 3.10 cm Ao Asc diam:  2.80 cm MITRAL VALVE               TRICUSPID VALVE MV Area (PHT): 4.77 cm    TR Peak grad:   20.1 mmHg MV Decel Time: 159 msec    TR Vmax:        224.00 cm/s MV E velocity: 40.30 cm/s MV A velocity: 78.00 cm/s  SHUNTS MV E/A ratio:  0.52        Systemic VTI:  0.12 m  Systemic Diam: 1.90 cm Sunit Tolia DO Electronically signed by Rex Kras DO Signature Date/Time: 08/13/2020/9:19:36 AM    Final      TODAY-DAY OF DISCHARGE:  Subjective:   Cleatrice Burke today has no headache,no chest abdominal pain,no new weakness tingling or numbness, feels much better wants to go home today.   Objective:   Blood pressure (!) 136/93, pulse 91, temperature 98.2 F (36.8 C), temperature source Oral, resp. rate 17, height 5' 2"  (1.575 m), weight 130 kg, last menstrual period 12/18/2014, SpO2 100 %.  Intake/Output Summary (Last 24 hours) at 08/13/2020 1129 Last data filed at 08/13/2020 0400 Gross per 24 hour  Intake 723 ml  Output 800 ml  Net -77 ml   Filed Weights   08/11/20 1841 08/12/20 0107 08/13/20 0429  Weight: 130.9 kg 130 kg 130 kg    Exam: Awake Alert, Oriented *3, No new F.N deficits, Normal affect .AT,PERRAL Supple Neck,No JVD, No cervical lymphadenopathy appriciated.  Symmetrical Chest wall movement, Good air movement bilaterally, CTAB RRR,No Gallops,Rubs or new Murmurs, No Parasternal Heave +ve B.Sounds, Abd Soft, Non tender, No organomegaly appriciated, No rebound -guarding or rigidity. No Cyanosis, Clubbing or edema, No new Rash or bruise   PERTINENT RADIOLOGIC STUDIES: DG Chest 2 View  Result Date: 08/11/2020 CLINICAL DATA:  Shortness of breath. EXAM: CHEST - 2 VIEW COMPARISON:   June 01, 2020 FINDINGS: Enlarged cardiac silhouette. There is no evidence of focal airspace consolidation, pleural effusion or pneumothorax. Mild interstitial pulmonary edema. Osseous structures are without acute abnormality. Soft tissues are grossly normal. IMPRESSION: 1. Enlarged cardiac silhouette, similar to the prior exam. 2. Mild interstitial pulmonary edema. Electronically Signed   By: Fidela Salisbury M.D.   On: 08/11/2020 11:47   NM Pulmonary Perfusion  Result Date: 08/13/2020 CLINICAL DATA:  Shortness of breath EXAM: NUCLEAR MEDICINE PERFUSION LUNG SCAN TECHNIQUE: Perfusion images were obtained in multiple projections after intravenous injection of radiopharmaceutical. Views: Anterior, posterior, left lateral, right lateral, RPO, LPO, RAO, LAO RADIOPHARMACEUTICALS:  4.4 mCi Tc-42mMAA IV COMPARISON:  Chest radiograph Aug 13, 2020 FINDINGS: Radiotracer uptake is homogeneous and symmetric bilaterally. No perfusion defects evident. Cardiac prominence noted. IMPRESSION: No perfusion defects evident. No findings indicative of pulmonary embolus. Electronically Signed   By: WLowella GripIII M.D.   On: 08/13/2020 11:01   DG CHEST PORT 1 VIEW  Result Date: 08/13/2020 CLINICAL DATA:  Shortness of breath EXAM: PORTABLE CHEST 1 VIEW COMPARISON:  Aug 12, 2020 FINDINGS: Lungs are clear. Heart is enlarged with pulmonary vascularity normal, stable. No adenopathy. No bone lesions. IMPRESSION: Lungs clear.  Stable cardiac prominence. Electronically Signed   By: WLowella GripIII M.D.   On: 08/13/2020 08:47   DG CHEST PORT 1 VIEW  Result Date: 08/12/2020 CLINICAL DATA:  Shortness of breath EXAM: PORTABLE CHEST 1 VIEW COMPARISON:  Yesterday FINDINGS: Cardiomegaly. Stable aortic and hilar contours. There is no edema, consolidation, effusion, or pneumothorax. No acute osseous finding. IMPRESSION: No acute finding. Cardiomegaly. Electronically Signed   By: JMonte FantasiaM.D.   On: 08/12/2020 04:51    ECHOCARDIOGRAM COMPLETE  Result Date: 08/13/2020    ECHOCARDIOGRAM REPORT   Patient Name:   THOLLAN PHILIPPDate of Exam: 08/12/2020 Medical Rec #:  [id number]        Height:       62.0 in Accession #:    [id number]       Weight:       286.6 lb Date of Birth:  03/26/1970         BSA:          2.228 m Patient Age:    30 years          BP:           162/118 mmHg Patient Gender: F                 HR:           98 bpm. Exam Location:  Inpatient Procedure: 2D Echo, Cardiac Doppler and Color Doppler Indications:     R94.31 Abnormal EKG  History:         Patient has prior history of Echocardiogram examinations, most                  recent 03/03/2017. Signs/Symptoms:Shortness of Breath; Risk                  Factors:Hypertension and Morbid obesity.  Sonographer:     Merrie Roof RDCS Referring Phys:  [id number] RONDELL A SMITH Diagnosing Phys: Rex Kras DO IMPRESSIONS  1. Left ventricular ejection fraction, by estimation, is 50 to 55%. The left ventricle has low normal function. The left ventricle has no regional wall motion abnormalities. There is moderate left ventricular hypertrophy. Left ventricular diastolic parameters were normal.  2. Right ventricular systolic function is normal. The right ventricular size is normal. There is normal pulmonary artery systolic pressure.  3. The mitral valve is normal in structure. Mild mitral valve regurgitation. No evidence of mitral stenosis.  4. The aortic valve is tricuspid. Aortic valve regurgitation is not visualized. No aortic stenosis is present.  5. The inferior vena cava is normal in size with greater than 50% respiratory variability, suggesting right atrial pressure of 3 mmHg. Comparison(s): 10/03/2019: LVEF 60-65%, moderate to severe LVH, indeterminate diastolic filling pattern, normal LAP, Mild MR and TR. FINDINGS  Left Ventricle: Left ventricular ejection fraction, by estimation, is 50 to 55%. The left ventricle has low normal function. The left ventricle has no  regional wall motion abnormalities. The left ventricular internal cavity size was normal in size. There is moderate left ventricular hypertrophy. Left ventricular diastolic parameters were normal. Right Ventricle: The right ventricular size is normal. No increase in right ventricular wall thickness. Right ventricular systolic function is normal. There is normal pulmonary artery systolic pressure. The tricuspid regurgitant velocity is 2.24 m/s, and  with an assumed right atrial pressure of 3 mmHg, the estimated right ventricular systolic pressure is 45.4 mmHg. Left Atrium: Left atrial size was normal in size. Right Atrium: Right atrial size was normal in size. Pericardium: There is no evidence of pericardial effusion. Mitral Valve: The mitral valve is normal in structure. Mild mitral valve regurgitation. No evidence of mitral valve stenosis. Tricuspid Valve: The tricuspid valve is normal in structure. Tricuspid valve regurgitation is mild. Aortic Valve: The aortic valve is tricuspid. Aortic valve regurgitation is not visualized. No aortic stenosis is present. Aortic valve mean gradient measures 4.0 mmHg. Aortic valve peak gradient measures 5.7 mmHg. Aortic valve area, by VTI measures 1.93 cm. Pulmonic Valve: The pulmonic valve was not well visualized. Pulmonic valve regurgitation is trivial. No evidence of pulmonic stenosis. Aorta: The aortic root and ascending aorta are structurally normal, with no evidence of dilitation. Venous: The inferior vena cava is normal in size with greater than 50% respiratory variability, suggesting right atrial pressure of 3 mmHg. IAS/Shunts: The atrial septum is grossly normal.  LEFT VENTRICLE PLAX 2D LVIDd:  4.70 cm  Diastology LVIDs:         3.50 cm  LV e' medial:    4.35 cm/s LV PW:         1.30 cm  LV E/e' medial:  9.3 LV IVS:        1.40 cm  LV e' lateral:   3.92 cm/s LVOT diam:     1.90 cm  LV E/e' lateral: 10.3 LV SV:         35 LV SV Index:   16 LVOT Area:     2.84 cm   RIGHT VENTRICLE          IVC RV Basal diam:  3.50 cm  IVC diam: 1.60 cm LEFT ATRIUM             Index       RIGHT ATRIUM           Index LA diam:        4.10 cm 1.84 cm/m  RA Area:     15.40 cm LA Vol (A2C):   47.8 ml 21.46 ml/m RA Volume:   34.90 ml  15.67 ml/m LA Vol (A4C):   76.0 ml 34.12 ml/m LA Biplane Vol: 67.1 ml 30.12 ml/m  AORTIC VALVE AV Area (Vmax):    1.83 cm AV Area (Vmean):   1.59 cm AV Area (VTI):     1.93 cm AV Vmax:           119.00 cm/s AV Vmean:          93.500 cm/s AV VTI:            0.181 m AV Peak Grad:      5.7 mmHg AV Mean Grad:      4.0 mmHg LVOT Vmax:         77.00 cm/s LVOT Vmean:        52.400 cm/s LVOT VTI:          0.123 m LVOT/AV VTI ratio: 0.68  AORTA Ao Root diam: 3.10 cm Ao Asc diam:  2.80 cm MITRAL VALVE               TRICUSPID VALVE MV Area (PHT): 4.77 cm    TR Peak grad:   20.1 mmHg MV Decel Time: 159 msec    TR Vmax:        224.00 cm/s MV E velocity: 40.30 cm/s MV A velocity: 78.00 cm/s  SHUNTS MV E/A ratio:  0.52        Systemic VTI:  0.12 m                            Systemic Diam: 1.90 cm Sunit Tolia DO Electronically signed by Rex Kras DO Signature Date/Time: 08/13/2020/9:19:36 AM    Final      PERTINENT LAB RESULTS: CBC: Recent Labs    08/11/20 1212  WBC 5.9  HGB 15.0  HCT 46.4*  PLT 262   CMET CMP     Component Value Date/Time   NA 136 08/13/2020 0528   NA 137 07/29/2015 1620   K 3.4 (L) 08/13/2020 0528   K 3.3 (L) 07/29/2015 1620   CL 100 08/13/2020 0528   CO2 26 08/13/2020 0528   CO2 31 (H) 07/29/2015 1620   GLUCOSE 233 (H) 08/13/2020 0528   GLUCOSE 315 (H) 07/29/2015 1620   BUN 18 08/13/2020 0528   BUN 11.5 07/29/2015 1620   CREATININE 0.74 08/13/2020  0528   CREATININE 1.1 07/29/2015 1620   CALCIUM 8.9 08/13/2020 0528   CALCIUM 9.7 07/29/2015 1620   PROT 8.1 05/09/2018 1559   PROT 7.5 07/29/2015 1620   ALBUMIN 4.3 05/09/2018 1559   ALBUMIN 3.5 07/29/2015 1620   AST 39 (H) 05/09/2018 1559   AST 92 (H) 07/29/2015 1620    ALT 46 (H) 05/09/2018 1559   ALT 149 (H) 07/29/2015 1620   ALKPHOS 116 05/09/2018 1559   ALKPHOS 74 07/29/2015 1620   BILITOT 0.4 05/09/2018 1559   BILITOT 0.38 07/29/2015 1620   GFRNONAA >60 08/13/2020 0528   GFRAA >60 09/08/2019 0719    GFR Estimated Creatinine Clearance: 112.7 mL/min (by C-G formula based on SCr of 0.74 mg/dL). No results for input(s): LIPASE, AMYLASE in the last 72 hours. No results for input(s): CKTOTAL, CKMB, CKMBINDEX, TROPONINI in the last 72 hours. Invalid input(s): POCBNP Recent Labs    08/12/20 1539  DDIMER 2.50*   Recent Labs    08/11/20 1851  HGBA1C 10.1*   Recent Labs    08/13/20 0528  CHOL 153  HDL 48  LDLCALC 91  TRIG 71  CHOLHDL 3.2  LDLDIRECT 99.4*   No results for input(s): TSH, T4TOTAL, T3FREE, THYROIDAB in the last 72 hours.  Invalid input(s): FREET3 No results for input(s): VITAMINB12, FOLATE, FERRITIN, TIBC, IRON, RETICCTPCT in the last 72 hours. Coags: No results for input(s): INR in the last 72 hours.  Invalid input(s): PT Microbiology: No results found for this or any previous visit (from the past 240 hour(s)).  FURTHER DISCHARGE INSTRUCTIONS:  Get Medicines reviewed and adjusted: Please take all your medications with you for your next visit with your Primary MD  Laboratory/radiological data: Please request your Primary MD to go over all hospital tests and procedure/radiological results at the follow up, please ask your Primary MD to get all Hospital records sent to his/her office.  In some cases, they will be blood work, cultures and biopsy results pending at the time of your discharge. Please request that your primary care M.D. goes through all the records of your hospital data and follows up on these results.  Also Note the following: If you experience worsening of your admission symptoms, develop shortness of breath, life threatening emergency, suicidal or homicidal thoughts you must seek medical attention  immediately by calling 911 or calling your MD immediately  if symptoms less severe.  You must read complete instructions/literature along with all the possible adverse reactions/side effects for all the Medicines you take and that have been prescribed to you. Take any new Medicines after you have completely understood and accpet all the possible adverse reactions/side effects.   Do not drive when taking Pain medications or sleeping medications (Benzodaizepines)  Do not take more than prescribed Pain, Sleep and Anxiety Medications. It is not advisable to combine anxiety,sleep and pain medications without talking with your primary care practitioner  Special Instructions: If you have smoked or chewed Tobacco  in the last 2 yrs please stop smoking, stop any regular Alcohol  and or any Recreational drug use.  Wear Seat belts while driving.  Please note: You were cared for by a hospitalist during your hospital stay. Once you are discharged, your primary care physician will handle any further medical issues. Please note that NO REFILLS for any discharge medications will be authorized once you are discharged, as it is imperative that you return to your primary care physician (or establish a relationship with a primary care physician if  you do not have one) for your post hospital discharge needs so that they can reassess your need for medications and monitor your lab values.  Total Time spent coordinating discharge including counseling, education and face to face time equals 35 minutes.  SignedOren Binet 08/13/2020 11:29 AM

## 2020-08-13 NOTE — Plan of Care (Signed)

## 2020-08-13 NOTE — Progress Notes (Signed)
D/C instructions given and reviewed. Awaiting TOC med delivery.

## 2020-08-28 ENCOUNTER — Ambulatory Visit: Payer: BC Managed Care – PPO | Admitting: Cardiology

## 2020-08-29 ENCOUNTER — Ambulatory Visit: Payer: BC Managed Care – PPO | Admitting: Cardiology

## 2020-08-30 ENCOUNTER — Encounter: Payer: Self-pay | Admitting: Cardiology

## 2020-08-30 ENCOUNTER — Ambulatory Visit: Payer: BC Managed Care – PPO | Admitting: Cardiology

## 2020-08-30 ENCOUNTER — Other Ambulatory Visit: Payer: Self-pay

## 2020-08-30 VITALS — BP 148/95 | HR 94 | Temp 97.6°F | Resp 16 | Ht 62.0 in | Wt 294.0 lb

## 2020-08-30 DIAGNOSIS — R0609 Other forms of dyspnea: Secondary | ICD-10-CM

## 2020-08-30 DIAGNOSIS — R0683 Snoring: Secondary | ICD-10-CM

## 2020-08-30 DIAGNOSIS — E1165 Type 2 diabetes mellitus with hyperglycemia: Secondary | ICD-10-CM

## 2020-08-30 DIAGNOSIS — R0600 Dyspnea, unspecified: Secondary | ICD-10-CM

## 2020-08-30 DIAGNOSIS — I6381 Other cerebral infarction due to occlusion or stenosis of small artery: Secondary | ICD-10-CM

## 2020-08-30 DIAGNOSIS — Z794 Long term (current) use of insulin: Secondary | ICD-10-CM

## 2020-08-30 DIAGNOSIS — Z6841 Body Mass Index (BMI) 40.0 and over, adult: Secondary | ICD-10-CM

## 2020-08-30 DIAGNOSIS — I639 Cerebral infarction, unspecified: Secondary | ICD-10-CM

## 2020-08-30 DIAGNOSIS — Z8249 Family history of ischemic heart disease and other diseases of the circulatory system: Secondary | ICD-10-CM

## 2020-08-30 DIAGNOSIS — I10 Essential (primary) hypertension: Secondary | ICD-10-CM

## 2020-08-30 MED ORDER — EDARBYCLOR 40-12.5 MG PO TABS: 1.0000 | ORAL_TABLET | Freq: Every day | ORAL | 0 refills | Status: DC

## 2020-08-30 NOTE — Progress Notes (Signed)
ID:  Lindsay Gomez, DOB Dec 01, 1970, MRN [id number]  PCP:  [name], DO  Cardiologist:  Rex Kras, DO, [facility] (established care 09/26/2019) Former Cardiology Providers: Larey Dresser, MD (2013)  Date: 08/30/20 Last Office Visit: July 2021  Chief Complaint  Patient presents with   Shortness of Breath   Acute on chronic diastolic CHF    Hypertension   Hospitalization Follow-up    2 week    HPI  Lindsay Gomez is a 50 y.o. female who presents to the office with a chief complaint of " status post hospitalization." Patient's past medical history and cardiovascular risk factors include: Benign essential hypertension, insulin-dependent diabetes mellitus type 2, hyperlipidemia, history of stroke, obesity due to excess calorie.   Patient was recently hospitalized in May 2022 due to shortness of breath and hypertensive emergency.  Cardiology was consulted for heart failure management.  It appears that patient ran out of her blood pressure pills in November/December 2021 and when the prescription was refilled that she was not able to afford it due to high co-pays.  Since then her blood pressures have been elevated when she came to the hospital her blood pressure was noted to be 218/117 with chest x-ray noting mild interstitial pulmonary edema.  Cardiology was consulted for heart failure management.  Based on the history, blood pressure on admission, and clinical judgment patient did not have congestive heart failure but pulmonary edema due to hypertensive emergency.  Patient's blood pressure were improving and was later discharged home for a follow-up in 2 weeks.  Since discharge patient states that her shortness of breath is improved significantly.  If present only with effort related activities.  She is compliant with her medical therapy.  She continues to state that when she was on Edarbclor her BP were very well controlled.  She checks her blood pressures once a week and they are  usually between 130-140 mmHg.  When she wakes up in the morning patient states that her blood pressures are usually higher, has a dry mouth, has been told that she snores, and has morning headaches.   Patient has not been evaluated for sleep apnea in the past.  FUNCTIONAL STATUS: No structured exercise program or daily routine. But she has started to be more active since noticing her weight has gone up.    ALLERGIES: Allergies  Allergen Reactions   Lisinopril Other (See Comments)    Cough     MEDICATION LIST PRIOR TO VISIT: Current Meds  Medication Sig   aspirin (ASPIRIN 81) 81 MG EC tablet Take 1 tablet (81 mg total) by mouth daily. Swallow whole.   atorvastatin (LIPITOR) 20 MG tablet Take 20 mg by mouth at bedtime.   Azilsartan-Chlorthalidone (EDARBYCLOR) 40-12.5 MG TABS Take 1 tablet by mouth daily.   Blood Glucose Monitoring Suppl (ONE TOUCH ULTRA MINI) w/Device KIT Use device to test blood sugars 1-4 times daily as instructed.   calcium carbonate (TUMS - DOSED IN MG ELEMENTAL CALCIUM) 500 MG chewable tablet Chew 1-2 tablets by mouth as needed for indigestion or heartburn.   carvedilol (COREG) 6.25 MG tablet Take 1 tablet (6.25 mg total) by mouth 2 (two) times daily with a meal.   Continuous Blood Gluc Sensor (FREESTYLE LIBRE 14 DAY SENSOR) MISC APPLY AS DIRECTED AND CHANGE EVERY 14 DAYS.   glucose blood test strip Use as instructed   insulin degludec (TRESIBA FLEXTOUCH) 100 UNIT/ML FlexTouch Pen Inject 15 Units into the skin at bedtime.   Insulin Pen  Needle 32G X 4 MM MISC 1 Device by Does not apply route 2 (two) times daily.   Lancets MISC Use one lancet per test to check blood sugar 1-4 times daily. Dx code:E11.9   OZEMPIC, 1 MG/DOSE, 4 MG/3ML SOPN Inject 1 mg into the skin once a week.   pioglitazone (ACTOS) 30 MG tablet TAKE 1 TABLET ONCE DAILY.   spironolactone (ALDACTONE) 50 MG tablet Take 1 tablet (50 mg total) by mouth daily.   [DISCONTINUED] irbesartan (AVAPRO) 150 MG  tablet Take 1 tablet (150 mg total) by mouth daily.     PAST MEDICAL HISTORY: Past Medical History:  Diagnosis Date   Allergy    rhinitis   Anemia    Anxiety    Diabetes mellitus without complication ([place])    Enlarged heart    Hyperlipidemia    Hypertension    Morbid obesity ([place])    Shortness of breath dyspnea    with low iron    PAST SURGICAL HISTORY: Past Surgical History:  Procedure Laterality Date   ABDOMINAL HYSTERECTOMY N/A 01/29/2015   Procedure: TOTAL ABDOMINAL HYSTERECTOMY ;  Surgeon: Ena Dawley, MD;  Location: [facility];  Service: Gynecology;  Laterality: N/A;   BILATERAL SALPINGECTOMY Bilateral 01/29/2015   Procedure: BILATERAL SALPINGECTOMY;  Surgeon: Ena Dawley, MD;  Location: [facility];  Service: Gynecology;  Laterality: Bilateral;   [place]   ? arthroscopic/ right    FAMILY HISTORY: The patient family history includes Cancer in her paternal grandmother; Coronary artery disease in her father; Diabetes in her father and mother; Heart attack in her father; Heart failure in her father; Hyperlipidemia in an other family member; Hypertension in her mother and another family member.  SOCIAL HISTORY:  The patient  reports that she has never smoked. She has never used smokeless tobacco. She reports that she does not drink alcohol and does not use drugs.  REVIEW OF SYSTEMS: Review of Systems  Constitutional: Negative for chills and fever.  HENT:  Negative for hoarse voice and nosebleeds.   Eyes:  Negative for discharge, double vision and pain.  Cardiovascular:  Negative for chest pain, claudication, dyspnea on exertion, leg swelling, near-syncope, orthopnea, palpitations, paroxysmal nocturnal dyspnea and syncope.  Respiratory:  Negative for hemoptysis and shortness of breath.   Musculoskeletal:  Negative for muscle cramps and myalgias.  Gastrointestinal:  Negative for abdominal pain, constipation, diarrhea, hematemesis,  hematochezia, melena, nausea and vomiting.  Neurological:  Negative for dizziness and light-headedness.   PHYSICAL EXAM: Vitals with BMI 08/30/2020 08/13/2020 08/13/2020  Height _0  - -  Weight 294 lbs - -  BMI 38.46 - -  Systolic 659 935 701  Diastolic 95 83 93  Pulse 94 83 91   CONSTITUTIONAL: Appears older than stated age, hemodynamically stable, no acute distress.    SKIN: Skin is warm and dry. No rash noted. No cyanosis. No pallor. No jaundice HEAD: Normocephalic and atraumatic.  EYES: No scleral icterus MOUTH/THROAT: Moist oral membranes.  NECK: No JVD present. No thyromegaly noted. No carotid bruits  LYMPHATIC: No visible cervical adenopathy.  CHEST Normal respiratory effort. No intercostal retractions  LUNGS: Clear to auscultation bilaterally.  No stridor. No wheezes. No rales.  CARDIOVASCULAR: Regular rate and rhythm, positive S1-S2, no murmurs rubs or gallops appreciated. ABDOMINAL: Obese, soft, nontender, nondistended, positive bowel sounds in all 4 quadrants, no apparent ascites.  EXTREMITIES: No peripheral edema  HEMATOLOGIC: No significant bruising NEUROLOGIC: Oriented to person, place, and  time. Nonfocal. Normal muscle tone.  PSYCHIATRIC: Normal mood and affect. Normal behavior. Cooperative  Radiology:  MRI HEAD WITHOUT CONTRAST 02/2017: Subcentimeter acute infarction in the left lateral thalamus.  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  CARDIAC DATABASE: EKG: 09/26/2019: Normal sinus rhythm, 83 bpm, normal axis, without underlying ischemia or injury pattern.  Echocardiogram: 08/12/2020: LVEF 50-55%, no regional wall motion abnormalities, moderate LVH, normal right ventricular size and function, mild MR.  Stress Testing: None  Heart Catheterization: None  Carotid duplex: 02/2017:  Right Carotid: There is evidence in the right ICA of a 1-39% stenosis.  Left Carotid:  There is evidence in the left ICA of a 1-39% stenosis.  Vertebrals: Both vertebral arteries were patent with antegrade flow.   [place] LE Venous Duplex 09/08/2019:   RIGHT: No evidence of common femoral vein obstruction.  LEFT:  There is no evidence of deep vein thrombosis in the lower extremity. No cystic structure found in the popliteal fossa.   LABORATORY DATA: CBC Latest Ref Rng & Units 08/11/2020 09/08/2019 05/09/2018  WBC 4.0 - 10.5 K/uL 5.9 6.1 7.5  Hemoglobin 12.0 - 15.0 g/dL 15.0 14.2 16.5(H)  Hematocrit 36.0 - 46.0 % 46.4(H) 45.1 49.6(H)  Platelets 150 - 400 K/uL 262 257 283.0    CMP Latest Ref Rng & Units 08/13/2020 08/12/2020 08/11/2020  Glucose 70 - 99 mg/dL 233(H) 229(H) 237(H)  BUN 6 - 20 mg/dL 18 7 5(L)  Creatinine 0.44 - 1.00 mg/dL 0.74 0.64 0.62  Sodium 135 - 145 mmol/L 136 136 138  Potassium 3.5 - 5.1 mmol/L 3.4(L) 3.6 3.3(L)  Chloride 98 - 111 mmol/L 100 101 99  CO2 22 - 32 mmol/L _0 Calcium 8.9 - 10.3 mg/dL 8.9 8.9 8.7(L)  Total Protein 6.0 - 8.3 g/dL - - -  Total Bilirubin 0.2 - 1.2 mg/dL - - -  Alkaline Phos 39 - 117 U/L - - -  AST 0 - 37 U/L - - -  ALT 0 - 35 U/L - - -    Lipid Panel     Component Value Date/Time   CHOL 153 08/13/2020 0528   TRIG 71 08/13/2020 0528   HDL 48 08/13/2020 0528   CHOLHDL 3.2 08/13/2020 0528   VLDL 14 08/13/2020 0528   LDLCALC 91 08/13/2020 0528   LDLDIRECT 99.4 (H) 08/13/2020 0528    BMP Recent Labs    09/08/19 0719 08/11/20 1212 08/12/20 0222 08/13/20 0528  NA 135 138 136 136  K 4.3 3.3* 3.6 3.4*  CL 98 99 101 100  CO2 _1 GLUCOSE 259* 237* 229* 233*  BUN 10 5* 7 18  CREATININE 0.80 0.62 0.64 0.74  CALCIUM 8.7* 8.7* 8.9 8.9  GFRNONAA >60 >60 >60 >60  GFRAA >60  --   --   --     HEMOGLOBIN A1C Lab Results  Component Value Date   HGBA1C 10.1 (H) 08/11/2020   MPG 243 08/11/2020    IMPRESSION:    ICD-10-CM   1. Dyspnea on exertion  R06.00 EKG 12-Lead    Ambulatory referral to Neurology     Azilsartan-Chlorthalidone (EDARBYCLOR) 40-12.5 MG TABS    Basic metabolic panel    Magnesium    2. Essential hypertension  I10 Ambulatory referral to Neurology    3. Snoring  R06.83 Ambulatory referral to Neurology    4. Type 2 diabetes mellitus with hyperglycemia, with long-term current use  of insulin ([place])  E11.65    Z79.4     5. Long-term insulin use ([facility])  Z79.4     6. Thalamic stroke (H[facility])  I63.9     7. Family history of premature CAD  Z82.49     8. Class 3 severe obesity due to excess calories with serious comorbidity and body mass index (BMI) of 50.0 to 59.9 in adult [facility])  E66.01    Z68.43        RECOMMENDATIONS: YOUSRA IVENS is a 50 y.o. female whose past medical history and cardiac risk factors include: Benign essential hypertension, insulin-dependent diabetes mellitus type 2, hyperlipidemia, history of stroke, obesity due to excess calorie.   Dyspnea on exertion: Multifactorial. Improving. Educated on the importance of better blood pressure management. Most recent echocardiogram performed in May 2022. In the past have recommended an ischemic evaluation but not completed due to it being cost prohibitive.   Monitor for now.  Benign essential hypertension: Office blood pressures improving. Discontinue Avapro Patient given samples for Edarbyclor for a month.  She is asked to do blood work in 1 week to evaluate kidney function and electrolytes.  If kidney function and electrolytes are stable she can continue this medication.  If she has difficulty affording the medication we could consider patient assistance if available. Low-salt diet recommended. Educated on the importance of improving physical activity with a goal of 30 minutes of moderate intensity exercise 5 days a week. Given her history of hypertensive emergency requiring hospitalization and symptoms of snoring, morning headaches, dry mouth upon waking up, and elevated blood pressures in the morning we  discussed considering obstructive sleep apnea evaluation.  Patient is requesting for her to be referred.  Family history of premature coronary artery disease: Check CT calcium score for further risk stratification   Insulin-dependent diabetes mellitus type 2 with complications of hyperglycemia:  Currently managed per primary team. Consider the addition of Jardiance given her history of diabetes.  Will defer to primary team at this time.  History of thalamic stroke: Continue aspirin and statin therapy.    Obesity, due to excess calories: Body mass index is 53.77 kg/m. I reviewed with the patient the importance of diet, regular physical activity/exercise, weight loss.   Patient is educated on increasing physical activity gradually as tolerated.  With the goal of moderate intensity exercise for 30 minutes a day 5 days a week.  FINAL MEDICATION LIST END OF ENCOUNTER: Meds ordered this encounter  Medications   Azilsartan-Chlorthalidone (EDARBYCLOR) 40-12.5 MG TABS    Sig: Take 1 tablet by mouth daily.    Dispense:  90 tablet    Refill:  0    Medications Discontinued During This Encounter  Medication Reason   irbesartan (AVAPRO) 150 MG tablet Discontinued by provider     Current Outpatient Medications:    aspirin (ASPIRIN 81) 81 MG EC tablet, Take 1 tablet (81 mg total) by mouth daily. Swallow whole., Disp: 30 tablet, Rfl: 1   atorvastatin (LIPITOR) 20 MG tablet, Take 20 mg by mouth at bedtime., Disp: , Rfl:    Azilsartan-Chlorthalidone (EDARBYCLOR) 40-12.5 MG TABS, Take 1 tablet by mouth daily., Disp: 90 tablet, Rfl: 0   Blood Glucose Monitoring Suppl (ONE TOUCH ULTRA MINI) w/Device KIT, Use device to test blood sugars 1-4 times daily as instructed., Disp: 1 each, Rfl: 0   calcium carbonate (TUMS - DOSED IN MG ELEMENTAL CALCIUM) 500 MG chewable tablet, Chew 1-2 tablets by mouth as needed for indigestion or heartburn.,  Disp: , Rfl:    carvedilol (COREG) 6.25 MG tablet, Take 1 tablet  (6.25 mg total) by mouth 2 (two) times daily with a meal., Disp: 60 tablet, Rfl: 0   Continuous Blood Gluc Sensor (FREESTYLE LIBRE 14 DAY SENSOR) MISC, APPLY AS DIRECTED AND CHANGE EVERY 14 DAYS., Disp: 2 each, Rfl: 11   glucose blood test strip, Use as instructed, Disp: 100 each, Rfl: 12   insulin degludec (TRESIBA FLEXTOUCH) 100 UNIT/ML FlexTouch Pen, Inject 15 Units into the skin at bedtime., Disp: , Rfl:    Insulin Pen Needle 32G X 4 MM MISC, 1 Device by Does not apply route 2 (two) times daily., Disp: 100 each, Rfl: 11   Lancets MISC, Use one lancet per test to check blood sugar 1-4 times daily. Dx code:E11.9, Disp: 100 each, Rfl: 5   OZEMPIC, 1 MG/DOSE, 4 MG/3ML SOPN, Inject 1 mg into the skin once a week., Disp: , Rfl:    pioglitazone (ACTOS) 30 MG tablet, TAKE 1 TABLET ONCE DAILY., Disp: 30 tablet, Rfl: 0   spironolactone (ALDACTONE) 50 MG tablet, Take 1 tablet (50 mg total) by mouth daily., Disp: 30 tablet, Rfl: 0  Orders Placed This Encounter  Procedures   Basic metabolic panel   Magnesium   Ambulatory referral to Neurology   EKG 12-Lead   --Continue cardiac medications as reconciled in final medication list. --Return in about 3 months (around 11/30/2020) for Follow up, BP. Or sooner if needed. --Continue follow-up with your primary care physician regarding the management of your other chronic comorbid conditions.  Patient's questions and concerns were addressed to her satisfaction. She voices understanding of the instructions provided during this encounter.   This note was created using a voice recognition software as a result there may be grammatical errors inadvertently enclosed that do not reflect the nature of this encounter. Every attempt is made to correct such errors.  Total encounter time 45 minutes. *Total Encounter Time as defined by the Centers for Medicare and Medicaid Services includes, in addition to the face-to-face time of a patient visit (documented in the note  above) non-face-to-face time: obtaining and reviewing outside history, ordering and reviewing medications, tests or procedures, care coordination (communications with other health care professionals or caregivers) and documentation in the medical record.   Rex Kras, [place], [facility]  Pager: [phone] Office: [phone]

## 2020-09-02 ENCOUNTER — Other Ambulatory Visit: Payer: Self-pay

## 2020-09-02 DIAGNOSIS — R0609 Other forms of dyspnea: Secondary | ICD-10-CM

## 2020-09-02 DIAGNOSIS — R0600 Dyspnea, unspecified: Secondary | ICD-10-CM

## 2020-09-18 ENCOUNTER — Other Ambulatory Visit: Payer: Self-pay

## 2020-09-23 ENCOUNTER — Other Ambulatory Visit (HOSPITAL_COMMUNITY): Payer: Self-pay

## 2020-09-24 NOTE — Telephone Encounter (Signed)
Yes refill it

## 2020-09-24 NOTE — Telephone Encounter (Signed)
Not originally prescribed by you, can we refill until her appointment with you on the 22nd of September? Please advise.

## 2020-09-25 MED ORDER — CARVEDILOL 6.25 MG PO TABS: 6.2500 mg | ORAL_TABLET | Freq: Two times a day (BID) | ORAL | 0 refills | Status: DC

## 2020-10-31 ENCOUNTER — Institutional Professional Consult (permissible substitution): Payer: Self-pay | Admitting: Neurology

## 2020-11-07 ENCOUNTER — Institutional Professional Consult (permissible substitution): Payer: Self-pay | Admitting: Neurology

## 2020-11-11 ENCOUNTER — Encounter: Payer: Self-pay | Admitting: Neurology

## 2020-12-05 ENCOUNTER — Ambulatory Visit: Payer: BC Managed Care – PPO | Admitting: Cardiology

## 2020-12-16 ENCOUNTER — Encounter: Payer: Self-pay | Admitting: Cardiology

## 2020-12-16 ENCOUNTER — Other Ambulatory Visit: Payer: Self-pay

## 2020-12-16 ENCOUNTER — Ambulatory Visit: Payer: BC Managed Care – PPO | Admitting: Cardiology

## 2020-12-16 VITALS — BP 178/98 | HR 88 | Temp 97.4°F | Resp 16 | Ht 62.0 in | Wt 287.6 lb

## 2020-12-16 DIAGNOSIS — Z8249 Family history of ischemic heart disease and other diseases of the circulatory system: Secondary | ICD-10-CM

## 2020-12-16 DIAGNOSIS — R0683 Snoring: Secondary | ICD-10-CM

## 2020-12-16 DIAGNOSIS — I10 Essential (primary) hypertension: Secondary | ICD-10-CM

## 2020-12-16 DIAGNOSIS — Z794 Long term (current) use of insulin: Secondary | ICD-10-CM

## 2020-12-16 DIAGNOSIS — I6381 Other cerebral infarction due to occlusion or stenosis of small artery: Secondary | ICD-10-CM

## 2020-12-16 DIAGNOSIS — R0609 Other forms of dyspnea: Secondary | ICD-10-CM

## 2020-12-16 MED ORDER — IRBESARTAN 300 MG PO TABS: 300.0000 mg | ORAL_TABLET | Freq: Every day | ORAL | 0 refills | Status: DC

## 2020-12-16 NOTE — Progress Notes (Signed)
ID:  MOMO BRAUN, DOB 10/19/1970, MRN [id number]  PCP:  [name], DO  Cardiologist:  Rex Kras, DO, [facility] (established care 09/26/2019) Former Cardiology Providers: Larey Dresser, MD (2013)  Date: 12/16/20 Last Office Visit: 08/30/2020  Chief Complaint  Patient presents with   Hypertension   Follow-up    HPI  Lindsay Gomez is a 50 y.o. female who presents to the office with a chief complaint of " 85-monthfollow-up for blood pressure management." Patient's past medical history and cardiovascular risk factors include: Benign essential hypertension, insulin-dependent diabetes mellitus type 2, hyperlipidemia, history of stroke, obesity due to excess calorie.   Hospitalized in May 2022 for shortness of breath and hypertensive emergency cardiology was called for heart failure management; however, given the patient's blood pressure on arrival and chest x-ray findings it appeared that the patient was in flash pulmonary edema as opposed to congestive heart failure.  She was last seen in the office approximately 4 months ago during which her medications were further uptitrated, recommended to follow-up with sleep medicine for evaluation of sleep apnea and undergo coronary calcium score for further restratification.  Patient is office blood pressures are not well controlled; however, patient states that she checks her blood pressure via a [employer] watch and her systolic blood pressures at home around 120 mmHg.  I have requested her to invest in a blood pressure cuff to accurately measure her BP.  She is trying to consume a low-salt diet.  For reasons unknown she is no longer on spironolactone.  With lifestyle changes and medical therapy patient has lost 7 pounds since last office encounter.  No hospitalizations or urgent care visits for cardiovascular symptoms.   FUNCTIONAL STATUS: No structured exercise program or daily routine.   ALLERGIES: Allergies  Allergen Reactions    Lisinopril Other (See Comments)    Cough     MEDICATION LIST PRIOR TO VISIT: Current Meds  Medication Sig   aspirin (ASPIRIN 81) 81 MG EC tablet Take 1 tablet (81 mg total) by mouth daily. Swallow whole.   atorvastatin (LIPITOR) 20 MG tablet Take 20 mg by mouth at bedtime.   Blood Glucose Monitoring Suppl (ONE TOUCH ULTRA MINI) w/Device KIT Use device to test blood sugars 1-4 times daily as instructed.   calcium carbonate (TUMS - DOSED IN MG ELEMENTAL CALCIUM) 500 MG chewable tablet Chew 1-2 tablets by mouth as needed for indigestion or heartburn.   carvedilol (COREG) 6.25 MG tablet Take 1 tablet (6.25 mg total) by mouth 2 (two) times daily with a meal.   Continuous Blood Gluc Sensor (FREESTYLE LIBRE 14 DAY SENSOR) MISC APPLY AS DIRECTED AND CHANGE EVERY 14 DAYS.   Insulin Pen Needle 32G X 4 MM MISC 1 Device by Does not apply route 2 (two) times daily.   Lancets MISC Use one lancet per test to check blood sugar 1-4 times daily. Dx code:E11.9   OZEMPIC, 1 MG/DOSE, 4 MG/3ML SOPN Inject 1 mg into the skin once a week.   pioglitazone (ACTOS) 30 MG tablet TAKE 1 TABLET ONCE DAILY.   [DISCONTINUED] irbesartan (AVAPRO) 150 MG tablet Take 150 mg by mouth daily.     PAST MEDICAL HISTORY: Past Medical History:  Diagnosis Date   Allergy    rhinitis   Anemia    Anxiety    Diabetes mellitus without complication ([facility]    Enlarged heart    Hyperlipidemia    Hypertension    Morbid obesity ([place]    Shortness of  breath dyspnea    with low iron    PAST SURGICAL HISTORY: Past Surgical History:  Procedure Laterality Date   ABDOMINAL HYSTERECTOMY N/A 01/29/2015   Procedure: TOTAL ABDOMINAL HYSTERECTOMY ;  Surgeon: Ena Dawley, MD;  Location: [facility];  Service: Gynecology;  Laterality: N/A;   BILATERAL SALPINGECTOMY Bilateral 01/29/2015   Procedure: BILATERAL SALPINGECTOMY;  Surgeon: Ena Dawley, MD;  Location: [facility];  Service: Gynecology;  Laterality: Bilateral;   [place]   ? arthroscopic/ right    FAMILY HISTORY: The patient family history includes Cancer in her paternal grandmother; Coronary artery disease in her father; Diabetes in her father and mother; Heart attack in her father; Heart failure in her father; Hyperlipidemia in an other family member; Hypertension in her mother and another family member.  SOCIAL HISTORY:  The patient  reports that she has never smoked. She has never used smokeless tobacco. She reports that she does not drink alcohol and does not use drugs.  REVIEW OF SYSTEMS: Review of Systems  Constitutional: Negative for chills and fever.  HENT:  Negative for hoarse voice and nosebleeds.   Eyes:  Negative for discharge, double vision and pain.  Cardiovascular:  Negative for chest pain, claudication, dyspnea on exertion, leg swelling, near-syncope, orthopnea, palpitations, paroxysmal nocturnal dyspnea and syncope.  Respiratory:  Negative for hemoptysis and shortness of breath.   Musculoskeletal:  Negative for muscle cramps and myalgias.  Gastrointestinal:  Negative for abdominal pain, constipation, diarrhea, hematemesis, hematochezia, melena, nausea and vomiting.  Neurological:  Negative for dizziness and light-headedness.   PHYSICAL EXAM: Vitals with BMI 12/16/2020 08/30/2020 08/13/2020  Height 5' 2"  5' 2"  -  Weight 287 lbs 10 oz 294 lbs -  BMI 27.25 36.64 -  Systolic 403 474 259  Diastolic 563 95 83  Pulse 88 94 83   CONSTITUTIONAL: Appears older than stated age, hemodynamically stable, no acute distress.    SKIN: Skin is warm and dry. No rash noted. No cyanosis. No pallor. No jaundice HEAD: Normocephalic and atraumatic.  EYES: No scleral icterus MOUTH/THROAT: Moist oral membranes.  NECK: No JVD present. No thyromegaly noted. No carotid bruits  LYMPHATIC: No visible cervical adenopathy.  CHEST Normal respiratory effort. No intercostal retractions  LUNGS: Clear to auscultation bilaterally.  No stridor. No  wheezes. No rales.  CARDIOVASCULAR: Regular rate and rhythm, positive S1-S2, no murmurs rubs or gallops appreciated. ABDOMINAL: Obese, soft, nontender, nondistended, positive bowel sounds in all 4 quadrants, no apparent ascites.  EXTREMITIES: No peripheral edema  HEMATOLOGIC: No significant bruising NEUROLOGIC: Oriented to person, place, and time. Nonfocal. Normal muscle tone.  PSYCHIATRIC: Normal mood and affect. Normal behavior. Cooperative  Radiology:  MRI HEAD WITHOUT CONTRAST 02/2017: Subcentimeter acute infarction in the left lateral thalamus.  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  CARDIAC DATABASE: EKG: 09/26/2019: Normal sinus rhythm, 83 bpm, normal axis, without underlying ischemia or injury pattern.  Echocardiogram: 08/12/2020: LVEF 50-55%, no regional wall motion abnormalities, moderate LVH, normal right ventricular size and function, mild MR.  Stress Testing: None  Heart Catheterization: None  Carotid duplex: 02/2017:  Right Carotid: There is evidence in the right ICA of a 1-39% stenosis.  Left Carotid: There is evidence in the left ICA of a 1-39% stenosis.  Vertebrals: Both vertebral arteries were patent with antegrade flow.   [place] LE Venous Duplex 09/08/2019:   RIGHT: No evidence of common femoral  vein obstruction.  LEFT:  There is no evidence of deep vein thrombosis in the lower extremity. No cystic structure found in the popliteal fossa.   LABORATORY DATA: CBC Latest Ref Rng & Units 08/11/2020 09/08/2019 05/09/2018  WBC 4.0 - 10.5 K/uL 5.9 6.1 7.5  Hemoglobin 12.0 - 15.0 g/dL 15.0 14.2 16.5(H)  Hematocrit 36.0 - 46.0 % 46.4(H) 45.1 49.6(H)  Platelets 150 - 400 K/uL 262 257 283.0    CMP Latest Ref Rng & Units 08/13/2020 08/12/2020 08/11/2020  Glucose 70 - 99 mg/dL 233(H) 229(H) 237(H)  BUN 6 - 20 mg/dL 18 7 5(L)  Creatinine 0.44 - 1.00 mg/dL 0.74 0.64 0.62  Sodium 135 - 145  mmol/L 136 136 138  Potassium 3.5 - 5.1 mmol/L 3.4(L) 3.6 3.3(L)  Chloride 98 - 111 mmol/L 100 101 99  CO2 22 - 32 mmol/L 26 27 29   Calcium 8.9 - 10.3 mg/dL 8.9 8.9 8.7(L)  Total Protein 6.0 - 8.3 g/dL - - -  Total Bilirubin 0.2 - 1.2 mg/dL - - -  Alkaline Phos 39 - 117 U/L - - -  AST 0 - 37 U/L - - -  ALT 0 - 35 U/L - - -    Lipid Panel     Component Value Date/Time   CHOL 153 08/13/2020 0528   TRIG 71 08/13/2020 0528   HDL 48 08/13/2020 0528   CHOLHDL 3.2 08/13/2020 0528   VLDL 14 08/13/2020 0528   LDLCALC 91 08/13/2020 0528   LDLDIRECT 99.4 (H) 08/13/2020 0528    BMP Recent Labs    08/11/20 1212 08/12/20 0222 08/13/20 0528  NA 138 136 136  K 3.3* 3.6 3.4*  CL 99 101 100  CO2 29 27 26   GLUCOSE 237* 229* 233*  BUN 5* 7 18  CREATININE 0.62 0.64 0.74  CALCIUM 8.7* 8.9 8.9  GFRNONAA >60 >60 >60    HEMOGLOBIN A1C Lab Results  Component Value Date   HGBA1C 10.1 (H) 08/11/2020   MPG 243 08/11/2020    IMPRESSION:    ICD-10-CM   1. Dyspnea on exertion  R06.09     2. Essential hypertension  I10 irbesartan (AVAPRO) 300 MG tablet    Basic metabolic panel    Magnesium    3. Snoring  R06.83     4. Type 2 diabetes mellitus with hyperglycemia, with long-term current use of insulin (HCC)  E11.65 CT CARDIAC SCORING ([facility] LOCATIONS ONLY)   Z79.4     5. Long-term insulin use (HCC)  Z79.4 CT CARDIAC SCORING ([facility] LOCATIONS ONLY)    6. Thalamic stroke ([place])  I63.81     7. Family history of premature CAD  Z82.49 CT CARDIAC SCORING ([facility] LOCATIONS ONLY)    8. Class 3 severe obesity due to excess calories with serious comorbidity and body mass index (BMI) of 50.0 to 59.9 in adult [facility])  E66.01    Z68.43        RECOMMENDATIONS: Lindsay Gomez is a 50 y.o. female whose past medical history and cardiac risk factors include: Benign essential hypertension, insulin-dependent diabetes mellitus type 2, hyperlipidemia, history of stroke, obesity due to excess calorie.    Essential hypertension Office blood pressure not well controlled. Will increase Avapro to 300 mg p.o. daily Patient is asked to take some of her antihypertensive medications in the morning and some at night.  Currently she is taking all her medications at night before going to bed. Low-salt diet recommended Requested that she keep a log of her  blood pressures at home and to bring it in at next office visit for review and drop off a copy during the end of this week to see if additional medication changes are warranted. Medication reconciliation was difficult to perform as she did not bring her medication bottles for review. Patient has done well on [place]; however, unable to afford it due to insurance coverage. Patient states that she did not follow-up with the sleep study as " nobody has called me."  I have asked her to reach out to Dr. Edwena Felty office if needed.  Dyspnea on exertion Multifactorial. Blood pressure management as discussed above. With regards to ischemic evaluation we will start off with a calcium score for further restratification. Echocardiogram results reviewed Will defer further work-up of noncardiac causes of her shortness of breath to her PCP.  Snoring Highly recommended sleep study to evaluate for sleep apnea given her uncontrolled hypertension, body habitus, snoring.  Referral was provided during last office visit.  Type 2 diabetes mellitus with hyperglycemia, with long-term current use of insulin ([place]) Currently managed by primary care provider. Currently on ARB and statin. Educated in the importance of glycemic control.  History of thalamic stroke ([place]) Currently on aspirin and statin therapy. Educated importance of secondary prevention.  Family history of premature CAD Coronary calcium score for further risk stratification.  Currently on aspirin and statin therapy.  Class 3 severe obesity due to excess calories with serious comorbidity and body mass  index (BMI) of 50.0 to 59.9 in adult [facility]) Body mass index is 52.6 kg/m. Patient has lost 7 pounds since last office visit, she is congratulated on her efforts. I reviewed with the patient the importance of diet, regular physical activity/exercise, weight loss.   Patient is educated on increasing physical activity gradually as tolerated.  With the goal of moderate intensity exercise for 30 minutes a day 5 days a week.  FINAL MEDICATION LIST END OF ENCOUNTER: Meds ordered this encounter  Medications   irbesartan (AVAPRO) 300 MG tablet    Sig: Take 1 tablet (300 mg total) by mouth daily.    Dispense:  30 tablet    Refill:  0     Current Outpatient Medications:    aspirin (ASPIRIN 81) 81 MG EC tablet, Take 1 tablet (81 mg total) by mouth daily. Swallow whole., Disp: 30 tablet, Rfl: 1   atorvastatin (LIPITOR) 20 MG tablet, Take 20 mg by mouth at bedtime., Disp: , Rfl:    Blood Glucose Monitoring Suppl (ONE TOUCH ULTRA MINI) w/Device KIT, Use device to test blood sugars 1-4 times daily as instructed., Disp: 1 each, Rfl: 0   calcium carbonate (TUMS - DOSED IN MG ELEMENTAL CALCIUM) 500 MG chewable tablet, Chew 1-2 tablets by mouth as needed for indigestion or heartburn., Disp: , Rfl:    carvedilol (COREG) 6.25 MG tablet, Take 1 tablet (6.25 mg total) by mouth 2 (two) times daily with a meal., Disp: 60 tablet, Rfl: 0   Continuous Blood Gluc Sensor (FREESTYLE LIBRE 14 DAY SENSOR) MISC, APPLY AS DIRECTED AND CHANGE EVERY 14 DAYS., Disp: 2 each, Rfl: 11   Insulin Pen Needle 32G X 4 MM MISC, 1 Device by Does not apply route 2 (two) times daily., Disp: 100 each, Rfl: 11   Lancets MISC, Use one lancet per test to check blood sugar 1-4 times daily. Dx code:E11.9, Disp: 100 each, Rfl: 5   OZEMPIC, 1 MG/DOSE, 4 MG/3ML SOPN, Inject 1 mg into the skin once a week., Disp: ,  Rfl:    pioglitazone (ACTOS) 30 MG tablet, TAKE 1 TABLET ONCE DAILY., Disp: 30 tablet, Rfl: 0   irbesartan (AVAPRO) 300 MG tablet, Take 1  tablet (300 mg total) by mouth daily., Disp: 30 tablet, Rfl: 0  Orders Placed This Encounter  Procedures   CT CARDIAC SCORING ([facility] LOCATIONS ONLY)   Basic metabolic panel   Magnesium   --Continue cardiac medications as reconciled in final medication list. --Return in about 4 weeks (around 01/13/2021) for Follow up, BP. Or sooner if needed. --Continue follow-up with your primary care physician regarding the management of your other chronic comorbid conditions.  Patient's questions and concerns were addressed to her satisfaction. She voices understanding of the instructions provided during this encounter.   This note was created using a voice recognition software as a result there may be grammatical errors inadvertently enclosed that do not reflect the nature of this encounter. Every attempt is made to correct such errors.  Total time spent: 36 minutes.   Rex Kras, [place], [facility]  Pager: [phone] Office: [phone]

## 2020-12-22 ENCOUNTER — Other Ambulatory Visit: Payer: Self-pay | Admitting: Internal Medicine

## 2021-01-01 ENCOUNTER — Other Ambulatory Visit: Payer: Self-pay

## 2021-01-01 DIAGNOSIS — I10 Essential (primary) hypertension: Secondary | ICD-10-CM

## 2021-01-15 ENCOUNTER — Inpatient Hospital Stay: Admission: RE | Admit: 2021-01-15 | Payer: BC Managed Care – PPO | Source: Ambulatory Visit

## 2021-01-20 ENCOUNTER — Ambulatory Visit: Payer: BC Managed Care – PPO | Admitting: Cardiology

## 2021-02-03 ENCOUNTER — Other Ambulatory Visit: Payer: Self-pay | Admitting: Internal Medicine

## 2021-02-04 ENCOUNTER — Telehealth: Payer: Self-pay | Admitting: Internal Medicine

## 2021-02-04 NOTE — Telephone Encounter (Signed)
I called patient she is wanting a Rx sent in for ozempic and libre sensors. Patient has not been seen since Jan. 2021. She does have an upcoming appt for 02/12/21.Informed her that we could not send in any prescriptions until seen by the Dr since its been almost 2 years. Patient states that she has been getting medication from Dr Criss Rosales but was told by that Dr she wasn't sending in anymore refills until sent by endocrinologist.

## 2021-02-04 NOTE — Telephone Encounter (Signed)
PT called for refill on OZEMPIC, 1 MG/DOSE, 4 MG/3ML SOPN and Sensors for [employer]. PT states that she does not have enough to last until her appt 02/12/21.

## 2021-02-05 NOTE — Telephone Encounter (Signed)
Patient notified and will keep appointment for next week.

## 2021-02-05 NOTE — Telephone Encounter (Signed)
Duplicate message already addressed. 

## 2021-02-10 ENCOUNTER — Inpatient Hospital Stay: Admission: RE | Admit: 2021-02-10 | Payer: BC Managed Care – PPO | Source: Ambulatory Visit

## 2021-02-12 ENCOUNTER — Ambulatory Visit (INDEPENDENT_AMBULATORY_CARE_PROVIDER_SITE_OTHER): Payer: BC Managed Care – PPO | Admitting: Internal Medicine

## 2021-02-12 ENCOUNTER — Encounter: Payer: Self-pay | Admitting: Internal Medicine

## 2021-02-12 ENCOUNTER — Other Ambulatory Visit: Payer: Self-pay

## 2021-02-12 VITALS — BP 146/88 | HR 80 | Ht 62.0 in | Wt 284.0 lb

## 2021-02-12 DIAGNOSIS — E1159 Type 2 diabetes mellitus with other circulatory complications: Secondary | ICD-10-CM

## 2021-02-12 DIAGNOSIS — E1142 Type 2 diabetes mellitus with diabetic polyneuropathy: Secondary | ICD-10-CM

## 2021-02-12 DIAGNOSIS — E1165 Type 2 diabetes mellitus with hyperglycemia: Secondary | ICD-10-CM | POA: Diagnosis not present

## 2021-02-12 DIAGNOSIS — E119 Type 2 diabetes mellitus without complications: Secondary | ICD-10-CM | POA: Insufficient documentation

## 2021-02-12 LAB — POCT GLYCOSYLATED HEMOGLOBIN (HGB A1C): Hemoglobin A1C: 10.6 % — AB (ref 4.0–5.6)

## 2021-02-12 MED ORDER — OZEMPIC (1 MG/DOSE) 4 MG/3ML ~~LOC~~ SOPN: 1.0000 mg | PEN_INJECTOR | SUBCUTANEOUS | 3 refills | Status: DC

## 2021-02-12 NOTE — Progress Notes (Signed)
Name: Lindsay Gomez  Age/ Sex: 50 y.o., female   MRN/ DOB: [id number], 02/13/1971     PCP: [name], DO   Reason for Endocrinology Evaluation: Type 2 Diabetes Mellitus  Initial Endocrine Consultative Visit: 01/07/18    PATIENT IDENTIFIER: [name] is a 50 y.o. female with a past medical history of HTN, Obesity,CVA, OSA not on treatment and T2DM . The patient has followed with Endocrinology clinic since 01/07/18 for consultative assistance with management of her diabetes.  DIABETIC HISTORY:  Lindsay Gomez was diagnosed with T2DM in 2016,She is intolerant to Metformin.On her initial visit to our clinic she was on Basaglar, glipizide and Trulicity, she did admit to noncompliance with her meds, she would take basaglar once a month. Her hemoglobin A1c has ranged from  6.3% in 2014, peaking at 14.1% in 2018  Pt with hx of recurrent yeast infections Lives with boyfriend who has T1DM and on dialysis  SUBJECTIVE:   During the last visit (12/16/2018): W continued  Actos 30 mg daily, increased Ozempic  Today (02/12/2021): Lindsay Gomez is here for a follow up on diabetes management.  She has NOT been to our office in 22 months. IN the interim she switched care to Dr. Chalmers Cater. She apparently was on [place] at some point        She  been checking sugars due to cost issues with the freestyle libre.The patient has not had hypoglycemic episodes since the last clinic visit. Otherwise, the patient has not required any recent emergency interventions for hypoglycemia and has not  had recent hospitalizations secondary to hyper or hypoglycemic episodes.     She was seen by cardiology 12/2020 for HTN and dyspnea on exertion  She was also seen by Dr. Chalmers Cater in 2022  She has occasional diarrhea  if she skips Ozempic and restarts it again. She has noted blurry vision . She is past due for an eye exam   HOME DIABETES REGIMEN:  Pioglitazone 30 mg daily- not taking  Ozempic 1 mg  weekly Insulin Mix - not taking     CONTINUOUS GLUCOSE MONITORING RECORD INTERPRETATION    Dates of Recording: 11/17-11/30/2022  Sensor description:freestyle libre  Results statistics:   CGM use % of time 77  Average and SD 148/15.1  Time in range   91     %  % Time Above 180 9  % Time above 250 0  % Time Below target 0    Glycemic patterns summary: Optimal BG's at night and most of the day   Hyperglycemic episodes  post prandial   Hypoglycemic episodes occurred n/a  Overnight periods: optimal    DIABETIC COMPLICATIONS: Microvascular complications:  Neuropathy Denies:  Retinopathy, nephropathy Last eye exam: Completed 2021   Macrovascular complications:  CVA (24/4010) Denies: CAD, PVD       HISTORY:  Past Medical History:  Past Medical History:  Diagnosis Date   Allergy    rhinitis   Anemia    Anxiety    Diabetes mellitus without complication ([place])    Enlarged heart    Hyperlipidemia    Hypertension    Morbid obesity ([place])    Shortness of breath dyspnea    with low iron   Past Surgical History:  Past Surgical History:  Procedure Laterality Date   ABDOMINAL HYSTERECTOMY N/A 01/29/2015   Procedure: TOTAL ABDOMINAL HYSTERECTOMY ;  Surgeon: Ena Dawley, MD;  Location: [facility];  Service: Gynecology;  Laterality: N/A;   BILATERAL SALPINGECTOMY Bilateral  01/29/2015   Procedure: BILATERAL SALPINGECTOMY;  Surgeon: Ena Dawley, MD;  Location: [facility];  Service: Gynecology;  Laterality: Bilateral;   [place]   ? arthroscopic/ right   Social History:  reports that she has never smoked. She has never used smokeless tobacco. She reports that she does not drink alcohol and does not use drugs. Family History:  Family History  Problem Relation Age of Onset   Hypertension Mother    Diabetes Mother    Coronary artery disease Father    Heart failure Father    Heart attack Father    Diabetes Father    Hypertension Other     Hyperlipidemia Other    Cancer Paternal Grandmother        unknown type?   Esophageal cancer Neg Hx    Colon cancer Neg Hx    Pancreatic cancer Neg Hx    Stomach cancer Neg Hx      HOME MEDICATIONS: Allergies as of 02/12/2021       Reactions   Dulaglutide Other (See Comments)   Lisinopril Other (See Comments)   Cough    Metformin Hcl Other (See Comments)        Medication List        Accurate as of February 12, 2021  1:21 PM. If you have any questions, ask your nurse or doctor.          STOP taking these medications    ONE TOUCH ULTRA MINI w/Device Kit Stopped by: Dorita Sciara, MD       TAKE these medications    aspirin 81 MG EC tablet Commonly known as: Aspirin 81 Take 1 tablet (81 mg total) by mouth daily. Swallow whole.   atorvastatin 20 MG tablet Commonly known as: LIPITOR Take 20 mg by mouth at bedtime.   calcium carbonate 500 MG chewable tablet Commonly known as: TUMS - dosed in mg elemental calcium Chew 1-2 tablets by mouth as needed for indigestion or heartburn.   carvedilol 6.25 MG tablet Commonly known as: COREG Take 1 tablet (6.25 mg total) by mouth 2 (two) times daily with a meal.   FreeStyle Libre 14 Day Sensor Misc APPLY AS DIRECTED AND CHANGE EVERY 14 DAYS.   Insulin Pen Needle 32G X 4 MM Misc 1 Device by Does not apply route 2 (two) times daily.   irbesartan 300 MG tablet Commonly known as: AVAPRO Take 1 tablet (300 mg total) by mouth daily.   Lancets Misc Use one lancet per test to check blood sugar 1-4 times daily. Dx code:E11.9   Ozempic (1 MG/DOSE) 4 MG/3ML Sopn Generic drug: Semaglutide (1 MG/DOSE) Inject 1 mg into the skin once a week.   pioglitazone 30 MG tablet Commonly known as: ACTOS TAKE 1 TABLET ONCE DAILY.         OBJECTIVE:   Vital Signs: BP (!) 146/88 (BP Location: Left Arm, Patient Position: Sitting)   Pulse 80   Ht 5' 2"  (1.575 m)   Wt 284 lb (128.8 kg)   LMP 12/18/2014 (Exact Date)    SpO2 99%   BMI 51.94 kg/m   Wt Readings from Last 3 Encounters:  02/12/21 284 lb (128.8 kg)  12/16/20 287 lb 9.6 oz (130.5 kg)  08/30/20 294 lb (133.4 kg)     Exam: General: Pt appears well and is in NAD  Lungs: Clear with good BS bilat with no rales, rhonchi, or wheezes  Heart: RRR with normal  S1 and S2   Abdomen: Normoactive bowel sounds, soft, nontender, without masses or organomegaly palpable  Extremities: Trace pretibial edema.    Neuro: MS is good with appropriate affect, pt is alert and Ox3     ASSESSMENT / PLAN / RECOMMENDATIONS:   1) Type 2 Diabetes Mellitus, poorly controlled, With neuropathic  and macrovascular complications - Most recent A1c of 10.1 %. Goal A1c < 7.0 %.     - Pt has not been to our office in 22 months. In the interim she has been seen by Dr. Chalmers Cater. It also appears she was on Basal insulin at some point but self discontinued  - She is not on  pioglitazone anymore  - She has started consistently taking Ozempic last month and monitoring her diet properly and has noted optimization of her glucose readings.  - In review of her CGM , 91 % of her readings are at goal. I have praised the pt on the new changes and I have encouraged her to continue with lifestyle changes  - She had declined SGLT-2 inhibitors due to risk of genital infections   MEDICATIONS:   Continue Ozempic 1 mg weekly   EDUCATION / INSTRUCTIONS: BG monitoring instructions: Patient is instructed to check her blood sugars 3 times a day, before meals and bedtime. Call [place] [facility] if: BG persistently < 70 I reviewed the Rule of 15 for the treatment of hypoglycemia in detail with the patient. Literature supplied.  2) Diabetic complications:  Eye:She does not have known diabetic retinopathy. Pt urged to schedule an eye exam  Neuro/ Feet: Does have known diabetic peripheral neuropathy. Renal: Patient does not have known baseline CKD. She is on on an ACEI/ARB at  present.  Signed electronically by: Mack Guise, MD  [facility]  [facility] [address] [place], [place] [address] Phone: [phone] FAX: [phone]   CC: [name], [place] [address] [place] [address] Phone: [phone]  Fax: [phone]  Return to Endocrinology clinic as below: Future Appointments  Date Time Provider [place]  02/13/2021  3:30 PM Rex Kras, DO [facility] None  06/12/2021 11:30 AM Emorie Mcfate, Melanie Crazier, MD [facility] None

## 2021-02-12 NOTE — Patient Instructions (Signed)
-   Keep Up the Good Work ! - Continue Ozempic 1 mg weekly

## 2021-02-13 ENCOUNTER — Other Ambulatory Visit (HOSPITAL_COMMUNITY): Payer: Self-pay

## 2021-02-13 ENCOUNTER — Telehealth: Payer: Self-pay | Admitting: Internal Medicine

## 2021-02-13 ENCOUNTER — Other Ambulatory Visit: Payer: Self-pay

## 2021-02-13 ENCOUNTER — Ambulatory Visit: Payer: BC Managed Care – PPO | Admitting: Cardiology

## 2021-02-13 MED ORDER — OZEMPIC (1 MG/DOSE) 4 MG/3ML ~~LOC~~ SOPN
1.0000 mg | PEN_INJECTOR | SUBCUTANEOUS | 3 refills | Status: DC
  Filled 2021-02-13: qty 3, 28d supply, fill #0

## 2021-02-13 NOTE — Telephone Encounter (Signed)
Patient is unable to get Ozempic due to [place] is unavailable at [facility]. Patient states she does not want an alternative medication and requests samples of Ozempic if available.

## 2021-02-13 NOTE — Telephone Encounter (Signed)
Script sent to [facility]

## 2021-03-03 ENCOUNTER — Ambulatory Visit
Admission: RE | Admit: 2021-03-03 | Discharge: 2021-03-03 | Disposition: A | Discharge status: Home / Self Care | Payer: No Typology Code available for payment source | Source: Ambulatory Visit | Attending: Cardiology | Admitting: Cardiology

## 2021-03-03 DIAGNOSIS — Z794 Long term (current) use of insulin: Secondary | ICD-10-CM

## 2021-03-03 DIAGNOSIS — Z8249 Family history of ischemic heart disease and other diseases of the circulatory system: Secondary | ICD-10-CM

## 2021-03-06 ENCOUNTER — Other Ambulatory Visit: Payer: BC Managed Care – PPO

## 2021-03-18 NOTE — Progress Notes (Signed)
Patient does not have the money to come in for the test results. She would like for you to give her a call and explain them to her when you get the chance.

## 2021-03-26 ENCOUNTER — Other Ambulatory Visit (HOSPITAL_COMMUNITY): Payer: Self-pay

## 2021-03-31 NOTE — Progress Notes (Signed)
Attempted to call pt, straight to vm. Voicemail box was full. Will try again to schedule pt for an office visit in April 2023 later.

## 2021-04-02 NOTE — Progress Notes (Signed)
Called and spoke to pt, pt stated that she will give [place] a call back when she is ready to schedule the appointment.

## 2021-05-03 ENCOUNTER — Encounter (HOSPITAL_COMMUNITY): Payer: Self-pay | Admitting: Internal Medicine

## 2021-05-03 ENCOUNTER — Other Ambulatory Visit: Payer: Self-pay

## 2021-05-03 ENCOUNTER — Observation Stay (HOSPITAL_COMMUNITY)
Admission: EM | Admit: 2021-05-03 | Discharge: 2021-05-04 | Disposition: A | Discharge status: Home / Self Care | Payer: BC Managed Care – PPO | Attending: Internal Medicine | Admitting: Internal Medicine

## 2021-05-03 ENCOUNTER — Emergency Department (HOSPITAL_COMMUNITY): Payer: BC Managed Care – PPO

## 2021-05-03 DIAGNOSIS — Z91128 Patient's intentional underdosing of medication regimen for other reason: Secondary | ICD-10-CM

## 2021-05-03 DIAGNOSIS — Z794 Long term (current) use of insulin: Secondary | ICD-10-CM | POA: Diagnosis not present

## 2021-05-03 DIAGNOSIS — Z90710 Acquired absence of both cervix and uterus: Secondary | ICD-10-CM

## 2021-05-03 DIAGNOSIS — E1169 Type 2 diabetes mellitus with other specified complication: Secondary | ICD-10-CM | POA: Diagnosis present

## 2021-05-03 DIAGNOSIS — I480 Paroxysmal atrial fibrillation: Secondary | ICD-10-CM | POA: Diagnosis present

## 2021-05-03 DIAGNOSIS — Z7982 Long term (current) use of aspirin: Secondary | ICD-10-CM | POA: Insufficient documentation

## 2021-05-03 DIAGNOSIS — I4891 Unspecified atrial fibrillation: Principal | ICD-10-CM | POA: Diagnosis present

## 2021-05-03 DIAGNOSIS — Z9114 Patient's other noncompliance with medication regimen: Secondary | ICD-10-CM

## 2021-05-03 DIAGNOSIS — Z6841 Body Mass Index (BMI) 40.0 and over, adult: Secondary | ICD-10-CM | POA: Diagnosis not present

## 2021-05-03 DIAGNOSIS — Z833 Family history of diabetes mellitus: Secondary | ICD-10-CM

## 2021-05-03 DIAGNOSIS — E785 Hyperlipidemia, unspecified: Secondary | ICD-10-CM | POA: Diagnosis not present

## 2021-05-03 DIAGNOSIS — Z79899 Other long term (current) drug therapy: Secondary | ICD-10-CM | POA: Insufficient documentation

## 2021-05-03 DIAGNOSIS — I10 Essential (primary) hypertension: Secondary | ICD-10-CM | POA: Diagnosis not present

## 2021-05-03 DIAGNOSIS — T465X6A Underdosing of other antihypertensive drugs, initial encounter: Secondary | ICD-10-CM | POA: Diagnosis present

## 2021-05-03 DIAGNOSIS — T447X6A Underdosing of beta-adrenoreceptor antagonists, initial encounter: Secondary | ICD-10-CM | POA: Diagnosis present

## 2021-05-03 DIAGNOSIS — G4733 Obstructive sleep apnea (adult) (pediatric): Secondary | ICD-10-CM | POA: Diagnosis present

## 2021-05-03 DIAGNOSIS — E1165 Type 2 diabetes mellitus with hyperglycemia: Secondary | ICD-10-CM | POA: Diagnosis present

## 2021-05-03 DIAGNOSIS — Z7901 Long term (current) use of anticoagulants: Secondary | ICD-10-CM | POA: Diagnosis not present

## 2021-05-03 DIAGNOSIS — Z8249 Family history of ischemic heart disease and other diseases of the circulatory system: Secondary | ICD-10-CM

## 2021-05-03 DIAGNOSIS — Z20822 Contact with and (suspected) exposure to covid-19: Secondary | ICD-10-CM | POA: Diagnosis not present

## 2021-05-03 DIAGNOSIS — I2510 Atherosclerotic heart disease of native coronary artery without angina pectoris: Secondary | ICD-10-CM | POA: Diagnosis present

## 2021-05-03 DIAGNOSIS — R739 Hyperglycemia, unspecified: Secondary | ICD-10-CM

## 2021-05-03 DIAGNOSIS — Z8673 Personal history of transient ischemic attack (TIA), and cerebral infarction without residual deficits: Secondary | ICD-10-CM

## 2021-05-03 DIAGNOSIS — Z888 Allergy status to other drugs, medicaments and biological substances status: Secondary | ICD-10-CM

## 2021-05-03 DIAGNOSIS — Z823 Family history of stroke: Secondary | ICD-10-CM

## 2021-05-03 DIAGNOSIS — I6523 Occlusion and stenosis of bilateral carotid arteries: Secondary | ICD-10-CM | POA: Diagnosis present

## 2021-05-03 HISTORY — DX: Unspecified atrial fibrillation: I48.91

## 2021-05-03 LAB — BASIC METABOLIC PANEL
Anion gap: 11 (ref 5–15)
BUN: 10 mg/dL (ref 6–20)
CO2: 24 mmol/L (ref 22–32)
Calcium: 8.9 mg/dL (ref 8.9–10.3)
Chloride: 104 mmol/L (ref 98–111)
Creatinine, Ser: 0.74 mg/dL (ref 0.44–1.00)
GFR, Estimated: >60 mL/min (ref >60)
Glucose, Bld: 237 mg/dL — ABNORMAL HIGH (ref 70–99)
Potassium: 3.6 mmol/L (ref 3.5–5.1)
Sodium: 139 mmol/L (ref 135–145)

## 2021-05-03 LAB — CBG MONITORING, ED
Glucose-Capillary: 221 mg/dL — ABNORMAL HIGH (ref 70–99)
Glucose-Capillary: 201 mg/dL — ABNORMAL HIGH (ref 70–99)

## 2021-05-03 LAB — LIPID PANEL
Cholesterol: 173 mg/dL (ref 0–200)
HDL: 41 mg/dL (ref >40)
LDL Cholesterol: 113 mg/dL — ABNORMAL HIGH (ref 0–99)
Total CHOL/HDL Ratio: 4.2 RATIO
Triglycerides: 95 mg/dL (ref <150)
VLDL: 19 mg/dL (ref 0–40)

## 2021-05-03 LAB — HEPARIN LEVEL (UNFRACTIONATED)
Heparin Unfractionated: 0.48 IU/mL (ref 0.30–0.70)
Heparin Unfractionated: 0.45 IU/mL (ref 0.30–0.70)

## 2021-05-03 LAB — I-STAT BETA HCG BLOOD, ED (MC, WL, AP ONLY): I-stat hCG, quantitative: <5 m[IU]/mL (ref <5)

## 2021-05-03 LAB — RAPID URINE DRUG SCREEN, HOSP PERFORMED
Amphetamines: NOT DETECTED
Barbiturates: NOT DETECTED
Benzodiazepines: NOT DETECTED
Cocaine: NOT DETECTED
Opiates: NOT DETECTED
Tetrahydrocannabinol: NOT DETECTED

## 2021-05-03 LAB — CBC
HCT: 46.5 % — ABNORMAL HIGH (ref 36.0–46.0)
Hemoglobin: 14.6 g/dL (ref 12.0–15.0)
MCH: 26.8 pg (ref 26.0–34.0)
MCHC: 31.4 g/dL (ref 30.0–36.0)
MCV: 85.3 fL (ref 80.0–100.0)
Platelets: 279 10*3/uL (ref 150–400)
RBC: 5.45 MIL/uL — ABNORMAL HIGH (ref 3.87–5.11)
RDW: 14.5 % (ref 11.5–15.5)
WBC: 6.1 10*3/uL (ref 4.0–10.5)
nRBC: 0 % (ref 0.0–0.2)

## 2021-05-03 LAB — RESP PANEL BY RT-PCR (FLU A&B, COVID) ARPGX2
Influenza A by PCR: NEGATIVE
Influenza B by PCR: NEGATIVE
SARS Coronavirus 2 by RT PCR: NEGATIVE

## 2021-05-03 LAB — GLUCOSE, CAPILLARY
Glucose-Capillary: 128 mg/dL — ABNORMAL HIGH (ref 70–99)
Glucose-Capillary: 182 mg/dL — ABNORMAL HIGH (ref 70–99)
Glucose-Capillary: 199 mg/dL — ABNORMAL HIGH (ref 70–99)

## 2021-05-03 LAB — HEMOGLOBIN A1C
Hgb A1c MFr Bld: 7.3 % — ABNORMAL HIGH (ref 4.8–5.6)
Mean Plasma Glucose: 162.81 mg/dL

## 2021-05-03 LAB — MAGNESIUM: Magnesium: 1.8 mg/dL (ref 1.7–2.4)

## 2021-05-03 LAB — TSH: TSH: 1.092 u[IU]/mL (ref 0.350–4.500)

## 2021-05-03 LAB — BRAIN NATRIURETIC PEPTIDE: B Natriuretic Peptide: 123.8 pg/mL — ABNORMAL HIGH (ref 0.0–100.0)

## 2021-05-03 MED ORDER — INSULIN ASPART 100 UNIT/ML IJ SOLN: 0.0000 [IU] | Freq: Every day | INTRAMUSCULAR | Status: DC

## 2021-05-03 MED ORDER — ONDANSETRON HCL 4 MG PO TABS: 4.0000 mg | ORAL_TABLET | Freq: Four times a day (QID) | ORAL | Status: DC | PRN

## 2021-05-03 MED ORDER — INSULIN ASPART 100 UNIT/ML IJ SOLN
0.0000 [IU] | Freq: Three times a day (TID) | INTRAMUSCULAR | Status: DC
  Administered 2021-05-03: 3 [IU] via SUBCUTANEOUS
  Administered 2021-05-03 – 2021-05-04 (×3): 5 [IU] via SUBCUTANEOUS

## 2021-05-03 MED ORDER — DILTIAZEM HCL 60 MG PO TABS
60.0000 mg | ORAL_TABLET | Freq: Three times a day (TID) | ORAL | Status: DC
  Administered 2021-05-03 – 2021-05-04 (×4): 60 mg via ORAL
  Filled 2021-05-03 (×4): qty 1

## 2021-05-03 MED ORDER — DILTIAZEM HCL-DEXTROSE 125-5 MG/125ML-% IV SOLN (PREMIX)
5.0000 mg/h | INTRAVENOUS | Status: DC
  Administered 2021-05-03 (×2): 5 mg/h via INTRAVENOUS
  Filled 2021-05-03 (×2): qty 125

## 2021-05-03 MED ORDER — SODIUM CHLORIDE 0.9% FLUSH
3.0000 mL | Freq: Two times a day (BID) | INTRAVENOUS | Status: DC
  Administered 2021-05-03 – 2021-05-04 (×2): 3 mL via INTRAVENOUS

## 2021-05-03 MED ORDER — ACETAMINOPHEN 325 MG PO TABS: 650.0000 mg | ORAL_TABLET | Freq: Four times a day (QID) | ORAL | Status: DC | PRN

## 2021-05-03 MED ORDER — HEPARIN BOLUS VIA INFUSION
4000.0000 [IU] | Freq: Once | INTRAVENOUS | Status: AC
  Administered 2021-05-03: 4000 [IU] via INTRAVENOUS
  Filled 2021-05-03: qty 4000

## 2021-05-03 MED ORDER — APIXABAN 5 MG PO TABS
5.0000 mg | ORAL_TABLET | Freq: Two times a day (BID) | ORAL | Status: DC
  Administered 2021-05-03 – 2021-05-04 (×2): 5 mg via ORAL
  Filled 2021-05-03 (×2): qty 1

## 2021-05-03 MED ORDER — ONDANSETRON HCL 4 MG/2ML IJ SOLN: 4.0000 mg | Freq: Four times a day (QID) | INTRAMUSCULAR | Status: DC | PRN

## 2021-05-03 MED ORDER — ATORVASTATIN CALCIUM 10 MG PO TABS
10.0000 mg | ORAL_TABLET | Freq: Every day | ORAL | Status: DC
  Administered 2021-05-03: 10 mg via ORAL
  Filled 2021-05-03: qty 1

## 2021-05-03 MED ORDER — IRBESARTAN 150 MG PO TABS
300.0000 mg | ORAL_TABLET | Freq: Every day | ORAL | Status: DC
  Administered 2021-05-03 – 2021-05-04 (×2): 300 mg via ORAL
  Filled 2021-05-03 (×2): qty 2

## 2021-05-03 MED ORDER — DILTIAZEM LOAD VIA INFUSION
10.0000 mg | Freq: Once | INTRAVENOUS | Status: AC
  Administered 2021-05-03: 10 mg via INTRAVENOUS
  Filled 2021-05-03: qty 10

## 2021-05-03 MED ORDER — ROSUVASTATIN CALCIUM 20 MG PO TABS
20.0000 mg | ORAL_TABLET | Freq: Every day | ORAL | Status: DC
  Administered 2021-05-03: 20 mg via ORAL
  Filled 2021-05-03: qty 1

## 2021-05-03 MED ORDER — ACETAMINOPHEN 650 MG RE SUPP: 650.0000 mg | Freq: Four times a day (QID) | RECTAL | Status: DC | PRN

## 2021-05-03 MED ORDER — HYDRALAZINE HCL 20 MG/ML IJ SOLN: 5.0000 mg | INTRAMUSCULAR | Status: DC | PRN

## 2021-05-03 MED ORDER — HEPARIN (PORCINE) 25000 UT/250ML-% IV SOLN
1400.0000 [IU]/h | INTRAVENOUS | Status: DC
Start: 2021-05-03 — End: 2021-05-03
  Administered 2021-05-03: 1400 [IU]/h via INTRAVENOUS
  Filled 2021-05-03: qty 250

## 2021-05-03 NOTE — Plan of Care (Signed)
°  Problem: Cardiac: Goal: Ability to achieve and maintain adequate cardiopulmonary perfusion will improve Outcome: Progressing   Problem: Education: Goal: Knowledge of General Education information will improve Description: Including pain rating scale, medication(s)/side effects and non-pharmacologic comfort measures Outcome: Progressing   Problem: Clinical Measurements: Goal: Ability to maintain clinical measurements within normal limits will improve Outcome: Progressing   Problem: Clinical Measurements: Goal: Cardiovascular complication will be avoided Outcome: Progressing   Problem: Safety: Goal: Ability to remain free from injury will improve Outcome: Progressing

## 2021-05-03 NOTE — ED Triage Notes (Signed)
Pt arrives to ED BIB [facility] due to new on set Afib RVR. Per EMS pt was laying in bed and began having CP and SHOB. Upon EMS arrival pts HR was in the 190's. Pt states she took 324mg  Aspirin prior to their arrival. No Hx of Afib. Pt A/O x4.

## 2021-05-03 NOTE — Consult Note (Signed)
CARDIOLOGY CONSULT NOTE  Patient ID: Lindsay Gomez MRN: [id number] DOB/AGE: 49-29-1972 49 y.o.  Admit date: 05/03/2021 Attending physician: Shela Leff, MD Primary Physician:  Kathyrn Lass Outpatient Cardiologist: Rex Kras, DO, [facility] Inpatient Cardiologist: Rex Kras, DO, [facility]  Reason of consultation: Atrial fibrillation Referring physician: Dr. Ripley Fraise  Chief complaint: Palpitations  HPI:  Lindsay Gomez is a 52 y.o. African-American female who presents with a chief complaint of "palpitations ." Her past medical history and cardiovascular risk factors include: Benign essential hypertension, insulin-dependent diabetes mellitus type 2, hyperlipidemia, history of stroke, obesity due to excess calories.  Last seen in the office in October 2022 and now presents to the hospital with a chief complaint of palpitations and noted to have atrial fibrillation with rapid ventricular rate on presentation to the ED.  Cardiology is consulted for new onset of atrial fibrillation management.  On 05/02/2020 approximately 11:30 PM she started noticing fluttering in her chest, lasted for few seconds but continued to feel " weird."  She called EMS and was informed that her ventricular rate was too fast and was advised to go to the ED.  Patient was brought to emergency room department for further evaluation and management and per EKG she was in A-fib with RVR.  I was called yesterday night for management and recommended Cardizem drip with IV heparin if no contraindications.  She converted to normal sinus rhythm earlier today and feels good at baseline and wishes to go home.  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  In addition, since December 2022 she stopped taking all of her medications except Ozempic as she continued to feel tired, fatigued, sluggish.  Patient states that she was sleep all  night still wake up fatigued and not well rested.  This is a second hospitalization that has been precipitated by medication noncompliance.  Patient states that " I know it is my fault in my doing."  She had intentions to restart her medications but given her A-fib with RVR presents to the hospital sooner.  No prior history of intracranial bleeding or gastrointestinal bleeding.  Intermittently experiences hemorrhoidal bleeding at times.   ALLERGIES: Allergies  Allergen Reactions   Dulaglutide Other (See Comments)   Lisinopril Other (See Comments)    Cough    Metformin Hcl Other (See Comments)    PAST MEDICAL HISTORY: Past Medical History:  Diagnosis Date   Allergy    rhinitis   Anemia    Anxiety    Atrial fibrillation ([place])    Diabetes mellitus without complication ([place])    glucose usually 140-150   Enlarged heart    Hyperlipidemia    Hypertension    Morbid obesity ([place])    Shortness of breath dyspnea    with low iron    PAST SURGICAL HISTORY: Past Surgical History:  Procedure Laterality Date   ABDOMINAL HYSTERECTOMY N/A 01/29/2015   Procedure: TOTAL ABDOMINAL HYSTERECTOMY ;  Surgeon: Ena Dawley, MD;  Location: [facility];  Service: Gynecology;  Laterality: N/A;   BILATERAL SALPINGECTOMY Bilateral 01/29/2015   Procedure: BILATERAL SALPINGECTOMY;  Surgeon: Ena Dawley, MD;  Location: [facility];  Service: Gynecology;  Laterality: Bilateral;   [place]   ? arthroscopic/ right    FAMILY HISTORY: The patient's family history includes Cancer in her paternal grandmother; Coronary artery disease (age of onset: 66) in her father;  Diabetes in her father and mother; Heart attack in her father; Heart failure in her father; Hyperlipidemia in an other family member; Hypertension in her mother and another family member; Stroke (age of onset: 50) in her brother.   SOCIAL HISTORY:  The patient  reports that she has never smoked. She has never used  smokeless tobacco. She reports that she does not drink alcohol and does not use drugs.  MEDICATIONS: Current Outpatient Medications  Medication Instructions   aspirin (ASPIRIN 81) 81 mg, Oral, Daily, Swallow whole.    bismuth subsalicylate (PEPTO BISMOL) 524 mg, Oral, As needed   carvedilol (COREG) 6.25 mg, Oral, 2 times daily with meals   Continuous Blood Gluc Sensor (FREESTYLE LIBRE 14 DAY SENSOR) MISC APPLY AS DIRECTED AND CHANGE EVERY 14 DAYS.   Insulin Pen Needle 32G X 4 MM MISC 1 Device, Does not apply, 2 times daily   irbesartan (AVAPRO) 300 mg, Oral, Daily   Lancets MISC Use one lancet per test to check blood sugar 1-4 times daily. Dx code:E11.9   Ozempic (1 MG/DOSE) 1 mg, Subcutaneous, Weekly   pioglitazone (ACTOS) 30 MG tablet TAKE 1 TABLET ONCE DAILY.    REVIEW OF SYSTEMS: Review of Systems  Constitutional: Negative for chills and fever.  HENT:  Negative for hoarse voice and nosebleeds.   Eyes:  Negative for discharge, double vision and pain.  Cardiovascular:  Positive for palpitations. Negative for chest pain, claudication, dyspnea on exertion, leg swelling, near-syncope, orthopnea, paroxysmal nocturnal dyspnea and syncope.  Respiratory:  Negative for hemoptysis and shortness of breath.   Musculoskeletal:  Negative for muscle cramps and myalgias.  Gastrointestinal:  Negative for abdominal pain, constipation, diarrhea, hematemesis, hematochezia, melena, nausea and vomiting.  Neurological:  Positive for headaches (left frontal side (on presentation, now resoved)). Negative for dizziness and light-headedness.  All other systems reviewed and are negative.  PHYSICAL EXAM: Vitals with BMI 05/03/2021 05/03/2021 05/03/2021  Height - 5\' 2"  -  Weight - 281 lbs 3 oz -  BMI - 02.72 -  Systolic 536 644 034  Diastolic 85 99 94  Pulse 85 91 90     Intake/Output Summary (Last 24 hours) at 05/03/2021 1910 Last data filed at 05/03/2021 1857 Gross per 24 hour  Intake 345.32 ml  Output  --  Net 345.32 ml    Net IO Since Admission: 345.32 mL [05/03/21 1910]  CONSTITUTIONAL: Appears older than stated age, hemodynamically stable, no acute distress. SKIN: Skin is warm and dry. No rash noted. No cyanosis. No pallor. No jaundice HEAD: Normocephalic and atraumatic.  EYES: No scleral icterus MOUTH/THROAT: Moist oral membranes.  NECK: No JVD present. No thyromegaly noted. No carotid bruits  LYMPHATIC: No visible cervical adenopathy.  CHEST Normal respiratory effort. No intercostal retractions  LUNGS: Clear to auscultation bilaterally.  No stridor. No wheezes. No rales.  CARDIOVASCULAR: Regular rate and rhythm, positive S1-S2, no murmurs rubs or gallops appreciated ABDOMINAL: Obese, soft, nontender, nondistended, positive bowel sounds in all 4 quadrants, no apparent ascites.  EXTREMITIES: No pitting edema, warm to touch.  HEMATOLOGIC: No significant bruising NEUROLOGIC: Oriented to person, place, and time. Nonfocal. Normal muscle tone.  PSYCHIATRIC: Normal mood and affect. Normal behavior. Cooperative  RADIOLOGY: DG Chest Port 1 View  Result Date: 05/03/2021 CLINICAL DATA:  AFib. EXAM: PORTABLE CHEST 1 VIEW COMPARISON:  08/13/2020. FINDINGS: The heart is enlarged and the mediastinal contour is otherwise within normal limits. The pulmonary vasculature is mildly distended. No consolidation, effusion, or pneumothorax. No acute osseous abnormality.  IMPRESSION: Cardiomegaly with pulmonary vascular congestion. Electronically Signed   By: Brett Fairy M.D.   On: 05/03/2021 02:40    LABORATORY DATA: Lab Results  Component Value Date   WBC 6.1 05/03/2021   HGB 14.6 05/03/2021   HCT 46.5 (H) 05/03/2021   MCV 85.3 05/03/2021   PLT 279 05/03/2021    Recent Labs  Lab 05/03/21 0200  NA 139  K 3.6  CL 104  CO2 24  BUN 10  CREATININE 0.74  CALCIUM 8.9  GLUCOSE 237*    Lipid Panel  Lab Results  Component Value Date   CHOL 153 08/13/2020   HDL 48 08/13/2020   LDLCALC 91  08/13/2020   LDLDIRECT 99.4 (H) 08/13/2020   TRIG 71 08/13/2020   CHOLHDL 3.2 08/13/2020    BNP (last 3 results) Recent Labs    08/11/20 1851  BNP 90.0    HEMOGLOBIN A1C Lab Results  Component Value Date   HGBA1C 10.6 (A) 02/12/2021   MPG 243 08/11/2020    Cardiac Panel (last 3 results) No results for input(s): CKTOTAL, CKMB, TROPONINIHS, RELINDX in the last 72 hours.   TSH Recent Labs    05/03/21 1121  TSH 1.092     CARDIAC DATABASE: EKG: 05/03/2021: A-fib with RVR, 155 bpm, normal axis, nonspecific ST-T changes likely due to LVH, rate related, however ischemia cannot be ruled out.  05/03/2021: Atrial fibrillation with rapid ventricular rate, 165 bpm, NSVT, without underlying injury pattern.  05/03/2021: Atrial fibrillation with rapid ventricular rate, 123 bpm, nonspecific ST-T changes.  218/2023: Normal sinus rhythm, 80 bpm, normal axis, without underlying ischemia or injury pattern.  Echocardiogram: 08/12/2020: LVEF 50-55%, no regional wall motion abnormalities, moderate LVH, normal right ventricular size and function, mild MR.   Stress Testing: None   Heart Catheterization: None   Carotid duplex: 02/2017:  Right Carotid: There is evidence in the right ICA of a 1-39% stenosis.  Left Carotid: There is evidence in the left ICA of a 1-39% stenosis.  Vertebrals: Both vertebral arteries were patent with antegrade flow.    [place] LE Venous Duplex 09/08/2019:   RIGHT: No evidence of common femoral vein obstruction.  LEFT:  There is no evidence of deep vein thrombosis in the lower extremity.   CT Cardiac Scoring: 03/03/2021 Total CAC 85.8 AU, 98th percentile for patient's age, sex, and race.  IMPRESSION & RECOMMENDATIONS: Lindsay Gomez is a 51 y.o. African-American female whose past medical history and cardiovascular risk factors include: Benign essential hypertension, insulin-dependent diabetes mellitus type 2, hyperlipidemia, history of stroke, obesity due to  excess calories.  Impression:  Newly discovered atrial fibrillation with rapid ventricular rate. Paroxysmal atrial fibrillation. Long-term oral anticoagulation Benign essential hypertension. Insulin-dependent diabetes mellitus type 2 with hyperglycemia. Diabetes with hyperlipidemia Mild coronary artery calcification Bilateral carotid artery atherosclerosis History of thalamic stroke. Family history of premature CAD. Sleep apnea Noncompliance with medical therapy Obesity due to excess calories  Plan:  Newly discovered atrial fibrillation with rapid ventricular rate: Acute, newly discovered / diagnosed.  Present on admission converted to NSR with IV Cardizem drip Cardizem drip discontinued now transitioning to oral Cardizem 60 mg p.o. every 8 hours with holding parameters. Plan to convert her to extended release Cardizem tomorrow I had a long and detailed discussion with the patient regarding the incidence, etiology, pathophysiology, prognosis, and therapeutic options for atrial fibrillation.  Specifically we discussed oral anticoagulation for stroke prevention.  CHA2DS2-VASc SCORE is 5 which correlates to 6.7% risk of stroke per year (  HTN, diabetes, stroke, gender). I reviewed with patient the benefits and risks of initiating/continuing anticoagulation rx, and based on afib treatment guidelines, I recommended long-term anticoagulation for stroke prevention.  Patient agreed. Discussed medication profile for Coumadin, Xarelto, Eliquis.  Patient states that it is affordable given her drug plan coverage she would like to prefer to initiate Eliquis. Spoke to inpatient pharmacy to look into the coverage as for her oral anticoagulation.  Recommendations pending TSH within normal limits. UDS negative Echocardiogram pending EKG independently reviewed findings noted above. Telemetry independently reviewed. Prior diagnosis of sleep apnea, currently not utilizing device therapy.  Patient is  encouraged to follow-up with her sleep provider to have this reevaluated/addressed if needed Educated importance of healthy weight loss with consuming heart healthy meals and increasing physical activity as tolerated with a goal of 30 minutes a day 5 days a week. Given the new onset/discovered atrial fibrillation recommend ischemic work-up.  Shared decision is outpatient work-up.  Long-term oral anticoagulation: Indication paroxysmal atrial fibrillation. Hb 14.6g/dL (prior to [facility]).  No prior history of GI bleeding or intracranial bleeding.  Intermittently has hemorrhoidal bleeding.  No recent surgeries. Patient has been educated on the importance of monitoring for evidence of bleeding which includes but not limited to hemoptysis, hematochezia, melanotic stools, and hematuria. Patient educated on fall precautions and if she was to be injured despite the mechanism of injury she is to seek medical attention by going to the closest ER since she is on blood thinners.  Patient understands importance of this because if internal bleeding is not treated in a timely manner it may further lead to morbidity and/or mortality.  Patient voices understanding of these recommendations and provides verbal feedback.  Benign essential hypertension: Restart home medications.  Insulin-dependent diabetes mellitus type 2 with hyperglycemia: Check hemoglobin A1c..  Management per primary team.  Diabetes with hyperlipidemia: LDL currently not at goal.  Check direct LDL.  Since she will be on diltiazem will transition her from atorvastatin to Crestor.   Mild coronary artery calcification: Discontinue aspirin as she will be on oral anticoagulation given her paroxysmal A-fib and high CHA2DS2-VASc score. Continue statin therapy.  Recommend stress test as outpatient.  Noncompliance with medical therapy: Stop taking all of her medications except Ozempic as of December 2022.  This is her second hospitalization where she stopped  taking medications prior to ED visit.  Discussed the importance of medication compliance.  Sleep apnea: Currently not on CPAP.  Patient is encouraged to follow-up with sleep provider to be considered for device therapy if clinically warranted.  Independently reviewed/interpreted: Labs and since admission, several EKGs as discussed above, last echocardiogram and coronary calcium report, last office note, documentation as per attending and ER providers, discussing plan of care with nursing staff/pharmacy/primary team.   Total encounter time 86 minutes. *Total Encounter Time as defined by the Centers for Medicare and Medicaid Services includes, in addition to the face-to-face time of a patient visit (documented in the note above) non-face-to-face time: obtaining and reviewing outside history, ordering and reviewing medications, tests or procedures, care coordination (communications with other health care professionals or caregivers) and documentation in the medical record.  Patient's questions and concerns were addressed to her satisfaction. She voices understanding of the instructions provided during this encounter.   This note was created using a voice recognition software as a result there may be grammatical errors inadvertently enclosed that do not reflect the nature of this encounter. Every attempt is made to correct such errors.  Mechele Claude [facility]  Pager: [phone] Office: [phone] 05/03/2021, 7:10 PM

## 2021-05-03 NOTE — Progress Notes (Signed)
[place] for heparin Indication: atrial fibrillation  Allergies  Allergen Reactions   Dulaglutide Other (See Comments)   Lisinopril Other (See Comments)    Cough    Metformin Hcl Other (See Comments)    Patient Measurements: Height: 5\' 2"  (157.5 cm) Weight: 128.8 kg (284 lb) (from Nov 2022 records) IBW/kg (Calculated) : 50.1 Heparin Dosing Weight: 85kg  Vital Signs: Temp: 98.2 F (36.8 C) (02/18 0150) Temp Source: Oral (02/18 0150) BP: 139/82 (02/18 0900) Pulse Rate: 92 (02/18 0900)   Medical History: Past Medical History:  Diagnosis Date   Allergy    rhinitis   Anemia    Anxiety    Atrial fibrillation ([facility])    Diabetes mellitus without complication ([facility])    glucose usually 140-150   Enlarged heart    Hyperlipidemia    Hypertension    Morbid obesity ([facility])    Shortness of breath dyspnea    with low iron    Assessment: 51yo female c/o CP and SOB, EMS found pt to be in Afib w/ RVR w/ rate to 190s, no known h/o Afib, to begin heparin.  Of note pt has multiple BP and DM meds prescribed but reports that she does not take most of them.  Initial HL this AM is therapeutic on 1400 units/hr of IV heparin. Hgb and Plt wnl. No overt s/s of bleeding.   Goal of Therapy:  Heparin level 0.3-0.7 units/ml Monitor platelets by anticoagulation protocol: Yes   Plan:  -Continue heparin infusion at 1400 units/hr -F/u 6 hr cHL -Monitor daily HL, CBC and watch for s/s of bleeding   Albertina Parr, PharmD., BCCCP Clinical Pharmacist Please refer to [facility] for unit-specific pharmacist

## 2021-05-03 NOTE — Assessment & Plan Note (Addendum)
Patient reports she is no longer taking aspirin.  Started on Eliquis as above.

## 2021-05-03 NOTE — H&P (Signed)
History and Physical    Patient: Lindsay Gomez:[id number] DOB: [birth year] DOA: 05/03/2021 DOS: the patient was seen and examined on 05/03/2021 PCP: Pcp, No  - dismissed because "I was challenging something they were trying to do" Patient coming from: Home - lives alone; NOK: Everli Rother, [phone] or Maye Hides, [phone]   Chief Complaint: CP/SOB  HPI: Lindsay Gomez is a 51 y.o. female with medical history significant of DM; HLD; HTN; and morbid obesity presenting with CP/SOB.  She had a rough day and went upstairs to go to bed.  She laid down and felt a flutter in her chest.  She started feeling very weird and it escalated from there.  She felt a lump in her throat and she felt worse.  Earlier in the day she felt a little different but she pushed it to the side.  She had a dull pain that was sitting there, no pressure.  She did not feel SOB until she arrived in the ER.      ER Course:  Carryover, per Dr. Marlowe Sax:  Patient with diabetes, hypertension, hyperlipidemia presented with chest pain and shortness of breath.  Found to be in new onset A-fib with RVR.  ED physician spoke with patient's cardiologist Dr. Terri Skains who recommended anticoagulation and rate control.  Not a good candidate for cardioversion.  She was started on heparin and Cardizem drip.  Rate now improved.     Review of Systems: As mentioned in the history of present illness. All other systems reviewed and are negative. Past Medical History:  Diagnosis Date   Allergy    rhinitis   Anemia    Anxiety    Atrial fibrillation ([place])    Diabetes mellitus without complication ([place])    glucose usually 140-150   Enlarged heart    Hyperlipidemia    Hypertension    Morbid obesity ([place])    Shortness of breath dyspnea    with low iron   Past Surgical History:  Procedure Laterality Date   ABDOMINAL HYSTERECTOMY N/A 01/29/2015   Procedure: TOTAL ABDOMINAL HYSTERECTOMY ;  Surgeon: Ena Dawley, MD;   Location: [facility];  Service: Gynecology;  Laterality: N/A;   BILATERAL SALPINGECTOMY Bilateral 01/29/2015   Procedure: BILATERAL SALPINGECTOMY;  Surgeon: Ena Dawley, MD;  Location: [facility];  Service: Gynecology;  Laterality: Bilateral;   [place]   ? arthroscopic/ right   Social History:  reports that she has never smoked. She has never used smokeless tobacco. She reports that she does not drink alcohol and does not use drugs.  Allergies  Allergen Reactions   Dulaglutide Other (See Comments)   Lisinopril Other (See Comments)    Cough    Metformin Hcl Other (See Comments)    Family History  Problem Relation Age of Onset   Hypertension Mother    Diabetes Mother    Coronary artery disease Father 51   Heart failure Father    Heart attack Father    Diabetes Father    Stroke Brother 83   Cancer Paternal Grandmother        unknown type?   Hypertension Other    Hyperlipidemia Other    Esophageal cancer Neg Hx    Colon cancer Neg Hx    Pancreatic cancer Neg Hx    Stomach cancer Neg Hx    Atrial fibrillation Neg Hx     Prior to Admission medications   Medication Sig Start Date End Date  Taking? Authorizing Provider  bismuth subsalicylate (PEPTO BISMOL) 262 MG chewable tablet Chew 524 mg by mouth as needed for indigestion or diarrhea or loose stools.   Yes [provider]  Continuous Blood Gluc Sensor (FREESTYLE LIBRE 14 DAY SENSOR) MISC APPLY AS DIRECTED AND CHANGE EVERY 14 DAYS. 06/19/19  Yes Shamleffer, Melanie Crazier, MD  OZEMPIC, 1 MG/DOSE, 4 MG/3ML SOPN Inject 1 mg into the skin once a week. Patient taking differently: Inject 1 mg into the skin every Sunday. 02/13/21  Yes Shamleffer, Melanie Crazier, MD  aspirin (ASPIRIN 81) 81 MG EC tablet Take 1 tablet (81 mg total) by mouth daily. Swallow whole. Patient not taking: Reported on 05/03/2021 03/24/18   Lance Sell, NP  carvedilol (COREG) 6.25 MG tablet Take 1 tablet (6.25 mg total)  by mouth 2 (two) times daily with a meal. Patient not taking: Reported on 05/03/2021 09/25/20   Tolia, Sunit, DO  Insulin Pen Needle 32G X 4 MM MISC 1 Device by Does not apply route 2 (two) times daily. Patient not taking: Reported on 05/03/2021 03/23/19   Shamleffer, Melanie Crazier, MD  irbesartan (AVAPRO) 300 MG tablet Take 1 tablet (300 mg total) by mouth daily. Patient not taking: Reported on 05/03/2021 12/16/20 01/15/21  Rex Kras, DO  Lancets MISC Use one lancet per test to check blood sugar 1-4 times daily. Dx code:E11.9 Patient not taking: Reported on 05/03/2021 12/27/15   Golden Circle, FNP  pioglitazone (ACTOS) 30 MG tablet TAKE 1 TABLET ONCE DAILY. Patient not taking: Reported on 05/03/2021 10/16/19   Shamleffer, Melanie Crazier, MD    Physical Exam: Vitals:   05/03/21 1045 05/03/21 1240 05/03/21 1240 05/03/21 1324  BP: (!) 154/83 (!) 145/94  (!) 160/99  Pulse: 90 90  91  Resp: 18 (!) 23  (!) 21  Temp:   98.6 F (37 C) 98.7 F (37.1 C)  TempSrc:   Oral Oral  SpO2: 99% 96%  98%  Weight:    127.6 kg  Height:    5\' 2"  (1.575 m)   General:  Appears calm and comfortable and is in NAD Eyes:  PERRL, EOMI, normal lids, iris ENT:  grossly normal hearing, lips & tongue, mmm; appropriate dentition Neck:  no LAD, masses or thyromegaly Cardiovascular:  Irregular rate and rhythm but rate controlled, no m/r/g. No LE edema.  Respiratory:   CTA bilaterally with no wheezes/rales/rhonchi.  Normal respiratory effort. Abdomen:  soft, NT, ND Skin:  no rash or induration seen on limited exam Musculoskeletal:  grossly normal tone BUE/BLE, good ROM, no bony abnormality Psychiatric:  grossly normal mood and affect, speech fluent and appropriate, AOx3 Neurologic:  CN 2-12 grossly intact, moves all extremities in coordinated fashion   Radiological Exams on Admission: Independently reviewed - see discussion in A/P where applicable  DG Chest Port 1 View  Result Date: 05/03/2021 CLINICAL DATA:   AFib. EXAM: PORTABLE CHEST 1 VIEW COMPARISON:  08/13/2020. FINDINGS: The heart is enlarged and the mediastinal contour is otherwise within normal limits. The pulmonary vasculature is mildly distended. No consolidation, effusion, or pneumothorax. No acute osseous abnormality. IMPRESSION: Cardiomegaly with pulmonary vascular congestion. Electronically Signed   By: Brett Fairy M.D.   On: 05/03/2021 02:40    EKG: Independently reviewed.   0145 - Afib with rate 155; LVH with no evidence of acute ischemia 0206 - Aflutter with rate 165; LVH with no evidence of acute ischemia 0345 - Afib with rate 1523 LVH with no evidence of acute ischemia  Labs on Admission: I have personally reviewed the available labs and imaging studies at the time of the admission.  Pertinent labs:    Glucose 237 Unremarkable CBC HCG <5 COVID/flu negative    Assessment and Plan: * Atrial fibrillation with rapid ventricular response ([place])- (present on admission) -Patient presenting with new-onset afib.  -Etiology is thought to be related to HTN and medication non-compliance -Cardioversion was considered in patient due to new-onset AF but she converted with IV Cardizem and is currently back in NSR. -Since the afib onset is unknown, will focus on rate control and searching for the underlying cause at this time. -Will admit to SDU for Diltiazem drip as per protocol with plan to transition to PO Diltiazem now -Will request Echocardiogram for further evaluation  -Will risk stratify with FLP and HgbA1c; will also order TSH and UDS.  -Will consult cardiology - awaiting input from Dr. Terri Skains -Heart rate is well controlled. -CHA2DS2-VASc Score is >2 and so patient would benefit from oral anticoagulation. -She has been started on Heparin but can likely transition to [place] therapy at this time. -Will request [facility] consult for cost analysis for DOAC agents.  HTN -She was previously taking Avapro and Coreg but stopped taking both  of these medications due to non-compliance  -Will add prn hydralazine  HLD -Start Lipitor -Lipids were checked in 07/2020 and LDL was 99; will recheck  DM -Last A1c was 10.6 on 02/12/21 - poor control -She stopped taking Actos -Will cover with moderate-scale SSI for now -She is likely to need long-term medication  History of stroke -She stopped taking ASA -May need to resume but will hold for now since she is starting [facility]  Morbid obesity with BMI of 50.0-59.9, adult ([place]) -Body mass index is 51.43 kg/m..  -Weight loss should be encouraged -She is compliant with Ozempic -Outpatient PCP/bariatric medicine/bariatric surgery f/u encouraged     Advance Care Planning:   Code Status: Full Code   Consults: Cardiology; TOC team  DVT Prophylaxis: Heparin  Family Communication: None present; she is capable of communicating with her family at this time  Severity of Illness: The appropriate patient status for this patient is INPATIENT. Inpatient status is judged to be reasonable and necessary in order to provide the required intensity of service to ensure the patient's safety. The patient's presenting symptoms, physical exam findings, and initial radiographic and laboratory data in the context of their chronic comorbidities is felt to place them at high risk for further clinical deterioration. Furthermore, it is not anticipated that the patient will be medically stable for discharge from the hospital within 2 midnights of admission.   * I certify that at the point of admission it is my clinical judgment that the patient will require inpatient hospital care spanning beyond 2 midnights from the point of admission due to high intensity of service, high risk for further deterioration and high frequency of surveillance required.*  Author: Karmen Bongo, MD 05/03/2021 3:56 PM  For on call review [email or web address].

## 2021-05-03 NOTE — TOC Initial Note (Signed)
Transition of Care Lindsay Gomez) - Initial/Assessment Note    Patient Details  Name: Lindsay Gomez MRN: [id number] Date of Birth: 1970/04/02  Transition of Care Lindsay Gomez [place]) CM/SW Contact:    Verdell Carmine, RN Phone Number: 05/03/2021, 8:51 AM  Clinical Narrative:                  The Transition of Care Department Lindsay Gomez) has reviewed patient and no TOC needs have been identified at this time. We will continue to monitor patient advancement through interdisciplinary progression rounds. If new patient transition needs arise, please place a TOC consult        Patient Goals and CMS Choice        Expected Discharge Plan and Services                                                Prior Living Arrangements/Services                       Activities of Daily Living      Permission Sought/Granted                  Emotional Assessment              Admission diagnosis:  Atrial fibrillation with rapid ventricular response ([place]) [I48.91] Patient Active Problem List   Diagnosis Date Noted   Atrial fibrillation with rapid ventricular response ([place]) 05/03/2021   Type 2 diabetes mellitus with hyperglycemia, without long-term current use of insulin (Lindsay Gomez) 02/12/2021   Diabetes mellitus (Lindsay Gomez) 02/12/2021   Type 2 diabetes mellitus with diabetic polyneuropathy, without long-term current use of insulin (Lindsay Gomez) 02/12/2021   Hypertensive emergency    Long-term insulin use (Lindsay Gomez)    History of stroke    Mild atherosclerosis of carotid artery, bilateral    Family history of premature CAD    Acute on chronic diastolic CHF (congestive heart failure) (Lindsay Gomez) 08/11/2020   Hypertensive urgency 08/11/2020   Noncompliance 05/13/2018   Chronic bilateral low back pain with bilateral sciatica 03/25/2018   Hepatic steatosis 08/16/2017   Thalamic stroke (Lindsay Gomez) 07/12/2017   Cerebrovascular accident (CVA) (Lindsay Gomez) 03/14/2017   Hypocalcemia 03/03/2017   Cerebral thrombosis with  cerebral infarction 03/03/2017   Major depressive disorder, single episode, moderate (Lindsay Gomez) 10/28/2015   Microcytic hypochromic anemia 07/29/2015   Type 2 diabetes mellitus with hyperlipidemia (Lindsay Gomez) 06/24/2015   Depression 06/24/2015   Obstructive sleep apnea 06/17/2015   Anxiety and depression 04/24/2015   Pre-op evaluation 01/09/2015   SOB (shortness of breath) 01/05/2014   Hyperlipidemia 01/06/2013   Right lumbar radiculopathy 10/26/2012   ANA positive 09/14/2012   LVH (left ventricular hypertrophy) 12/08/2011   Menorrhagia 09/24/2011   Routine general medical examination at a health care facility 09/24/2011   Carpal tunnel syndrome of right wrist 09/18/2010   Hypokalemia 09/18/2010   Morbid obesity with BMI of 50.0-59.9, adult (Lindsay Gomez) 11/29/2008   Iron deficiency anemia 11/28/2008   Essential hypertension 11/28/2008   ALLERGIC RHINITIS 11/28/2008   PCP:  Merryl Hacker No Pharmacy:   [place], Lindsay Gomez [place] Lindsay Gomez [address] Phone: [phone] Fax: [phone]  [facility] [address] Lindsay Gomez [address] Phone: [phone] Fax: Lindsay Gomez [address]  [place] Lindsay Gomez [address] Phone: [phone] Fax: [phone]     Social Determinants of Health (SDOH) Interventions    Readmission Risk Interventions No flowsheet data found.

## 2021-05-03 NOTE — ED Provider Notes (Signed)
[facility] EMERGENCY DEPARTMENT Provider Note   CSN: [id number] Arrival date & time: 05/03/21  0137     History  Chief Complaint  Patient presents with   Atrial Fibrillation   Level 5 caveat due to acuity of condition Lindsay Gomez is a 51 y.o. female.  The history is provided by the patient.  Atrial Fibrillation This is a new problem. The current episode started 1 to 2 hours ago. The problem occurs constantly. The problem has not changed since onset.Associated symptoms include chest pain and shortness of breath. Nothing aggravates the symptoms. Nothing relieves the symptoms.  Patient reports approximately 1 to 2 hours ago she began having palpitations, chest pain and shortness of breath.  She reports that her heart is racing She has never had this before.  She had otherwise been at her baseline Past Medical History:  Diagnosis Date   Allergy    rhinitis   Anemia    Anxiety    Diabetes mellitus without complication ([place])    Enlarged heart    Hyperlipidemia    Hypertension    Morbid obesity ([place])    Shortness of breath dyspnea    with low iron    Home Medications Prior to Admission medications   Medication Sig Start Date End Date Taking? Authorizing Provider  aspirin (ASPIRIN 81) 81 MG EC tablet Take 1 tablet (81 mg total) by mouth daily. Swallow whole. 03/24/18   Lance Sell, NP  atorvastatin (LIPITOR) 20 MG tablet Take 20 mg by mouth at bedtime.    [provider]  calcium carbonate (TUMS - DOSED IN MG ELEMENTAL CALCIUM) 500 MG chewable tablet Chew 1-2 tablets by mouth as needed for indigestion or heartburn.    [provider]  carvedilol (COREG) 6.25 MG tablet Take 1 tablet (6.25 mg total) by mouth 2 (two) times daily with a meal. 09/25/20   Tolia, Sunit, DO  Continuous Blood Gluc Sensor (FREESTYLE LIBRE 14 DAY SENSOR) MISC APPLY AS DIRECTED AND CHANGE EVERY 14 DAYS. 06/19/19   Shamleffer, Melanie Crazier, MD  Insulin Pen  Needle 32G X 4 MM MISC 1 Device by Does not apply route 2 (two) times daily. 03/23/19   Shamleffer, Melanie Crazier, MD  irbesartan (AVAPRO) 300 MG tablet Take 1 tablet (300 mg total) by mouth daily. 12/16/20 01/15/21  Tolia, Sunit, DO  Lancets MISC Use one lancet per test to check blood sugar 1-4 times daily. Dx code:E11.9 12/27/15   Golden Circle, FNP  OZEMPIC, 1 MG/DOSE, 4 MG/3ML SOPN Inject 1 mg into the skin once a week. 02/13/21   Shamleffer, Melanie Crazier, MD  pioglitazone (ACTOS) 30 MG tablet TAKE 1 TABLET ONCE DAILY. 10/16/19   Shamleffer, Melanie Crazier, MD      Allergies    Dulaglutide, Lisinopril, and Metformin hcl    Review of Systems   Review of Systems  Unable to perform ROS: Acuity of condition  Respiratory:  Positive for shortness of breath.   Cardiovascular:  Positive for chest pain.   Physical Exam Updated Vital Signs BP (!) 127/104    Pulse (!) 109    Temp 98.2 F (36.8 C) (Oral)    Resp 20    LMP 12/18/2014 (Exact Date)    SpO2 99%  Physical Exam CONSTITUTIONAL: Well developed/well nourished HEAD: Normocephalic/atraumatic EYES: EOMI/PERRL ENMT: Mucous membranes moist NECK: supple no meningeal signs SPINE/BACK:entire spine nontender CV: Tachycardic and irregular LUNGS: Lungs are clear to auscultation bilaterally, no apparent distress ABDOMEN: soft, obese  GU:no cva tenderness NEURO: Pt is awake/alert/appropriate, moves all extremitiesx4.  No facial droop.   EXTREMITIES: pulses normal/equal, full ROM SKIN: warm, color normal PSYCH: Mildly anxious ED Results / Procedures / Treatments   Labs (all labs ordered are listed, but only abnormal results are displayed) Labs Reviewed  BASIC METABOLIC PANEL - Abnormal; Notable for the following components:      Result Value   Glucose, Bld 237 (*)    All other components within normal limits  CBC - Abnormal; Notable for the following components:   RBC 5.45 (*)    HCT 46.5 (*)    All other components within normal  limits  CBG MONITORING, ED - Abnormal; Notable for the following components:   Glucose-Capillary 221 (*)    All other components within normal limits  RESP PANEL BY RT-PCR (FLU A&B, COVID) ARPGX2  MAGNESIUM  HEPARIN LEVEL (UNFRACTIONATED)  I-STAT BETA HCG BLOOD, ED ([facility], [facility], [facility] ONLY)    EKG EKG Interpretation  Date/Time:  Saturday May 03 2021 01:45:29 EST Ventricular Rate:  155 PR Interval:    QRS Duration: 92 QT Interval:  282 QTC Calculation: 453 R Axis:   29 Text Interpretation: Atrial fibrillation Probable LVH with secondary repol abnrm Confirmed by Ripley Fraise [phone]) on 05/03/2021 2:01:14 AM   EKG Interpretation  Date/Time:  Saturday May 03 2021 03:45:04 EST Ventricular Rate:  123 PR Interval:    QRS Duration: 91 QT Interval:  350 QTC Calculation: 501 R Axis:   40 Text Interpretation: Atrial fibrillation Minimal ST depression, lateral leads Borderline prolonged QT interval Confirmed by Ripley Fraise [phone]) on 05/03/2021 3:50:40 AM         Radiology DG Chest Port 1 View  Result Date: 05/03/2021 CLINICAL DATA:  AFib. EXAM: PORTABLE CHEST 1 VIEW COMPARISON:  08/13/2020. FINDINGS: The heart is enlarged and the mediastinal contour is otherwise within normal limits. The pulmonary vasculature is mildly distended. No consolidation, effusion, or pneumothorax. No acute osseous abnormality. IMPRESSION: Cardiomegaly with pulmonary vascular congestion. Electronically Signed   By: Brett Fairy M.D.   On: 05/03/2021 02:40    Procedures .Critical Care Performed by: Ripley Fraise, MD Authorized by: Ripley Fraise, MD   Critical care provider statement:    Critical care time (minutes):  76   Critical care start time:  05/03/2021 2:44 AM   Critical care end time:  05/03/2021 4:00 AM   Critical care time was exclusive of:  Separately billable procedures and treating other patients   Critical care was necessary to treat or prevent imminent or life-threatening  deterioration of the following conditions:  Cardiac failure and circulatory failure   Critical care was time spent personally by me on the following activities:  Development of treatment plan with patient or surrogate, discussions with consultants, obtaining history from patient or surrogate, re-evaluation of patient's condition, pulse oximetry, ordering and review of radiographic studies, ordering and review of laboratory studies, ordering and performing treatments and interventions, review of old charts and evaluation of patient's response to treatment   I assumed direction of critical care for this patient from another provider in my specialty: no     Care discussed with: admitting provider      Medications Ordered in ED Medications  diltiazem (CARDIZEM) 1 mg/mL load via infusion 10 mg (10 mg Intravenous Bolus from Bag 05/03/21 0259)    And  diltiazem (CARDIZEM) 125 mg in dextrose 5% 125 mL (1 mg/mL) infusion (10 mg/hr Intravenous Rate/Dose Change 05/03/21 0341)  heparin bolus  via infusion 4,000 Units (has no administration in time range)  heparin ADULT infusion 100 units/mL (25000 units/257mL) (has no administration in time range)    ED Course/ Medical Decision Making/ A&P Clinical Course as of 05/03/21 0423  Sat May 03, 2021  0228 Patient with new onset atrial fibrillation.  Her EKG at 2:06 AM does reveal wide-complex that could be nonsustained V. tach.  We will consult her cardiologist Dr. Terri Skains [DW]  [phone] Discussed with Dr. Terri Skains.  He is reviewed the EKG.  He feels starting Cardizem would be appropriate and starting heparin for anticoagulation.  He does not think the NSVT is significant at this time.Marland Kitchen  He does not feel that she is appropriate for cardioversion.  Request medical admission [DW]  0351 Patient's heart rate is improving.  She will be admitted [DW]  0401 Glucose(!): 237 Hyperglycemia [DW]    Clinical Course User Index [DW] Ripley Fraise, MD       CHA2DS2-VASc Score: 4                     Medical Decision Making Amount and/or Complexity of Data Reviewed Labs: ordered. Decision-making details documented in ED Course. Radiology: ordered. ECG/medicine tests: ordered and independent interpretation performed.  Risk Prescription drug management. Decision regarding hospitalization.   This patient presents to the ED for concern of chest pain, shortness of breath, palpitation, this involves an extensive number of treatment options, and is a complaint that carries with it a high risk of complications and morbidity.  The differential diagnosis includes atrial fibrillation, ventricular tachycardia, pulmonary embolism, acute coronary syndrome  Comorbidities that complicate the patient evaluation: Patients presentation is complicated by their history of CHF   Additional history obtained: Additional history obtained from family Records reviewed previous cardiology notes were noted  Lab Tests: I Ordered, and personally interpreted labs.  The pertinent results include:  hyperglycemia   Imaging Studies ordered: I ordered imaging studies including X-ray chest I independently visualized and interpreted imaging which showed cardiomegaly I agree with the radiologist interpretation  Cardiac Monitoring: The patient was maintained on a cardiac monitor.  I personally viewed and interpreted the cardiac monitor which showed an underlying rhythm of:  Atrial Fibrillation  Medicines ordered and prescription drug management: I ordered medication including Cardizem for atrial fibrillation Reevaluation of the patient after these medicines showed that the patient    improved  Critical Interventions:       IV Cardizem  Consultations Obtained: I requested consultation with the admitting physician Dr. Marlowe Sax and consultant Dr Terri Skains with cardiology, and discussed  findings as well as pertinent plan - they recommend: admit, start cardizem  Reevaluation: After the  interventions noted above, I reevaluated the patient and found that they have :improved  Complexity of problems addressed: Patients presentation is most consistent with  acute presentation with potential threat to life or bodily function      Disposition: After consideration of the diagnostic results and the patients response to treatment,  I feel that the patent would benefit from admission  .            Final Clinical Impression(s) / ED Diagnoses Final diagnoses:  Atrial fibrillation with RVR ([place])  Hyperglycemia    Rx / DC Orders ED Discharge Orders          Ordered    Amb referral to AFIB Clinic        05/03/21 0202  Ripley Fraise, MD 05/03/21 [phone]

## 2021-05-03 NOTE — Progress Notes (Signed)
ANTICOAGULATION CONSULT NOTE - Initial Consult  Pharmacy Consult for heparin Indication: atrial fibrillation  Allergies  Allergen Reactions   Dulaglutide Other (See Comments)   Lisinopril Other (See Comments)    Cough    Metformin Hcl Other (See Comments)    Patient Measurements: Height: 5\' 2"  (157.5 cm) Weight: 128.8 kg (284 lb) (from Nov 2022 records) IBW/kg (Calculated) : 50.1 Heparin Dosing Weight: 85kg  Vital Signs: Temp: 98.2 F (36.8 C) (02/18 0150) Temp Source: Oral (02/18 0150) BP: 127/104 (02/18 0150) Pulse Rate: 109 (02/18 0150)   Medical History: Past Medical History:  Diagnosis Date   Allergy    rhinitis   Anemia    Anxiety    Diabetes mellitus without complication ([place])    Enlarged heart    Hyperlipidemia    Hypertension    Morbid obesity (HCC)    Shortness of breath dyspnea    with low iron    Assessment: 51yo female c/o CP and SOB, EMS found pt to be in Afib w/ RVR w/ rate to 190s, no known h/o Afib, to begin heparin.  Of note pt has multiple BP and DM meds prescribed but reports that she does not take most of them.  Goal of Therapy:  Heparin level 0.3-0.7 units/ml Monitor platelets by anticoagulation protocol: Yes   Plan:  Heparin 4000 units IV bolus x1 followed by infusion at 1400 units/hr and monitor heparin levels and CBC.  Wynona Neat, PharmD, BCPS  05/03/2021,2:50 AM

## 2021-05-03 NOTE — Assessment & Plan Note (Addendum)
Patient presenting with chest discomfort, palpitation and was noted to be in A-fib with RVR with rates in the 150s.  Etiology likely secondary to essential hypertension and with medication noncompliance.  TSH within normal limits, UDS negative.  TTE with LVEF 55-60%, no LV regional wall motion abnormality, LVH, grade 2 diastolic dysfunction, elevated LAP, LA mildly dilated, mild MR, no aortic stenosis.  Initially was placed on IV Cardizem drip in which she converted back to normal sinus rhythm and Cardizem was transitioned to 180 mg p.o. daily.  Patient was started on Eliquis 5 mg p.o. twice daily.  Outpatient follow-up with cardiology.

## 2021-05-03 NOTE — Progress Notes (Signed)
ANTICOAGULATION CONSULT NOTE   Pharmacy Consult for heparin Indication: atrial fibrillation  Allergies  Allergen Reactions   Dulaglutide Other (See Comments)   Lisinopril Other (See Comments)    Cough    Metformin Hcl Other (See Comments)    Patient Measurements: Height: 5\' 2"  (157.5 cm) Weight: 127.6 kg (281 lb 3.2 oz) IBW/kg (Calculated) : 50.1 Heparin Dosing Weight: 85kg  Vital Signs: Temp: 98.6 F (37 C) (02/18 1644) Temp Source: Oral (02/18 1644) BP: 157/85 (02/18 1644) Pulse Rate: 85 (02/18 1644)   Medical History: Past Medical History:  Diagnosis Date   Allergy    rhinitis   Anemia    Anxiety    Atrial fibrillation (HCC)    Diabetes mellitus without complication (HCC)    glucose usually 140-150   Enlarged heart    Hyperlipidemia    Hypertension    Morbid obesity ([facility]CC)    Shortness of breath dyspnea    with low iron    Assessment: 51yo female c/o CP and SOB, EMS found pt to be in Afib w/ RVR w/ rate to 190s, no known h/o Afib, to begin heparin.  Of note pt has multiple BP and DM meds prescribed but reports that she does not take most of them. -heparin level confirmed at goal on 1400 units/hr   Goal of Therapy:  Heparin level 0.3-0.7 units/ml Monitor platelets by anticoagulation protocol: Yes   Plan:  -Continue heparin infusion at 1400 units/hr -Monitor daily HL, CBC and watch for s/s of bleeding   Hildred Laser, PharmD Clinical Pharmacist **Pharmacist phone directory can now be found on [email or web address] ([email or web address]).  Listed under [place].

## 2021-05-03 NOTE — Assessment & Plan Note (Addendum)
Body mass index is 51.43 kg/m.  Patient on [place] outpatient.  Discussed with patient needs for aggressive lifestyle changes/weight loss as this complicates all facets of care.  Outpatient follow-up with PCP.  May benefit from bariatric evaluation outpatient.

## 2021-05-03 NOTE — ED Notes (Signed)
PT having several runs of PVC's, MD Yates paged. RN to reshoot EKG.

## 2021-05-03 NOTE — Discharge Instructions (Signed)

## 2021-05-04 ENCOUNTER — Inpatient Hospital Stay (HOSPITAL_COMMUNITY): Payer: BC Managed Care – PPO

## 2021-05-04 DIAGNOSIS — I4891 Unspecified atrial fibrillation: Secondary | ICD-10-CM | POA: Diagnosis not present

## 2021-05-04 LAB — ECHOCARDIOGRAM COMPLETE
Area-P 1/2: 2.71 cm2
Height: 62 in
S' Lateral: 3.6 cm
Weight: 4499.2 oz

## 2021-05-04 LAB — BASIC METABOLIC PANEL
Anion gap: 10 (ref 5–15)
BUN: 7 mg/dL (ref 6–20)
CO2: 24 mmol/L (ref 22–32)
Calcium: 8.4 mg/dL — ABNORMAL LOW (ref 8.9–10.3)
Chloride: 105 mmol/L (ref 98–111)
Creatinine, Ser: 0.66 mg/dL (ref 0.44–1.00)
GFR, Estimated: >60 mL/min (ref >60)
Glucose, Bld: 171 mg/dL — ABNORMAL HIGH (ref 70–99)
Potassium: 3.4 mmol/L — ABNORMAL LOW (ref 3.5–5.1)
Sodium: 139 mmol/L (ref 135–145)

## 2021-05-04 LAB — CBC
HCT: 43 % (ref 36.0–46.0)
Hemoglobin: 14.3 g/dL (ref 12.0–15.0)
MCH: 27.9 pg (ref 26.0–34.0)
MCHC: 33.3 g/dL (ref 30.0–36.0)
MCV: 84 fL (ref 80.0–100.0)
Platelets: 232 10*3/uL (ref 150–400)
RBC: 5.12 MIL/uL — ABNORMAL HIGH (ref 3.87–5.11)
RDW: 14.2 % (ref 11.5–15.5)
WBC: 6.1 10*3/uL (ref 4.0–10.5)
nRBC: 0 % (ref 0.0–0.2)

## 2021-05-04 LAB — GLUCOSE, CAPILLARY
Glucose-Capillary: 210 mg/dL — ABNORMAL HIGH (ref 70–99)
Glucose-Capillary: 213 mg/dL — ABNORMAL HIGH (ref 70–99)

## 2021-05-04 LAB — LDL CHOLESTEROL, DIRECT: Direct LDL: 120.2 mg/dL — ABNORMAL HIGH (ref 0–99)

## 2021-05-04 MED ORDER — ROSUVASTATIN CALCIUM 20 MG PO TABS: 20.0000 mg | ORAL_TABLET | Freq: Every day | ORAL | 2 refills | Status: DC

## 2021-05-04 MED ORDER — DILTIAZEM HCL ER COATED BEADS 180 MG PO CP24: 180.0000 mg | ORAL_CAPSULE | Freq: Every day | ORAL | 2 refills | Status: DC

## 2021-05-04 MED ORDER — IRBESARTAN 300 MG PO TABS: 300.0000 mg | ORAL_TABLET | Freq: Every day | ORAL | 2 refills | Status: DC

## 2021-05-04 MED ORDER — SPIRONOLACTONE 25 MG PO TABS: 25.0000 mg | ORAL_TABLET | Freq: Every day | ORAL | 2 refills | Status: DC

## 2021-05-04 MED ORDER — POTASSIUM CHLORIDE CRYS ER 20 MEQ PO TBCR: 20.0000 meq | EXTENDED_RELEASE_TABLET | Freq: Every day | ORAL | 2 refills | Status: DC

## 2021-05-04 MED ORDER — POTASSIUM CHLORIDE CRYS ER 20 MEQ PO TBCR
20.0000 meq | EXTENDED_RELEASE_TABLET | Freq: Once | ORAL | Status: AC
  Administered 2021-05-04: 20 meq via ORAL
  Filled 2021-05-04: qty 1

## 2021-05-04 MED ORDER — APIXABAN 5 MG PO TABS: 5.0000 mg | ORAL_TABLET | Freq: Two times a day (BID) | ORAL | 2 refills | Status: DC

## 2021-05-04 NOTE — Discharge Summary (Signed)
Physician Discharge Summary  Lindsay Gomez NWG:[id number] DOB: 1970-10-17 DOA: 05/03/2021  PCP: Merryl Hacker, No  Admit date: 05/03/2021 Discharge date: 05/04/2021  Admitted From: Home Disposition: Home  Recommendations for Outpatient Follow-up:  Follow up with PCP in 1-2 weeks Follow-up with cardiology Dr. Terri Skains, office will schedule appointment Please obtain BMP/CBC in one week Please follow up on the following pending results:  Home Health: No Equipment/Devices: None  Discharge Condition: Stable CODE STATUS: Full code Diet recommendation: Heart healthy/consistent carb regular diet  History of present illness:  Lindsay Gomez is a 51 year old female with past medical history significant for essential hypertension, type 2 diabetes mellitus, HLD, OSA not on CPAP, morbid obesity, history of medical noncompliance who presents to [facility] ED on 2/18 with chest pain and shortness of breath.  Patient reports a fluttering sensation in her chest with pain described as dull.  In the ED, temperature 90.2 F, HR 151, RR 20, BP 120s over 104, SPO2 99% on room air.  WBC 6.1, hemoglobin 14.6, platelets 279.  Sodium 139, potassium 3.6, chloride 104, CO2 24, BUN 10, creatinine 0.74, glucose 237.  Magnesium 1.8.  BNP 123.8.  TSH 1.092.  hCG negative.  UDS negative.  Influenza A/B PCR negative.  COVID-19 PCR negative.  Chest x-ray with cardiomegaly with pulmonary vascular congestion.  Cardio was consulted.  Patient was started on a diltiazem drip for new onset A-fib with RVR.  TRH consulted for further evaluation and management of chest pain, A-fib with RVR.  Hospital course:  Assessment and Plan: * Atrial fibrillation with rapid ventricular response ([place])- (present on admission) Patient presenting with chest discomfort, palpitation and was noted to be in A-fib with RVR with rates in the 150s.  Etiology likely secondary to essential hypertension and with medication noncompliance.  TSH within normal limits,  UDS negative.  TTE with LVEF 55-60%, no LV regional wall motion abnormality, LVH, grade 2 diastolic dysfunction, elevated LAP, LA mildly dilated, mild MR, no aortic stenosis.  Initially was placed on IV Cardizem drip in which she converted back to normal sinus rhythm and Cardizem was transitioned to 180 mg p.o. daily.  Patient was started on Eliquis 5 mg p.o. twice daily.  Outpatient follow-up with cardiology.    Essential hypertension- (present on admission) Currently off antihypertensives outpatient.  Restarted on irbesartan 300 mg p.o. daily, CardizemCD 180mg  PO daily, spironolactone 25 mg p.o. daily.  Restarted on statin, Crestor 20 mg p.o. daily.  Hyperlipidemia- (present on admission) Total cholesterol 173, HDL 41, LDL 113, triglycerides 95.  Not on statin outpatient.  Started on Crestor 20 mg p.o. daily.  Type 2 diabetes mellitus with hyperglycemia, without long-term current use of insulin ([place])- (present on admission) Hemoglobin A1c 7.3.  On Ozempic outpatient.   History of stroke Patient reports she is no longer taking aspirin.  Started on Eliquis as above.  Morbid obesity with BMI of 50.0-59.9, adult (HCC) Body mass index is 51.43 kg/m.  Patient on [place] outpatient.  Discussed with patient needs for aggressive lifestyle changes/weight loss as this complicates all facets of care.  Outpatient follow-up with PCP.  May benefit from bariatric evaluation outpatient.       Discharge Diagnoses:  Principal Problem:   Atrial fibrillation with rapid ventricular response (HCC) Active Problems:   Essential hypertension   Hyperlipidemia   Type 2 diabetes mellitus with hyperglycemia, without long-term current use of insulin (HCC)   History of stroke   Morbid obesity with BMI of 50.0-59.9, adult ([place])  Discharge Instructions  Discharge Instructions     Amb referral to AFIB Clinic   Complete by: As directed    Call MD for:  difficulty breathing, headache or visual  disturbances   Complete by: As directed    Call MD for:  extreme fatigue   Complete by: As directed    Call MD for:  persistant dizziness or light-headedness   Complete by: As directed    Call MD for:  persistant nausea and vomiting   Complete by: As directed    Call MD for:  severe uncontrolled pain   Complete by: As directed    Call MD for:  temperature >100.4   Complete by: As directed    Diet - low sodium heart healthy   Complete by: As directed    Increase activity slowly   Complete by: As directed       Allergies as of 05/04/2021       Reactions   Dulaglutide Other (See Comments)   Lisinopril Other (See Comments)   Cough    Metformin Hcl Other (See Comments)        Medication List     STOP taking these medications    aspirin 81 MG EC tablet Commonly known as: Aspirin 81   carvedilol 6.25 MG tablet Commonly known as: COREG   FreeStyle Libre 14 Day Sensor Misc   Insulin Pen Needle 32G X 4 MM Misc   Lancets Misc   pioglitazone 30 MG tablet Commonly known as: ACTOS       TAKE these medications    apixaban 5 MG Tabs tablet Commonly known as: ELIQUIS Take 1 tablet (5 mg total) by mouth 2 (two) times daily.   bismuth subsalicylate 701 MG chewable tablet Commonly known as: PEPTO BISMOL Chew 524 mg by mouth as needed for indigestion or diarrhea or loose stools.   diltiazem 180 MG 24 hr capsule Commonly known as: Cardizem CD Take 1 capsule (180 mg total) by mouth daily.   irbesartan 300 MG tablet Commonly known as: AVAPRO Take 1 tablet (300 mg total) by mouth daily.   Ozempic (1 MG/DOSE) 4 MG/3ML Sopn Generic drug: Semaglutide (1 MG/DOSE) Inject 1 mg into the skin once a week. What changed: when to take this   potassium chloride SA 20 MEQ tablet Commonly known as: KLOR-CON M Take 1 tablet (20 mEq total) by mouth daily.   rosuvastatin 20 MG tablet Commonly known as: CRESTOR Take 1 tablet (20 mg total) by mouth at bedtime.   spironolactone  25 MG tablet Commonly known as: Aldactone Take 1 tablet (25 mg total) by mouth daily.        Follow-up Information     Tolia, Sunit, DO Follow up in 1 week(s).   Specialties: Cardiology, Vascular Surgery Contact information: [place] [address] [phone]                Allergies  Allergen Reactions   Dulaglutide Other (See Comments)   Lisinopril Other (See Comments)    Cough    Metformin Hcl Other (See Comments)    Consultations: Cardiology, Dr. Terri Skains   Procedures/Studies: DG Chest Port 1 View  Result Date: 05/03/2021 CLINICAL DATA:  AFib. EXAM: PORTABLE CHEST 1 VIEW COMPARISON:  08/13/2020. FINDINGS: The heart is enlarged and the mediastinal contour is otherwise within normal limits. The pulmonary vasculature is mildly distended. No consolidation, effusion, or pneumothorax. No acute osseous abnormality. IMPRESSION: Cardiomegaly with pulmonary vascular congestion. Electronically Signed  By: Brett Fairy M.D.   On: 05/03/2021 02:40   ECHOCARDIOGRAM COMPLETE  Result Date: 05/04/2021    ECHOCARDIOGRAM REPORT   Patient Name:   Lindsay Gomez Date of Exam: 05/04/2021 Medical Rec #:  [id number]         Height:       62.0 in Accession #:    [id number]        Weight:       281.2 lb Date of Birth:  January 12, 1971         BSA:          2.210 m Patient Age:    47 years          BP:           150/82 mmHg Patient Gender: F                 HR:           78 bpm. Exam Location:  Inpatient Procedure: 2D Echo, Cardiac Doppler and Color Doppler Indications:     Atrial Fib I48.91  History:         Patient has prior history of Echocardiogram examinations, most                  recent 08/12/2020. CHF, Stroke, Arrythmias:Atrial Fibrillation;                  Risk Factors:Hypertension, Diabetes and Dyslipidemia.  Sonographer:     Alvino Chapel RCS Referring Phys:  [id number] JENNIFER YATES Diagnosing Phys: Rex Kras DO IMPRESSIONS  1. Left ventricular ejection fraction,  by estimation, is 55 to 60%. The left ventricle has normal function. The left ventricle has no regional wall motion abnormalities. There is moderate left ventricular hypertrophy. Left ventricular diastolic parameters are consistent with Grade II diastolic dysfunction (pseudonormalization). Elevated left atrial pressure.  2. Right ventricular systolic function is normal. The right ventricular size is normal.  3. Left atrial size was mildly dilated.  4. The mitral valve is normal in structure. Mild mitral valve regurgitation. No evidence of mitral stenosis.  5. The aortic valve is normal in structure. Aortic valve regurgitation is not visualized. No aortic stenosis is present. Comparison(s): Compared to prior study 08/12/2020: Mildly dilated LA, Grade 2 diastolic dysfunction and elevated LAP are new findings otherwise, no significant change. FINDINGS  Left Ventricle: Left ventricular ejection fraction, by estimation, is 55 to 60%. The left ventricle has normal function. The left ventricle has no regional wall motion abnormalities. The left ventricular internal cavity size was normal in size. There is  moderate left ventricular hypertrophy. Left ventricular diastolic parameters are consistent with Grade II diastolic dysfunction (pseudonormalization). Elevated left atrial pressure. Right Ventricle: The right ventricular size is normal. No increase in right ventricular wall thickness. Right ventricular systolic function is normal. Left Atrium: Left atrial size was mildly dilated. Right Atrium: Right atrial size was normal in size. Pericardium: Trivial pericardial effusion is present. Mitral Valve: The mitral valve is normal in structure. Mild mitral valve regurgitation. No evidence of mitral valve stenosis. Tricuspid Valve: The tricuspid valve is normal in structure. Tricuspid valve regurgitation is not demonstrated. No evidence of tricuspid stenosis. Aortic Valve: The aortic valve is normal in structure. Aortic valve  regurgitation is not visualized. No aortic stenosis is present. Pulmonic Valve: The pulmonic valve was normal in structure. Pulmonic valve regurgitation is not visualized. No evidence of pulmonic stenosis. Aorta: The aortic root and ascending aorta are structurally normal, with  no evidence of dilitation. IAS/Shunts: The interatrial septum was not well visualized.  LEFT VENTRICLE PLAX 2D LVIDd:         5.10 cm   Diastology LVIDs:         3.60 cm   LV e' medial:    3.98 cm/s LV PW:         1.40 cm   LV E/e' medial:  15.6 LV IVS:        1.40 cm   LV e' lateral:   4.13 cm/s LVOT diam:     2.00 cm   LV E/e' lateral: 15.1 LV SV:         54 LV SV Index:   25 LVOT Area:     3.14 cm  RIGHT VENTRICLE RV S prime:     11.50 cm/s TAPSE (M-mode): 2.3 cm LEFT ATRIUM             Index        RIGHT ATRIUM           Index LA diam:        4.80 cm 2.17 cm/m   RA Area:     19.70 cm LA Vol (A2C):   78.3 ml 35.44 ml/m  RA Volume:   55.80 ml  25.25 ml/m LA Vol (A4C):   81.7 ml 36.98 ml/m LA Biplane Vol: 80.5 ml 36.43 ml/m  AORTIC VALVE LVOT Vmax:   85.30 cm/s LVOT Vmean:  65.100 cm/s LVOT VTI:    0.173 m  AORTA Ao Root diam: 3.10 cm MITRAL VALVE MV Area (PHT): 2.71 cm    SHUNTS MV Decel Time: 280 msec    Systemic VTI:  0.17 m MV E velocity: 62.20 cm/s  Systemic Diam: 2.00 cm MV A velocity: 87.90 cm/s MV E/A ratio:  0.71 Sunit Tolia DO Electronically signed by Rex Kras DO Signature Date/Time: 05/04/2021/2:50:47 PM    Final      Subjective: Patient seen and examined at bedside, resting comfortably.  Heart rate remains controlled and now converted back to normal sinus rhythm.  Seen by cardiology, Dr. Terri Skains; okay for discharge home with outpatient follow-up.  Discussed with patient's medication changes and additions.  Patient with no other questions or concerns at this time.  Patient denies headache, no visual changes, no current chest pain/palpitations, no nausea/vomiting/diarrhea, no abdominal pain, no fever/chills/night  sweats, no weakness, no fatigue, no paresthesias.  No acute events overnight per nursing staff.  Discharge Exam: Vitals:   05/04/21 0700 05/04/21 1319  BP: (!) 150/82 (!) 158/93  Pulse: 78   Resp: 19   Temp: 98.3 F (36.8 C)   SpO2: 96%    Vitals:   05/04/21 0030 05/04/21 0402 05/04/21 0700 05/04/21 1319  BP: (!) 157/88 (!) 155/89 (!) 150/82 (!) 158/93  Pulse: 80 79 78   Resp: 20 20 19    Temp: 98.3 F (36.8 C) 97.9 F (36.6 C) 98.3 F (36.8 C)   TempSrc: Oral Oral Oral   SpO2: 100% 99% 96%   Weight:      Height:        Physical Exam: GEN: NAD, alert and oriented x 3, obese HEENT: NCAT, PERRL, EOMI, sclera clear, MMM PULM: CTAB w/o wheezes/crackles, normal respiratory effort, on room air CV: RRR w/o M/G/R GI: abd soft, NTND, NABS, no R/G/M MSK: no peripheral edema, muscle strength globally intact 5/5 bilateral upper/lower extremities NEURO: CN II-XII intact, no focal deficits, sensation to light touch intact PSYCH: normal mood/affect Integumentary: dry/intact,  no rashes or wounds    The results of significant diagnostics from this hospitalization (including imaging, microbiology, ancillary and laboratory) are listed below for reference.     Microbiology: Recent Results (from the past 240 hour(s))  Resp Panel by RT-PCR (Flu A&B, Covid) Nasopharyngeal Swab     Status: None   Collection Time: 05/03/21  3:17 AM   Specimen: Nasopharyngeal Swab; Nasopharyngeal(NP) swabs in vial transport medium  Result Value Ref Range Status   SARS Coronavirus 2 by RT PCR NEGATIVE NEGATIVE Final    Comment: (NOTE) SARS-CoV-2 target nucleic acids are NOT DETECTED.  The SARS-CoV-2 RNA is generally detectable in upper respiratory specimens during the acute phase of infection. The lowest concentration of SARS-CoV-2 viral copies this assay can detect is 138 copies/mL. A negative result does not preclude SARS-Cov-2 infection and should not be used as the sole basis for treatment or other  patient management decisions. A negative result may occur with  improper specimen collection/handling, submission of specimen other than nasopharyngeal swab, presence of viral mutation(s) within the areas targeted by this assay, and inadequate number of viral copies(<138 copies/mL). A negative result must be combined with clinical observations, patient history, and epidemiological information. The expected result is Negative.  Fact Sheet for Patients:  EntrepreneurPulse.com.au  Fact Sheet for Healthcare Providers:  IncredibleEmployment.be  This test is no t yet approved or cleared by the [place] FDA and  has been authorized for detection and/or diagnosis of SARS-CoV-2 by FDA under an Emergency Use Authorization (EUA). This EUA will remain  in effect (meaning this test can be used) for the duration of the COVID-19 declaration under Section 564(b)(1) of the Act, 21 U.S.C.section 360bbb-3(b)(1), unless the authorization is terminated  or revoked sooner.       Influenza A by PCR NEGATIVE NEGATIVE Final   Influenza B by PCR NEGATIVE NEGATIVE Final    Comment: (NOTE) The Xpert Xpress SARS-CoV-2/FLU/RSV plus assay is intended as an aid in the diagnosis of influenza from Nasopharyngeal swab specimens and should not be used as a sole basis for treatment. Nasal washings and aspirates are unacceptable for Xpert Xpress SARS-CoV-2/FLU/RSV testing.  Fact Sheet for Patients: EntrepreneurPulse.com.au  Fact Sheet for Healthcare Providers: IncredibleEmployment.be  This test is not yet approved or cleared by the [place] FDA and has been authorized for detection and/or diagnosis of SARS-CoV-2 by FDA under an Emergency Use Authorization (EUA). This EUA will remain in effect (meaning this test can be used) for the duration of the COVID-19 declaration under Section 564(b)(1) of the Act, 21 U.S.C. section  360bbb-3(b)(1), unless the authorization is terminated or revoked.  Performed at [facility], [place] [address], [place], [place] [address]      Labs: BNP (last 3 results) Recent Labs    08/11/20 1851 05/03/21 1528  BNP 90.0 035.4*   Basic Metabolic Panel: Recent Labs  Lab 05/03/21 0200 05/04/21 0356  NA 139 139  K 3.6 3.4*  CL 104 105  CO2 24 24  GLUCOSE 237* 171*  BUN 10 7  CREATININE 0.74 0.66  CALCIUM 8.9 8.4*  MG 1.8  --    Liver Function Tests: No results for input(s): AST, ALT, ALKPHOS, BILITOT, PROT, ALBUMIN in the last 168 hours. No results for input(s): LIPASE, AMYLASE in the last 168 hours. No results for input(s): AMMONIA in the last 168 hours. CBC: Recent Labs  Lab 05/03/21 0200 05/04/21 0356  WBC 6.1 6.1  HGB 14.6 14.3  HCT 46.5* 43.0  MCV 85.3 84.0  PLT 279 232   Cardiac Enzymes: No results for input(s): CKTOTAL, CKMB, CKMBINDEX, TROPONINI in the last 168 hours. BNP: Invalid input(s): POCBNP CBG: Recent Labs  Lab 05/03/21 1342 05/03/21 1647 05/03/21 1959 05/04/21 0828 05/04/21 1311  GLUCAP 128* 182* 199* 210* 213*   D-Dimer No results for input(s): DDIMER in the last 72 hours. Hgb A1c Recent Labs    05/03/21 1528  HGBA1C 7.3*   Lipid Profile Recent Labs    05/03/21 1824 05/04/21 0356  CHOL 173  --   HDL 41  --   LDLCALC 113*  --   TRIG 95  --   CHOLHDL 4.2  --   LDLDIRECT  --  120.2*   Thyroid function studies Recent Labs    05/03/21 1121  TSH 1.092   Anemia work up No results for input(s): VITAMINB12, FOLATE, FERRITIN, TIBC, IRON, RETICCTPCT in the last 72 hours. Urinalysis    Component Value Date/Time   COLORURINE YELLOW 09/07/2017 0526   APPEARANCEUR CLEAR 09/07/2017 0526   LABSPEC 1.022 09/07/2017 0526   PHURINE 5.0 09/07/2017 0526   GLUCOSEU >=500 (A) 09/07/2017 0526   GLUCOSEU NEGATIVE 11/18/2012 1625   HGBUR NEGATIVE 09/07/2017 0526   BILIRUBINUR NEGATIVE 09/07/2017 0526   BILIRUBINUR negative  06/17/2015 1534   KETONESUR NEGATIVE 09/07/2017 0526   PROTEINUR NEGATIVE 09/07/2017 0526   UROBILINOGEN negative 06/17/2015 1534   UROBILINOGEN 0.2 06/12/2013 1228   NITRITE NEGATIVE 09/07/2017 0526   LEUKOCYTESUR NEGATIVE 09/07/2017 0526   Sepsis Labs Invalid input(s): PROCALCITONIN,  WBC,  LACTICIDVEN Microbiology Recent Results (from the past 240 hour(s))  Resp Panel by RT-PCR (Flu A&B, Covid) Nasopharyngeal Swab     Status: None   Collection Time: 05/03/21  3:17 AM   Specimen: Nasopharyngeal Swab; Nasopharyngeal(NP) swabs in vial transport medium  Result Value Ref Range Status   SARS Coronavirus 2 by RT PCR NEGATIVE NEGATIVE Final    Comment: (NOTE) SARS-CoV-2 target nucleic acids are NOT DETECTED.  The SARS-CoV-2 RNA is generally detectable in upper respiratory specimens during the acute phase of infection. The lowest concentration of SARS-CoV-2 viral copies this assay can detect is 138 copies/mL. A negative result does not preclude SARS-Cov-2 infection and should not be used as the sole basis for treatment or other patient management decisions. A negative result may occur with  improper specimen collection/handling, submission of specimen other than nasopharyngeal swab, presence of viral mutation(s) within the areas targeted by this assay, and inadequate number of viral copies(<138 copies/mL). A negative result must be combined with clinical observations, patient history, and epidemiological information. The expected result is Negative.  Fact Sheet for Patients:  EntrepreneurPulse.com.au  Fact Sheet for Healthcare Providers:  IncredibleEmployment.be  This test is no t yet approved or cleared by the [place] FDA and  has been authorized for detection and/or diagnosis of SARS-CoV-2 by FDA under an Emergency Use Authorization (EUA). This EUA will remain  in effect (meaning this test can be used) for the duration of the COVID-19  declaration under Section 564(b)(1) of the Act, 21 U.S.C.section 360bbb-3(b)(1), unless the authorization is terminated  or revoked sooner.       Influenza A by PCR NEGATIVE NEGATIVE Final   Influenza B by PCR NEGATIVE NEGATIVE Final    Comment: (NOTE) The Xpert Xpress SARS-CoV-2/FLU/RSV plus assay is intended as an aid in the diagnosis of influenza from Nasopharyngeal swab specimens and should not be used as a sole basis for treatment. Nasal washings and aspirates  are unacceptable for Xpert Xpress SARS-CoV-2/FLU/RSV testing.  Fact Sheet for Patients: EntrepreneurPulse.com.au  Fact Sheet for Healthcare Providers: IncredibleEmployment.be  This test is not yet approved or cleared by the [place] FDA and has been authorized for detection and/or diagnosis of SARS-CoV-2 by FDA under an Emergency Use Authorization (EUA). This EUA will remain in effect (meaning this test can be used) for the duration of the COVID-19 declaration under Section 564(b)(1) of the Act, 21 U.S.C. section 360bbb-3(b)(1), unless the authorization is terminated or revoked.  Performed at [facility], [place] [address] [place], [place] [address]      Time coordinating discharge: Over 30 minutes  SIGNED:   Donnamarie Poag [place] DO  [facility] 05/04/2021, 3:12 PM

## 2021-05-04 NOTE — TOC Transition Note (Signed)
Transition of Care [facility] - CM/SW Discharge Note   Patient Details  Name: Lindsay Gomez MRN: [id number] Date of Birth: 10/17/1970  Transition of Care [facility]) CM/SW Contact:  Konrad Penta, RN Phone Number: [phone] 05/04/2021, 3:37 PM   Clinical Narrative:   Provided patient with Eliquis pharmacy free 30-day trial card. Spoke with [facility] Pharmacist who reports card has to be activated prior to use. However, card states no activation required and the telephone number listed on card is Mon-Friday. Made patient aware of what [facility] Pharmacist said. Copay will be 47 dollars for Eliquis without discount card. Provided patient with [facility] Referral telephone number to call to find PCP.  No further TOC needs assessed.     Final next level of care: Home/Self Care Barriers to Discharge: No Barriers Identified   Patient Goals and CMS Choice Patient states their goals for this hospitalization and ongoing recovery are:: return home      Discharge Placement                       Discharge Plan and Services                                     Social Determinants of Health (SDOH) Interventions     Readmission Risk Interventions No flowsheet data found.

## 2021-05-04 NOTE — Assessment & Plan Note (Addendum)
Total cholesterol 173, HDL 41, LDL 113, triglycerides 95.  Not on statin outpatient.  Started on Crestor 20 mg p.o. daily.

## 2021-05-04 NOTE — Assessment & Plan Note (Addendum)
Currently off antihypertensives outpatient.  Restarted on irbesartan 300 mg p.o. daily, CardizemCD 180mg  PO daily, spironolactone 25 mg p.o. daily.  Restarted on statin, Crestor 20 mg p.o. daily.

## 2021-05-04 NOTE — Progress Notes (Signed)
Progress Note  Patient Name: Lindsay Gomez Date of Encounter: 05/07/2021  Attending physician: No att. providers found Primary care provider: Pcp, No Primary Cardiologist: Rex Kras, DO, FACC  Subjective: Lindsay Gomez is a 51 y.o. female who was seen and examined at bedside  No CP or shortness of breath.  Wishes to go home.  Case discussed and reviewed with her nurse.  Objective:  Telemetry: Personally reviewed. NSR   Physical examination: PHYSICAL EXAM: Vitals with BMI 05/04/2021 05/04/2021 05/04/2021  Height - - -  Weight - - -  BMI - - -  Systolic 161 096 045  Diastolic 93 82 89  Pulse - 78 79    CONSTITUTIONAL: Appears older than stated age, hemodynamically stable, no acute distress. SKIN: Skin is warm and dry. No rash noted. No cyanosis. No pallor. No jaundice HEAD: Normocephalic and atraumatic.  EYES: No scleral icterus MOUTH/THROAT: Moist oral membranes.  NECK: No JVD present. No thyromegaly noted. No carotid bruits  CHEST Normal respiratory effort. No intercostal retractions  LUNGS: Clear to auscultation bilaterally.  No stridor. No wheezes. No rales. CARDIOVASCULAR: Regular rate and rhythm, positive S1-S2, no murmurs rubs or gallops appreciated. ABDOMINAL: Obese, soft, nontender, nondistended, positive bowel sounds in all 4 quadrants, no apparent ascites.  EXTREMITIES: No pitting edema, warm to touch, +2 bilateral DP and PT pulses HEMATOLOGIC: No significant bruising NEUROLOGIC: Oriented to person, place, and time. Nonfocal. Normal muscle tone.  PSYCHIATRIC: Normal mood and affect. Normal behavior. Cooperative  Lab Results: Hematology Recent Labs  Lab 05/03/21 0200 05/04/21 0356  WBC 6.1 6.1  RBC 5.45* 5.12*  HGB 14.6 14.3  HCT 46.5* 43.0  MCV 85.3 84.0  MCH 26.8 27.9  MCHC 31.4 33.3  RDW 14.5 14.2  PLT 279 232    Chemistry Recent Labs  Lab 05/03/21 0200 05/04/21 0356  NA 139 139  K 3.6 3.4*  CL 104 105  CO2 24 24  GLUCOSE 237*  171*  BUN 10 7  CREATININE 0.74 0.66  CALCIUM 8.9 8.4*  GFRNONAA >60 >60  ANIONGAP 11 10     Cardiac Enzymes: Cardiac Panel (last 3 results) No results for input(s): CKTOTAL, CKMB, TROPONINIHS, RELINDX in the last 72 hours.  BNP (last 3 results) Recent Labs    08/11/20 1851 05/03/21 1528  BNP 90.0 123.8*    ProBNP (last 3 results) No results for input(s): PROBNP in the last 8760 hours.   DDimer No results for input(s): DDIMER in the last 168 hours.   Hemoglobin A1c:  Lab Results  Component Value Date   HGBA1C 7.3 (H) 05/03/2021   MPG 162.81 05/03/2021    TSH  Recent Labs    05/03/21 1121  TSH 1.092    Lipid Panel     Component Value Date/Time   CHOL 173 05/03/2021 1824   TRIG 95 05/03/2021 1824   HDL 41 05/03/2021 1824   CHOLHDL 4.2 05/03/2021 1824   VLDL 19 05/03/2021 1824   LDLCALC 113 (H) 05/03/2021 1824   LDLDIRECT 120.2 (H) 05/04/2021 0356    Imaging: No results found.  CARDIAC DATABASE: EKG: 05/03/2021: A-fib with RVR, 155 bpm, normal axis, nonspecific ST-T changes likely due to LVH, rate related, however ischemia cannot be ruled out.   05/03/2021: Atrial fibrillation with rapid ventricular rate, 165 bpm, NSVT, without underlying injury pattern.   05/03/2021: Atrial fibrillation with rapid ventricular rate, 123 bpm, nonspecific ST-T changes.   218/2023: Normal sinus rhythm, 80 bpm, normal axis, without underlying ischemia or  injury pattern.  Echocardiogram: 05/04/2021:  1. Left ventricular ejection fraction, by estimation, is 55 to 60%. The left ventricle has normal function. The left ventricle has no regional wall motion abnormalities. There is moderate left ventricular hypertrophy. Left ventricular diastolic parameters are consistent with Grade II diastolic dysfunction (pseudonormalization). Elevated left atrial pressure.   2. Right ventricular systolic function is normal. The right ventricular size is normal.   3. Left atrial size was mildly  dilated.   4. The mitral valve is normal in structure. Mild mitral valve regurgitation. No evidence of mitral stenosis.   5. The aortic valve is normal in structure. Aortic valve regurgitation is not visualized. No aortic stenosis is present.Compared to prior study 08/12/2020: Mildly dilated LA, Grade 2 diastolic dysfunction and elevated LAP are new findings otherwise, no significant change.  Stress test: None  Heart catheterization: None  Carotid duplex: 02/2017:  Right Carotid: There is evidence in the right ICA of a 1-39% stenosis.  Left Carotid: There is evidence in the left ICA of a 1-39% stenosis.  Vertebrals: Both vertebral arteries were patent with antegrade flow.    [place] LE Venous Duplex 09/08/2019:   RIGHT: No evidence of common femoral vein obstruction.  LEFT:  There is no evidence of deep vein thrombosis in the lower extremity.    CT Cardiac Scoring: 03/03/2021 Total CAC 85.8 AU, 98th percentile for patient's age, sex, and race.  IMPRESSION & RECOMMENDATIONS: Lindsay Gomez is a 51 y.o. African-American female whose past medical history and cardiac risk factors include: Benign essential hypertension, insulin-dependent diabetes mellitus type 2, hyperlipidemia, history of stroke, obesity due to excess calories.  Paroxysmal atrial fibrillation: Newly discovered February 2023 Presented as A-fib with RVR Converted to normal sinus rhythm with parenteral medications We will transition to Cardizem 180 mg p.o. daily CHA2DS2-VASc SCORE is 5 which correlates to 6.7% risk of stroke per year (HTN, DM, stroke, gender). Discussed the risks, benefits, and alternatives to oral anticoagulation.   Patient prefers Eliquis as long as it is not cost prohibitive. Educated on the importance of taking her medications regularly and not stopping them prematurely. TSH within normal limits.   UDS negative.   Echocardiogram: Preserved LVEF, grade 2 diastolic dysfunction, elevated LAP, no  significant valvular heart disease. Telemetry independently reviewed. Reemphasized the importance of being treated for sleep apnea. Encouraged the importance of healthy weight loss and consuming a heart healthy diet and increasing physical activity as tolerated with a goal of 30 minutes a day 5 days a week.  Long-term oral anticoagulation: Indication: Paroxysmal atrial fibrillation. Prior to starting oral anticoagulation baseline hemoglobin 14.6 g/dL Risks, benefits, and alternatives to oral anticoagulation discussed.  Her questions and concerns were addressed to her satisfaction.  Benign essential hypertension: Continue current medications.  Insulin-dependent diabetes mellitus type 2 with hyperglycemia: Hemoglobin A1c 7.3.  Management per primary team.  Mild coronary artery calcification: Continue statin therapy.  Discontinue aspirin as she will be on anticoagulation for her PAF.  History of noncompliance: This is her second hospitalization that has been triggered by stopping her medications abruptly.  She is strongly advised not to do this and if she has any questions or concerns should discuss it with her providers.  Patient's questions and concerns were addressed to her satisfaction. She voices understanding of the instructions provided during this encounter.   This note was created using a voice recognition software as a result there may be grammatical errors inadvertently enclosed that do not reflect the nature of this  encounter. Every attempt is made to correct such errors.  Outpatient follow-up scheduled for June 17, 2021.  Independently reviewed labs, echocardiogram images/report, discussed plan of care with attending physician with regards to discharge planning, organizing outpatient follow-up, discussing disease management, answering questions and concerns for the patient, coordination of care.  Total time spent: 35 minutes  Mechele Claude [facility]  Pager: [phone] Office:  [phone]

## 2021-05-04 NOTE — Assessment & Plan Note (Signed)
Hemoglobin A1c 7.3.  On Ozempic outpatient.

## 2021-05-04 NOTE — Hospital Course (Signed)
Lindsay Gomez is a 51 year old female with past medical history significant for essential hypertension, type 2 diabetes mellitus, HLD, OSA not on CPAP, morbid obesity, history of medical noncompliance who presents to [facility] ED on 2/18 with chest pain and shortness of breath.  Patient reports a fluttering sensation in her chest with pain described as dull.  In the ED, temperature 90.2 F, HR 151, RR 20, BP 120s over 104, SPO2 99% on room air.  WBC 6.1, hemoglobin 14.6, platelets 279.  Sodium 139, potassium 3.6, chloride 104, CO2 24, BUN 10, creatinine 0.74, glucose 237.  Magnesium 1.8.  BNP 123.8.  TSH 1.092.  hCG negative.  UDS negative.  Influenza A/B PCR negative.  COVID-19 PCR negative.  Chest x-ray with cardiomegaly with pulmonary vascular congestion.  Cardio was consulted.  Patient was started on a diltiazem drip for new onset A-fib with RVR.  TRH consulted for further evaluation and management of chest pain, A-fib with RVR.

## 2021-05-04 NOTE — Progress Notes (Signed)
*  PRELIMINARY RESULTS* Echocardiogram 2D Echocardiogram has been performed.  Lindsay Gomez 05/04/2021, 1:06 PM

## 2021-06-12 ENCOUNTER — Ambulatory Visit: Payer: BC Managed Care – PPO | Admitting: Internal Medicine

## 2021-06-12 ENCOUNTER — Encounter: Payer: Self-pay | Admitting: Internal Medicine

## 2021-06-12 VITALS — BP 144/100 | HR 84 | Ht 62.0 in | Wt 281.4 lb

## 2021-06-12 DIAGNOSIS — Z1211 Encounter for screening for malignant neoplasm of colon: Secondary | ICD-10-CM | POA: Diagnosis not present

## 2021-06-12 DIAGNOSIS — I152 Hypertension secondary to endocrine disorders: Secondary | ICD-10-CM | POA: Insufficient documentation

## 2021-06-12 DIAGNOSIS — E1159 Type 2 diabetes mellitus with other circulatory complications: Secondary | ICD-10-CM

## 2021-06-12 DIAGNOSIS — E1142 Type 2 diabetes mellitus with diabetic polyneuropathy: Secondary | ICD-10-CM

## 2021-06-12 DIAGNOSIS — I10 Essential (primary) hypertension: Secondary | ICD-10-CM | POA: Diagnosis not present

## 2021-06-12 MED ORDER — DAPAGLIFLOZIN PROPANEDIOL 10 MG PO TABS: 10.0000 mg | ORAL_TABLET | Freq: Every day | ORAL | 6 refills | Status: DC

## 2021-06-12 NOTE — Progress Notes (Signed)
? ?Name: Lindsay Gomez  ?Age/ Sex: 51 y.o., female   ?MRN/ DOB: [id number], 1970-10-02    ? ?PCP: Pcp, No   ?Reason for Endocrinology Evaluation: Type 2 Diabetes Mellitus  ?Initial Endocrine Consultative Visit: 01/07/18  ? ? ?PATIENT IDENTIFIER: Lindsay Gomez is a 51 y.o. female with a past medical history of HTN, Obesity,CVA, OSA not on treatment and T2DM . The patient has followed with Endocrinology clinic since 01/07/18 for consultative assistance with management of her diabetes. ? ?DIABETIC HISTORY:  ?Ms. Schow was diagnosed with T2DM in 2016,She is intolerant to Metformin.On her initial visit to our clinic she was on Basaglar, glipizide and Trulicity, she did admit to noncompliance with her meds, she would take basaglar once a month. Her hemoglobin A1c has ranged from  6.3% in 2014, peaking at 14.1% in 2018 ? ?Pt with hx of recurrent yeast infections ?Lives with boyfriend who has T1DM and on dialysis  ?SUBJECTIVE:  ? ?During the last visit (02/12/2021): A1c 10 %/ continued Ozempic, she was lost to follow up for ~ 2 yrs and has been off insulin  ? ? ? ? ? ?Today (06/12/2021): [name] is here for a follow up on diabetes management.  ?She has been using the [employer] but did not have her receiver for the download  ? ? ? ?She was seen by cardiology 12/2020 for HTN and dyspnea on exertion , she had stopped her antihypertensive as it makes her feel nausea and headaches  ? ? ?She has abdominal pain each time she eats a full time , but no diarrhea  ? ? ? ?HOME DIABETES REGIMEN:  ?Ozempic 1 mg weekly ? ? ? ? ?CONTINUOUS GLUCOSE MONITORING RECORD INTERPRETATION: Did not bring the receiver ? ? ? ? ?DIABETIC COMPLICATIONS: ?Microvascular complications:  ?Neuropathy ?Denies:  Retinopathy, nephropathy ?Last eye exam: Completed 2021 ?  ?Macrovascular complications:  ?CVA ([id number]) ?Denies: CAD, PVD ?  ? ? ? ? ?HISTORY:  ?Past Medical History:  ?Past Medical History:  ?Diagnosis Date  ? Allergy   ?  rhinitis  ? Anemia   ? Anxiety   ? Atrial fibrillation ([place])   ? Diabetes mellitus without complication ([place])   ? glucose usually 140-150  ? Enlarged heart   ? Hyperlipidemia   ? Hypertension   ? Morbid obesity ([place])   ? Shortness of breath dyspnea   ? with low iron  ? ?Past Surgical History:  ?Past Surgical History:  ?Procedure Laterality Date  ? ABDOMINAL HYSTERECTOMY N/A 01/29/2015  ? Procedure: TOTAL ABDOMINAL HYSTERECTOMY ;  Surgeon: Ena Dawley, MD;  Location: [facility];  Service: Gynecology;  Laterality: N/A;  ? BILATERAL SALPINGECTOMY Bilateral 01/29/2015  ? Procedure: BILATERAL SALPINGECTOMY;  Surgeon: Ena Dawley, MD;  Location: [facility];  Service: Gynecology;  Laterality: Bilateral;  ? CESAREAN SECTION    ? [place]  ? ? arthroscopic/ right  ? ?Social History:  reports that she has never smoked. She has never used smokeless tobacco. She reports that she does not drink alcohol and does not use drugs. ?Family History:  ?Family History  ?Problem Relation Age of Onset  ? Hypertension Mother   ? Diabetes Mother   ? Coronary artery disease Father 17  ? Heart failure Father   ? Heart attack Father   ? Diabetes Father   ? Stroke Brother 42  ? Cancer Paternal Grandmother   ?     unknown type?  ? Hypertension Other   ? Hyperlipidemia  Other   ? Esophageal cancer Neg Hx   ? Colon cancer Neg Hx   ? Pancreatic cancer Neg Hx   ? Stomach cancer Neg Hx   ? Atrial fibrillation Neg Hx   ? ? ? ?HOME MEDICATIONS: ?Allergies as of 06/12/2021   ? ?   Reactions  ? Dulaglutide Other (See Comments)  ? Lisinopril Other (See Comments)  ? Cough   ? Metformin Hcl Other (See Comments)  ? ?  ? ?  ?Medication List  ?  ? ?  ? Accurate as of June 12, 2021 11:54 AM. If you have any questions, ask your nurse or doctor.  ?  ?  ? ?  ? ?apixaban 5 MG Tabs tablet ?Commonly known as: ELIQUIS ?Take 1 tablet (5 mg total) by mouth 2 (two) times daily. ?  ?bismuth subsalicylate 622 MG chewable tablet ?Commonly known as: PEPTO  BISMOL ?Chew 524 mg by mouth as needed for indigestion or diarrhea or loose stools. ?  ?diltiazem 180 MG 24 hr capsule ?Commonly known as: Cardizem CD ?Take 1 capsule (180 mg total) by mouth daily. ?  ?irbesartan 300 MG tablet ?Commonly known as: AVAPRO ?Take 1 tablet (300 mg total) by mouth daily. ?  ?Ozempic (1 MG/DOSE) 4 MG/3ML Sopn ?Generic drug: Semaglutide (1 MG/DOSE) ?Inject 1 mg into the skin once a week. ?What changed: when to take this ?  ?potassium chloride SA 20 MEQ tablet ?Commonly known as: KLOR-CON M ?Take 1 tablet (20 mEq total) by mouth daily. ?  ?rosuvastatin 20 MG tablet ?Commonly known as: CRESTOR ?Take 1 tablet (20 mg total) by mouth at bedtime. ?  ?spironolactone 25 MG tablet ?Commonly known as: Aldactone ?Take 1 tablet (25 mg total) by mouth daily. ?  ? ?  ? ? ? ?OBJECTIVE:  ? ?Vital Signs: BP (!) 158/116   Pulse 84   Ht '5\' 2"'$  (1.575 m)   Wt 281 lb 6.4 oz (127.6 kg)   LMP 12/18/2014 (Exact Date)   SpO2 97%   BMI 51.47 kg/m?   ?Wt Readings from Last 3 Encounters:  ?06/12/21 281 lb 6.4 oz (127.6 kg)  ?05/03/21 281 lb 3.2 oz (127.6 kg)  ?02/12/21 284 lb (128.8 kg)  ? ? ? ?Exam: ?General: Pt appears well and is in NAD  ?Lungs: Clear with good BS bilat with no rales, rhonchi, or wheezes  ?Heart: RRR with normal S1 and S2   ?Abdomen: Normoactive bowel sounds, soft, nontender, without masses or organomegaly palpable  ?Extremities: Trace pretibial edema.    ?Neuro: MS is good with appropriate affect, pt is alert and Ox3  ? ? ? ?ASSESSMENT / PLAN / RECOMMENDATIONS:  ? ?1) Type 2 Diabetes Mellitus, with improving glycemic control, With neuropathic  and macrovascular complications - Most recent A1c of 7.3 %. Goal A1c < 7.0 %.   ? ? ?-I have praised the patient on improved glycemic control and encouraged her to continue with lifestyle changes ?-She is having abdominal pain each time she eats, we did discuss this could be related to Ozempic, I did offer to either reduce the dose or take a break for a  month and assess if the abdominal pain resolves ?-She will be on Farxiga while of the Ozempic ? ?MEDICATIONS: ? ? Continue Ozempic 1 mg weekly for now ?Start Farxiga 10 mg daily while of Ozempic ? ?EDUCATION / INSTRUCTIONS: ?BG monitoring instructions: Patient is instructed to check her blood sugars 3 times a day, before meals and bedtime. ?Call [facility] if: BG persistently < 70 I reviewed the Rule of 15 for the treatment of hypoglycemia in detail with the patient. Literature supplied. ? ?2) Diabetic complications:  ?Eye:She does not have known diabetic retinopathy. Pt urged to schedule an eye exam  ?Neuro/ Feet: Does have known diabetic peripheral neuropathy. ?Renal: Patient does not have known baseline CKD. She is on on an ACEI/ARB at present. ? ? ?3.)  Abdominal pain: ? ?-I do suspect this is related to Ozempic ?-The only way for her to know is to hold off on Ozempic for approximately a month, but to prevent hypoglycemia she will be on Farxiga ? ? ?4.)  Colon cancer screening ? ?-Patient requesting referral for colonoscopy as she is due for colon cancer screening ? ? ?5.) HTN: ? ?-Her BP is out of control, she is not taking Avapro, spironolactone, and the Cardizem because it makes her feel bad. ?-I am wondering if she needs a gradual decrease in her blood pressure and taking all 3 together was low and too much at once ?-Initial BP was 158/116, repeat was 140/100 mmHg ?-In the meantime I have advised her to take half a tablet of Avapro daily until she sees cardiology next week ? ? ?Medication ?Avapro 300 mg, half a tablet daily ? ? ?Follow-up in 6 months ? ?Signed electronically by: ?Abby Nena Jordan, MD ? ?[facility]  ?[facility] ?[address] ?[place], [place] [address] ?Phone: [phone] ?FAX: [phone] ? ? ?CC: ?Pcp, No ?No address on file ?Phone: None  ?Fax: None ? ?Return to Endocrinology clinic as below: ?Future Appointments  ?Date Time  Provider [place]  ?06/17/2021  3:30 PM Rex Kras, DO [facility] None  ?  ? ?  ? ?

## 2021-06-12 NOTE — Patient Instructions (Addendum)
?-   Continue Ozempic 1 mg weekly , once done with current prescription, stop the Ozempic for a month and start Farxiga 1 tablet daily and see if this helps with your stomach issues  ? ? ?Take half a tablet of Avapro ( Ibersartan ) daily  ?

## 2021-06-17 ENCOUNTER — Ambulatory Visit: Payer: BC Managed Care – PPO | Admitting: Student

## 2021-06-25 IMAGING — CR DG CHEST 2V
2 series · 2 of 2 positions shown · non-contrast
Comparison: June 01, 2020

CLINICAL DATA: Shortness of breath.

EXAM:
CHEST - 2 VIEW

[chest pa]
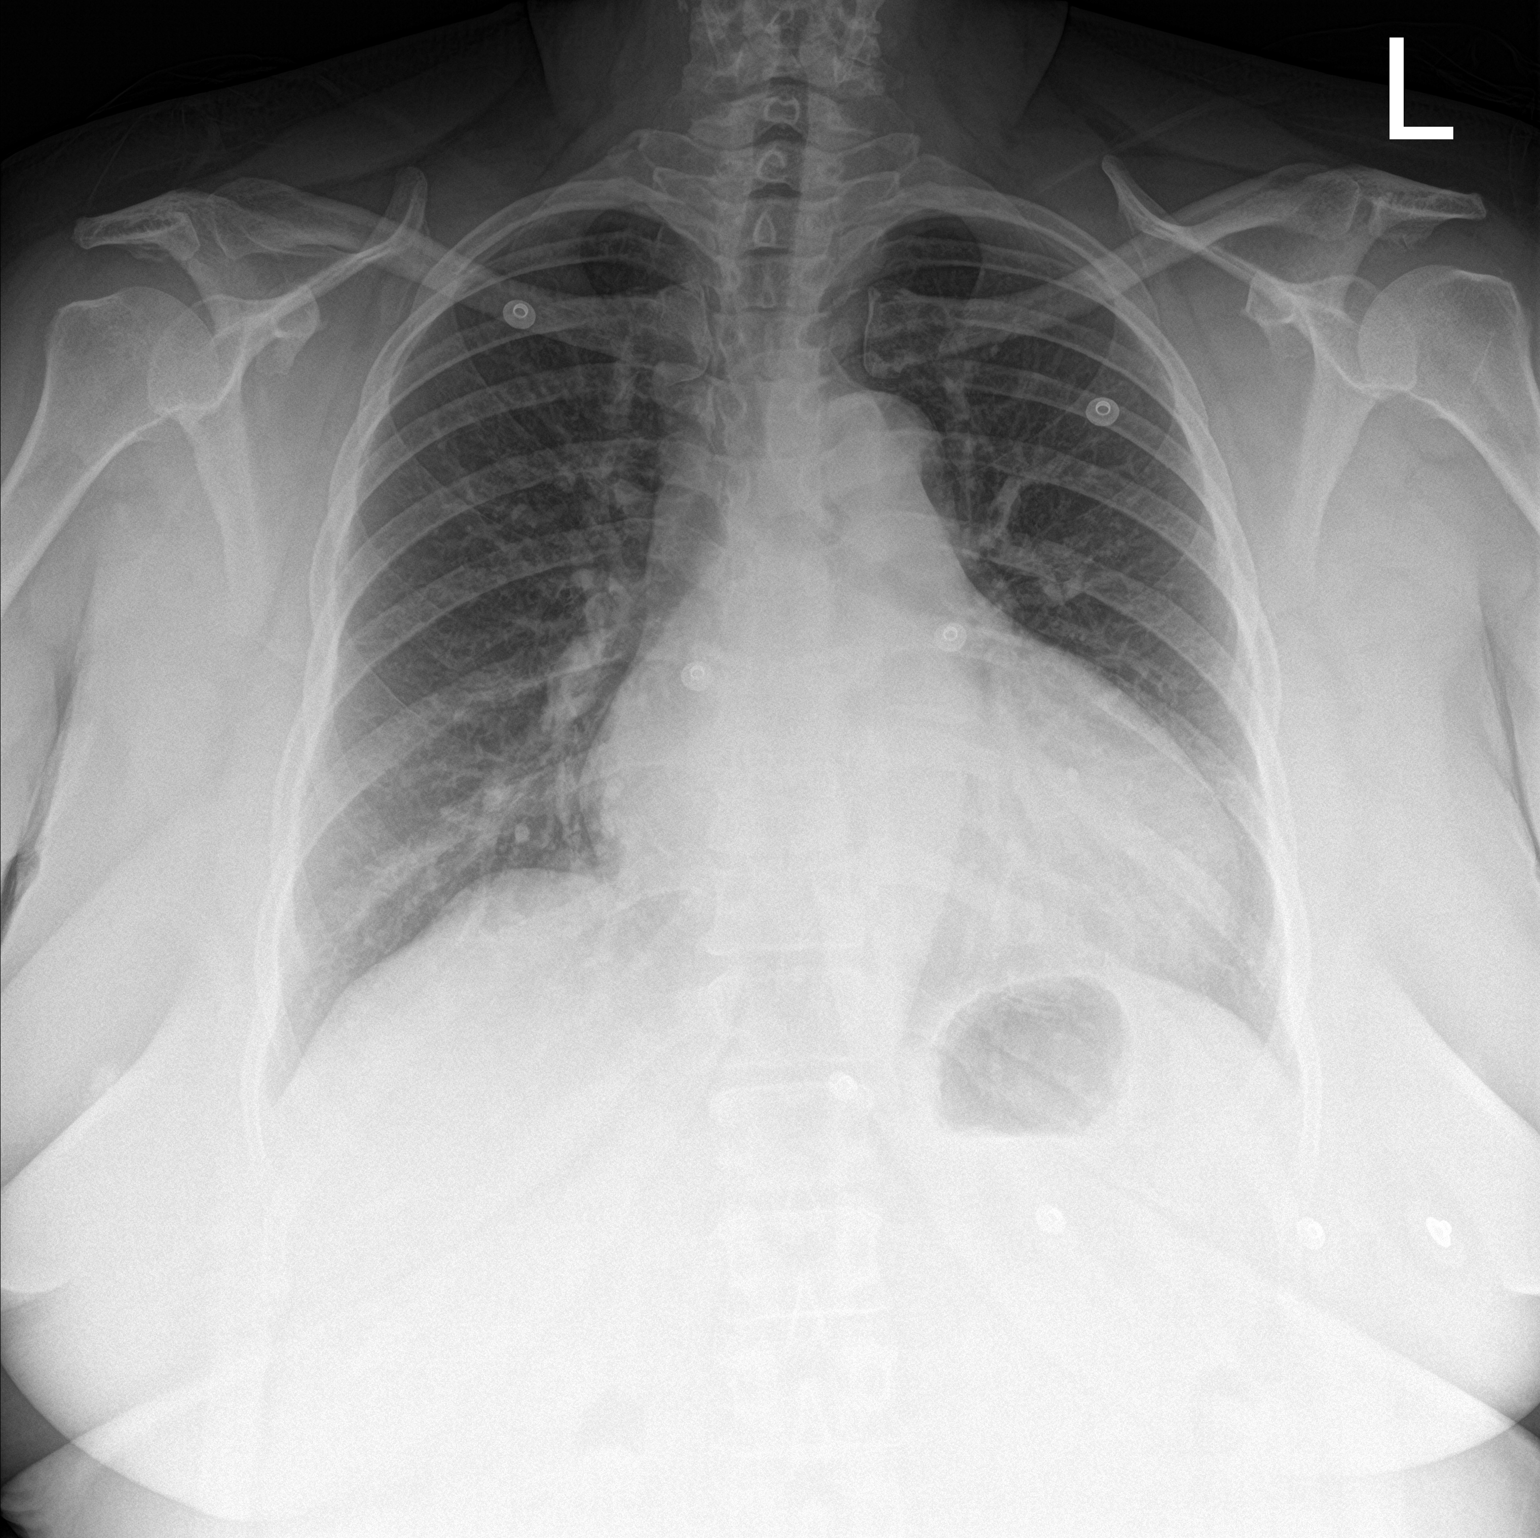

[chest lat]
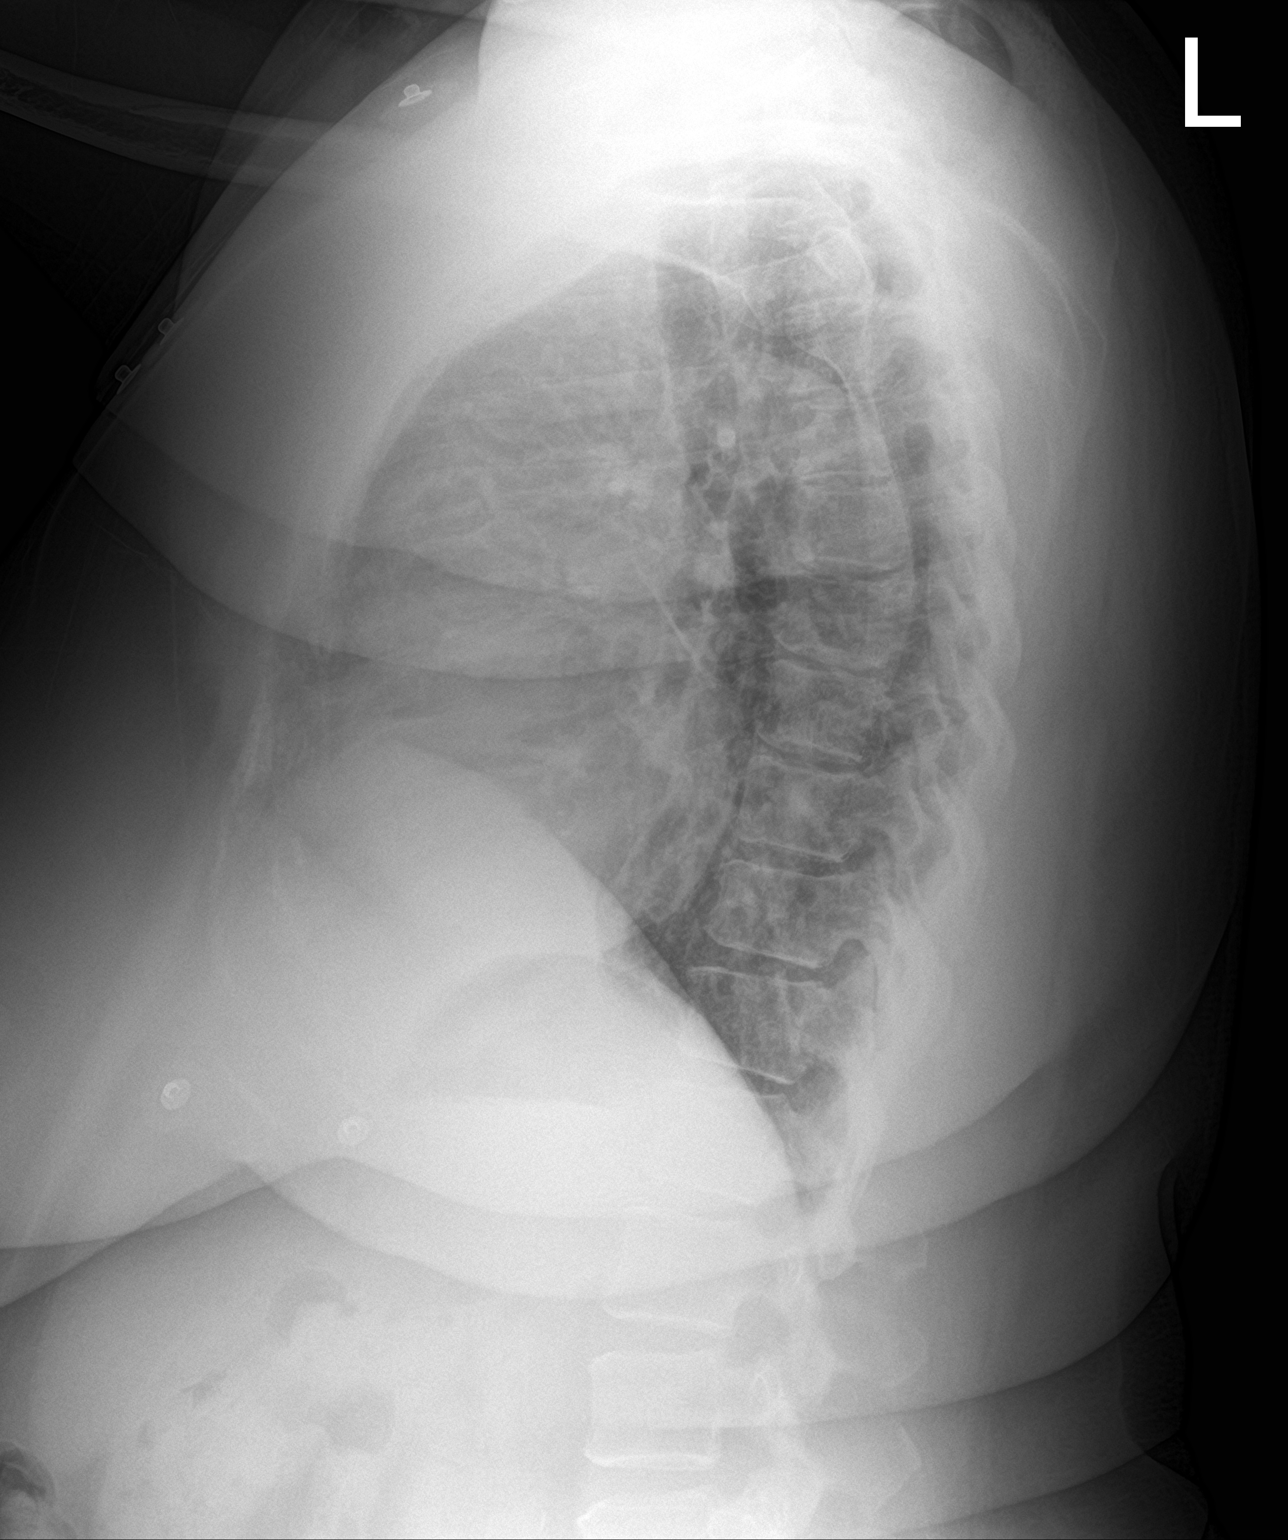

[2 of 2 positions shown; findings below may reference images not displayed]

FINDINGS: Enlarged cardiac silhouette.

There is no evidence of focal airspace consolidation, pleural
effusion or pneumothorax. Mild interstitial pulmonary edema.

Osseous structures are without acute abnormality. Soft tissues are
grossly normal.
IMPRESSION: 1. Enlarged cardiac silhouette, similar to the prior exam.
2. Mild interstitial pulmonary edema.

## 2021-06-26 IMAGING — DX DG CHEST 1V PORT
1 series · 1 of 1 positions shown · non-contrast
Comparison: Yesterday

CLINICAL DATA: Shortness of breath

EXAM:
PORTABLE CHEST 1 VIEW

[chest ap]
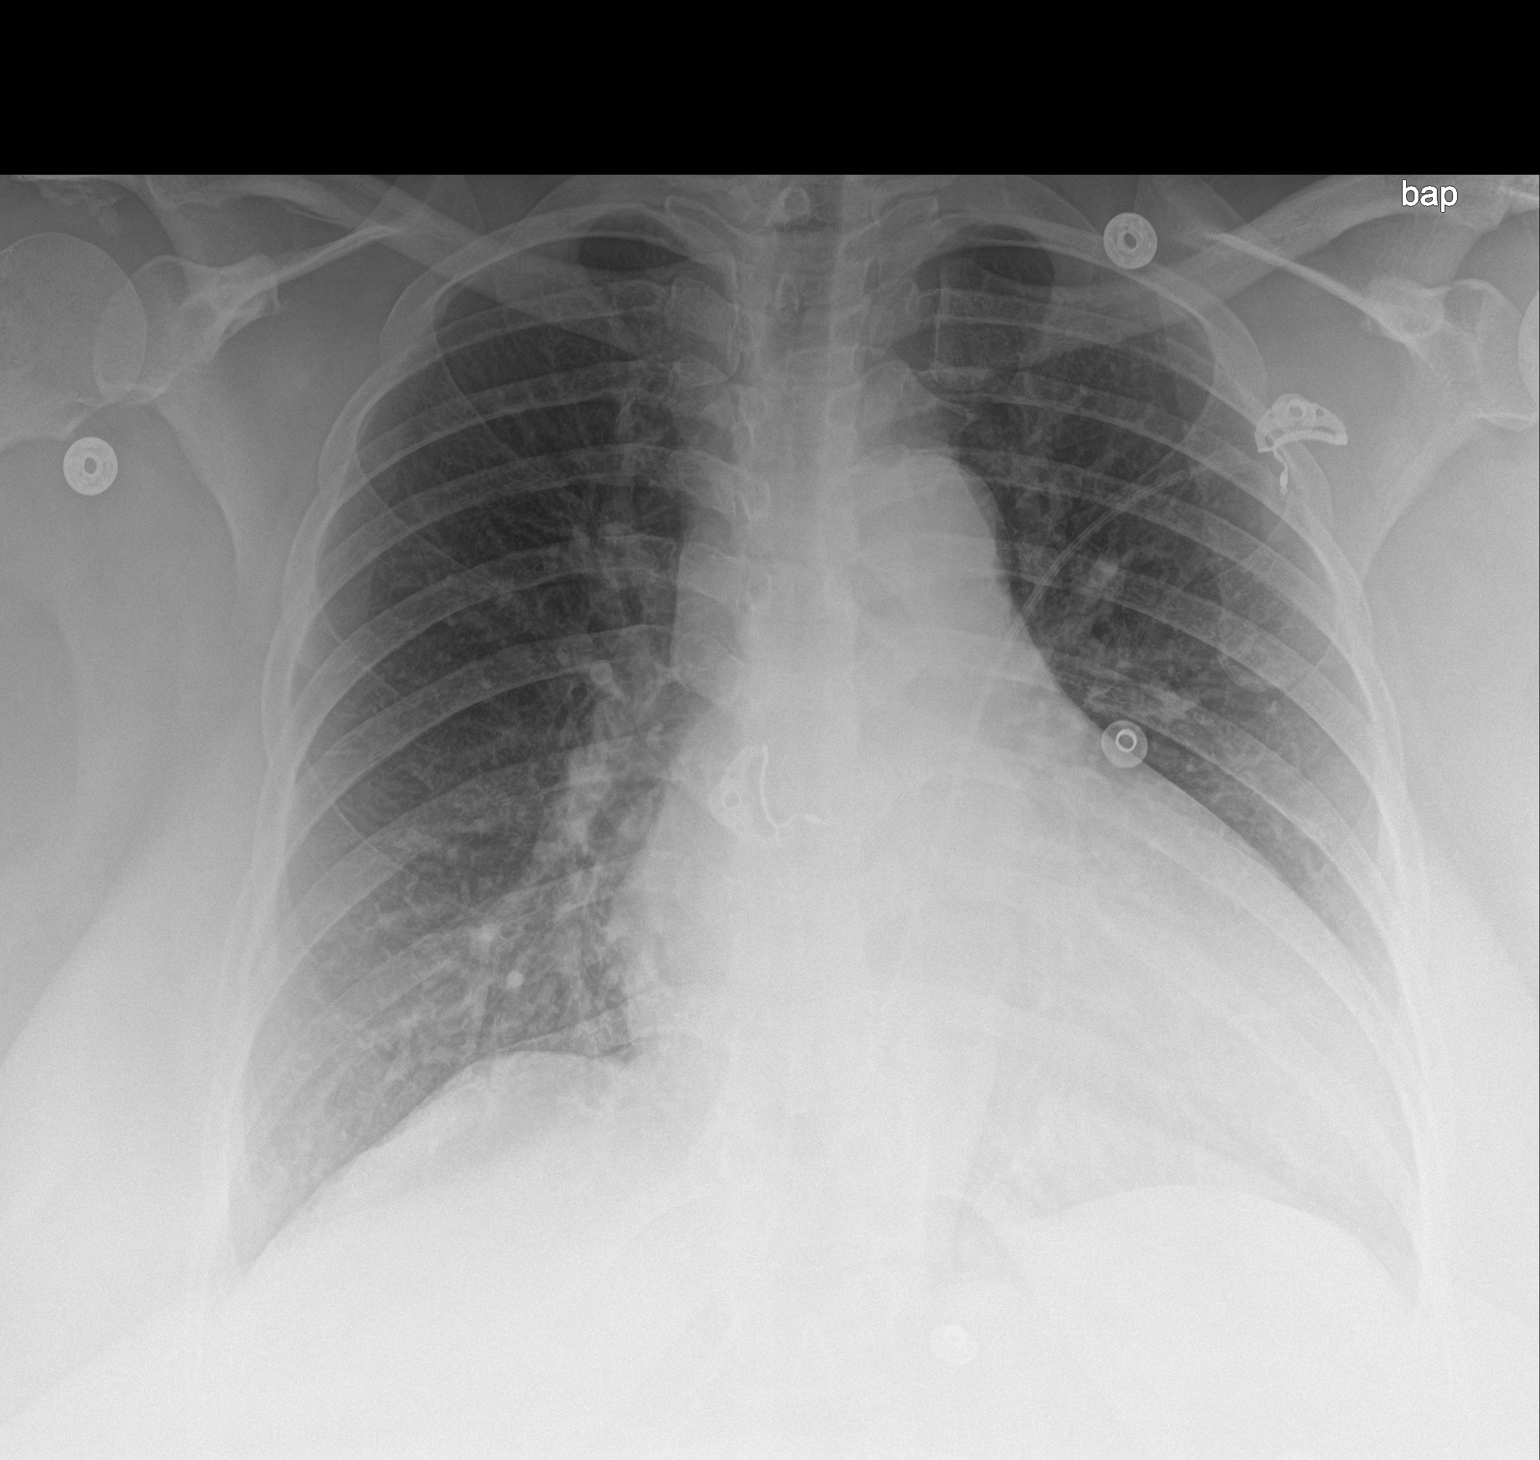

[1 of 1 positions shown; findings below may reference images not displayed]

FINDINGS: Cardiomegaly. Stable aortic and hilar contours. There is no edema,
consolidation, effusion, or pneumothorax. No acute osseous finding.
IMPRESSION: No acute finding.

Cardiomegaly.

## 2021-12-16 NOTE — Progress Notes (Unsigned)
Name: Lindsay Gomez  Age/ Sex: 51 y.o., female   MRN/ DOB: [id number], 02-19-1971     PCP: Pcp, No   Reason for Endocrinology Evaluation: Type 2 Diabetes Mellitus  Initial Endocrine Consultative Visit: 01/07/18    PATIENT IDENTIFIER: Lindsay Gomez is a 51 y.o. female with a past medical history of HTN, Obesity,CVA, OSA not on treatment and T2DM . The patient has followed with Endocrinology clinic since 01/07/18 for consultative assistance with management of her diabetes.  DIABETIC HISTORY:  Lindsay Gomez was diagnosed with T2DM in 2016,She is intolerant to Metformin.On her initial visit to our clinic she was on Basaglar, glipizide and Trulicity, she did admit to noncompliance with her meds, she would take basaglar once a month. Her hemoglobin A1c has ranged from  6.3% in 2014, peaking at 14.1% in 2018     SUBJECTIVE:   During the last visit (06/12/2021): A1c 7.3 %     Today (12/17/2021): Ms. Dula is here for a follow up on diabetes management.  She has been using the [facility] , she has ran out of the sensors     Abdominal pain has resolved with dietary changes while on Ozempic, she did not have to try [place]:  Ozempic 1 mg weekly ( Sunday)      CONTINUOUS GLUCOSE MONITORING RECORD INTERPRETATION: n/a    DIABETIC COMPLICATIONS: Microvascular complications:  Neuropathy Denies:  Retinopathy, nephropathy Last eye exam: Completed 2021     Macrovascular complications:  CVA (57/8469) Denies: CAD, PVD       HISTORY:  Past Medical History:  Past Medical History:  Diagnosis Date   Allergy    rhinitis   Anemia    Anxiety    Atrial fibrillation ([place])    Diabetes mellitus without complication (Lindsay)    glucose usually 140-150   Enlarged heart    Hyperlipidemia    Hypertension    Morbid obesity (Lindsay Gomez)    Shortness of breath dyspnea    with low iron   Past Surgical History:  Past Surgical History:  Procedure  Laterality Date   ABDOMINAL HYSTERECTOMY N/A 01/29/2015   Procedure: TOTAL ABDOMINAL HYSTERECTOMY ;  Surgeon: Ena Dawley, MD;  Location: [facility];  Service: Gynecology;  Laterality: N/A;   BILATERAL SALPINGECTOMY Bilateral 01/29/2015   Procedure: BILATERAL SALPINGECTOMY;  Surgeon: Ena Dawley, MD;  Location: [facility];  Service: Gynecology;  Laterality: Bilateral;   [place]   ? arthroscopic/ right   Social History:  reports that she has never smoked. She has never used smokeless tobacco. She reports that she does not drink alcohol and does not use drugs. Family History:  Family History  Problem Relation Age of Onset   Hypertension Mother    Diabetes Mother    Coronary artery disease Father 32   Heart failure Father    Heart attack Father    Diabetes Father    Stroke Brother 2   Cancer Paternal Grandmother        unknown type?   Hypertension Other    Hyperlipidemia Other    Esophageal cancer Neg Hx    Colon cancer Neg Hx    Pancreatic cancer Neg Hx    Stomach cancer Neg Hx    Atrial fibrillation Neg Hx      HOME MEDICATIONS: Allergies as of 12/17/2021       Reactions   Dulaglutide Other (See Comments)  Lisinopril Other (See Comments)   Cough    Metformin Hcl Other (See Comments)        Medication List        Accurate as of December 17, 2021  3:36 PM. If you have any questions, ask your nurse or doctor.          STOP taking these medications    dapagliflozin propanediol 10 MG Tabs tablet Commonly known as: [place] Stopped by: Dorita Sciara, MD       TAKE these medications    apixaban 5 MG Tabs tablet Commonly known as: ELIQUIS Take 1 tablet (5 mg total) by mouth 2 (two) times daily.   bismuth subsalicylate 892 MG chewable tablet Commonly known as: PEPTO BISMOL Chew 524 mg by mouth as needed for indigestion or diarrhea or loose stools.   diltiazem 180 MG 24 hr capsule Commonly known as: Cardizem CD Take  1 capsule (180 mg total) by mouth daily.   FreeStyle Libre 2 Sensor Misc 1 Device by Does not apply route every 14 (fourteen) days. Started by: Dorita Sciara, MD   irbesartan 300 MG tablet Commonly known as: AVAPRO Take 1 tablet (300 mg total) by mouth daily.   Ozempic (1 MG/DOSE) 4 MG/3ML Sopn Generic drug: Semaglutide (1 MG/DOSE) Inject 1 mg into the skin once a week. What changed: when to take this   potassium chloride SA 20 MEQ tablet Commonly known as: KLOR-CON M Take 1 tablet (20 mEq total) by mouth daily.   rosuvastatin 20 MG tablet Commonly known as: CRESTOR Take 1 tablet (20 mg total) by mouth at bedtime.   spironolactone 25 MG tablet Commonly known as: Aldactone Take 1 tablet (25 mg total) by mouth daily.         OBJECTIVE:   Vital Signs: BP 138/86 (BP Location: Left Arm, Patient Position: Sitting, Cuff Size: Large)   Pulse 89   Ht '5\' 2"'$  (1.575 m)   Wt 280 lb (127 kg)   LMP 12/18/2014 (Exact Date)   SpO2 96%   BMI 51.21 kg/m   Wt Readings from Last 3 Encounters:  12/17/21 280 lb (127 kg)  06/12/21 281 lb 6.4 oz (127.6 kg)  05/03/21 281 lb 3.2 oz (127.6 kg)     Exam: General: Pt appears well and is in NAD  Lungs: Clear with good BS bilat with no rales, rhonchi, or wheezes  Heart: RRR with normal S1 and S2   Abdomen: Normoactive bowel sounds, soft, nontender, without masses or organomegaly palpable  Extremities: Trace pretibial edema.    Neuro: MS is good with appropriate affect, pt is alert and Ox3   DM foot exam 12/17/2021 The skin of the feet is intact without sores or ulcerations. The pedal pulses are 2+ on right and 2+ on left. The sensation is intact to a screening 5.07, 10 gram monofilament bilaterally   ASSESSMENT / PLAN / RECOMMENDATIONS:   1) Type 2 Diabetes Mellitus, Poorly Controlled , With neuropathic  and macrovascular complications - Most recent A1c of 9.4 %. Goal A1c < 7.0 %.     -Unfortunately patient with hypoglycemia  again, A1c increased from 7.3% to 9.4%, she does admit to dietary indiscretions -I have emphasized the importance of checking glucose, she was provided with freestyle libre sensor sample and a new prescription -She declined any add-on therapy at this time and would like to focus on lifestyle changes again    MEDICATIONS:   Continue Ozempic 1 mg weekly for now  EDUCATION / INSTRUCTIONS: BG monitoring instructions: Patient is instructed to check her blood sugars 3 times a day, before meals  2) Diabetic complications:  Eye:She does not have known diabetic retinopathy. Pt urged to schedule an eye exam again Neuro/ Feet: Does have known diabetic peripheral neuropathy. Renal: Patient does not have known baseline CKD. She is on on an ACEI/ARB at present.      Follow-up in 6 months  Signed electronically by: Mack Guise, MD  [facility]  [facility] [address] [place], [place] [address] Phone: [phone] FAX: [phone]   CC: Pcp, No No address on file Phone: None  Fax: None  Return to Endocrinology clinic as below: No future appointments.

## 2021-12-17 ENCOUNTER — Encounter: Payer: Self-pay | Admitting: Internal Medicine

## 2021-12-17 ENCOUNTER — Ambulatory Visit (INDEPENDENT_AMBULATORY_CARE_PROVIDER_SITE_OTHER): Payer: BC Managed Care – PPO | Admitting: Internal Medicine

## 2021-12-17 VITALS — BP 138/86 | HR 89 | Ht 62.0 in | Wt 280.0 lb

## 2021-12-17 DIAGNOSIS — E1142 Type 2 diabetes mellitus with diabetic polyneuropathy: Secondary | ICD-10-CM

## 2021-12-17 DIAGNOSIS — E1159 Type 2 diabetes mellitus with other circulatory complications: Secondary | ICD-10-CM | POA: Diagnosis not present

## 2021-12-17 DIAGNOSIS — E1165 Type 2 diabetes mellitus with hyperglycemia: Secondary | ICD-10-CM | POA: Diagnosis not present

## 2021-12-17 LAB — POCT GLUCOSE (DEVICE FOR HOME USE): POC Glucose: 201 mg/dl — AB (ref 70–99)

## 2021-12-17 LAB — POCT GLYCOSYLATED HEMOGLOBIN (HGB A1C): Hemoglobin A1C: 9.4 % — AB (ref 4.0–5.6)

## 2021-12-17 MED ORDER — OZEMPIC (1 MG/DOSE) 4 MG/3ML ~~LOC~~ SOPN: 1.0000 mg | PEN_INJECTOR | SUBCUTANEOUS | 3 refills | Status: DC

## 2021-12-17 MED ORDER — FREESTYLE LIBRE 2 SENSOR MISC: 1.0000 | 3 refills | Status: DC

## 2021-12-17 NOTE — Patient Instructions (Signed)
-   Continue Ozempic 1 mg weekly

## 2021-12-27 ENCOUNTER — Other Ambulatory Visit: Payer: Self-pay | Admitting: Cardiology

## 2022-03-13 ENCOUNTER — Encounter (HOSPITAL_BASED_OUTPATIENT_CLINIC_OR_DEPARTMENT_OTHER): Payer: Self-pay | Admitting: Emergency Medicine

## 2022-03-13 ENCOUNTER — Other Ambulatory Visit: Payer: Self-pay

## 2022-03-13 DIAGNOSIS — R059 Cough, unspecified: Secondary | ICD-10-CM | POA: Diagnosis present

## 2022-03-13 DIAGNOSIS — B349 Viral infection, unspecified: Secondary | ICD-10-CM | POA: Insufficient documentation

## 2022-03-13 DIAGNOSIS — Z7901 Long term (current) use of anticoagulants: Secondary | ICD-10-CM | POA: Insufficient documentation

## 2022-03-13 DIAGNOSIS — Z794 Long term (current) use of insulin: Secondary | ICD-10-CM | POA: Diagnosis not present

## 2022-03-13 DIAGNOSIS — E119 Type 2 diabetes mellitus without complications: Secondary | ICD-10-CM | POA: Diagnosis not present

## 2022-03-13 DIAGNOSIS — Z20822 Contact with and (suspected) exposure to covid-19: Secondary | ICD-10-CM | POA: Diagnosis not present

## 2022-03-13 DIAGNOSIS — Z79899 Other long term (current) drug therapy: Secondary | ICD-10-CM | POA: Diagnosis not present

## 2022-03-13 DIAGNOSIS — I10 Essential (primary) hypertension: Secondary | ICD-10-CM | POA: Diagnosis not present

## 2022-03-13 LAB — RESP PANEL BY RT-PCR (RSV, FLU A&B, COVID)  RVPGX2
Influenza A by PCR: NEGATIVE
Influenza B by PCR: NEGATIVE
Resp Syncytial Virus by PCR: NEGATIVE
SARS Coronavirus 2 by RT PCR: NEGATIVE

## 2022-03-13 LAB — GROUP A STREP BY PCR: Group A Strep by PCR: NOT DETECTED

## 2022-03-13 MED ORDER — ACETAMINOPHEN 325 MG PO TABS
650.0000 mg | ORAL_TABLET | Freq: Once | ORAL | Status: AC | PRN
  Administered 2022-03-13: 650 mg via ORAL
  Filled 2022-03-13: qty 2

## 2022-03-13 NOTE — ED Triage Notes (Signed)
Sore throat and cough started 3 days ago. Seen at [facility] yesterday but worse today with ear pain as well

## 2022-03-14 ENCOUNTER — Emergency Department (HOSPITAL_BASED_OUTPATIENT_CLINIC_OR_DEPARTMENT_OTHER)
Admission: EM | Admit: 2022-03-14 | Discharge: 2022-03-14 | Disposition: A | Discharge status: Home / Self Care | Payer: BC Managed Care – PPO | Attending: Emergency Medicine | Admitting: Emergency Medicine

## 2022-03-14 DIAGNOSIS — I10 Essential (primary) hypertension: Secondary | ICD-10-CM

## 2022-03-14 DIAGNOSIS — J029 Acute pharyngitis, unspecified: Secondary | ICD-10-CM

## 2022-03-14 DIAGNOSIS — B349 Viral infection, unspecified: Secondary | ICD-10-CM

## 2022-03-14 MED ORDER — DILTIAZEM HCL ER COATED BEADS 180 MG PO CP24: 180.0000 mg | ORAL_CAPSULE | Freq: Every day | ORAL | 2 refills | Status: DC

## 2022-03-14 MED ORDER — DEXAMETHASONE SODIUM PHOSPHATE 10 MG/ML IJ SOLN
10.0000 mg | Freq: Once | INTRAMUSCULAR | Status: AC
  Administered 2022-03-14: 10 mg via INTRAMUSCULAR
  Filled 2022-03-14: qty 1

## 2022-03-14 MED ORDER — KETOROLAC TROMETHAMINE 30 MG/ML IJ SOLN
30.0000 mg | Freq: Once | INTRAMUSCULAR | Status: AC
  Administered 2022-03-14: 30 mg via INTRAMUSCULAR
  Filled 2022-03-14: qty 1

## 2022-03-14 NOTE — ED Provider Notes (Signed)
[place] EMERGENCY DEPT Provider Note   CSN: [id number] Arrival date & time: 03/13/22  1959     History  Chief Complaint  Patient presents with   Sore Throat    Lindsay Gomez is a 51 y.o. female.  HPI     This 51 year old female who presents with upper respiratory symptoms.  Patient reports that she over the last 2 days have developed cough, sore throat, fever.  Reports temperature up to 100.4.  She states she has used multiple over-the-counter remedies at home including Chloraseptic lozenges, salt water gargles without relief of her sore throat.  Also reports ear pain.  No chest pain or shortness of breath.  Home Medications Prior to Admission medications   Medication Sig Start Date End Date Taking? Authorizing Provider  apixaban (ELIQUIS) 5 MG TABS tablet Take 1 tablet (5 mg total) by mouth 2 (two) times daily. 05/04/21 08/02/21  [place] Donnamarie Poag, DO  bismuth subsalicylate (PEPTO BISMOL) 262 MG chewable tablet Chew 524 mg by mouth as needed for indigestion or diarrhea or loose stools.    [provider]  Continuous Blood Gluc Sensor (FREESTYLE LIBRE 2 SENSOR) MISC 1 Device by Does not apply route every 14 (fourteen) days. 12/17/21   Shamleffer, Melanie Crazier, MD  diltiazem (CARDIZEM CD) 180 MG 24 hr capsule Take 1 capsule (180 mg total) by mouth daily. 03/14/22 06/12/22  Jeaninne Lodico, Barbette Hair, MD  irbesartan (AVAPRO) 300 MG tablet Take 1 tablet (300 mg total) by mouth daily. 05/04/21 08/02/21  [place] Eric J, DO  OZEMPIC, 1 MG/DOSE, 4 MG/3ML SOPN Inject 1 mg into the skin once a week. 12/17/21   Shamleffer, Melanie Crazier, MD  potassium chloride SA (KLOR-CON M) 20 MEQ tablet Take 1 tablet (20 mEq total) by mouth daily. 05/04/21 08/02/21  [place] Donnamarie Poag, DO  rosuvastatin (CRESTOR) 20 MG tablet Take 1 tablet (20 mg total) by mouth at bedtime. 05/04/21 08/02/21  [place] Eric J, DO  spironolactone (ALDACTONE) 25 MG tablet Take 1 tablet (25 mg total) by mouth  daily. 05/04/21 08/02/21  [place] Eric J, DO      Allergies    Dulaglutide, Lisinopril, and Metformin hcl    Review of Systems   Review of Systems  Constitutional:  Positive for fever.  HENT:  Positive for congestion, ear pain and sore throat.   Respiratory:  Positive for cough.   All other systems reviewed and are negative.   Physical Exam Updated Vital Signs BP (!) 192/106   Pulse 89   Temp 98.5 F (36.9 C) (Oral)   Resp 18   LMP 12/18/2014 (Exact Date)   SpO2 100%  Physical Exam Vitals and nursing note reviewed.  Constitutional:      Appearance: She is well-developed. She is obese. She is not ill-appearing.  HENT:     Head: Normocephalic and atraumatic.     Right Ear: Tympanic membrane normal.     Left Ear: Tympanic membrane normal.     Nose: Congestion present.     Mouth/Throat:     Mouth: Mucous membranes are moist.     Comments: Uvula midline but edematous, no exudate, bilateral tonsils 1+ without asymmetry Eyes:     Pupils: Pupils are equal, round, and reactive to light.  Cardiovascular:     Rate and Rhythm: Normal rate and regular rhythm.     Heart sounds: Normal heart sounds.  Pulmonary:     Effort: Pulmonary effort is normal. No respiratory distress.     Breath  sounds: No wheezing.  Abdominal:     General: Bowel sounds are normal.     Palpations: Abdomen is soft.  Musculoskeletal:     Cervical back: Neck supple.  Skin:    General: Skin is warm and dry.  Neurological:     Mental Status: She is alert and oriented to person, place, and time.  Psychiatric:        Mood and Affect: Mood normal.     ED Results / Procedures / Treatments   Labs (all labs ordered are listed, but only abnormal results are displayed) Labs Reviewed  RESP PANEL BY RT-PCR (RSV, FLU A&B, COVID)  [id number]  GROUP A STREP BY PCR    EKG None  Radiology No results found.  Procedures Procedures    Medications Ordered in ED Medications  acetaminophen (TYLENOL) tablet 650  mg (650 mg Oral Given 03/13/22 2042)  ketorolac (TORADOL) 30 MG/ML injection 30 mg (30 mg Intramuscular Given 03/14/22 0202)  dexamethasone (DECADRON) injection 10 mg (10 mg Intramuscular Given 03/14/22 0202)    ED Course/ Medical Decision Making/ A&P                           Medical Decision Making Risk OTC drugs. Prescription drug management.   This patient presents to the ED for concern of respiratory symptoms, this involves an extensive number of treatment options, and is a complaint that carries with it a high risk of complications and morbidity.  I considered the following differential and admission for this acute, potentially life threatening condition.  The differential diagnosis includes COVID, influenza, strep pharyngitis, other viral illness, less likely otitis media or bacterial pneumonia  MDM:    This is a 51 year old female who presents primarily with sore throat but has had upper respiratory symptoms.  Temperature 100.4.  Initially tachycardic and hypertensive.  Has a history of hypertension but is out of her blood pressure medication.  COVID, influenza, strep testing are all negative.  Suspect viral etiology.  She does have a history of diabetes; however, given uvular swelling, feel she would benefit from 1 dose of Decadron for swelling and inflammation.  Otherwise she was given a dose of Toradol.  Prior kidney function testing is reassuring.  We discussed ongoing supportive measures at home.  Patient is agreeable to plan.  She is requesting refills of her blood pressure medication.  These were provided with a 68-monthrefill.  (Labs, imaging, consults)  Labs: I Ordered, and personally interpreted labs.  The pertinent results include: COVID, influenza, strep testing  Imaging Studies ordered: I ordered imaging studies including none I independently visualized and interpreted imaging. I agree with the radiologist interpretation  Additional history obtained from chart  review.  External records from outside source obtained and reviewed including cardiology notes  Cardiac Monitoring: The patient was maintained on a cardiac monitor.  I personally viewed and interpreted the cardiac monitored which showed an underlying rhythm of: Sinus rhythm  Reevaluation: After the interventions noted above, I reevaluated the patient and found that they have :stayed the same  Social Determinants of Health:  lives independently  Disposition: Discharge  Co morbidities that complicate the patient evaluation  Past Medical History:  Diagnosis Date   Allergy    rhinitis   Anemia    Anxiety    Atrial fibrillation ([place]    Diabetes mellitus without complication ([place]    glucose usually 140-150   Enlarged heart    Hyperlipidemia  Hypertension    Morbid obesity ([place])    Shortness of breath dyspnea    with low iron     Medicines Meds ordered this encounter  Medications   acetaminophen (TYLENOL) tablet 650 mg   ketorolac (TORADOL) 30 MG/ML injection 30 mg   dexamethasone (DECADRON) injection 10 mg   diltiazem (CARDIZEM CD) 180 MG 24 hr capsule    Sig: Take 1 capsule (180 mg total) by mouth daily.    Dispense:  30 capsule    Refill:  2    I have reviewed the patients home medicines and have made adjustments as needed  Problem List / ED Course: Problem List Items Addressed This Visit   None Visit Diagnoses     Pharyngitis, unspecified etiology    -  Primary   Acute viral syndrome       Hypertension, unspecified type       Relevant Medications   diltiazem (CARDIZEM CD) 180 MG 24 hr capsule                   Final Clinical Impression(s) / ED Diagnoses Final diagnoses:  Pharyngitis, unspecified etiology  Acute viral syndrome  Hypertension, unspecified type    Rx / DC Orders ED Discharge Orders          Ordered    diltiazem (CARDIZEM CD) 180 MG 24 hr capsule  Daily        03/14/22 0216              Merryl Hacker,  MD 03/14/22 [phone]

## 2022-03-14 NOTE — Discharge Instructions (Signed)
You were seen today for upper respiratory symptoms including sore throat.  Your COVID, flu and strep testing is negative.  Given your history, you likely do have a virus.  Continue supportive measures at home.  Take Tylenol or ibuprofen as needed for pain.  Make sure that you are staying hydrated.

## 2022-03-16 ENCOUNTER — Emergency Department (HOSPITAL_BASED_OUTPATIENT_CLINIC_OR_DEPARTMENT_OTHER)
Admission: EM | Admit: 2022-03-16 | Discharge: 2022-03-16 | Disposition: A | Discharge status: Home / Self Care | Payer: BC Managed Care – PPO | Attending: Emergency Medicine | Admitting: Emergency Medicine

## 2022-03-16 ENCOUNTER — Other Ambulatory Visit: Payer: Self-pay

## 2022-03-16 ENCOUNTER — Encounter (HOSPITAL_BASED_OUTPATIENT_CLINIC_OR_DEPARTMENT_OTHER): Payer: Self-pay

## 2022-03-16 DIAGNOSIS — J029 Acute pharyngitis, unspecified: Secondary | ICD-10-CM

## 2022-03-16 DIAGNOSIS — I10 Essential (primary) hypertension: Secondary | ICD-10-CM | POA: Diagnosis not present

## 2022-03-16 DIAGNOSIS — Z7901 Long term (current) use of anticoagulants: Secondary | ICD-10-CM | POA: Insufficient documentation

## 2022-03-16 DIAGNOSIS — Z79899 Other long term (current) drug therapy: Secondary | ICD-10-CM | POA: Diagnosis not present

## 2022-03-16 DIAGNOSIS — E119 Type 2 diabetes mellitus without complications: Secondary | ICD-10-CM | POA: Insufficient documentation

## 2022-03-16 MED ORDER — DILTIAZEM HCL ER COATED BEADS 120 MG PO CP24
120.0000 mg | ORAL_CAPSULE | Freq: Once | ORAL | Status: AC
  Administered 2022-03-16: 120 mg via ORAL
  Filled 2022-03-16: qty 1

## 2022-03-16 MED ORDER — DILTIAZEM HCL ER COATED BEADS 180 MG PO CP24: 180.0000 mg | ORAL_CAPSULE | Freq: Every day | ORAL | Status: DC

## 2022-03-16 MED ORDER — LIDOCAINE VISCOUS HCL 2 % MT SOLN: 15.0000 mL | OROMUCOSAL | 1 refills | Status: DC | PRN

## 2022-03-16 MED ORDER — LIDOCAINE VISCOUS HCL 2 % MT SOLN
15.0000 mL | Freq: Once | OROMUCOSAL | Status: AC
  Administered 2022-03-16: 15 mL via OROMUCOSAL
  Filled 2022-03-16: qty 15

## 2022-03-16 MED ORDER — KETOROLAC TROMETHAMINE 15 MG/ML IJ SOLN
15.0000 mg | Freq: Once | INTRAMUSCULAR | Status: AC
  Administered 2022-03-16: 15 mg via INTRAMUSCULAR
  Filled 2022-03-16: qty 1

## 2022-03-16 NOTE — ED Triage Notes (Signed)
Patient states when here last Friday given prescription for BP meds which was unable to get filled due to pharmacy being closed

## 2022-03-16 NOTE — ED Triage Notes (Signed)
Onset Wednesday of sore throat  Seen here on Friday  back due to continued pain  NAD

## 2022-03-16 NOTE — ED Notes (Signed)
Pt initially refused to be discharge... Pt was explained again about the recommendations from the provider... Her V/S were rechecked before leaving... BP 191/100 HR 78 SpO2 98%... Pt was CAOx4.Lindsay KitchenMarland Gomez

## 2022-03-16 NOTE — Discharge Instructions (Signed)
Please use Tylenol or ibuprofen for pain.  You may use 600 mg ibuprofen every 6 hours or 1000 mg of Tylenol every 6 hours.  You may choose to alternate between the 2.  This would be most effective.  Not to exceed 4 g of Tylenol within 24 hours.  Not to exceed 3200 mg ibuprofen 24 hours.\  You can try over-the-counter Robitussin, tea with honey, other cough and cold medications, as you are on 5 days of having sore throat I do have suspicion that is likely to improve in the next several days, but you may still have some lingering sore throat for up to a week or 2.  If you have significant worsening symptoms, become unable to swallow, high fever that does not respond to Tylenol, ibuprofen please return to the emergency department for further evaluation.

## 2022-03-16 NOTE — ED Provider Notes (Signed)
[name] Provider Note   CSN: [id number] Arrival date & time: 03/16/22  0240     History  Chief Complaint  Patient presents with   Sore Throat    Lindsay Gomez is a 52 y.o. female  with Past medical history significant for morbid obesity, hypertension, depression, diabetes, A-fib currently taking Eliquis who presents with concern for ongoing sore throat.  Patient was seen here on Friday, reports that she is back secondary to continued pain.  She is given a shot of Toradol, Decadron at that time, reports that she did have some relief but is having ongoing pain.  She denies any fever, chills, nausea, vomiting.  She denies any chest pain, shortness of breath.  She does report some decreased appetite secondary to throat pain.   Sore Throat       Home Medications Prior to Admission medications   Medication Sig Start Date End Date Taking? Authorizing Provider  lidocaine (XYLOCAINE) 2 % solution Use as directed 15 mLs in the mouth or throat as needed for mouth pain. 03/16/22  Yes Lacole Komorowski H, PA-C  apixaban (ELIQUIS) 5 MG TABS tablet Take 1 tablet (5 mg total) by mouth 2 (two) times daily. 05/04/21 08/02/21  [place] Poag, DO  bismuth subsalicylate (PEPTO BISMOL) 262 MG chewable tablet Chew 524 mg by mouth as needed for indigestion or diarrhea or loose stools.    [provider]  Continuous Blood Gluc Sensor (FREESTYLE LIBRE 2 SENSOR) MISC 1 Device by Does not apply route every 14 (fourteen) days. 12/17/21   Shamleffer, Melanie Crazier, MD  diltiazem (CARDIZEM CD) 180 MG 24 hr capsule Take 1 capsule (180 mg total) by mouth daily. 03/14/22 06/12/22  [place], Barbette Hair, MD  irbesartan (AVAPRO) 300 MG tablet Take 1 tablet (300 mg total) by mouth daily. 05/04/21 08/02/21  [place] Eric J, DO  OZEMPIC, 1 MG/DOSE, 4 MG/3ML SOPN Inject 1 mg into the skin once a week. 12/17/21   Shamleffer, Melanie Crazier, MD  potassium chloride SA (KLOR-CON M) 20  MEQ tablet Take 1 tablet (20 mEq total) by mouth daily. 05/04/21 08/02/21  [place] Poag, DO  rosuvastatin (CRESTOR) 20 MG tablet Take 1 tablet (20 mg total) by mouth at bedtime. 05/04/21 08/02/21  [place] Eric J, DO  spironolactone (ALDACTONE) 25 MG tablet Take 1 tablet (25 mg total) by mouth daily. 05/04/21 08/02/21  [place] Eric J, DO      Allergies    Dulaglutide, Lisinopril, and Metformin hcl    Review of Systems   Review of Systems  All other systems reviewed and are negative.   Physical Exam Updated Vital Signs BP (!) 203/118 (BP Location: Right Arm)   Pulse 75   Temp 98.1 F (36.7 C) (Oral)   Resp 18   Ht '5\' 2"'$  (1.575 m)   Wt 127 kg   LMP 12/18/2014 (Exact Date)   SpO2 97%   BMI 51.21 kg/m  Physical Exam Vitals and nursing note reviewed.  Constitutional:      General: She is not in acute distress.    Appearance: Normal appearance.  HENT:     Head: Normocephalic and atraumatic.     Comments: Mild significant posterior oropharynx erythema, no significant swelling, exudate. Uvula midline, tonsils 1+ bilaterally.  No trismus, stridor, evidence of PTA, floor of mouth swelling or redness.   Eyes:     General:        Right eye: No discharge.  Left eye: No discharge.  Cardiovascular:     Rate and Rhythm: Normal rate and regular rhythm.     Heart sounds: No murmur heard.    No friction rub. No gallop.  Pulmonary:     Effort: Pulmonary effort is normal.     Breath sounds: Normal breath sounds.  Abdominal:     General: Bowel sounds are normal.     Palpations: Abdomen is soft.  Skin:    General: Skin is warm and dry.     Capillary Refill: Capillary refill takes less than 2 seconds.  Neurological:     Mental Status: She is alert and oriented to person, place, and time.  Psychiatric:        Mood and Affect: Mood normal.        Behavior: Behavior normal.     ED Results / Procedures / Treatments   Labs (all labs ordered are listed, but only abnormal results  are displayed) Labs Reviewed - No data to display  EKG None  Radiology No results found.  Procedures Procedures    Medications Ordered in ED Medications  lidocaine (XYLOCAINE) 2 % viscous mouth solution 15 mL (15 mLs Mouth/Throat Given 03/16/22 1049)  ketorolac (TORADOL) 15 MG/ML injection 15 mg (15 mg Intramuscular Given 03/16/22 1051)  diltiazem (CARDIZEM CD) 24 hr capsule 120 mg (120 mg Oral Given 03/16/22 1056)    ED Course/ Medical Decision Making/ A&P                           Medical Decision Making Risk Prescription drug management.   This patient is a 52 y.o. female who presents to the ED for concern of ongoing sore throat, as well as wanting her BP meds filled as her pharmacy is closed.   Differential diagnoses prior to evaluation: Viral pharyngitis, strep pharyngitis, tonsillitis, epiglottitis, hypertensive urgency, emergency, ACS, stroke, vs other  Past Medical History / Social History / Additional history: Chart reviewed. Pertinent results include: HTN, obesity, DM2, afib, taking eliquis  Physical Exam: Physical exam performed. The pertinent findings include: patient with mild erythema posterior oropharynx, tonsils 1-2+ bilaterally, no exudate.  Patient declining any COVID, flu, strep PCR testing.  She is requesting blood pressure medication and pain control, overall after discussion we are both under the impression that she has some ongoing viral pharyngitis, however patient is having difficulty controlling her pain.  Medications / Treatment: IM Toradol, give patient 1 dose of her long-acting blood pressure medication, she does have poorly controlled blood pressure in the emergency department but she is not having vision changes, numbness, tingling, chest pain, shortness of breath, and she is not taking her blood pressure medication because she has not picked it up yet, administered Cardizem, and encouraged her to pick up her medications   Disposition: After  consideration of the diagnostic results and the patients response to treatment, I feel that patient with signs symptoms of a viral pharyngitis, she is reporting significant pain but is not having any episodes of stridor, difficulty breathing, and is tolerating swallowing, do not think that she needs any additional treatment other than will provide prescription for viscous lidocaine, encouraged ibuprofen, Tylenol, lozenges, tea with honey or Robitussim.   emergency department workup does not suggest an emergent condition requiring admission or immediate intervention beyond what has been performed at this time. The plan is: as above. The patient is safe for discharge and has been instructed to return immediately for worsening  symptoms, change in symptoms or any other concerns.  Final Clinical Impression(s) / ED Diagnoses Final diagnoses:  Viral pharyngitis  Poorly-controlled hypertension    Rx / DC Orders ED Discharge Orders          Ordered    lidocaine (XYLOCAINE) 2 % solution  As needed        03/16/22 1051              Tena Linebaugh, [place] H, PA-C 03/16/22 1121    Tegeler, Gwenyth Allegra, MD 03/16/22 1529

## 2022-03-16 NOTE — ED Notes (Signed)
Discharge paperwork given and verbally understood. 

## 2022-03-18 ENCOUNTER — Emergency Department (HOSPITAL_COMMUNITY)
Admission: EM | Admit: 2022-03-18 | Discharge: 2022-03-19 | Discharge status: Against Medical Advice | Payer: BC Managed Care – PPO | Attending: Emergency Medicine | Admitting: Emergency Medicine

## 2022-03-18 DIAGNOSIS — Z5321 Procedure and treatment not carried out due to patient leaving prior to being seen by health care provider: Secondary | ICD-10-CM | POA: Diagnosis not present

## 2022-03-18 DIAGNOSIS — R519 Headache, unspecified: Secondary | ICD-10-CM | POA: Insufficient documentation

## 2022-03-18 NOTE — ED Triage Notes (Signed)
Patient BIB [facility] from home for evaluation of a headache that started three hours ago. Patient is inconsistent with taking antihypertensive medications. Carvedilol last taken 3 days ago and eliquis 2 days ago. BP 200/120 with EMS. Patient is alert, oriented, and in no apparent distress at this time.

## 2022-03-19 ENCOUNTER — Encounter: Payer: Self-pay | Admitting: Cardiology

## 2022-03-19 ENCOUNTER — Ambulatory Visit: Payer: BC Managed Care – PPO | Admitting: Cardiology

## 2022-03-19 VITALS — BP 150/91 | HR 91 | Resp 19 | Ht 62.0 in | Wt 261.2 lb

## 2022-03-19 DIAGNOSIS — Z7901 Long term (current) use of anticoagulants: Secondary | ICD-10-CM

## 2022-03-19 DIAGNOSIS — I10 Essential (primary) hypertension: Secondary | ICD-10-CM

## 2022-03-19 DIAGNOSIS — Z794 Long term (current) use of insulin: Secondary | ICD-10-CM

## 2022-03-19 DIAGNOSIS — E1165 Type 2 diabetes mellitus with hyperglycemia: Secondary | ICD-10-CM

## 2022-03-19 DIAGNOSIS — R0683 Snoring: Secondary | ICD-10-CM

## 2022-03-19 DIAGNOSIS — Z8249 Family history of ischemic heart disease and other diseases of the circulatory system: Secondary | ICD-10-CM

## 2022-03-19 DIAGNOSIS — I6381 Other cerebral infarction due to occlusion or stenosis of small artery: Secondary | ICD-10-CM

## 2022-03-19 DIAGNOSIS — E66813 Obesity, class 3: Secondary | ICD-10-CM

## 2022-03-19 DIAGNOSIS — R0609 Other forms of dyspnea: Secondary | ICD-10-CM

## 2022-03-19 DIAGNOSIS — E782 Mixed hyperlipidemia: Secondary | ICD-10-CM

## 2022-03-19 DIAGNOSIS — I480 Paroxysmal atrial fibrillation: Secondary | ICD-10-CM

## 2022-03-19 MED ORDER — ISOSORB DINITRATE-HYDRALAZINE 20-37.5 MG PO TABS: 1.0000 | ORAL_TABLET | Freq: Three times a day (TID) | ORAL | 2 refills | Status: AC

## 2022-03-19 MED ORDER — IRBESARTAN 150 MG PO TABS: 150.0000 mg | ORAL_TABLET | Freq: Every day | ORAL | 0 refills | Status: DC

## 2022-03-19 MED ORDER — AZILSARTAN-CHLORTHALIDONE 40-25 MG PO TABS: 1.0000 | ORAL_TABLET | Freq: Every morning | ORAL | 0 refills | Status: DC

## 2022-03-19 MED ORDER — ROSUVASTATIN CALCIUM 20 MG PO TABS: 20.0000 mg | ORAL_TABLET | Freq: Every day | ORAL | 0 refills | Status: DC

## 2022-03-19 NOTE — Progress Notes (Signed)
ID:  Lindsay Gomez, DOB [birth year], MRN [id number]  PCP:  Merryl Hacker, No  Cardiologist:  Rex Kras, DO, [facility] (established care 09/26/2019) Former Cardiology Providers: Larey Dresser, MD (2013)  Date: 03/19/22 Last Office Visit: 12/16/2020  Chief Complaint  Patient presents with   Hypertension   Follow-up    1 year    HPI  Lindsay Gomez is a 52 y.o. female whose past medical history and cardiovascular risk factors include: Paroxysmal atrial fibrillation, benign essential hypertension, insulin-dependent diabetes mellitus type 2, hyperlipidemia, history of stroke, obesity due to excess calorie, medical noncompliance.   Last seen during hospitalization in February 2023 when she was diagnosed with new onset of atrial fibrillation.  Since then she has not followed up in the office and has been noncompliant with medical therapy.  She decided to come today for follow-up she been experiencing elevated blood pressure readings.  Yesterday night her systolic blood pressures were greater than 200 mmHg with associated headaches.  Patient states that she went to ED but the wait time was greater than 20 hours and therefore decided to leave AMA and come to the office visit today.  She has been out of her antihypertensive medications completely with the exception of diltiazem which she uses for rate control for paroxysmal A-fib.  Patient denies anginal discomfort.  She has shortness of breath predominantly with effort related activities.  Patient stated she has been under a lot of stress recently as her boyfriend recently passed away.  Her sugars are also not well-controlled yesterday on going to the ED her serum glucose was 399.  FUNCTIONAL STATUS: No structured exercise program or daily routine.   ALLERGIES: Allergies  Allergen Reactions   Dulaglutide Other (See Comments)   Lisinopril Other (See Comments)    Cough    Metformin Hcl Other (See Comments)    MEDICATION LIST PRIOR TO  VISIT: Current Meds  Medication Sig   apixaban (ELIQUIS) 5 MG TABS tablet Take 1 tablet (5 mg total) by mouth 2 (two) times daily.   bismuth subsalicylate (PEPTO BISMOL) 262 MG chewable tablet Chew 524 mg by mouth as needed for indigestion or diarrhea or loose stools.   Continuous Blood Gluc Sensor (FREESTYLE LIBRE 2 SENSOR) MISC 1 Device by Does not apply route every 14 (fourteen) days.   diltiazem (CARDIZEM CD) 180 MG 24 hr capsule Take 1 capsule (180 mg total) by mouth daily.   irbesartan (AVAPRO) 150 MG tablet Take 1 tablet (150 mg total) by mouth daily at 10 pm.   isosorbide-hydrALAZINE (BIDIL) 20-37.5 MG tablet Take 1 tablet by mouth 3 (three) times daily.   OZEMPIC, 1 MG/DOSE, 4 MG/3ML SOPN Inject 1 mg into the skin once a week.   [DISCONTINUED] Azilsartan-Chlorthalidone 40-25 MG TABS Take 1 tablet by mouth in the morning.   [DISCONTINUED] rosuvastatin (CRESTOR) 20 MG tablet Take 1 tablet (20 mg total) by mouth at bedtime.   [DISCONTINUED] spironolactone (ALDACTONE) 25 MG tablet Take 1 tablet (25 mg total) by mouth daily.     PAST MEDICAL HISTORY: Past Medical History:  Diagnosis Date   Allergy    rhinitis   Anemia    Anxiety    Atrial fibrillation ([place])    Diabetes mellitus without complication (HCC)    glucose usually 140-150   Enlarged heart    Hyperlipidemia    Hypertension    Morbid obesity ([place])    Shortness of breath dyspnea    with low iron    PAST SURGICAL  HISTORY: Past Surgical History:  Procedure Laterality Date   ABDOMINAL HYSTERECTOMY N/A 01/29/2015   Procedure: TOTAL ABDOMINAL HYSTERECTOMY ;  Surgeon: Ena Dawley, MD;  Location: [facility];  Service: Gynecology;  Laterality: N/A;   BILATERAL SALPINGECTOMY Bilateral 01/29/2015   Procedure: BILATERAL SALPINGECTOMY;  Surgeon: Ena Dawley, MD;  Location: [facility];  Service: Gynecology;  Laterality: Bilateral;   [place]   ? arthroscopic/ right    FAMILY HISTORY: The  patient family history includes Cancer in her paternal grandmother; Coronary artery disease (age of onset: 44) in her father; Diabetes in her father and mother; Heart attack in her father; Heart failure in her father; Hyperlipidemia in an other family member; Hypertension in her mother and another family member; Stroke (age of onset: 35) in her brother.  SOCIAL HISTORY:  The patient  reports that she has never smoked. She has never used smokeless tobacco. She reports that she does not drink alcohol and does not use drugs.  REVIEW OF SYSTEMS: Review of Systems  Cardiovascular:  Positive for dyspnea on exertion. Negative for chest pain, claudication, irregular heartbeat, leg swelling, near-syncope, orthopnea, palpitations, paroxysmal nocturnal dyspnea and syncope.  Respiratory:  Positive for shortness of breath.   Hematologic/Lymphatic: Negative for bleeding problem.  Musculoskeletal:  Negative for muscle cramps and myalgias.  Neurological:  Negative for dizziness and light-headedness.    PHYSICAL EXAM:    03/19/2022   12:37 PM 03/16/2022   10:57 AM 03/16/2022   10:56 AM  Vitals with BMI  Height _0     Weight 261 lbs 3 oz    BMI 75.91    Systolic 638 466 599  Diastolic 91 357 017  Pulse 91 75    Physical Exam  Constitutional: No distress.  Age appropriate, hemodynamically stable.   Neck: No JVD present.  Cardiovascular: Normal rate, regular rhythm, S1 normal, S2 normal, intact distal pulses and normal pulses. Exam reveals no gallop, no S3 and no S4.  No murmur heard. Pulmonary/Chest: Effort normal and breath sounds normal. No stridor. She has no wheezes. She has no rales.  Abdominal: Soft. Bowel sounds are normal. She exhibits no distension. There is no abdominal tenderness.  Musculoskeletal:        General: No edema.     Cervical back: Neck supple.  Neurological: She is alert and oriented to person, place, and time. She has intact cranial nerves (2-12).  Skin: Skin is warm and  moist.    Radiology:  MRI HEAD WITHOUT CONTRAST 02/2017: Subcentimeter acute infarction in the left lateral thalamus.  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  CARDIAC DATABASE: EKG: 03/19/2022: Sinus rhythm, 100 bpm, LAE, LVH per voltage criteria, nonspecific ST-T changes likely secondary to LVH.  Echocardiogram: 05/04/2021:  1. Left ventricular ejection fraction, by estimation, is 55 to 60%. The left ventricle has normal function. The left ventricle has no regional wall motion abnormalities. There is moderate left ventricular hypertrophy. Left ventricular diastolic parameters are consistent with Grade II diastolic dysfunction (pseudonormalization). Elevated left atrial pressure.   2. Right ventricular systolic function is normal. The right ventricular size is normal.   3. Left atrial size was mildly dilated.   4. The mitral valve is normal in structure. Mild mitral valve regurgitation. No evidence of mitral stenosis.   5. The aortic valve is normal in structure. Aortic valve regurgitation is not visualized. No aortic stenosis is  present.Compared to prior study 08/12/2020: Mildly dilated LA, Grade 2 diastolic dysfunction and elevated LAP are new findings otherwise, no significant change.  Stress Testing: None  Heart Catheterization: None  Carotid duplex: 02/2017:  Right Carotid: There is evidence in the right ICA of a 1-39% stenosis.  Left Carotid: There is evidence in the left ICA of a 1-39% stenosis.  Vertebrals: Both vertebral arteries were patent with antegrade flow.   [place] LE Venous Duplex 09/08/2019:   RIGHT: No evidence of common femoral vein obstruction.  LEFT:  There is no evidence of deep vein thrombosis in the lower extremity. No cystic structure found in the popliteal fossa.   CT Cardiac Scoring: 03/03/2021 Total CAC 85.8 AU, 98th percentile for patient's age, sex, and race.     LABORATORY DATA:    Latest Ref Rng & Units 05/04/2021    3:56 AM 05/03/2021    2:00 AM 08/11/2020   12:12 PM  CBC  WBC 4.0 - 10.5 K/uL 6.1  6.1  5.9   Hemoglobin 12.0 - 15.0 g/dL 14.3  14.6  15.0   Hematocrit 36.0 - 46.0 % 43.0  46.5  46.4   Platelets 150 - 400 K/uL 232  279  262        Latest Ref Rng & Units 05/04/2021    3:56 AM 05/03/2021    2:00 AM 08/13/2020    5:28 AM  CMP  Glucose 70 - 99 mg/dL 171  237  233   BUN 6 - 20 mg/dL _0 Creatinine 0.44 - 1.00 mg/dL 0.66  0.74  0.74   Sodium 135 - 145 mmol/L 139  139  136   Potassium 3.5 - 5.1 mmol/L 3.4  3.6  3.4   Chloride 98 - 111 mmol/L 105  104  100   CO2 22 - 32 mmol/L _1 Calcium 8.9 - 10.3 mg/dL 8.4  8.9  8.9     Lipid Panel     Component Value Date/Time   CHOL 173 05/03/2021 1824   TRIG 95 05/03/2021 1824   HDL 41 05/03/2021 1824   CHOLHDL 4.2 05/03/2021 1824   VLDL 19 05/03/2021 1824   LDLCALC 113 (H) 05/03/2021 1824   LDLDIRECT 120.2 (H) 05/04/2021 0356    BMP Recent Labs    05/03/21 0200 05/04/21 0356  NA 139 139  K 3.6 3.4*  CL 104 105  CO2 24 24  GLUCOSE 237* 171*  BUN 10 7  CREATININE 0.74 0.66  CALCIUM 8.9 8.4*  GFRNONAA >60 >60    HEMOGLOBIN A1C Lab Results  Component Value Date   HGBA1C 9.4 (A) 12/17/2021   MPG 162.81 05/03/2021    IMPRESSION:    ICD-10-CM   1. Paroxysmal atrial fibrillation (HCC)  I48.0 Hemoglobin and hematocrit, blood    2. Long term (current) use of anticoagulants  Z79.01     3. Dyspnea on exertion  R06.09 EKG 12-Lead    4. Essential hypertension  I10 EKG 12-Lead    isosorbide-hydrALAZINE (BIDIL) 20-37.5 MG tablet    CMP14+EGFR    irbesartan (AVAPRO) 150 MG tablet    DISCONTINUED: Azilsartan-Chlorthalidone 40-25 MG TABS    5. Snoring  R06.83 Ambulatory referral to Sleep Studies    6. Type 2 diabetes mellitus with hyperglycemia, unspecified whether long term insulin use (HCC)  E11.65 rosuvastatin (CRESTOR) 20 MG tablet    7.  Long-term insulin use (HCC)  Z79.4     8.  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 Family history of premature CAD  Z82.49     35. Class 3 severe obesity due to excess calories with serious comorbidity and body mass index (BMI) of 45.0 to 49.9 in adult (HCC)  E66.01    Z68.42     11. Mixed hyperlipidemia  E78.2 Lipid Panel With LDL/HDL Ratio    LDL cholesterol, direct    rosuvastatin (CRESTOR) 20 MG tablet       RECOMMENDATIONS: MATTINGLY FOUNTAINE is a 52 y.o. female whose past medical history and cardiac risk factors include: Paroxysmal atrial fibrillation, benign essential hypertension, insulin-dependent diabetes mellitus type 2, hyperlipidemia, history of stroke, obesity due to excess calorie, medical noncompliance.   Paroxysmal atrial fibrillation [facility]) Newly discovered February 2023 Converted to normal sinus rhythm with parenteral medications Rate control: Cardizem 180 mg p.o. daily Rhythm control: N/A. Thromboembolic prophylaxis: Eliquis CHA2DS2-VASc SCORE is 5 which correlates to 6.7% risk of stroke per year (HTN, DM, stroke, gender). Reemphasized the importance of being treated for sleep apnea. Encouraged the importance of healthy weight loss and consuming a heart healthy diet and increasing physical activity as tolerated with a goal of 30 minutes a day 5 days a week.  Dyspnea on exertion Likely secondary to uncontrolled hypertension. Once her blood pressures are better controlled would recommend an ischemic workup given her multiple cardiovascular risk factors. Euvolemic on physical examination. Educated on seeking medical attention sooner by going to the closest ER via EMS if the symptoms increase in intensity, frequency, duration, or has typical chest pain as discussed in the office.  Patient verbalized understanding.  Essential hypertension Office blood pressures are not well-controlled. Secondary to her running out of all her antihypertensive medications. She fails to  follow-up on outpatient basis. Patient requested a prescription for Azilsartan/chlorthalidone; however, had to d/c after talking to her pharmacist during the office visit because its cost prohibitive. Will send a prescription for Avapro 150 mg p.o. every afternoon. Start BiDil 20/37.5 mg p.o. twice daily Patient is asked to keep a log of her blood pressures and bring it in at the next office visit. Will refer her to sleep medicine for sleep apnea evaluation  Snoring Referred to ambulatory sleep medicine for sleep study.  Type 2 diabetes mellitus with hyperglycemia Educated her on importance of glycemic control. She is unsure of what her last hemoglobin A1c is. Has been having elevated readings. Have asked her to follow-up with either PCP or endocrinologist. Will refill ARB as well as statin therapy. Currently on Ozempic.  Mixed hyperlipidemia Will check fasting lipid profile. Refilled rosuvastatin.  FINAL MEDICATION LIST END OF ENCOUNTER: Meds ordered this encounter  Medications   DISCONTD: Azilsartan-Chlorthalidone 40-25 MG TABS    Sig: Take 1 tablet by mouth in the morning.    Dispense:  90 tablet    Refill:  0   isosorbide-hydrALAZINE (BIDIL) 20-37.5 MG tablet    Sig: Take 1 tablet by mouth 3 (three) times daily.    Dispense:  90 tablet    Refill:  2   rosuvastatin (CRESTOR) 20 MG tablet    Sig: Take 1 tablet (20 mg total) by mouth at bedtime.    Dispense:  90 tablet    Refill:  0   irbesartan (AVAPRO) 150 MG tablet    Sig: Take 1 tablet (150 mg total) by mouth daily at 10 pm.    Dispense:  30 tablet    Refill:  0  Current Outpatient Medications:    apixaban (ELIQUIS) 5 MG TABS tablet, Take 1 tablet (5 mg total) by mouth 2 (two) times daily., Disp: 60 tablet, Rfl: 2   bismuth subsalicylate (PEPTO BISMOL) 262 MG chewable tablet, Chew 524 mg by mouth as needed for indigestion or diarrhea or loose stools., Disp: , Rfl:    Continuous Blood Gluc Sensor (FREESTYLE LIBRE  2 SENSOR) MISC, 1 Device by Does not apply route every 14 (fourteen) days., Disp: 6 each, Rfl: 3   diltiazem (CARDIZEM CD) 180 MG 24 hr capsule, Take 1 capsule (180 mg total) by mouth daily., Disp: 30 capsule, Rfl: 2   irbesartan (AVAPRO) 150 MG tablet, Take 1 tablet (150 mg total) by mouth daily at 10 pm., Disp: 30 tablet, Rfl: 0   isosorbide-hydrALAZINE (BIDIL) 20-37.5 MG tablet, Take 1 tablet by mouth 3 (three) times daily., Disp: 90 tablet, Rfl: 2   OZEMPIC, 1 MG/DOSE, 4 MG/3ML SOPN, Inject 1 mg into the skin once a week., Disp: 9 mL, Rfl: 3   rosuvastatin (CRESTOR) 20 MG tablet, Take 1 tablet (20 mg total) by mouth at bedtime., Disp: 90 tablet, Rfl: 0  Orders Placed This Encounter  Procedures   Hemoglobin and hematocrit, blood   CMP14+EGFR   Lipid Panel With LDL/HDL Ratio   LDL cholesterol, direct   Ambulatory referral to Sleep Studies   EKG 12-Lead   --Continue cardiac medications as reconciled in final medication list. --Return in about 4 weeks (around 04/16/2022) for Follow up, BP. Or sooner if needed. --Continue follow-up with your primary care physician regarding the management of your other chronic comorbid conditions.  Patient's questions and concerns were addressed to her satisfaction. She voices understanding of the instructions provided during this encounter.   This note was created using a voice recognition software as a result there may be grammatical errors inadvertently enclosed that do not reflect the nature of this encounter. Every attempt is made to correct such errors.  Rex Kras, [place], [facility] [address]  Pager: [phone] Office: [phone]

## 2022-03-19 NOTE — ED Notes (Signed)
Called to complete triage as pt was placed back in [facility]. No answer

## 2022-03-21 ENCOUNTER — Emergency Department (HOSPITAL_BASED_OUTPATIENT_CLINIC_OR_DEPARTMENT_OTHER)
Admission: EM | Admit: 2022-03-21 | Discharge: 2022-03-21 | Disposition: A | Discharge status: Home / Self Care | Payer: BC Managed Care – PPO | Attending: Emergency Medicine | Admitting: Emergency Medicine

## 2022-03-21 ENCOUNTER — Other Ambulatory Visit: Payer: Self-pay

## 2022-03-21 ENCOUNTER — Emergency Department (HOSPITAL_BASED_OUTPATIENT_CLINIC_OR_DEPARTMENT_OTHER): Payer: BC Managed Care – PPO

## 2022-03-21 DIAGNOSIS — E119 Type 2 diabetes mellitus without complications: Secondary | ICD-10-CM | POA: Diagnosis not present

## 2022-03-21 DIAGNOSIS — Z79899 Other long term (current) drug therapy: Secondary | ICD-10-CM | POA: Insufficient documentation

## 2022-03-21 DIAGNOSIS — Z7901 Long term (current) use of anticoagulants: Secondary | ICD-10-CM | POA: Insufficient documentation

## 2022-03-21 DIAGNOSIS — E876 Hypokalemia: Secondary | ICD-10-CM | POA: Diagnosis not present

## 2022-03-21 DIAGNOSIS — F4321 Adjustment disorder with depressed mood: Secondary | ICD-10-CM | POA: Diagnosis not present

## 2022-03-21 DIAGNOSIS — I10 Essential (primary) hypertension: Secondary | ICD-10-CM | POA: Diagnosis not present

## 2022-03-21 DIAGNOSIS — J029 Acute pharyngitis, unspecified: Secondary | ICD-10-CM | POA: Diagnosis present

## 2022-03-21 LAB — BASIC METABOLIC PANEL
Anion gap: 10 (ref 5–15)
BUN: 11 mg/dL (ref 6–20)
CO2: 34 mmol/L — ABNORMAL HIGH (ref 22–32)
Calcium: 9.5 mg/dL (ref 8.9–10.3)
Chloride: 94 mmol/L — ABNORMAL LOW (ref 98–111)
Creatinine, Ser: 0.95 mg/dL (ref 0.44–1.00)
GFR, Estimated: >60 mL/min (ref >60)
Glucose, Bld: 370 mg/dL — ABNORMAL HIGH (ref 70–99)
Potassium: 3.2 mmol/L — ABNORMAL LOW (ref 3.5–5.1)
Sodium: 138 mmol/L (ref 135–145)

## 2022-03-21 LAB — CBC WITH DIFFERENTIAL/PLATELET
Abs Immature Granulocytes: 0.04 10*3/uL (ref 0.00–0.07)
Basophils Absolute: 0 10*3/uL (ref 0.0–0.1)
Basophils Relative: 1 %
Eosinophils Absolute: 0 10*3/uL (ref 0.0–0.5)
Eosinophils Relative: 1 %
HCT: 47.9 % — ABNORMAL HIGH (ref 36.0–46.0)
Hemoglobin: 15.6 g/dL — ABNORMAL HIGH (ref 12.0–15.0)
Immature Granulocytes: 1 %
Lymphocytes Relative: 22 %
Lymphs Abs: 1.7 10*3/uL (ref 0.7–4.0)
MCH: 27.5 pg (ref 26.0–34.0)
MCHC: 32.6 g/dL (ref 30.0–36.0)
MCV: 84.5 fL (ref 80.0–100.0)
Monocytes Absolute: 0.5 10*3/uL (ref 0.1–1.0)
Monocytes Relative: 6 %
Neutro Abs: 5.2 10*3/uL (ref 1.7–7.7)
Neutrophils Relative %: 69 %
Platelets: 294 10*3/uL (ref 150–400)
RBC: 5.67 MIL/uL — ABNORMAL HIGH (ref 3.87–5.11)
RDW: 14.1 % (ref 11.5–15.5)
WBC: 7.5 10*3/uL (ref 4.0–10.5)
nRBC: 0 % (ref 0.0–0.2)

## 2022-03-21 LAB — MONONUCLEOSIS SCREEN: Mono Screen: NEGATIVE

## 2022-03-21 MED ORDER — POTASSIUM CHLORIDE CRYS ER 20 MEQ PO TBCR
40.0000 meq | EXTENDED_RELEASE_TABLET | Freq: Once | ORAL | Status: AC
  Administered 2022-03-21: 40 meq via ORAL
  Filled 2022-03-21: qty 2

## 2022-03-21 MED ORDER — IOHEXOL 300 MG/ML  SOLN
100.0000 mL | Freq: Once | INTRAMUSCULAR | Status: AC | PRN
  Administered 2022-03-21: 100 mL via INTRAVENOUS

## 2022-03-21 MED ORDER — ONDANSETRON HCL 4 MG/2ML IJ SOLN
4.0000 mg | Freq: Once | INTRAMUSCULAR | Status: AC
  Administered 2022-03-21: 4 mg via INTRAVENOUS
  Filled 2022-03-21: qty 2

## 2022-03-21 MED ORDER — BENZONATATE 100 MG PO CAPS: 100.0000 mg | ORAL_CAPSULE | Freq: Three times a day (TID) | ORAL | 0 refills | Status: DC | PRN

## 2022-03-21 MED ORDER — KETOROLAC TROMETHAMINE 15 MG/ML IJ SOLN
15.0000 mg | Freq: Once | INTRAMUSCULAR | Status: AC
  Administered 2022-03-21: 15 mg via INTRAVENOUS
  Filled 2022-03-21: qty 1

## 2022-03-21 MED ORDER — ONDANSETRON HCL 4 MG PO TABS: 4.0000 mg | ORAL_TABLET | Freq: Three times a day (TID) | ORAL | 0 refills | Status: AC | PRN

## 2022-03-21 MED ORDER — ONDANSETRON HCL 4 MG PO TABS: 4.0000 mg | ORAL_TABLET | Freq: Three times a day (TID) | ORAL | 0 refills | Status: DC | PRN

## 2022-03-21 NOTE — ED Triage Notes (Signed)
Pt returns via pov from home with sore throat x 1 week. Pt has been seen on 2 other occasions for the same, and states that she has had no relief with OTC meds and steroids given to her. Pt states it hurts to swallow and that she is unable to sleep due to the pain.

## 2022-03-21 NOTE — ED Notes (Signed)
To CT via w/c, "feel better".

## 2022-03-21 NOTE — ED Provider Notes (Addendum)
[facility] EMERGENCY DEPT Provider Note   CSN: [id number] Arrival date & time: 03/21/22  1556     History   Lindsay Gomez is a 52 y.o. female with PMH a-fib on anticoagulation, anemia, DM, HTN, and HLD who presents to ED c/o sore throat x 1 week. Pt has been evaluated for these symptoms in urgent care and at the emergency department with diagnosis of viral phayngitis but has had no relief with OTC medications, throat lozenges, chloraseptic spray, Decadron shot x 2, or viscous lidocaine. She had shot of Toradol at previous visit with temporary relief of pain but states it came right back several hours later and has been persistent since that time. Reports no relief with viscous lidocaine. She states she is able to tolerate secretions and PO intake though does have increased pain with swallowing and a slight cough that worsens her pain and makes it hard to sleep at night. She denies fever, chills, vomiting, chest pain, or shortness of breath. States blood sugars have been uncontrolled at home with recordings in the 200s and 300s. Triage reported that pt expressed vague thoughts of wanting to go to sleep and not wake up. Further explored this complaint with pt who reported that her long-time partner unfortunately passed away at the end of last year and she has had difficulty with grief since that time. She reported history of depression and anxiety. She denied prior suicide attempts or psychiatric hospitalizations. She denied any actual thoughts of wanting to harm herself or others or auditory or visual hallucinations. She states that she simply misses her partner because he was her major support system especially when she was ill like today. She reports having family members and friends who can be a support for her. She is not currently seeing a mental health professional.      Home Medications Prior to Admission medications   Medication Sig Start Date End Date Taking? Authorizing  Provider  apixaban (ELIQUIS) 5 MG TABS tablet Take 1 tablet (5 mg total) by mouth 2 (two) times daily. 05/04/21 03/19/22  [place], Alvira Philips, DO  benzonatate (TESSALON) 100 MG capsule Take 1 capsule (100 mg total) by mouth 3 (three) times daily as needed for cough. 03/21/22   Analya Louissaint L, PA-C  bismuth subsalicylate (PEPTO BISMOL) 262 MG chewable tablet Chew 524 mg by mouth as needed for indigestion or diarrhea or loose stools.    [provider]  Continuous Blood Gluc Sensor (FREESTYLE LIBRE 2 SENSOR) MISC 1 Device by Does not apply route every 14 (fourteen) days. 12/17/21   Shamleffer, Konrad Dolores, MD  diltiazem (CARDIZEM CD) 180 MG 24 hr capsule Take 1 capsule (180 mg total) by mouth daily. 03/14/22 06/12/22  Horton, Mayer Masker, MD  irbesartan (AVAPRO) 150 MG tablet Take 1 tablet (150 mg total) by mouth daily at 10 pm. 03/19/22 04/18/22  Tolia, Sunit, DO  isosorbide-hydrALAZINE (BIDIL) 20-37.5 MG tablet Take 1 tablet by mouth 3 (three) times daily. 03/19/22 06/17/22  Tolia, Sunit, DO  ondansetron (ZOFRAN) 4 MG tablet Take 1 tablet (4 mg total) by mouth every 8 (eight) hours as needed for up to 3 days for nausea or vomiting. 03/21/22 03/24/22  Leocadia Idleman L, PA-C  OZEMPIC, 1 MG/DOSE, 4 MG/3ML SOPN Inject 1 mg into the skin once a week. 12/17/21   Shamleffer, Konrad Dolores, MD  rosuvastatin (CRESTOR) 20 MG tablet Take 1 tablet (20 mg total) by mouth at bedtime. 03/19/22 06/17/22  Tessa Lerner, DO  Allergies    Dulaglutide, Lisinopril, and Metformin hcl    Review of Systems   Review of Systems  Constitutional:  Negative for activity change, appetite change, chills, fatigue and fever.  HENT:  Positive for sore throat. Negative for congestion, drooling, ear pain, facial swelling, rhinorrhea, sinus pressure, sinus pain and voice change.   Eyes:  Negative for pain and visual disturbance.  Respiratory:  Positive for cough. Negative for apnea, choking, chest tightness, shortness of breath, wheezing  and stridor.   Cardiovascular:  Negative for chest pain and palpitations.  Gastrointestinal:  Positive for nausea. Negative for abdominal pain, diarrhea and vomiting.  Genitourinary:  Negative for dysuria and hematuria.  Musculoskeletal:  Negative for arthralgias, back pain, neck pain and neck stiffness.  Skin:  Negative for color change and rash.  Neurological:  Negative for dizziness, seizures, syncope, facial asymmetry, speech difficulty, weakness, light-headedness, numbness and headaches.  Hematological:  Negative for adenopathy.  All other systems reviewed and are negative.   Physical Exam Updated Vital Signs BP 132/68 (BP Location: Right Arm)   Pulse 84   Temp 97.9 F (36.6 C) (Oral)   Resp 18   Ht 5\' 2"  (1.575 m)   Wt 122.9 kg   LMP 12/18/2014 (Exact Date)   SpO2 99%   BMI 49.57 kg/m  Physical Exam Vitals and nursing note reviewed.  Constitutional:      General: She is not in acute distress.    Appearance: Normal appearance. She is not ill-appearing, toxic-appearing or diaphoretic.  HENT:     Head: Normocephalic and atraumatic.     Nose: Nose normal.     Mouth/Throat:     Mouth: Mucous membranes are moist.     Pharynx: Posterior oropharyngeal erythema (moderate posterior pharynx) present. No oropharyngeal exudate.     Comments: Moderate erythema to bilateral tonsils, no pharyngeal or tonsillar edema or exudates, patent airway, no significant abnormalities noted to dentition, no tongue swelling or oral lesions, no tenderness, swelling, or redness to floor of mouth, no evidence of peritonsillar abscess, normal phonation Eyes:     General: No scleral icterus.       Right eye: No discharge.        Left eye: No discharge.     Conjunctiva/sclera: Conjunctivae normal.  Cardiovascular:     Rate and Rhythm: Normal rate and regular rhythm.     Heart sounds: No murmur heard. Pulmonary:     Effort: Pulmonary effort is normal. No respiratory distress.     Breath sounds: Normal  breath sounds. No stridor. No wheezing, rhonchi or rales.  Chest:     Chest wall: No tenderness.  Abdominal:     General: Abdomen is flat.     Palpations: Abdomen is soft.     Tenderness: There is no abdominal tenderness. There is no guarding or rebound.  Musculoskeletal:        General: Normal range of motion.     Cervical back: Normal range of motion and neck supple.     Right lower leg: No edema.     Left lower leg: No edema.  Lymphadenopathy:     Cervical: No cervical adenopathy.  Skin:    General: Skin is warm and dry.     Capillary Refill: Capillary refill takes less than 2 seconds.  Neurological:     Mental Status: She is alert. Mental status is at baseline.  Psychiatric:        Attention and Perception: Attention and perception normal. She does  not perceive auditory or visual hallucinations.        Speech: Speech normal.        Behavior: Behavior is cooperative.        Thought Content: Thought content is not paranoid or delusional. Thought content does not include homicidal or suicidal ideation. Thought content does not include homicidal or suicidal plan.        Cognition and Memory: Memory normal.        Judgment: Judgment normal.     Comments: Tearful when discussing her deceased partner, seems to be appropriate grief     ED Results / Procedures / Treatments   Labs (all labs ordered are listed, but only abnormal results are displayed) Labs Reviewed  CBC WITH DIFFERENTIAL/PLATELET - Abnormal; Notable for the following components:      Result Value   RBC 5.67 (*)    Hemoglobin 15.6 (*)    HCT 47.9 (*)    All other components within normal limits  BASIC METABOLIC PANEL - Abnormal; Notable for the following components:   Potassium 3.2 (*)    Chloride 94 (*)    CO2 34 (*)    Glucose, Bld 370 (*)    All other components within normal limits  MONONUCLEOSIS SCREEN    EKG None  Radiology CT Soft Tissue Neck W Contrast  Result Date: 03/21/2022 CLINICAL DATA:   Epiglottitis or tonsillitis suspected EXAM: CT NECK WITH CONTRAST TECHNIQUE: Multidetector CT imaging of the neck was performed using the standard protocol following the bolus administration of intravenous contrast. RADIATION DOSE REDUCTION: This exam was performed according to the departmental dose-optimization program which includes automated exposure control, adjustment of the mA and/or kV according to patient size and/or use of iterative reconstruction technique. CONTRAST:  OMNIPAQUE IOHEXOL 300 MG/ML  SOLN COMPARISON:  None Available. FINDINGS: Pharynx and larynx: Normal. No mass or swelling. No significant edema or hyperenhancement of the tonsils. Salivary glands: No inflammation, mass, or stone. Thyroid: Normal. Lymph nodes: None enlarged or abnormal density. Vascular: Patent. 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 Common origin of the common carotid arteries. Limited intracranial: Negative. Visualized orbits: No acute finding in the orbits. Mastoids and visualized paranasal sinuses: Mucous retention cyst in the left maxillary sinus. Otherwise clear paranasal sinuses. No fluid in the pneumatized portions of the mastoids. Skeleton: No acute or aggressive process. Upper chest: No focal pulmonary opacity or pleural effusion. Other: None. IMPRESSION: 1. No acute abnormality in the neck. No significant edema or hyperenhancement of the tonsils. 2. 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 Electronically Signed   By: Wiliam Ke M.D.   On: 03/21/2022 19:12    Procedures None  Medications Ordered in ED Medications  ketorolac (TORADOL) 15 MG/ML injection 15 mg (15 mg Intravenous Given 03/21/22 1747)  ondansetron (ZOFRAN) injection 4 mg (4 mg Intravenous Given  03/21/22 1747)  iohexol (OMNIPAQUE) 300 MG/ML solution 100 mL (100 mLs Intravenous Contrast Given 03/21/22 1840)  ketorolac (TORADOL) 15 MG/ML injection 15 mg (15 mg Intravenous Given 03/21/22 1953)  potassium chloride SA (KLOR-CON M) CR tablet 40 mEq (40 mEq Oral Given 03/21/22 1951)    ED Course/ Medical Decision Making/ A&P  Medical Decision Making Amount and/or Complexity of Data Reviewed Labs: ordered. Decision-making details documented in ED Course. Radiology: ordered. Decision-making details documented in ED Course.  Risk Prescription drug management.   This is a 52 year old female presenting to ED with complaint of persistent sore throat. Presentation most consistent with viral pharyngitis and pt has had previous evaluations for symptoms but without significant improvement. She has had negative viral swabs for COVID, flu, and RSV and a negative strep swab. Pt is very anxious regarding symptoms and though she does have some erythema to posterior pharynx, airway is patent, no swelling or edema noted to orophaynx, no signs of dental or peritonsillar abscess, she is afebrile with stable vital signs, no identifiable lymphadenopathy on exam, and lungs are clear to auscultation with no respiratory distress, stridor, or abnormal phonation. With complaint of severe, persistent symptoms and through shared decision making, we proceeded with blood work, mono screen, and CT neck to rule out peritonsillar or retropharyngeal abscess, mononucleosis, airway compromise, or other concerns. I treated pt's pain with Toradol in addition to Zofran for any subsequent nausea. Pt had good pain relief with this. Fortunately, CT scan of the neck had no signs of peritonsillar abscess, retropharyngeal abscess, or other explanations for her sore throat. Her mono screen was also negative. Lab work without leukocytosis. Mild to moderate hypokalemia at 3.2 which was repleted. With out of control blood  glucose levels, opted to forego more doses of steroids. Pt aware of reasoning behind this and importance of better glucose control. Pt updated on findings. She did have good relief of pain after first dose of Toradol though it is slightly returning on re-exam so discussed option to redose which she would like to do prior to discharge. With symptoms preventing pt from getting good rest, will send home with Tessalon and Zofran for pt to add to regimen in addition to recommendation of using Motrin instead of Tylenol for pain relief as she responded well to Toradol. Pt happy with this plan and expressed gratitude for care she received in ED today. Also discussed recommendation for outpatient mental health follow up. Understandable she is dealing with grief following loss of her long-term partner but in setting of history of anxiety and depression I believe she will benefit from psychotherapy and/or prescription medication to help her cope. Provided with extensive resource guide for outpatient mental health services. She also agreed to follow up with her primary care regarding today's visit and concerns. Given strict return precautions for chest pain, shortness of breath, fever, uncontrolled vomiting, throat swelling, suicidal thoughts, homicidal thoughts, or other concerns. Pt understood and agreed with plan. Stable for discharge.          Final Clinical Impression(s) / ED Diagnoses Final diagnoses:  Acute viral pharyngitis  Hypokalemia  Grief reaction    Rx / DC Orders ED Discharge Orders          Ordered    benzonatate (TESSALON) 100 MG capsule  3 times daily PRN,   Status:  Discontinued        03/21/22 1936    ondansetron (ZOFRAN) 4 MG tablet  Every 8 hours PRN,   Status:  Discontinued        03/21/22 1936    benzonatate (TESSALON) 100 MG capsule  3 times daily PRN        03/21/22 2001    ondansetron (ZOFRAN) 4 MG tablet  Every 8 hours PRN        03/21/22 2001  Tonette Lederer, PA-C 03/22/22 2256    Tonette Lederer, PA-C 03/23/22 1210    Arby Barrette, MD 03/25/22 1544

## 2022-03-21 NOTE — ED Notes (Signed)
Not in room

## 2022-03-21 NOTE — Discharge Instructions (Addendum)
Thank you for letting Lindsay Gomez take care of you today.  Fortunately, we did not find a cause for your symptoms on CT scan today. There were no signs of infection or other significant abnormalities on this scan. Your test for mono was also negative. You had a mild hypokalemia (or low potassium level) on your blood work but otherwise it did not show signs of infection such as an elevated white blood count. You were given a dose of potassium prior to discharge.  It is very important to follow up with a primary care provider. Please try and make an appointment in the next 2 weeks or sooner if your symptoms are not improving. You should also discuss with them better management of your blood sugars and the low potassium level you had today. I have provided the names of 2 clinics you can contact to schedule an appointment should you not be able to get an earlier appointment with your PCP.  Continue to use the symptomatic treatments you have been using for your sore throat such as viscous lidocaine, chloraseptic spray, lozenges, hot tea, etc. I also recommend using ibuprofen '600mg'$  3 times a day for better pain control as you had relief with the Toradol given in the ED. Do not use this medication for more than a few days in a row as it can cause stomach upset and/or problems with your kidneys. I have also provided a medication called Tessalon to help with cough and Zofran should you experience any nausea.  It is understandable you are dealing with grief following the recent loss of your partner. I recommend outpatient counseling to help cope with this. I have provided a resource guide with outpatient providers who you can contact to set up an appointment. Please also discuss your grief and any other issues with your mental health at your follow up PCP appointment.   Should you develop difficulty swallowing, chest pain, shortness of breath, fever, thoughts of wanting to harm yourself or others, or other concerns, please  return to the nearest emergency department for re-evaluation.

## 2022-03-21 NOTE — ED Notes (Signed)
EDPA into room, at BS.  

## 2022-04-06 ENCOUNTER — Other Ambulatory Visit: Payer: Self-pay | Admitting: Cardiology

## 2022-04-07 LAB — CMP14+EGFR
ALT: 20 IU/L (ref 0–32)
AST: 20 IU/L (ref 0–40)
Albumin/Globulin Ratio: 1.5 (ref 1.2–2.2)
Albumin: 4.1 g/dL (ref 3.9–4.9)
Alkaline Phosphatase: 124 IU/L — ABNORMAL HIGH (ref 44–121)
BUN/Creatinine Ratio: 11 (ref 9–23)
BUN: 9 mg/dL (ref 6–24)
Bilirubin Total: 0.4 mg/dL (ref 0.0–1.2)
CO2: 26 mmol/L (ref 20–29)
Calcium: 9.4 mg/dL (ref 8.7–10.2)
Chloride: 100 mmol/L (ref 96–106)
Creatinine, Ser: 0.8 mg/dL (ref 0.57–1.00)
Globulin, Total: 2.8 g/dL (ref 1.5–4.5)
Glucose: 218 mg/dL — ABNORMAL HIGH (ref 70–99)
Potassium: 3.9 mmol/L (ref 3.5–5.2)
Sodium: 139 mmol/L (ref 134–144)
Total Protein: 6.9 g/dL (ref 6.0–8.5)
eGFR: 90 mL/min/{1.73_m2} (ref >59)

## 2022-04-07 LAB — HEMOGLOBIN AND HEMATOCRIT, BLOOD
Hematocrit: 46 % (ref 34.0–46.6)
Hemoglobin: 14.9 g/dL (ref 11.1–15.9)

## 2022-04-07 LAB — LIPID PANEL WITH LDL/HDL RATIO
Cholesterol, Total: 128 mg/dL (ref 100–199)
HDL: 53 mg/dL (ref >39)
LDL Chol Calc (NIH): 61 mg/dL (ref 0–99)
LDL/HDL Ratio: 1.2 ratio (ref 0.0–3.2)
Triglycerides: 68 mg/dL (ref 0–149)
VLDL Cholesterol Cal: 14 mg/dL (ref 5–40)

## 2022-04-07 LAB — LDL CHOLESTEROL, DIRECT: LDL Direct: 71 mg/dL (ref 0–99)

## 2022-04-08 ENCOUNTER — Telehealth: Payer: Self-pay | Admitting: Cardiology

## 2022-04-08 NOTE — Telephone Encounter (Signed)
Patient was scheduled for follow-up visit on 04/16/2022. She says she has seen results on MyChart and doesn't feel coming into the office is necessary at this time. She is asking if someone can call her to review results instead.

## 2022-04-09 NOTE — Telephone Encounter (Signed)
Please refer to the result note and convey my comments to her.  Follow-up visit was more for blood pressure management as earlier this month in January 2024 her SBP was around 200 mmHg + headache.  Please find out what her blood pressures been running at home.  I think it is still in her best interest to follow-up in February as originally scheduled more for blood pressure management and not only for reviewing labs.  Friedrich Harriott [place], DO, [facility]

## 2022-04-10 NOTE — Progress Notes (Signed)
Called patient no answer, could not leave vm due to vm being full

## 2022-04-13 ENCOUNTER — Ambulatory Visit: Payer: BC Managed Care – PPO | Admitting: Cardiology

## 2022-04-14 NOTE — Progress Notes (Signed)
Call patient, VM full could not leave message.

## 2022-04-15 NOTE — Telephone Encounter (Signed)
Patient needs appointment with ST

## 2022-04-16 ENCOUNTER — Ambulatory Visit: Payer: BC Managed Care – PPO | Admitting: Cardiology

## 2022-04-24 NOTE — Progress Notes (Signed)
Results given, she will call and discuss with endocrinologist. She acknowledged information given and had no further questions.

## 2022-05-26 ENCOUNTER — Telehealth: Payer: Self-pay

## 2022-05-26 NOTE — Telephone Encounter (Signed)
Received referral from Dr Luciana Axe at [facility] for OSA - needs repeat PSG. Placed in sleep mailbox

## 2022-06-23 ENCOUNTER — Other Ambulatory Visit: Payer: Self-pay | Admitting: Cardiology

## 2022-06-23 NOTE — Telephone Encounter (Signed)
Should I refill?

## 2022-07-01 ENCOUNTER — Ambulatory Visit (INDEPENDENT_AMBULATORY_CARE_PROVIDER_SITE_OTHER): Payer: BC Managed Care – PPO | Admitting: Internal Medicine

## 2022-07-01 ENCOUNTER — Encounter: Payer: Self-pay | Admitting: Internal Medicine

## 2022-07-01 ENCOUNTER — Telehealth: Payer: Self-pay

## 2022-07-01 ENCOUNTER — Other Ambulatory Visit (HOSPITAL_COMMUNITY): Payer: Self-pay

## 2022-07-01 VITALS — BP 136/84 | HR 74 | Ht 62.0 in | Wt 273.0 lb

## 2022-07-01 DIAGNOSIS — M722 Plantar fascial fibromatosis: Secondary | ICD-10-CM | POA: Diagnosis not present

## 2022-07-01 DIAGNOSIS — E1142 Type 2 diabetes mellitus with diabetic polyneuropathy: Secondary | ICD-10-CM

## 2022-07-01 DIAGNOSIS — E1165 Type 2 diabetes mellitus with hyperglycemia: Secondary | ICD-10-CM | POA: Diagnosis not present

## 2022-07-01 DIAGNOSIS — E1159 Type 2 diabetes mellitus with other circulatory complications: Secondary | ICD-10-CM | POA: Diagnosis not present

## 2022-07-01 LAB — POCT GLUCOSE (DEVICE FOR HOME USE): POC Glucose: 155 mg/dl — AB (ref 70–99)

## 2022-07-01 LAB — POCT GLYCOSYLATED HEMOGLOBIN (HGB A1C): Hemoglobin A1C: 9.2 % — AB (ref 4.0–5.6)

## 2022-07-01 MED ORDER — GLIMEPIRIDE 1 MG PO TABS: 1.0000 mg | ORAL_TABLET | Freq: Every day | ORAL | 2 refills | Status: DC

## 2022-07-01 MED ORDER — FREESTYLE LIBRE 3 SENSOR MISC: 1.0000 | 3 refills | Status: DC

## 2022-07-01 MED ORDER — OZEMPIC (1 MG/DOSE) 4 MG/3ML ~~LOC~~ SOPN: 1.0000 mg | PEN_INJECTOR | SUBCUTANEOUS | 3 refills | Status: DC

## 2022-07-01 NOTE — Telephone Encounter (Signed)
Preferred CGM is Dexcom, please change if appropriate.

## 2022-07-01 NOTE — Patient Instructions (Signed)
-   Start Glimepiride 1 mg, 1 tablet before the first meal of the day  - Continue Ozempic 1 mg weekly     HOW TO TREAT LOW BLOOD SUGARS (Blood sugar LESS THAN 70 MG/DL) Please follow the RULE OF 15 for the treatment of hypoglycemia treatment (when your (blood sugars are less than 70 mg/dL)   STEP 1: Take 15 grams of carbohydrates when your blood sugar is low, which includes:  3-4 GLUCOSE TABS  OR 3-4 OZ OF JUICE OR REGULAR SODA OR ONE TUBE OF GLUCOSE GEL    STEP 2: RECHECK blood sugar in 15 MINUTES STEP 3: If your blood sugar is still low at the 15 minute recheck --> then, go back to STEP 1 and treat AGAIN with another 15 grams of carbohydrates.

## 2022-07-01 NOTE — Telephone Encounter (Signed)
PA in office for visit and states that [place] 2 sensors need PA.

## 2022-07-01 NOTE — Progress Notes (Signed)
Name: Lindsay Gomez  Age/ Sex: 52 y.o., female   MRN/ DOB: [id number], 23-Aug-1970     PCP: Verlon Au, MD   Reason for Endocrinology Evaluation: Type 2 Diabetes Mellitus  Initial Endocrine Consultative Visit: 01/07/18    PATIENT IDENTIFIER: Lindsay Gomez is a 51 y.o. female with a past medical history of HTN, Obesity,CVA, OSA not on treatment and T2DM . The patient has followed with Endocrinology clinic since 01/07/18 for consultative assistance with management of her diabetes.  DIABETIC HISTORY:  Lindsay Gomez was diagnosed with T2DM in 2016,She is intolerant to Metformin.On her initial visit to our clinic she was on Basaglar, glipizide and Trulicity, she did admit to noncompliance with her meds, she would take basaglar once a month. Her hemoglobin A1c has ranged from  6.3% in 2014, peaking at 14.1% in 2018     SUBJECTIVE:   During the last visit (06/12/2021): A1c 9.7 %     Today (07/01/2022): Lindsay Gomez is here for a follow up on diabetes management. She has not had used the sensor in 3 weeks.   She continues with dietary indiscretions  She has not been taking Ozempic on regular basis either   Her feet are painful  at the heels but no neuropathy symptoms  She lost her boyfriend 01/2022, started therapy yesterday     HOME DIABETES REGIMEN:  Ozempic 1 mg weekly ( Sunday)     GLUCOSE METER : n/a   DIABETIC COMPLICATIONS: Microvascular complications:  Neuropathy Denies:  Retinopathy, nephropathy Last eye exam: Completed 05/2022     Macrovascular complications:  CVA (02/2017) Denies: CAD, PVD       HISTORY:  Past Medical History:  Past Medical History:  Diagnosis Date   Allergy    rhinitis   Anemia    Anxiety    Atrial fibrillation    Diabetes mellitus without complication    glucose usually 140-150   Enlarged heart    Hyperlipidemia    Hypertension    Morbid obesity    Shortness of breath dyspnea    with low iron   Past  Surgical History:  Past Surgical History:  Procedure Laterality Date   ABDOMINAL HYSTERECTOMY N/A 01/29/2015   Procedure: TOTAL ABDOMINAL HYSTERECTOMY ;  Surgeon: Kirkland Hun, MD;  Location: [facility];  Service: Gynecology;  Laterality: N/A;   BILATERAL SALPINGECTOMY Bilateral 01/29/2015   Procedure: BILATERAL SALPINGECTOMY;  Surgeon: Kirkland Hun, MD;  Location: [facility];  Service: Gynecology;  Laterality: Bilateral;   CESAREAN SECTION     KNEE SURGERY  1992   ? arthroscopic/ right   Social History:  reports that she has never smoked. She has never used smokeless tobacco. She reports that she does not drink alcohol and does not use drugs. Family History:  Family History  Problem Relation Age of Onset   Hypertension Mother    Diabetes Mother    Coronary artery disease Father 85   Heart failure Father    Heart attack Father    Diabetes Father    Stroke Brother 40   Cancer Paternal Grandmother        unknown type?   Hypertension Other    Hyperlipidemia Other    Esophageal cancer Neg Hx    Colon cancer Neg Hx    Pancreatic cancer Neg Hx    Stomach cancer Neg Hx    Atrial fibrillation Neg Hx      HOME MEDICATIONS: Allergies as of 07/01/2022  Reactions   Dulaglutide Other (See Comments)   Lisinopril Other (See Comments)   Cough    Metformin Hcl Other (See Comments)        Medication List        Accurate as of July 01, 2022  3:52 PM. If you have any questions, ask your nurse or doctor.          STOP taking these medications    benzonatate 100 MG capsule Commonly known as: TESSALON Stopped by: Scarlette Shorts, MD       TAKE these medications    bismuth subsalicylate 262 MG chewable tablet Commonly known as: PEPTO BISMOL Chew 524 mg by mouth as needed for indigestion or diarrhea or loose stools.   diltiazem 180 MG 24 hr capsule Commonly known as: CARDIZEM CD Take 1 capsule (180 mg total) by mouth daily.   Eliquis 5 MG Tabs tablet Generic  drug: apixaban Take 1 tablet (5 mg total) by mouth 2 (two) times daily.   FreeStyle Libre 3 Sensor Misc 1 Device by Does not apply route every 14 (fourteen) days. Place 1 sensor on the skin every 14 days. Use to check glucose continuously What changed: additional instructions Changed by: Scarlette Shorts, MD   glimepiride 1 MG tablet Commonly known as: AMARYL Take 1 tablet (1 mg total) by mouth daily with breakfast. Started by: Scarlette Shorts, MD   irbesartan 150 MG tablet Commonly known as: AVAPRO Take 1 tablet (150 mg total) by mouth daily at 10 pm.   Ozempic (1 MG/DOSE) 4 MG/3ML Sopn Generic drug: Semaglutide (1 MG/DOSE) Inject 1 mg into the skin once a week.   rosuvastatin 20 MG tablet Commonly known as: CRESTOR Take 1 tablet (20 mg total) by mouth at bedtime.         OBJECTIVE:   Vital Signs: BP 136/84 (BP Location: Left Arm, Patient Position: Sitting, Cuff Size: Large)   Pulse 74   Ht  (1.575 m)   Wt 273 lb (123.8 kg)   LMP 12/18/2014 (Exact Date)   SpO2 99%   BMI 49.93 kg/m   Wt Readings from Last 3 Encounters:  07/01/22 273 lb (123.8 kg)  03/21/22 271 lb (122.9 kg)  03/19/22 261 lb 3.2 oz (118.5 kg)     Exam: General: Pt appears well and is in NAD  Lungs: Clear with good BS bilat   Heart: RRR   Abdomen: soft, nontender, without masses or organomegaly palpable  Extremities: No  pretibial edema.    Neuro: MS is good with appropriate affect, pt is alert and Ox3   DM foot exam 07/01/2022 The skin of the feet is intact without sores or ulcerations. The pedal pulses are 2+ on right and 2+ on left. The sensation is intact to a screening 5.07, 10 gram monofilament bilaterally      DATA REVIEWED: 05/20/2022 GFR > 90 CR 0.7 BUN 11   ASSESSMENT / PLAN / RECOMMENDATIONS:   1) Type 2 Diabetes Mellitus, Poorly Controlled , With neuropathic  and macrovascular complications - Most recent A1c of 9.2 %. Goal A1c < 7.0 %.    -Patient  continues with hyperglycemia -She has been noted with dietary indiscretion as well as imperfect adherence to medication, she lost her boyfriend in November 2023 and has been eating emotionally, she started therapy yesterday -Intolerant to higher doses of Ozempic due to diarrhea -Patient agreed to glimepiride, discussed mechanism of action as well as the risk of hypoglycemia and weight gain  -  A new prescription for freestyle libre 3 has been sent, and a sample sensor was provided today  MEDICATIONS: Start glimepiride 1 mg daily  Continue Ozempic 1 mg weekly for now   EDUCATION / INSTRUCTIONS: BG monitoring instructions: Patient is instructed to check her blood sugars 3 times a day, before meals  2) Diabetic complications:  Eye:She does not have known diabetic retinopathy. Pt urged to schedule an eye exam again Neuro/ Feet: Does have known diabetic peripheral neuropathy. Renal: Patient does not have known baseline CKD. She is on on an ACEI/ARB at present.   3) Plantar fasciitis:  -Patient already has changed shoes with better padding and has been wearing insoles -Patient to use Tylenol as needed -We discussed stretching exercises   Follow-up in 6 months  Signed electronically by: Lyndle Herrlich, MD  [facility] Endocrinology  [facility] [address] [place] [place], [place] [address] Phone: [phone] FAX: [phone]   CC: Verlon Au, MD [address] [place] [address] Phone: [phone]  Fax: [phone]  Return to Endocrinology clinic as below: Future Appointments  Date Time Provider Department Center  01/01/2023  2:40 PM Isadore Bokhari, Konrad Dolores, MD [facility] None

## 2022-07-30 ENCOUNTER — Encounter: Payer: Self-pay | Admitting: Nurse Practitioner

## 2022-07-30 ENCOUNTER — Encounter: Payer: Self-pay | Admitting: Gastroenterology

## 2022-08-20 ENCOUNTER — Telehealth: Payer: Self-pay | Admitting: Internal Medicine

## 2022-08-20 NOTE — Telephone Encounter (Signed)
Patient advises had the Free Style 3 sensors but her cel phone is not compatible as a reader. Patient requesting Free Style 3 Reader if we have one in the office to give her  Call back # [phone]

## 2022-08-24 ENCOUNTER — Other Ambulatory Visit: Payer: Self-pay

## 2022-08-24 MED ORDER — FREESTYLE LIBRE 3 READER DEVI: 1.0000 | 0 refills | Status: DC

## 2022-08-24 NOTE — Telephone Encounter (Signed)
LMTCB and also sent mychart message 

## 2022-08-24 NOTE — Telephone Encounter (Signed)
Patient is calling to say that she does want a prescription sent in for the [facility] Reader 3 to  [facility] [place], [place] - [address] [place] (Ph: [phone])   Also, the patient does state that she is having trouble with connection because she has a new cell phone.

## 2022-08-28 ENCOUNTER — Other Ambulatory Visit (HOSPITAL_COMMUNITY): Payer: Self-pay

## 2022-09-03 ENCOUNTER — Telehealth: Payer: Self-pay | Admitting: Gastroenterology

## 2022-09-03 NOTE — Telephone Encounter (Signed)
Inbound call from patient stating she is no longer taking Eliquis. States she stopped taking medication about a month ago. Please advise if she still needs office visit or can be scheduling for colonoscopy directly. Thank you.

## 2022-09-03 NOTE — Telephone Encounter (Signed)
The pt has been advised that she should keep appt as planned for evaluation. Her BMI is over 51 as of the last office note.  She agrees to keep the appt as planned.

## 2022-10-11 NOTE — Progress Notes (Unsigned)
10/11/2022 Lindsay Gomez [id number] 08/01/1970   CHIEF COMPLAINT: Discuss scheduling a colonoscopy   HISTORY OF PRESENT ILLNESS: Lindsay Gomez is a 52 year old female with a past medical history of anxiety, obesity, hypertension, hyperlipidemia, atrial fibrillation on Eliquis, CVA (left thalamic infarct) 2018, DM type II,   She presents to our office today as referred by Dr. Verlon Au to schedule a screening colonoscopy.   On Ozempic      Latest Ref Rng & Units 04/06/2022    9:06 AM 03/21/2022    5:32 PM 05/04/2021    3:56 AM  CBC  WBC 4.0 - 10.5 K/uL  7.5  6.1   Hemoglobin 11.1 - 15.9 g/dL 40.9  81.1  91.4   Hematocrit 34.0 - 46.6 % 46.0  47.9  43.0   Platelets 150 - 400 K/uL  294  232         Latest Ref Rng & Units 04/06/2022    9:06 AM 03/21/2022    5:32 PM 05/04/2021    3:56 AM  CMP  Glucose 70 - 99 mg/dL 782  956  213   BUN 6 - 24 mg/dL 9  11  7    Creatinine 0.57 - 1.00 mg/dL 0.86  5.78  4.69   Sodium 134 - 144 mmol/L 139  138  139   Potassium 3.5 - 5.2 mmol/L 3.9  3.2  3.4   Chloride 96 - 106 mmol/L 100  94  105   CO2 20 - 29 mmol/L 26  34  24   Calcium 8.7 - 10.2 mg/dL 9.4  9.5  8.4   Total Protein 6.0 - 8.5 g/dL 6.9     Total Bilirubin 0.0 - 1.2 mg/dL 0.4     Alkaline Phos 44 - 121 IU/L 124     AST 0 - 40 IU/L 20     ALT 0 - 32 IU/L 20       ECHO 05/04/2021: IMPRESSIONS Left ventricular ejection fraction, by estimation, is 55 to 60%. The left ventricle has normal function. The left ventricle has no regional wall motion abnormalities. There is moderate left ventricular hypertrophy. Left ventricular diastolic parameters are consistent with Grade II diastolic dysfunction (pseudonormalization). Elevated left atrial pressure. 1. 2. Right ventricular systolic function is normal. The right ventricular size is normal. 3. Left atrial size was mildly dilated. The mitral valve is normal in structure. Mild mitral valve regurgitation. No evidence of mitral  stenosis. 4. The aortic valve is normal in structure. Aortic valve regurgitation is not visualized. No aortic stenosis is present.    Past Medical History:  Diagnosis Date   Allergy    rhinitis   Anemia    Anxiety    Atrial fibrillation (HCC)    Diabetes mellitus without complication (HCC)    glucose usually 140-150   Enlarged heart    Hyperlipidemia    Hypertension    Morbid obesity (HCC)    Shortness of breath dyspnea    with low iron   Past Surgical History:  Procedure Laterality Date   ABDOMINAL HYSTERECTOMY N/A 01/29/2015   Procedure: TOTAL ABDOMINAL HYSTERECTOMY ;  Surgeon: Kirkland Hun, MD;  Location: [facility];  Service: Gynecology;  Laterality: N/A;   BILATERAL SALPINGECTOMY Bilateral 01/29/2015   Procedure: BILATERAL SALPINGECTOMY;  Surgeon: Kirkland Hun, MD;  Location: [facility];  Service: Gynecology;  Laterality: Bilateral;   CESAREAN SECTION     KNEE SURGERY  1992   ? arthroscopic/ right  Social History:  Family History:    reports that she has never smoked. She has never used smokeless tobacco. She reports that she does not drink alcohol and does not use drugs. family history includes Cancer in her paternal grandmother; Coronary artery disease (age of onset: 64) in her father; Diabetes in her father and mother; Heart attack in her father; Heart failure in her father; Hyperlipidemia in an other family member; Hypertension in her mother and another family member; Stroke (age of onset: 15) in her brother.  Allergies  Allergen Reactions   Dulaglutide Other (See Comments)   Lisinopril Other (See Comments)    Cough    Metformin Hcl Other (See Comments)      Outpatient Encounter Medications as of 10/12/2022  Medication Sig   bismuth subsalicylate (PEPTO BISMOL) 262 MG chewable tablet Chew 524 mg by mouth as needed for indigestion or diarrhea or loose stools.   Continuous Glucose Receiver (FREESTYLE LIBRE 3 READER) DEVI 1 Device by Does not apply route  continuous. USE TO CHECK BLOOD SUGAR   Continuous Glucose Sensor (FREESTYLE LIBRE 3 SENSOR) MISC 1 Device by Does not apply route every 14 (fourteen) days. Place 1 sensor on the skin every 14 days. Use to check glucose continuously   diltiazem (CARDIZEM CD) 180 MG 24 hr capsule Take 1 capsule (180 mg total) by mouth daily.   ELIQUIS 5 MG TABS tablet Take 1 tablet (5 mg total) by mouth 2 (two) times daily.   glimepiride (AMARYL) 1 MG tablet Take 1 tablet (1 mg total) by mouth daily with breakfast.   irbesartan (AVAPRO) 150 MG tablet Take 1 tablet (150 mg total) by mouth daily at 10 pm.   OZEMPIC, 1 MG/DOSE, 4 MG/3ML SOPN Inject 1 mg into the skin once a week.   rosuvastatin (CRESTOR) 20 MG tablet Take 1 tablet (20 mg total) by mouth at bedtime.   No facility-administered encounter medications on file as of 10/12/2022.     REVIEW OF SYSTEMS:  Gen: Denies fever, sweats or chills. No weight loss.  CV: Denies chest pain, palpitations or edema. Resp: Denies cough, shortness of breath of hemoptysis.  GI: Denies heartburn, dysphagia, stomach or lower abdominal pain. No diarrhea or constipation.  GU: Denies urinary burning, blood in urine, increased urinary frequency or incontinence. MS: Denies joint pain, muscles aches or weakness. Derm: Denies rash, itchiness, skin lesions or unhealing ulcers. Psych: Denies depression, anxiety, memory loss or confusion. Heme: Denies bruising, easy bleeding. Neuro:  Denies headaches, dizziness or paresthesias. Endo:  Denies any problems with DM, thyroid or adrenal function.  PHYSICAL EXAM: LMP 12/18/2014 (Exact Date)  General: in no acute distress. Head: Normocephalic and atraumatic. Eyes:  Sclerae non-icteric, conjunctive pink. Ears: Normal auditory acuity. Mouth: Dentition intact. No ulcers or lesions.  Neck: Supple, no lymphadenopathy or thyromegaly.  Lungs: Clear bilaterally to auscultation without wheezes, crackles or rhonchi. Heart: Regular rate and  rhythm. No murmur, rub or gallop appreciated.  Abdomen: Soft, nontender, nondistended. No masses. No hepatosplenomegaly. Normoactive bowel sounds x 4 quadrants.  Rectal: Deferred.  Musculoskeletal: Symmetrical with no gross deformities. Skin: Warm and dry. No rash or lesions on visible extremities. Extremities: No edema. Neurological: Alert oriented x 4, no focal deficits.  Psychological:  Alert and cooperative. Normal mood and affect.  ASSESSMENT AND PLAN:  52 year old female presents to schedule a screening colonoscopy  -Colonoscopy -Hold Ozempic for one week prior to colonoscopy date   Elevated alk phos level with normal T. Bili,  AST/ALT levels  -Hepatic panel, GGT  Atrial fibrillation on Eliquis    CC:  Verlon Au, MD

## 2022-10-12 ENCOUNTER — Encounter: Payer: Self-pay | Admitting: Nurse Practitioner

## 2022-10-12 ENCOUNTER — Ambulatory Visit (INDEPENDENT_AMBULATORY_CARE_PROVIDER_SITE_OTHER): Payer: BC Managed Care – PPO | Admitting: Nurse Practitioner

## 2022-10-12 VITALS — BP 160/100 | HR 108 | Ht 62.0 in | Wt 269.2 lb

## 2022-10-12 DIAGNOSIS — R197 Diarrhea, unspecified: Secondary | ICD-10-CM | POA: Diagnosis not present

## 2022-10-12 DIAGNOSIS — E1142 Type 2 diabetes mellitus with diabetic polyneuropathy: Secondary | ICD-10-CM

## 2022-10-12 DIAGNOSIS — K921 Melena: Secondary | ICD-10-CM | POA: Diagnosis not present

## 2022-10-12 DIAGNOSIS — E119 Type 2 diabetes mellitus without complications: Secondary | ICD-10-CM

## 2022-10-12 DIAGNOSIS — I639 Cerebral infarction, unspecified: Secondary | ICD-10-CM

## 2022-10-12 DIAGNOSIS — R1030 Lower abdominal pain, unspecified: Secondary | ICD-10-CM | POA: Diagnosis not present

## 2022-10-12 DIAGNOSIS — Z7985 Long-term (current) use of injectable non-insulin antidiabetic drugs: Secondary | ICD-10-CM

## 2022-10-12 DIAGNOSIS — I4891 Unspecified atrial fibrillation: Secondary | ICD-10-CM

## 2022-10-12 DIAGNOSIS — R159 Full incontinence of feces: Secondary | ICD-10-CM | POA: Diagnosis not present

## 2022-10-12 DIAGNOSIS — I10 Essential (primary) hypertension: Secondary | ICD-10-CM

## 2022-10-12 MED ORDER — OMEPRAZOLE 20 MG PO CPDR: 20.0000 mg | DELAYED_RELEASE_CAPSULE | Freq: Every day | ORAL | 1 refills | Status: DC

## 2022-10-12 NOTE — Patient Instructions (Addendum)
Our office will contact you to schedule an EGD & colonoscopy.  Contact our office with new cardiologist information.  Cardiac clearance to include Eliquis hold instructions.  Hold Mounjaro 7 day prior to procedure date.  Due to recent changes in healthcare laws, you may see the results of your imaging and laboratory studies on MyChart before your provider has had a chance to review them.  We understand that in some cases there may be results that are confusing or concerning to you. Not all laboratory results come back in the same time frame and the provider may be waiting for multiple results in order to interpret others.  Please give [place] 48 hours in order for your provider to thoroughly review all the results before contacting the office for clarification of your results.   Thank you for trusting me with your gastrointestinal care!   Alcide Evener, CRNP

## 2022-10-19 ENCOUNTER — Other Ambulatory Visit (INDEPENDENT_AMBULATORY_CARE_PROVIDER_SITE_OTHER): Payer: BC Managed Care – PPO

## 2022-10-19 ENCOUNTER — Telehealth: Payer: Self-pay | Admitting: Nurse Practitioner

## 2022-10-19 DIAGNOSIS — E1142 Type 2 diabetes mellitus with diabetic polyneuropathy: Secondary | ICD-10-CM | POA: Diagnosis not present

## 2022-10-19 DIAGNOSIS — K921 Melena: Secondary | ICD-10-CM | POA: Diagnosis not present

## 2022-10-19 DIAGNOSIS — R197 Diarrhea, unspecified: Secondary | ICD-10-CM | POA: Diagnosis not present

## 2022-10-19 DIAGNOSIS — I639 Cerebral infarction, unspecified: Secondary | ICD-10-CM

## 2022-10-19 LAB — CBC WITH DIFFERENTIAL/PLATELET
Basophils Absolute: 0.1 10*3/uL (ref 0.0–0.1)
Basophils Relative: 1.8 % (ref 0.0–3.0)
Eosinophils Absolute: 0.1 10*3/uL (ref 0.0–0.7)
Eosinophils Relative: 0.9 % (ref 0.0–5.0)
HCT: 46 % (ref 36.0–46.0)
Hemoglobin: 14.4 g/dL (ref 12.0–15.0)
Lymphocytes Relative: 33.9 % (ref 12.0–46.0)
Lymphs Abs: 2.1 10*3/uL (ref 0.7–4.0)
MCHC: 31.4 g/dL (ref 30.0–36.0)
MCV: 84.2 fl (ref 78.0–100.0)
Monocytes Absolute: 0.4 10*3/uL (ref 0.1–1.0)
Monocytes Relative: 6.5 % (ref 3.0–12.0)
Neutro Abs: 3.6 10*3/uL (ref 1.4–7.7)
Neutrophils Relative %: 56.9 % (ref 43.0–77.0)
Platelets: 323 10*3/uL (ref 150.0–400.0)
RBC: 5.47 Mil/uL — ABNORMAL HIGH (ref 3.87–5.11)
RDW: 15.3 % (ref 11.5–15.5)
WBC: 6.3 10*3/uL (ref 4.0–10.5)

## 2022-10-19 LAB — COMPREHENSIVE METABOLIC PANEL
ALT: 25 U/L (ref 0–35)
AST: 28 U/L (ref 0–37)
Albumin: 4.2 g/dL (ref 3.5–5.2)
Alkaline Phosphatase: 98 U/L (ref 39–117)
BUN: 11 mg/dL (ref 6–23)
CO2: 31 mEq/L (ref 19–32)
Calcium: 9.3 mg/dL (ref 8.4–10.5)
Chloride: 95 mEq/L — ABNORMAL LOW (ref 96–112)
Creatinine, Ser: 0.81 mg/dL (ref 0.40–1.20)
GFR: 84.81 mL/min (ref >60.00)
Glucose, Bld: 373 mg/dL — ABNORMAL HIGH (ref 70–99)
Potassium: 3.6 mEq/L (ref 3.5–5.1)
Sodium: 136 mEq/L (ref 135–145)
Total Bilirubin: 0.4 mg/dL (ref 0.2–1.2)
Total Protein: 8 g/dL (ref 6.0–8.3)

## 2022-10-19 NOTE — Telephone Encounter (Signed)
Pt was notified of Alcide Evener NP recommendations for Labs to be completed and the name of pt new selected Cardiologist. Pt stated that she was going to com eby the lab this Afternoon. Pt was notified that we close at 5 pm. Pt stated that she would call us back with the name of her Cardiologist.  Pt verbalized understanding with all questions answered.

## 2022-10-19 NOTE — Telephone Encounter (Signed)
Lindsay Gomez, refer to office visit 10/12/2022.  Please contact the patient and remind her to have labs done in her lab including CBC and CMP as ordered at the time of her office visit but were not done.  Please obtain the name of the patient's newly selected cardiologist as we need to send an official cardiac clearance request to also include Eliquis hold instructions prior to scheduling an EGD and colonoscopy at [facility]. THX.

## 2022-10-21 NOTE — Telephone Encounter (Signed)
PT returning call. The name of her cardiologist is Dr. Wilkie Aye

## 2022-10-21 NOTE — Telephone Encounter (Signed)
Pt stated that her Cardiologist was Dr. Wilkie Aye. Pt stated that she has her first appointment to see him on 12/04/2022. Pt was notified that after she sees her cardiologist to please call our office as soon as possible so that we can get pt scheduled for her procedures and get clearance for her cardiologist. Pt verbalized understanding with all questions answered.   Personal Reminder created in Epic to reach out to the pt on 12/07/2022.

## 2022-11-10 ENCOUNTER — Ambulatory Visit (INDEPENDENT_AMBULATORY_CARE_PROVIDER_SITE_OTHER): Payer: BC Managed Care – PPO | Admitting: Neurology

## 2022-11-10 ENCOUNTER — Encounter: Payer: Self-pay | Admitting: Neurology

## 2022-11-10 VITALS — BP 176/121 | HR 91 | Ht 62.0 in | Wt 272.0 lb

## 2022-11-10 DIAGNOSIS — G4733 Obstructive sleep apnea (adult) (pediatric): Secondary | ICD-10-CM | POA: Diagnosis not present

## 2022-11-10 DIAGNOSIS — I5032 Chronic diastolic (congestive) heart failure: Secondary | ICD-10-CM | POA: Diagnosis not present

## 2022-11-10 DIAGNOSIS — I6381 Other cerebral infarction due to occlusion or stenosis of small artery: Secondary | ICD-10-CM | POA: Diagnosis not present

## 2022-11-10 DIAGNOSIS — R351 Nocturia: Secondary | ICD-10-CM

## 2022-11-10 DIAGNOSIS — Z6841 Body Mass Index (BMI) 40.0 and over, adult: Secondary | ICD-10-CM

## 2022-11-10 DIAGNOSIS — R030 Elevated blood-pressure reading, without diagnosis of hypertension: Secondary | ICD-10-CM

## 2022-11-10 DIAGNOSIS — R634 Abnormal weight loss: Secondary | ICD-10-CM

## 2022-11-10 DIAGNOSIS — I4891 Unspecified atrial fibrillation: Secondary | ICD-10-CM | POA: Diagnosis not present

## 2022-11-10 NOTE — Progress Notes (Signed)
Subjective:    Patient ID: Lindsay Gomez, female    DOB: 1970/12/07, 52 y.o.   MRN: [id number]  HPI     Huston Foley, MD, PhD [facility] [address] [address] [place], [place] [address]  Dear Dr. Leavy Cella,   I saw your patient, Lindsay Gomez, upon your kind request in my sleep clinic today for initial consultation of her sleep disorder, in particular, evaluation of her prior diagnosis of obstructive sleep apnea.  The patient is unaccompanied today.  As you know, Lindsay Gomez is a 52 year old female with an underlying complex medical history of atrial fibrillation, congestive heart failure, thalamic stroke, hypertension, hyperlipidemia, allergies, anemia, anxiety, depression, diabetes, and morbid obesity with a BMI of over 45, who reports snoring and daytime somnolence, significant nocturia.  She was previously diagnosed with obstructive sleep apnea several years ago.  She did not pursue CPAP therapy at the time.  She was supposed to get a CPAP or AutoPap machine, she reports that she went to an education class to get her machine but then never did end up getting a machine.  She is not sure if she can tolerate something on her face at night but is willing to get retested.  She works as a Runner, broadcasting/film/video, she has to be at work at [employer] AM and usual workday ends at 2:30 PM.  Bedtime is generally between 11 and midnight and rise time between 5:30 AM and 6 AM.  Significant nocturia about 3-4 times per average night, denies recurrent nocturnal or morning headaches.  She is not aware of any family history of sleep apnea.  She is single and lives alone, she has 2 grown children.  No pets in the house.  She does have a TV in her bedroom and it does tend to stay on at night on low volume.  She does not drink caffeine daily.  She does not currently drink any alcohol.  She is a non-smoker.  She denies sleepiness at the wheel.  I was able to review her sleep study report from  11/13/2015.  She had an in lab study at [facility], which showed an AHI of 12.1/h, O2 nadir 84%.  She has been working on weight loss and has indeed lost weight compared to 2017.  Her weight was documented at 289 at the time.  I reviewed your office note from 05/20/2022.  Of note, her blood pressure upon initial triage was elevated at 178/96 and upon recheck at the end of the appointment it was still elevated at 176/121.  She denies any actual current symptoms such as chest pain or shortness of breath or chest flutter.  In reviewing her chart, she has had some highly elevated blood pressure values particularly earlier this year but also in July.  She reports that she is in the process of seeing a new cardiologist through [facility].   Her Past Medical History Is Significant For: Past Medical History:  Diagnosis Date   Allergy    rhinitis   Anemia    Anxiety    Atrial fibrillation (HCC)    CHF (congestive heart failure) (HCC)    Depression    Diabetes mellitus without complication (HCC)    glucose usually 140-150   Enlarged heart    Hyperlipidemia    Hypertension    Morbid obesity (HCC)    Shortness of breath dyspnea    with low iron   Sleep apnea  Stroke [facility])     Her Past Surgical History Is Significant For: Past Surgical History:  Procedure Laterality Date   ABDOMINAL HYSTERECTOMY N/A 01/29/2015   Procedure: TOTAL ABDOMINAL HYSTERECTOMY ;  Surgeon: Kirkland Hun, MD;  Location: [facility];  Service: Gynecology;  Laterality: N/A;   BILATERAL SALPINGECTOMY Bilateral 01/29/2015   Procedure: BILATERAL SALPINGECTOMY;  Surgeon: Kirkland Hun, MD;  Location: [facility];  Service: Gynecology;  Laterality: Bilateral;   CESAREAN SECTION     KNEE ARTHROSCOPY Right 03/16/1990    Her Family History Is Significant For: Family History  Problem Relation Age of Onset   Hypertension Mother    Diabetes Mother    Coronary artery disease Father 52   Heart failure Father     Heart attack Father    Diabetes Father    Multiple myeloma Father    Stroke Brother 62   Pancreatic cancer Brother    Ovarian cancer Paternal Grandmother    Dementia Paternal Grandmother    Heart disease Paternal Grandfather    Kidney disease Paternal Grandfather    Hypertension Other    Hyperlipidemia Other    Lupus Daughter    Diabetes Daughter    Anxiety disorder Daughter    Depression Daughter    Obesity Daughter    Obesity Daughter    Esophageal cancer Neg Hx    Colon cancer Neg Hx    Stomach cancer Neg Hx    Atrial fibrillation Neg Hx     Her Social History Is Significant For: Social History   Socioeconomic History   Marital status: Single    Spouse name: Not on file   Number of children: 2   Years of education: Not on file   Highest education level: Not on file  Occupational History   Occupation: Kindergarten Runner, broadcasting/film/video    Comment: Programmer, multimedia  Tobacco Use   Smoking status: Never   Smokeless tobacco: Never  Vaping Use   Vaping status: Never Used  Substance and Sexual Activity   Alcohol use: No   Drug use: No   Sexual activity: Not Currently    Birth control/protection: None  Other Topics Concern   Not on file  Social History Narrative   Pt lives alone in 2 story home   Has 2 children   Masters degree   Works as an Surveyor, quantity of Corporate investment banker Strain: Not on Ship broker Insecurity: Not on file  Transportation Needs: Not on file  Physical Activity: Not on file  Stress: Not on file  Social Connections: Not on file    Her Allergies Are:  Allergies  Allergen Reactions   Lisinopril Other (See Comments)    Cough    Metformin Hcl Other (See Comments)   Ozempic (0.25 Or 0.5 Mg-Dose) [Semaglutide(0.25 Or 0.5mg -Dos)] Other (See Comments)    Stomach pain   Trulicity [Dulaglutide] Other (See Comments)  :   Her Current Medications Are:  Outpatient Encounter Medications as of 11/10/2022  Medication Sig   bismuth  subsalicylate (PEPTO BISMOL) 262 MG chewable tablet Chew 524 mg by mouth as needed for indigestion or diarrhea or loose stools.   Continuous Glucose Receiver (FREESTYLE LIBRE 3 READER) DEVI 1 Device by Does not apply route continuous. USE TO CHECK BLOOD SUGAR   diltiazem (CARDIZEM CD) 120 MG 24 hr capsule Take 1 capsule by mouth daily.   ELIQUIS 5 MG TABS tablet Take 1 tablet (5 mg total) by mouth 2 (two) times daily.  rosuvastatin (CRESTOR) 20 MG tablet Take 1 tablet (20 mg total) by mouth at bedtime.   Tirzepatide [facility]) Inject into the skin once a week.   Continuous Glucose Sensor (FREESTYLE LIBRE 3 SENSOR) MISC 1 Device by Does not apply route every 14 (fourteen) days. Place 1 sensor on the skin every 14 days. Use to check glucose continuously (Patient not taking: Reported on 11/10/2022)   [DISCONTINUED] glimepiride (AMARYL) 1 MG tablet Take 1 tablet (1 mg total) by mouth daily with breakfast.   [DISCONTINUED] omeprazole (PRILOSEC) 20 MG capsule Take 1 capsule (20 mg total) by mouth daily. Take 30 minutes before breakfast.   No facility-administered encounter medications on file as of 11/10/2022.  :   Review of Systems:  Out of a complete 14 point review of systems, all are reviewed and negative with the exception of these symptoms as listed below:  Review of Systems  Neurological:        Rm 9. OSA, hypertension, obesity       Objective:   Physical Exam  Physical Examination:   Vitals:   11/10/22 1527  BP: (!) 178/96  Pulse: 80    General Examination: The patient is a very pleasant 52 y.o. female in no acute distress. She appears well-developed and well-nourished and well groomed.   HEENT: Normocephalic, atraumatic, pupils are equal, round and reactive to light, extraocular tracking is good without limitation to gaze excursion or nystagmus noted. Hearing is grossly intact. Face is symmetric with normal facial animation. Speech is clear with no dysarthria noted. There is  no hypophonia. There is no lip, neck/head, jaw or voice tremor. Neck is supple with full range of passive and active motion. There are no carotid bruits on auscultation. Oropharynx exam reveals: Mild mouth dryness, good dental hygiene, moderate airway crowding secondary to Mallampati class III, smaller airway entry.  Tonsils and tip of uvula not fully visualized.  Tongue protrudes centrally and palate elevates symmetrically, neck circumference 14-5/8 inches, minimal overbite noted.    Chest: Clear to auscultation without wheezing, rhonchi or crackles noted.  Heart: S1+S2+0, regular and normal without murmurs, rubs or gallops noted.   Abdomen: Soft, non-tender and non-distended.  Extremities: There is no pitting edema in the distal lower extremities bilaterally.   Skin: Warm and dry without trophic changes noted.   Musculoskeletal: exam reveals no obvious joint deformities.   Neurologically:  Mental status: The patient is awake, alert and oriented in all 4 spheres. Her immediate and remote memory, attention, language skills and fund of knowledge are appropriate. There is no evidence of aphasia, agnosia, apraxia or anomia. Speech is clear with normal prosody and enunciation. Thought process is linear. Mood is normal and affect is normal.  Cranial nerves II - XII are as described above under HEENT exam.  Motor exam: Normal bulk, strength and tone is noted. There is no obvious action or resting tremor.  Fine motor skills and coordination: grossly intact.  Cerebellar testing: No dysmetria or intention tremor. There is no truncal or gait ataxia.  Sensory exam: intact to light touch in the upper and lower extremities.  Gait, station and balance: She stands easily. No veering to one side is noted. No leaning to one side is noted. Posture is age-appropriate and stance is narrow based. Gait shows normal stride length and normal pace. No problems turning are noted.      Assessment & Plan:  In summary,  Lindsay Gomez is a very pleasant 52 y.o.-year old female with  an underlying complex medical history of atrial fibrillation, congestive heart failure, thalamic stroke, hypertension, hyperlipidemia, allergies, anemia, anxiety, depression, diabetes, and morbid obesity with a BMI of over 45, whose history and physical exam are concerning for sleep disordered breathing, particularly obstructive sleep apnea (OSA). A laboratory attended sleep study is typically considered "gold standard" for evaluation of sleep disordered breathing.   I had a long chat with the patient about my findings and the diagnosis of sleep apnea, particularly OSA, its prognosis and treatment options. We talked about medical/conservative treatments, surgical interventions and non-pharmacological approaches for symptom control. I explained, in particular, the risks and ramifications of untreated moderate to severe OSA, especially with respect to developing cardiovascular disease down the road, including congestive heart failure (CHF), difficult to treat hypertension, cardiac arrhythmias (particularly A-fib), neurovascular complications including TIA, stroke and dementia. Even type 2 diabetes has, in part, been linked to untreated OSA. Symptoms of untreated OSA may include (but may not be limited to) daytime sleepiness, nocturia (i.e. frequent nighttime urination), memory problems, mood irritability and suboptimally controlled or worsening mood disorder such as depression and/or anxiety, lack of energy, lack of motivation, physical discomfort, as well as recurrent headaches, especially morning or nocturnal headaches. We talked about the importance of maintaining a healthy lifestyle and striving for healthy weight. In addition, we talked about the importance of striving for and maintaining good sleep hygiene. I recommended a sleep study at this time. I outlined the differences between a laboratory attended sleep study which is considered more  comprehensive and accurate over the option of a home sleep test (HST); the latter may lead to underestimation of sleep disordered breathing in some instances and does not help with diagnosing upper airway resistance syndrome and is not accurate enough to diagnose primary central sleep apnea typically. I outlined possible surgical and non-surgical treatment options of OSA, including the use of a positive airway pressure (PAP) device (i.e. CPAP, AutoPAP/APAP or BiPAP in certain circumstances), a custom-made dental device (aka oral appliance, which would require a referral to a specialist dentist or orthodontist typically, and is generally speaking not considered for patients with full dentures or edentulous state), upper airway surgical options, such as traditional UPPP (which is not considered a first-line treatment) or the Inspire device (hypoglossal nerve stimulator, which would involve a referral for consultation with an ENT surgeon, after careful selection, following inclusion criteria - also not first-line treatment). I explained the PAP treatment option to the patient in detail, as this is generally considered first-line treatment.  The patient indicated that she would be willing to try PAP therapy, if the need arises. I explained the importance of being compliant with PAP treatment, not only for insurance purposes but primarily to improve patient's symptoms symptoms, and for the patient's long term health benefit, including to reduce Her cardiovascular risks longer-term.    We will pick up our discussion about the next steps and treatment options after testing.  We will keep her posted as to the test results by phone call and/or MyChart messaging where possible.  We will plan to follow-up in sleep clinic accordingly as well.  I answered all her questions today and the patient was in agreement.   I encouraged her to call with any interim questions, concerns, problems or updates or email [place] through MyChart.   Generally speaking, sleep test authorizations may take up to 2 weeks, sometimes less, sometimes longer, the patient is encouraged to get in touch with [place] if they do not hear  back from the sleep lab staff directly within the next 2 weeks.  Thank you very much for allowing me to participate in the care of this nice patient. If I can be of any further assistance to you please do not hesitate to call me at [phone].  Sincerely,   Huston Foley, MD, PhD

## 2022-11-12 ENCOUNTER — Telehealth: Payer: Self-pay | Admitting: Neurology

## 2022-11-12 NOTE — Telephone Encounter (Signed)
 [place]- BCBS state no auth req.  Sent [employer].

## 2022-12-07 ENCOUNTER — Telehealth: Payer: Self-pay | Admitting: Nurse Practitioner

## 2022-12-07 ENCOUNTER — Telehealth: Payer: Self-pay

## 2022-12-07 NOTE — Telephone Encounter (Signed)
Reminder received in Epic. Chart reviewed. Phone note also received on Pt from Alcide Evener NP regarding clearance. Documentation in other phone note.

## 2022-12-07 NOTE — Telephone Encounter (Signed)
Lindsay Gomez, refer to office visit 10/12/2022. Patient was previously instructed to contact our office with the name of her new cardiologist, previously saw cardiologist Dr. Odis Hollingshead. Pls contact patient and obtain the name of her new cardiologist and send an official request for a cardiac clearance to also include Eliquis hold instructions. Cardiac clearance required prior to scheduling EGD and colonoscopy at [facility]. THX.

## 2022-12-07 NOTE — Telephone Encounter (Signed)
Pt stated that she recently seen Dr. Flonnie Hailstone. Clearance was faxed to Dr. Flonnie Hailstone at [phone]

## 2022-12-07 NOTE — Telephone Encounter (Signed)
-----   Message from Nurse Joselyn Glassman sent at 10/21/2022 11:00 AM EDT ----- Personal reminder sent on 10/21/2022  Pt stated that her Cardiologist was Dr. Wilkie Aye. Pt stated that she has her first appointment to see him on 12/04/2022. Pt was notified that after she sees her cardiologist to please call our office as soon as possible so that we can get pt scheduled for her procedures and get clearance for her cardiologist.

## 2022-12-11 ENCOUNTER — Other Ambulatory Visit: Payer: Self-pay

## 2022-12-11 DIAGNOSIS — R194 Change in bowel habit: Secondary | ICD-10-CM

## 2022-12-11 DIAGNOSIS — K921 Melena: Secondary | ICD-10-CM

## 2022-12-11 DIAGNOSIS — R159 Full incontinence of feces: Secondary | ICD-10-CM

## 2022-12-11 DIAGNOSIS — R1310 Dysphagia, unspecified: Secondary | ICD-10-CM

## 2022-12-11 DIAGNOSIS — R197 Diarrhea, unspecified: Secondary | ICD-10-CM

## 2022-12-11 MED ORDER — SUTAB 1479-225-188 MG PO TABS: ORAL_TABLET | ORAL | 0 refills | Status: DC

## 2022-12-11 MED ORDER — ONDANSETRON HCL 4 MG PO TABS: ORAL_TABLET | ORAL | 0 refills | Status: DC

## 2022-12-11 NOTE — Telephone Encounter (Signed)
Notify that SuTabs can produce as many or more problems with N/V. Use Zofran 4 mg po 30 minutes prior to both prep doses.

## 2022-12-11 NOTE — Telephone Encounter (Signed)
Spoke with patient & discussed cardiac clearance and eliquis hold. Scheduled endo colon for 03/08/23 at 7:30 am at [facility] with Dr. Russella Dar. Sooner dates were offered for patient, but declined d/t conflict in her schedule. Amb ref placed. She is also on [employer]. She is requesting sutab d/t nausea & vomiting with previous preps. Will confirm with MD on this prep choice & get back to her along with written instructions for procedure. Pt verbalized all understanding.

## 2022-12-11 NOTE — Telephone Encounter (Signed)
Spoke with patient regarding MD recommendations. She would still prefer to do Subtab. Prescription sent for both prep & Zofran. Instructions sent to patient in mychart & she's been advised to call back with any further questions. Pt verbalized all understanding.

## 2022-12-11 NOTE — Telephone Encounter (Signed)
Further cardiac clearance is given in cardiologist OV note 12/02/22.

## 2022-12-11 NOTE — Telephone Encounter (Signed)
Received fax from Dr. Flonnie Hailstone stating that it is okay for patient to hold Eliquis prior to EGD/Colonoscopy.

## 2022-12-15 ENCOUNTER — Ambulatory Visit: Payer: BC Managed Care – PPO | Admitting: Cardiology

## 2022-12-30 ENCOUNTER — Ambulatory Visit (INDEPENDENT_AMBULATORY_CARE_PROVIDER_SITE_OTHER): Payer: BC Managed Care – PPO | Admitting: Internal Medicine

## 2022-12-30 ENCOUNTER — Encounter: Payer: Self-pay | Admitting: Internal Medicine

## 2022-12-30 ENCOUNTER — Other Ambulatory Visit: Payer: Self-pay

## 2022-12-30 VITALS — BP 120/84 | HR 82 | Ht 62.0 in | Wt 268.0 lb

## 2022-12-30 DIAGNOSIS — E1159 Type 2 diabetes mellitus with other circulatory complications: Secondary | ICD-10-CM

## 2022-12-30 DIAGNOSIS — Z794 Long term (current) use of insulin: Secondary | ICD-10-CM | POA: Diagnosis not present

## 2022-12-30 DIAGNOSIS — E1142 Type 2 diabetes mellitus with diabetic polyneuropathy: Secondary | ICD-10-CM | POA: Diagnosis not present

## 2022-12-30 DIAGNOSIS — E1165 Type 2 diabetes mellitus with hyperglycemia: Secondary | ICD-10-CM

## 2022-12-30 LAB — POCT GLYCOSYLATED HEMOGLOBIN (HGB A1C): Hemoglobin A1C: 13 % — AB (ref 4.0–5.6)

## 2022-12-30 LAB — POCT GLUCOSE (DEVICE FOR HOME USE): POC Glucose: 355 mg/dL — AB (ref 70–99)

## 2022-12-30 MED ORDER — FREESTYLE LIBRE 2 SENSOR MISC: 3 refills | Status: DC

## 2022-12-30 MED ORDER — NOVOLOG FLEXPEN 100 UNIT/ML ~~LOC~~ SOPN: PEN_INJECTOR | SUBCUTANEOUS | 3 refills | Status: DC

## 2022-12-30 MED ORDER — INSULIN PEN NEEDLE 32G X 4 MM MISC: 1.0000 | Freq: Four times a day (QID) | 3 refills | Status: DC

## 2022-12-30 NOTE — Patient Instructions (Signed)
Tresiba  32 units once daily  Novolog/Humalog 10 units with  Novolog correctional insulin: ADD extra units on insulin to your meal-time Novolog dose if your blood sugars are higher than 160. Use the scale below to help guide you:   Blood sugar before meal Number of units to inject  Less than 160 0 unit  161 -  190 1 units  191 -  220 2 units  221 -  250 3 units  251 -  280 4 units  281 -  310 5 units  311 -  340 6 units  341 -  370 7 units  371 -  400 8 units  401 - 430 9 units     HOW TO TREAT LOW BLOOD SUGARS (Blood sugar LESS THAN 70 MG/DL) Please follow the RULE OF 15 for the treatment of hypoglycemia treatment (when your (blood sugars are less than 70 mg/dL)   STEP 1: Take 15 grams of carbohydrates when your blood sugar is low, which includes:  3-4 GLUCOSE TABS  OR 3-4 OZ OF JUICE OR REGULAR SODA OR ONE TUBE OF GLUCOSE GEL    STEP 2: RECHECK blood sugar in 15 MINUTES STEP 3: If your blood sugar is still low at the 15 minute recheck --> then, go back to STEP 1 and treat AGAIN with another 15 grams of carbohydrates.

## 2022-12-30 NOTE — Progress Notes (Unsigned)
Name: Lindsay Gomez  Age/ Sex: 52 y.o., female   MRN/ DOB: [id number], 27-Mar-1970     PCP: Verlon Au, MD   Reason for Endocrinology Evaluation: Type 2 Diabetes Mellitus  Initial Endocrine Consultative Visit: 01/07/18    PATIENT IDENTIFIER: [name] is a 52 y.o. female with a past medical history of HTN, Obesity,CVA, OSA not on treatment and T2DM . The patient has followed with Endocrinology clinic since 01/07/18 for consultative assistance with management of her diabetes.  DIABETIC HISTORY:  Lindsay Gomez was diagnosed with T2DM in 2016,She is intolerant to Metformin.On her initial visit to our clinic she was on Basaglar, glipizide and Trulicity, she did admit to noncompliance with her meds, she would take basaglar once a month. Her hemoglobin A1c has ranged from  6.3% in 2014, peaking at 14.1% in 2018  Glimepiride started 06/2022 with an A1c of 9.2%   SUBJECTIVE:   During the last visit (07/01/2022): A1c 9.2 %     Today (12/30/2022): Lindsay Gomez is here for a follow up on diabetes management. She has not had used the sensor in 1 week.    Patient has been following up with GI for melena, scheduled for colonoscopy 03/08/2023  She had a follow-up with her PCP 12/17/2022, she was having dizziness, and her PCPs note it was mentioned that the patient is on regular insulin U-500?  And tresiba ? It was also noted that she could not tolerate Mounjaro    She was seen by cardiology for clearance 11/2022  Today she states she is on [place] but not U-500 Ozempic was causing abdominal pain and diarrhea   Has noted polyuria but no polydipsia    HOME DIABETES REGIMEN:  Humulin-R  Tresiba 28 units a day    GLUCOSE METER : n/a   DIABETIC COMPLICATIONS: Microvascular complications:  Neuropathy Denies:  Retinopathy, nephropathy Last eye exam: Completed 05/2022     Macrovascular complications:  CVA (02/2017) Denies: CAD, PVD       HISTORY:  Past  Medical History:  Past Medical History:  Diagnosis Date   Allergy    rhinitis   Anemia    Anxiety    Atrial fibrillation (HCC)    CHF (congestive heart failure) (HCC)    Depression    Diabetes mellitus without complication (HCC)    glucose usually 140-150   Enlarged heart    Hyperlipidemia    Hypertension    Morbid obesity (HCC)    Shortness of breath dyspnea    with low iron   Sleep apnea    Stroke [facility])    Past Surgical History:  Past Surgical History:  Procedure Laterality Date   ABDOMINAL HYSTERECTOMY N/A 01/29/2015   Procedure: TOTAL ABDOMINAL HYSTERECTOMY ;  Surgeon: Kirkland Hun, MD;  Location: [facility];  Service: Gynecology;  Laterality: N/A;   BILATERAL SALPINGECTOMY Bilateral 01/29/2015   Procedure: BILATERAL SALPINGECTOMY;  Surgeon: Kirkland Hun, MD;  Location: [facility];  Service: Gynecology;  Laterality: Bilateral;   CESAREAN SECTION     KNEE ARTHROSCOPY Right 03/16/1990   Social History:  reports that she has never smoked. She has never used smokeless tobacco. She reports that she does not drink alcohol and does not use drugs. Family History:  Family History  Problem Relation Age of Onset   Hypertension Mother    Diabetes Mother    Coronary artery disease Father 60   Heart failure Father    Heart attack Father    Diabetes Father  Multiple myeloma Father    Stroke Brother 51   Pancreatic cancer Brother    Ovarian cancer Paternal Grandmother    Dementia Paternal Grandmother    Heart disease Paternal Grandfather    Kidney disease Paternal Grandfather    Hypertension Other    Hyperlipidemia Other    Lupus Daughter    Diabetes Daughter    Anxiety disorder Daughter    Depression Daughter    Obesity Daughter    Obesity Daughter    Esophageal cancer Neg Hx    Colon cancer Neg Hx    Stomach cancer Neg Hx    Atrial fibrillation Neg Hx      HOME MEDICATIONS: Allergies as of 12/30/2022       Reactions   Lisinopril Other (See Comments)   Cough     Metformin Hcl Other (See Comments)   Ozempic (0.25 Or 0.5 Mg-dose) [semaglutide(0.25 Or 0.5mg -dos)] Other (See Comments)   Stomach pain   Trulicity [dulaglutide] Other (See Comments)        Medication List        Accurate as of December 30, 2022  7:14 AM. If you have any questions, ask your nurse or doctor.          bismuth subsalicylate 262 MG chewable tablet Commonly known as: PEPTO BISMOL Chew 524 mg by mouth as needed for indigestion or diarrhea or loose stools.   diltiazem 120 MG 24 hr capsule Commonly known as: CARDIZEM CD Take 1 capsule by mouth daily.   Eliquis 5 MG Tabs tablet Generic drug: apixaban Take 1 tablet (5 mg total) by mouth 2 (two) times daily.   FreeStyle Libre 3 Reader Devi 1 Device by Does not apply route continuous. USE TO CHECK BLOOD SUGAR   FreeStyle Libre 3 Sensor Misc 1 Device by Does not apply route every 14 (fourteen) days. Place 1 sensor on the skin every 14 days. Use to check glucose continuously   [facility] Inject into the skin once a week.   ondansetron 4 MG tablet Commonly known as: Zofran Take Zofran 4 mg by mouth 30 minutes prior to each prep dose.   rosuvastatin 20 MG tablet Commonly known as: CRESTOR Take 1 tablet (20 mg total) by mouth at bedtime.   Sutab [phone] MG Tabs Generic drug: Sodium Sulfate-Mag Sulfate-KCl Use as directed for colonoscopy.         OBJECTIVE:   Vital Signs: LMP 12/18/2014 (Exact Date)   Wt Readings from Last 3 Encounters:  11/10/22 272 lb (123.4 kg)  10/12/22 269 lb 4 oz (122.1 kg)  07/01/22 273 lb (123.8 kg)     Exam: General: Pt appears well and is in NAD  Lungs: Clear with good BS bilat   Heart: RRR   Abdomen: soft, nontender, without masses or organomegaly palpable  Extremities: No  pretibial edema.    Neuro: MS is good with appropriate affect, pt is alert and Ox3   DM foot exam 07/01/2022 The skin of the feet is intact without sores or ulcerations. The pedal pulses  are 2+ on right and 2+ on left. The sensation is intact to a screening 5.07, 10 gram monofilament bilaterally      DATA REVIEWED: 05/20/2022 GFR > 90 CR 0.7 BUN 11   Old records , labs and images have been reviewed.    ASSESSMENT / PLAN / RECOMMENDATIONS:   1) Type 2 Diabetes Mellitus, Poorly Controlled , With neuropathic  and macrovascular complications - Most recent A1c of 13.0 %.  Goal A1c < 7.0 %.    -Patient continues with hyperglycemia -Intolerant to Ozempic due to diarrhea -Intolerant to [facility] -Has started on glimepiride, but she self discontinued and was started on Tresiba and Humulin U-500 to her PCPs office. -She has not started Humulin U-500, which I have advised her against using that for now, we will put her on regular prandial insulin -She continues not to have any glucose data available -Main barriers to diabetes care is depression.  I have encouraged her to address this with her PCP   MEDICATIONS: Tresiba 32 units daily NovoLog 10 units 3 times daily before every meal Correction factor: NovoLog (BG -130/30) TIDQAC   EDUCATION / INSTRUCTIONS: BG monitoring instructions: Patient is instructed to check her blood sugars 3 times a day, before meals  2) Diabetic complications:  Eye:She does not have known diabetic retinopathy. Pt urged to schedule an eye exam again Neuro/ Feet: Does have known diabetic peripheral neuropathy. Renal: Patient does not have known baseline CKD. She is on on an ACEI/ARB at present.     Follow-up in 3 months  I spent 35 minutes preparing to see the patient by review of recent labs, imaging and procedures, obtaining and reviewing separately obtained history, communicating with the patient/family or caregiver, ordering medications, tests or procedures, and documenting clinical information in the EHR including the differential Dx, treatment, and any further evaluation and other management    Signed electronically by: Lyndle Herrlich, MD  [facility] Endocrinology  [facility] [address] [place], [place] [address] Phone: [phone] FAX: [phone]   CC: Verlon Au, MD [address] [place] [address] Phone: [phone]  Fax: [phone]  Return to Endocrinology clinic as below: Future Appointments  Date Time Provider Department Center  12/30/2022 10:30 AM Ariani Seier, Konrad Dolores, MD [facility] None  04/04/2023  9:00 PM [facility] None

## 2023-01-01 ENCOUNTER — Ambulatory Visit: Payer: BC Managed Care – PPO | Admitting: Internal Medicine

## 2023-01-01 ENCOUNTER — Encounter: Payer: Self-pay | Admitting: Internal Medicine

## 2023-02-04 ENCOUNTER — Ambulatory Visit (INDEPENDENT_AMBULATORY_CARE_PROVIDER_SITE_OTHER): Payer: BC Managed Care – PPO | Admitting: Internal Medicine

## 2023-02-04 ENCOUNTER — Encounter: Payer: Self-pay | Admitting: Internal Medicine

## 2023-02-04 VITALS — BP 124/76 | HR 70 | Ht 62.0 in | Wt 271.0 lb

## 2023-02-04 DIAGNOSIS — E876 Hypokalemia: Secondary | ICD-10-CM

## 2023-02-04 DIAGNOSIS — E1142 Type 2 diabetes mellitus with diabetic polyneuropathy: Secondary | ICD-10-CM

## 2023-02-04 DIAGNOSIS — Z794 Long term (current) use of insulin: Secondary | ICD-10-CM

## 2023-02-04 DIAGNOSIS — E1165 Type 2 diabetes mellitus with hyperglycemia: Secondary | ICD-10-CM | POA: Diagnosis not present

## 2023-02-04 LAB — POTASSIUM: Potassium: 3.3 meq/L — ABNORMAL LOW (ref 3.5–5.1)

## 2023-02-04 LAB — CORTISOL: Cortisol, Plasma: 5.2 ug/dL

## 2023-02-04 MED ORDER — NOVOLIN N FLEXPEN 100 UNIT/ML ~~LOC~~ SUPN: 16.0000 [IU] | PEN_INJECTOR | Freq: Two times a day (BID) | SUBCUTANEOUS | 4 refills | Status: DC

## 2023-02-04 NOTE — Progress Notes (Addendum)
Name: Lindsay Gomez  Age/ Sex: 52 y.o., female   MRN/ DOB: [id number], 09-11-1970     PCP: Verlon Au, MD   Reason for Endocrinology Evaluation: Type 2 Diabetes Mellitus  Initial Endocrine Consultative Visit: 01/07/18    PATIENT IDENTIFIER: Lindsay Gomez is a 52 y.o. female with a past medical history of HTN, Obesity,CVA, OSA not on treatment and T2DM . The patient has followed with Endocrinology clinic since 01/07/18 for consultative assistance with management of her diabetes.  DIABETIC HISTORY:  Ms. Mosher was diagnosed with T2DM in 2016,She is intolerant to Metformin.On her initial visit to our clinic she was on Basaglar, glipizide and Trulicity, she did admit to noncompliance with her meds, she would take basaglar once a month. Her hemoglobin A1c has ranged from  6.3% in 2014, peaking at 14.1% in 2018  Glimepiride started 06/2022 with an A1c of 9.2%   Patient was started on basal insulin by her PCP 11/2022 with an A1c of 11.8% I started her on NovoLog 12/2022 with an A1c of 13.0%  She endorsed GI side effects with Ozempic  She endorsed GI side effects with Evaristo Bury and Lantus   SUBJECTIVE:   During the last visit (12/30/2022): A1c 13.0 %    Today (02/04/2023): Ms. Prucha is here for a follow up on diabetes management.    Patient presents with abdominal cramps and diarrhea that she attributes to [place]  Last Evaristo Bury 2 weeks ago  She had an ED visit for chest pain, work up negative , she was given Lantus in the hospital which resulted in diarrhea and abdominal cramps.  Patient has been noted with hypokalemia during hospitalization   She is scheduled for colonoscopy 03/08/2023  Denies nausea or vomiting    HOME DIABETES REGIMEN:  Novolog 10 units TIDQAC  Tresiba 32 units a day - not taking  CF: Novolog (BG-130/30) TIDQAC    GLUCOSE METER : n/a   DIABETIC COMPLICATIONS: Microvascular complications:  Neuropathy Denies:  Retinopathy,  nephropathy Last eye exam: Completed 05/2022     Macrovascular complications:  CVA (02/2017) Denies: CAD, PVD       HISTORY:  Past Medical History:  Past Medical History:  Diagnosis Date   Allergy    rhinitis   Anemia    Anxiety    Atrial fibrillation (HCC)    CHF (congestive heart failure) (HCC)    Depression    Diabetes mellitus without complication (HCC)    glucose usually 140-150   Enlarged heart    Hyperlipidemia    Hypertension    Morbid obesity (HCC)    Shortness of breath dyspnea    with low iron   Sleep apnea    Stroke [facility])    Past Surgical History:  Past Surgical History:  Procedure Laterality Date   ABDOMINAL HYSTERECTOMY N/A 01/29/2015   Procedure: TOTAL ABDOMINAL HYSTERECTOMY ;  Surgeon: Kirkland Hun, MD;  Location: [facility];  Service: Gynecology;  Laterality: N/A;   BILATERAL SALPINGECTOMY Bilateral 01/29/2015   Procedure: BILATERAL SALPINGECTOMY;  Surgeon: Kirkland Hun, MD;  Location: [facility];  Service: Gynecology;  Laterality: Bilateral;   CESAREAN SECTION     KNEE ARTHROSCOPY Right 03/16/1990   Social History:  reports that she has never smoked. She has never used smokeless tobacco. She reports that she does not drink alcohol and does not use drugs. Family History:  Family History  Problem Relation Age of Onset   Hypertension Mother    Diabetes Mother  Coronary artery disease Father 19   Heart failure Father    Heart attack Father    Diabetes Father    Multiple myeloma Father    Stroke Brother 87   Pancreatic cancer Brother    Ovarian cancer Paternal Grandmother    Dementia Paternal Grandmother    Heart disease Paternal Grandfather    Kidney disease Paternal Grandfather    Hypertension Other    Hyperlipidemia Other    Lupus Daughter    Diabetes Daughter    Anxiety disorder Daughter    Depression Daughter    Obesity Daughter    Obesity Daughter    Esophageal cancer Neg Hx    Colon cancer Neg Hx    Stomach cancer Neg Hx     Atrial fibrillation Neg Hx      HOME MEDICATIONS: Allergies as of 02/04/2023       Reactions   Lisinopril Other (See Comments)   Cough    Metformin Hcl Other (See Comments)   Ozempic (0.25 Or 0.5 Mg-dose) [semaglutide(0.25 Or 0.5mg -dos)] Other (See Comments)   Stomach pain   Trulicity [dulaglutide] Other (See Comments)        Medication List        Accurate as of February 04, 2023 11:37 AM. If you have any questions, ask your nurse or doctor.          Azilsartan-Chlorthalidone 40-25 MG Tabs Take 1 tablet by mouth daily.   bismuth subsalicylate 262 MG chewable tablet Commonly known as: PEPTO BISMOL Chew 524 mg by mouth as needed for indigestion or diarrhea or loose stools.   diltiazem 120 MG 24 hr capsule Commonly known as: CARDIZEM CD Take 1 capsule by mouth daily.   Eliquis 5 MG Tabs tablet Generic drug: apixaban Take 1 tablet (5 mg total) by mouth 2 (two) times daily.   FreeStyle Libre 3 Reader Devi 1 Device by Does not apply route continuous. USE TO CHECK BLOOD SUGAR   FreeStyle Libre 3 Sensor Misc 1 Device by Does not apply route every 14 (fourteen) days. Place 1 sensor on the skin every 14 days. Use to check glucose continuously   FreeStyle Libre 2 Sensor Misc Change every 14 days   Insulin Pen Needle 32G X 4 MM Misc 1 Device by Does not apply route in the morning, at noon, in the evening, and at bedtime.   NovoLOG FlexPen 100 UNIT/ML FlexPen Generic drug: insulin aspart Max daily 45 units   ondansetron 4 MG tablet Commonly known as: Zofran Take Zofran 4 mg by mouth 30 minutes prior to each prep dose.   rosuvastatin 20 MG tablet Commonly known as: CRESTOR Take 1 tablet (20 mg total) by mouth at bedtime.   Sutab [phone] MG Tabs Generic drug: Sodium Sulfate-Mag Sulfate-KCl Use as directed for colonoscopy.   Evaristo Bury FlexTouch 100 UNIT/ML FlexTouch Pen Generic drug: insulin degludec Inject 28 Units into the skin daily.   triamcinolone  cream 0.5 % Commonly known as: KENALOG Apply topically 3 (three) times daily.         OBJECTIVE:   Vital Signs: BP 124/76 (BP Location: Left Arm, Patient Position: Sitting, Cuff Size: Large)   Pulse 70   Ht 5\' 2"  (1.575 m)   Wt 271 lb (122.9 kg)   LMP 12/18/2014 (Exact Date)   SpO2 96%   BMI 49.57 kg/m   Wt Readings from Last 3 Encounters:  02/04/23 271 lb (122.9 kg)  12/30/22 268 lb (121.6 kg)  11/10/22 272 lb (123.4 kg)  Exam: General: Pt appears well and is in NAD  Lungs: Clear with good BS bilat   Heart: RRR   Abdomen: soft, nontender, without masses or organomegaly palpable  Extremities: No  pretibial edema.    Neuro: MS is good with appropriate affect, pt is alert and Ox3   DM foot exam 07/01/2022 The skin of the feet is intact without sores or ulcerations. The pedal pulses are 2+ on right and 2+ on left. The sensation is intact to a screening 5.07, 10 gram monofilament bilaterally      DATA REVIEWED:  Latest Reference Range & Units 10/19/22 16:17  Sodium 135 - 145 mEq/L 136  Potassium 3.5 - 5.1 mEq/L 3.6  Chloride 96 - 112 mEq/L 95 (L)  CO2 19 - 32 mEq/L 31  Glucose 70 - 99 mg/dL 161 (H)  BUN 6 - 23 mg/dL 11  Creatinine 0.96 - 0.45 mg/dL 4.09  Calcium 8.4 - 81.1 mg/dL 9.3  Alkaline Phosphatase 39 - 117 U/L 98  Albumin 3.5 - 5.2 g/dL 4.2  AST 0 - 37 U/L 28  ALT 0 - 35 U/L 25  Total Protein 6.0 - 8.3 g/dL 8.0  Total Bilirubin 0.2 - 1.2 mg/dL 0.4  GFR >91.47 mL/min 84.81    Old records , labs and images have been reviewed.    ASSESSMENT / PLAN / RECOMMENDATIONS:   1) Type 2 Diabetes Mellitus, Poorly Controlled , With neuropathic  and macrovascular complications - Most recent A1c of 13.0 %. Goal A1c < 7.0 %.     -Patient continues with hyperglycemia -Intolerant to Ozempic due to diarrhea -Intolerant to [facility] -She self discontinued glimepiride -Patient attributes abdominal cramps and diarrhea to [place] and Lantus, limited options here,  but I have recommended NPH -I will also increase her prandial insulin and adjust her sensitivity factor from 30 to 25   MEDICATIONS: Stop Tresiba 32 units daily Start Novolin-N 16 units twice daily Increase NovoLog 12 units 3 times daily before every meal Correction factor: NovoLog (BG -130/25) TIDQAC   EDUCATION / INSTRUCTIONS: BG monitoring instructions: Patient is instructed to check her blood sugars 3 times a day, before meals  2) Diabetic complications:  Eye:She does not have known diabetic retinopathy. Pt urged to schedule an eye exam again Neuro/ Feet: Does have known diabetic peripheral neuropathy. Renal: Patient does not have known baseline CKD. She is on on an ACEI/ARB at present.   3) Hypokalemia :  -Multifactorial given chlorthalidone and recent diarrhea -Patient would like to remain on chlorthalidone as this has been the only thing controlling her BP -I will proceed with renin, Aldo checkup -  We did discuss screening for Cushing syndrome with 24-hour urine collection versus dexamethasone suppression test.  Patient prefers dexamethasone suppression test, but I am reluctant to give her dexamethasone in the setting of hyperglycemia, we have opted to revisit this in the future -Repeat potassium continues to be low  Medication Start KCl 20 mEq  daily    Follow-up in 3 months    Signed electronically by: Lyndle Herrlich, MD  [facility] Endocrinology  [facility] [address] [place] [place], [place] [address] Phone: [phone] FAX: [phone]   CC: Verlon Au, MD [address] [place] [address] Phone: [phone]  Fax: [phone]  Return to Endocrinology clinic as below: Future Appointments  Date Time Provider Department Center  04/04/2023  9:00 PM [facility] None  05/05/2023  2:40 PM Ednah Hammock,  Konrad Dolores, MD [facility] None

## 2023-02-04 NOTE — Patient Instructions (Addendum)
STOP Tresiba  Start Novolin-N 16 units every morning and at bedtime  Increase Novolog 12 units with  Novolog correctional insulin: ADD extra units on insulin to your meal-time Novolog dose if your blood sugars are higher than 160. Use the scale below to help guide you:   Blood sugar before meal Number of units to inject  Less than 155 0 unit  156 - 180 1 units  181 - 205 2 units  206 - 230 3 units  231 - 255 4 units  256 - 280 5 units  281 - 305 6 units  306 - 330 7 units  331- 355 8 units  356 - 380 9 units   381 - 405 10 units     HOW TO TREAT LOW BLOOD SUGARS (Blood sugar LESS THAN 70 MG/DL) Please follow the RULE OF 15 for the treatment of hypoglycemia treatment (when your (blood sugars are less than 70 mg/dL)   STEP 1: Take 15 grams of carbohydrates when your blood sugar is low, which includes:  3-4 GLUCOSE TABS  OR 3-4 OZ OF JUICE OR REGULAR SODA OR ONE TUBE OF GLUCOSE GEL    STEP 2: RECHECK blood sugar in 15 MINUTES STEP 3: If your blood sugar is still low at the 15 minute recheck --> then, go back to STEP 1 and treat AGAIN with another 15 grams of carbohydrates.

## 2023-02-05 MED ORDER — POTASSIUM CHLORIDE CRYS ER 20 MEQ PO TBCR: 20.0000 meq | EXTENDED_RELEASE_TABLET | Freq: Every day | ORAL | 1 refills | Status: DC

## 2023-02-05 NOTE — Addendum Note (Signed)
Addended by: Scarlette Shorts on: 02/05/2023 10:01 AM   Modules accepted: Orders

## 2023-02-08 ENCOUNTER — Encounter: Payer: Self-pay | Admitting: Internal Medicine

## 2023-02-10 LAB — ALDOSTERONE + RENIN ACTIVITY W/ RATIO
Aldos/Renin Ratio: 2.4 (ref 0.0–30.0)
Aldosterone: 6.4 ng/dL (ref 0.0–30.0)
Renin Activity, Plasma: 2.699 ng/mL/h (ref 0.167–5.380)

## 2023-02-10 LAB — ACTH: ACTH: 18.6 pg/mL (ref 7.2–63.3)

## 2023-02-22 ENCOUNTER — Encounter: Payer: Self-pay | Admitting: Gastroenterology

## 2023-03-01 ENCOUNTER — Encounter (HOSPITAL_COMMUNITY): Payer: Self-pay | Admitting: Gastroenterology

## 2023-03-08 ENCOUNTER — Encounter (HOSPITAL_COMMUNITY): Payer: Self-pay | Admitting: Gastroenterology

## 2023-03-08 ENCOUNTER — Other Ambulatory Visit: Payer: Self-pay

## 2023-03-08 ENCOUNTER — Ambulatory Visit (HOSPITAL_COMMUNITY)
Admission: RE | Admit: 2023-03-08 | Discharge: 2023-03-08 | Disposition: A | Discharge status: Home / Self Care | Payer: BC Managed Care – PPO | Attending: Gastroenterology | Admitting: Gastroenterology

## 2023-03-08 ENCOUNTER — Ambulatory Visit (HOSPITAL_COMMUNITY): Payer: BC Managed Care – PPO | Admitting: Certified Registered Nurse Anesthetist

## 2023-03-08 ENCOUNTER — Encounter (HOSPITAL_COMMUNITY)
Admission: RE | Disposition: A | Discharge status: Home / Self Care | Payer: Self-pay | Source: Home / Self Care | Attending: Gastroenterology

## 2023-03-08 DIAGNOSIS — I4891 Unspecified atrial fibrillation: Secondary | ICD-10-CM | POA: Diagnosis not present

## 2023-03-08 DIAGNOSIS — G4730 Sleep apnea, unspecified: Secondary | ICD-10-CM | POA: Diagnosis not present

## 2023-03-08 DIAGNOSIS — R194 Change in bowel habit: Secondary | ICD-10-CM

## 2023-03-08 DIAGNOSIS — R1030 Lower abdominal pain, unspecified: Secondary | ICD-10-CM | POA: Diagnosis present

## 2023-03-08 DIAGNOSIS — Z8673 Personal history of transient ischemic attack (TIA), and cerebral infarction without residual deficits: Secondary | ICD-10-CM | POA: Insufficient documentation

## 2023-03-08 DIAGNOSIS — R195 Other fecal abnormalities: Secondary | ICD-10-CM | POA: Insufficient documentation

## 2023-03-08 DIAGNOSIS — K921 Melena: Secondary | ICD-10-CM

## 2023-03-08 DIAGNOSIS — E119 Type 2 diabetes mellitus without complications: Secondary | ICD-10-CM | POA: Diagnosis not present

## 2023-03-08 DIAGNOSIS — I509 Heart failure, unspecified: Secondary | ICD-10-CM | POA: Diagnosis not present

## 2023-03-08 DIAGNOSIS — D125 Benign neoplasm of sigmoid colon: Secondary | ICD-10-CM | POA: Diagnosis not present

## 2023-03-08 DIAGNOSIS — K319 Disease of stomach and duodenum, unspecified: Secondary | ICD-10-CM | POA: Diagnosis not present

## 2023-03-08 DIAGNOSIS — I110 Hypertensive heart disease with heart failure: Secondary | ICD-10-CM | POA: Insufficient documentation

## 2023-03-08 DIAGNOSIS — R1310 Dysphagia, unspecified: Secondary | ICD-10-CM | POA: Diagnosis not present

## 2023-03-08 DIAGNOSIS — K2950 Unspecified chronic gastritis without bleeding: Secondary | ICD-10-CM | POA: Insufficient documentation

## 2023-03-08 DIAGNOSIS — Z6841 Body Mass Index (BMI) 40.0 and over, adult: Secondary | ICD-10-CM | POA: Diagnosis not present

## 2023-03-08 DIAGNOSIS — R197 Diarrhea, unspecified: Secondary | ICD-10-CM | POA: Diagnosis not present

## 2023-03-08 DIAGNOSIS — K3189 Other diseases of stomach and duodenum: Secondary | ICD-10-CM | POA: Insufficient documentation

## 2023-03-08 DIAGNOSIS — K449 Diaphragmatic hernia without obstruction or gangrene: Secondary | ICD-10-CM | POA: Diagnosis not present

## 2023-03-08 DIAGNOSIS — Z79899 Other long term (current) drug therapy: Secondary | ICD-10-CM | POA: Diagnosis not present

## 2023-03-08 DIAGNOSIS — Z794 Long term (current) use of insulin: Secondary | ICD-10-CM | POA: Diagnosis not present

## 2023-03-08 DIAGNOSIS — K5730 Diverticulosis of large intestine without perforation or abscess without bleeding: Secondary | ICD-10-CM | POA: Diagnosis not present

## 2023-03-08 DIAGNOSIS — Z7901 Long term (current) use of anticoagulants: Secondary | ICD-10-CM | POA: Insufficient documentation

## 2023-03-08 HISTORY — PX: POLYPECTOMY: SHX5525

## 2023-03-08 HISTORY — PX: ESOPHAGOGASTRODUODENOSCOPY (EGD) WITH PROPOFOL: SHX5813

## 2023-03-08 HISTORY — PX: COLONOSCOPY WITH PROPOFOL: SHX5780

## 2023-03-08 HISTORY — PX: BIOPSY: SHX5522

## 2023-03-08 HISTORY — PX: SUBMUCOSAL TATTOO INJECTION: SHX6856

## 2023-03-08 LAB — GLUCOSE, CAPILLARY: Glucose-Capillary: 216 mg/dL — ABNORMAL HIGH (ref 70–99)

## 2023-03-08 SURGERY — COLONOSCOPY WITH PROPOFOL
Anesthesia: Monitor Anesthesia Care

## 2023-03-08 MED ORDER — PROPOFOL 1000 MG/100ML IV EMUL
INTRAVENOUS | Status: AC
  Filled 2023-03-08: qty 100

## 2023-03-08 MED ORDER — SPOT INK MARKER SYRINGE KIT
PACK | SUBMUCOSAL | Status: DC | PRN
  Administered 2023-03-08: 2 mL via SUBMUCOSAL

## 2023-03-08 MED ORDER — PROPOFOL 500 MG/50ML IV EMUL
INTRAVENOUS | Status: AC
  Filled 2023-03-08: qty 50

## 2023-03-08 MED ORDER — PROPOFOL 500 MG/50ML IV EMUL
INTRAVENOUS | Status: DC | PRN
  Administered 2023-03-08: 125 ug/kg/min via INTRAVENOUS

## 2023-03-08 MED ORDER — SODIUM CHLORIDE 0.9 % IV SOLN: INTRAVENOUS | Status: DC | PRN

## 2023-03-08 MED ORDER — PROPOFOL 10 MG/ML IV BOLUS
INTRAVENOUS | Status: DC | PRN
  Administered 2023-03-08: 100 mg via INTRAVENOUS
  Administered 2023-03-08: 20 mg via INTRAVENOUS
  Administered 2023-03-08: 10 mg via INTRAVENOUS

## 2023-03-08 SURGICAL SUPPLY — 24 items
BLOCK BITE 60FR ADLT L/F BLUE (MISCELLANEOUS) ×2 IMPLANT
ELECT REM PT RETURN 9FT ADLT (ELECTROSURGICAL)
ELECTRODE REM PT RTRN 9FT ADLT (ELECTROSURGICAL) IMPLANT
FCP BXJMBJMB 240X2.8X (CUTTING FORCEPS)
FLOOR PAD 36X40 (MISCELLANEOUS) ×2
FORCEP RJ3 GP 1.8X160 W-NEEDLE (CUTTING FORCEPS) IMPLANT
FORCEPS BIOP RAD 4 LRG CAP 4 (CUTTING FORCEPS) IMPLANT
FORCEPS BIOP RJ4 240 W/NDL (CUTTING FORCEPS)
FORCEPS BXJMBJMB 240X2.8X (CUTTING FORCEPS) IMPLANT
INJECTOR/SNARE I SNARE (MISCELLANEOUS) IMPLANT
LUBRICANT JELLY 4.5OZ STERILE (MISCELLANEOUS) IMPLANT
MANIFOLD NEPTUNE II (INSTRUMENTS) IMPLANT
NDL SCLEROTHERAPY 25GX240 (NEEDLE) IMPLANT
NEEDLE SCLEROTHERAPY 25GX240 (NEEDLE)
PAD FLOOR 36X40 (MISCELLANEOUS) ×2 IMPLANT
PROBE APC STR FIRE (PROBE) IMPLANT
PROBE INJECTION GOLD 7FR (MISCELLANEOUS) IMPLANT
SNARE ROTATE MED OVAL 20MM (MISCELLANEOUS) IMPLANT
SNARE SHORT THROW 13M SML OVAL (MISCELLANEOUS) IMPLANT
SYR 50ML LL SCALE MARK (SYRINGE) IMPLANT
TRAP SPECIMEN MUCOUS 40CC (MISCELLANEOUS) IMPLANT
TUBING ENDO SMARTCAP PENTAX (MISCELLANEOUS) ×4 IMPLANT
TUBING IRRIGATION ENDOGATOR (MISCELLANEOUS) ×2 IMPLANT
WATER STERILE IRR 1000ML POUR (IV SOLUTION) IMPLANT

## 2023-03-08 NOTE — Op Note (Signed)
[facility] Patient Name: Lindsay Gomez Procedure Date: 03/08/2023 MRN: [id number] Attending MD: Meryl Dare , MD, [phone] Date of Birth: 1970-05-31 CSN: [id number] Age: 52 Admit Type: Outpatient Procedure:                Upper GI endoscopy Indications:              Dysphagia, Melena Providers:                Venita Lick. Russella Dar, MD, Alan Ripper, Technician,                            Fransisca Connors, Rogue Jury, RN Referring MD:             Verlon Au, MD Medicines:                Monitored Anesthesia Care Complications:            No immediate complications. Estimated Blood Loss:     Estimated blood loss was minimal. Procedure:                Pre-Anesthesia Assessment:                           - Prior to the procedure, a History and Physical                            was performed, and patient medications and                            allergies were reviewed. The patient's tolerance of                            previous anesthesia was also reviewed. The risks                            and benefits of the procedure and the sedation                            options and risks were discussed with the patient.                            All questions were answered, and informed consent                            was obtained. Prior Anticoagulants: The patient has                            taken Eliquis (apixaban), last dose was 2 days                            prior to procedure. After reviewing the risks and                            benefits, the patient was deemed in satisfactory  condition to undergo the procedure.                           After obtaining informed consent, the endoscope was                            passed under direct vision. Throughout the                            procedure, the patient's blood pressure, pulse, and                            oxygen saturations were monitored continuously.  The                            [id number]-[id number] ([id number]) Olympus endoscope was introduced                            through the mouth, and advanced to the second part                            of duodenum. The upper GI endoscopy was                            accomplished without difficulty. The patient                            tolerated the procedure well. Scope In: Scope Out: Findings:      The examined esophagus was normal.      A small hiatal hernia was present.      The gastroesophageal flap valve was visualized endoscopically and       classified as Hill Grade II (fold present, opens with respiration).      Diffuse mild inflammation characterized by congestion (edema) and       granularity was found in the gastric fundus and in the gastric body.       Biopsies were taken with a cold forceps for histology.      The exam of the stomach was otherwise normal.      The duodenal bulb and second portion of the duodenum were normal. Impression:               - Normal esophagus.                           - Small hiatal hernia.                           - Gastroesophageal flap valve classified as Hill                            Grade II (fold present, opens with respiration).                           - Gastritis. Biopsied.                           -  Normal duodenal bulb and second portion of the                            duodenum. Moderate Sedation:      Not Applicable - Patient had care per Anesthesia. Recommendation:           - Patient has a contact number available for                            emergencies. The signs and symptoms of potential                            delayed complications were discussed with the                            patient. Return to normal activities tomorrow.                            Written discharge instructions were provided to the                            patient.                           - Resume previous diet.                           -  Continue present medications.                           - Resume Eliquis (apixaban) at prior dose in 3                            days. Refer to managing physician for further                            adjustment of therapy.                           - Await pathology results. Procedure Code(s):        --- Professional ---                           [id number], Esophagogastroduodenoscopy, flexible,                            transoral; with biopsy, single or multiple Diagnosis Code(s):        --- Professional ---                           K44.9, Diaphragmatic hernia without obstruction or                            gangrene                           K29.70, Gastritis, unspecified, without bleeding  R13.10, Dysphagia, unspecified                           K92.1, Melena (includes Hematochezia) CPT copyright 2022 [employer]. All rights reserved. The codes documented in this report are preliminary and upon coder review may  be revised to meet current compliance requirements. Meryl Dare, MD 03/08/2023 8:52:46 AM This report has been signed electronically. Number of Addenda: 0

## 2023-03-08 NOTE — Anesthesia Postprocedure Evaluation (Signed)
Anesthesia Post Note  Patient: [name]  Procedure(s) Performed: COLONOSCOPY WITH PROPOFOL ESOPHAGOGASTRODUODENOSCOPY (EGD) WITH PROPOFOL POLYPECTOMY BIOPSY     Patient location during evaluation: Endoscopy Anesthesia Type: MAC Level of consciousness: awake Pain management: pain level controlled Vital Signs Assessment: post-procedure vital signs reviewed and stable Respiratory status: spontaneous breathing, nonlabored ventilation and respiratory function stable Cardiovascular status: blood pressure returned to baseline and stable Postop Assessment: no apparent nausea or vomiting Anesthetic complications: no   No notable events documented.  Last Vitals:  Vitals:   03/08/23 0900 03/08/23 0910  BP: (!) 172/93 (!) 160/90  Pulse: 66 68  Resp: 19 18  Temp:    SpO2: 100% 94%    Last Pain:  Vitals:   03/08/23 0910  TempSrc:   PainSc: 0-No pain                 Rhylin Venters P Sipriano Fendley

## 2023-03-08 NOTE — Discharge Instructions (Signed)

## 2023-03-08 NOTE — Anesthesia Procedure Notes (Signed)
Procedure Name: MAC Date/Time: 03/08/2023 7:36 AM  Performed by: Maurene Capes, CRNAPre-anesthesia Checklist: Patient identified, Emergency Drugs available, Suction available and Patient being monitored Patient Re-evaluated:Patient Re-evaluated prior to induction Oxygen Delivery Method: Simple face mask Preoxygenation: Pre-oxygenation with 100% oxygen Induction Type: IV induction Placement Confirmation: positive ETCO2 Dental Injury: Teeth and Oropharynx as per pre-operative assessment

## 2023-03-08 NOTE — Op Note (Signed)
[facility] Patient Name: Lindsay Gomez Procedure Date: 03/08/2023 MRN: [id number] Attending MD: Meryl Dare , MD, [id number] Date of Birth: 22-Aug-1970 CSN: [id number] Age: 52 Admit Type: Outpatient Procedure:                Colonoscopy Indications:              Lower abdominal pain, Clinically significant                            diarrhea of unexplained origin, Change in bowel                            habits Providers:                Venita Lick. Russella Dar, MD, Alan Ripper, Technician,                            Fransisca Connors, Rogue Jury, RN Referring MD:             Verlon Au, MD Medicines:                Monitored Anesthesia Care Complications:            No immediate complications. Estimated blood loss:                            None. Estimated Blood Loss:     Estimated blood loss: none. Procedure:                Pre-Anesthesia Assessment:                           - Prior to the procedure, a History and Physical                            was performed, and patient medications and                            allergies were reviewed. The patient's tolerance of                            previous anesthesia was also reviewed. The risks                            and benefits of the procedure and the sedation                            options and risks were discussed with the patient.                            All questions were answered, and informed consent                            was obtained. Prior Anticoagulants: The patient has                            taken Eliquis (  apixaban), last dose was 3 days                            prior to procedure. ASA Grade Assessment: III - A                            patient with severe systemic disease. After                            reviewing the risks and benefits, the patient was                            deemed in satisfactory condition to undergo the                            procedure.                            After obtaining informed consent, the colonoscope                            was passed under direct vision. Throughout the                            procedure, the patient's blood pressure, pulse, and                            oxygen saturations were monitored continuously. The                            [id number]-HQ190L ([id number]) Olympus colonoscope was                            introduced through the anus and advanced to the the                            cecum, identified by appendiceal orifice and                            ileocecal valve. The terminal ileum was                            photographed. The quality of the bowel preparation                            was good. The colonoscopy was performed without                            difficulty. The patient tolerated the procedure                            well. Scope In: 7:42:35 AM Scope Out: 8:20:10 AM Scope Withdrawal Time: 0 hours 32 minutes 25 seconds  Total Procedure Duration: 0 hours 37 minutes 35 seconds  Findings:  The perianal and digital rectal examinations were normal.      The terminal ileum appeared normal.      A 25 mm polyp was found in the sigmoid colon. The polyp was pedunculated       with two head on one stalk. The polyp was removed with a piecemeal       technique using a hot snare. Resection and retrieval were complete. Area       was tattooed with an injection of 2 mL of Spot (carbon black) about 10       cm proximal to the site.      A few small-mouthed diverticula were found in the sigmoid colon.      The exam was otherwise without abnormality on direct and retroflexion       views. Random biopsies obtained. Impression:               - The examined portion of the ileum was normal.                           - One 25 mm polyp in the sigmoid colon, removed                            piecemeal using a hot snare. Resected and                            retrieved. Tattooed.                            - Mild diverticulosis in the sigmoid colon.                           - The examination was otherwise normal on direct                            and retroflexion views. Moderate Sedation:      Not Applicable - Patient had care per Anesthesia. Recommendation:           - Repeat colonoscopy date to be determined after                            pending pathology results are reviewed for                            surveillance based on pathology results.                           - Resume Eliquis (apixaban) in 3 days at prior                            dose. Refer to managing physician for further                            adjustment of therapy.                           - Patient has a contact number available for  emergencies. The signs and symptoms of potential                            delayed complications were discussed with the                            patient. Return to normal activities tomorrow.                            Written discharge instructions were provided to the                            patient.                           - Resume previous diet adding high fiber.                           - Continue present medications.                           - Await pathology results.                           - No aspirin, ibuprofen, naproxen, or other                            non-steroidal anti-inflammatory drugs for 2 weeks                            after polyp removal. Procedure Code(s):        --- Professional ---                           [phone], Colonoscopy, flexible; with removal of                            tumor(s), polyp(s), or other lesion(s) by snare                            technique                           [id number], Colonoscopy, flexible; with directed                            submucosal injection(s), any substance Diagnosis Code(s):        --- Professional ---                           D12.5, Benign neoplasm of  sigmoid colon                           R10.30, Lower abdominal pain, unspecified                           R19.7, Diarrhea, unspecified  R19.4, Change in bowel habit                           K57.30, Diverticulosis of large intestine without                            perforation or abscess without bleeding CPT copyright 2022 American Medical Association. All rights reserved. The codes documented in this report are preliminary and upon coder review may  be revised to meet current compliance requirements. Meryl Dare, MD 03/08/2023 8:49:09 AM This report has been signed electronically. Number of Addenda: 0

## 2023-03-08 NOTE — H&P (Addendum)
History & Physical  Primary Care Physician:  Verlon Au, MD Primary Gastroenterologist: Claudette Head, MD  Impression / Plan:  Intermittent diarrhea, intermittent lower abdominal cramping, intermittent dark stools and vague dysphagia for colonoscopy and EGD.  She is maintained on Eliquis which has been held.  CHIEF COMPLAINT: Diarrhea, lower abdominal pain, dark stools, questionable dysphagia  HPI: Lindsay Gomez is a 52 y.o. female with intermittent diarrhea, intermittent lower abdominal cramping, intermittent dark stools (? melena) and questionable dysphagia for colonoscopy and EGD.  She is maintained on Eliquis which has been held.   Past Medical History:  Diagnosis Date   Allergy    rhinitis   Anemia    Anxiety    Atrial fibrillation (HCC)    CHF (congestive heart failure) (HCC)    Depression    Diabetes mellitus without complication (HCC)    glucose usually 140-150   Enlarged heart    Hyperlipidemia    Hypertension    Morbid obesity (HCC)    Shortness of breath dyspnea    with low iron   Sleep apnea    Stroke [facility])     Past Surgical History:  Procedure Laterality Date   ABDOMINAL HYSTERECTOMY N/A 01/29/2015   Procedure: TOTAL ABDOMINAL HYSTERECTOMY ;  Surgeon: Kirkland Hun, MD;  Location: [facility];  Service: Gynecology;  Laterality: N/A;   BILATERAL SALPINGECTOMY Bilateral 01/29/2015   Procedure: BILATERAL SALPINGECTOMY;  Surgeon: Kirkland Hun, MD;  Location: [facility];  Service: Gynecology;  Laterality: Bilateral;   CESAREAN SECTION     KNEE ARTHROSCOPY Right 03/16/1990    Prior to Admission medications   Medication Sig Start Date End Date Taking? Authorizing Provider  Azilsartan-Chlorthalidone 40-25 MG TABS Take 1 tablet by mouth daily. 12/02/22  Yes [provider]  bismuth subsalicylate (PEPTO BISMOL) 262 MG chewable tablet Chew 524 mg by mouth as needed for indigestion or diarrhea or loose stools.   Yes [provider]   diltiazem (CARDIZEM CD) 120 MG 24 hr capsule Take 1 capsule by mouth daily. 09/28/22 09/28/23 Yes [provider]  insulin aspart (NOVOLOG FLEXPEN) 100 UNIT/ML FlexPen Max daily 45 units 12/30/22  Yes Shamleffer, Konrad Dolores, MD  Insulin NPH, Human,, Isophane, (NOVOLIN N FLEXPEN) 100 UNIT/ML Kiwkpen Inject 16 Units into the skin in the morning and at bedtime. 02/04/23  Yes Shamleffer, Konrad Dolores, MD  Insulin Pen Needle 32G X 4 MM MISC 1 Device by Does not apply route in the morning, at noon, in the evening, and at bedtime. 12/30/22  Yes Shamleffer, Konrad Dolores, MD  ondansetron (ZOFRAN) 4 MG tablet Take Zofran 4 mg by mouth 30 minutes prior to each prep dose. 12/11/22  Yes Meryl Dare, MD  potassium chloride SA (KLOR-CON M) 20 MEQ tablet Take 1 tablet (20 mEq total) by mouth daily. 02/05/23  Yes Shamleffer, Konrad Dolores, MD  Sodium Sulfate-Mag Sulfate-KCl (SUTAB) [phone] MG TABS Use as directed for colonoscopy. 12/11/22  Yes Meryl Dare, MD  triamcinolone cream (KENALOG) 0.5 % Apply topically 3 (three) times daily. 09/28/22  Yes [provider]  Continuous Glucose Receiver (FREESTYLE LIBRE 3 READER) DEVI 1 Device by Does not apply route continuous. USE TO CHECK BLOOD SUGAR Patient not taking: Reported on 12/30/2022 08/24/22   Shamleffer, Konrad Dolores, MD  Continuous Glucose Sensor (FREESTYLE LIBRE 2 SENSOR) MISC Change every 14 days 12/30/22   Shamleffer, Konrad Dolores, MD  Continuous Glucose Sensor (FREESTYLE LIBRE 3 SENSOR) MISC 1 Device by Does not apply route  every 14 (fourteen) days. Place 1 sensor on the skin every 14 days. Use to check glucose continuously Patient not taking: Reported on 12/30/2022 07/01/22   Shamleffer, Konrad Dolores, MD  ELIQUIS 5 MG TABS tablet Take 1 tablet (5 mg total) by mouth 2 (two) times daily. 06/26/22   Tolia, Sunit, DO  rosuvastatin (CRESTOR) 20 MG tablet Take 1 tablet (20 mg total) by mouth at bedtime. 03/19/22 11/10/22   Tolia, Sunit, DO    No current facility-administered medications for this encounter.    Allergies as of 12/11/2022 - Review Complete 11/10/2022  Allergen Reaction Noted   Lisinopril Other (See Comments) 11/28/2008   Metformin hcl Other (See Comments) 09/18/2020   Ozempic (0.25 or 0.5 mg-dose) [semaglutide(0.25 or 0.5mg -dos)] Other (See Comments) 11/10/2022   Trulicity [dulaglutide] Other (See Comments) 09/18/2020    Family History  Problem Relation Age of Onset   Hypertension Mother    Diabetes Mother    Coronary artery disease Father 74   Heart failure Father    Heart attack Father    Diabetes Father    Multiple myeloma Father    Stroke Brother 31   Pancreatic cancer Brother    Ovarian cancer Paternal Grandmother    Dementia Paternal Grandmother    Heart disease Paternal Grandfather    Kidney disease Paternal Grandfather    Hypertension Other    Hyperlipidemia Other    Lupus Daughter    Diabetes Daughter    Anxiety disorder Daughter    Depression Daughter    Obesity Daughter    Obesity Daughter    Esophageal cancer Neg Hx    Colon cancer Neg Hx    Stomach cancer Neg Hx    Atrial fibrillation Neg Hx     Social History   Socioeconomic History   Marital status: Single    Spouse name: Not on file   Number of children: 2   Years of education: Not on file   Highest education level: Not on file  Occupational History   Occupation: Kindergarten Runner, broadcasting/film/video    Comment: Programmer, multimedia  Tobacco Use   Smoking status: Never   Smokeless tobacco: Never  Vaping Use   Vaping status: Never Used  Substance and Sexual Activity   Alcohol use: No   Drug use: No   Sexual activity: Not Currently    Birth control/protection: None  Other Topics Concern   Not on file  Social History Narrative   Pt lives alone in 2 story home   Has 2 children   Masters degree   Works as an Programmer, systems    Social Drivers of Corporate investment banker Strain: Not on file  Food Insecurity: Low  Risk  (02/02/2023)   Received from [facility]   Hunger Vital Sign    Worried About Running Out of Food in the Last Year: Never true    Ran Out of Food in the Last Year: Never true  Transportation Needs: No Transportation Needs (02/02/2023)   Received from [facility]    In the past 12 months, has lack of reliable transportation kept you from medical appointments, meetings, work or from getting things needed for daily living? : No  Physical Activity: Not on file  Stress: Not on file  Social Connections: Not on file  Intimate Partner Violence: Not on file    Review of Systems:  All systems reviewed were negative except where noted in HPI.   Physical Exam: Vital signs in last 24  hours: Temp:  [97.2 F (36.2 C)] 97.2 F (36.2 C) (12/23 0649) Pulse Rate:  [68] 68 (12/23 0649) Resp:  [16] 16 (12/23 0649) BP: (169)/(83) 169/83 (12/23 0649) SpO2:  [100 %] 100 % (12/23 0649) Weight:  [409.8 kg] 118.8 kg (12/23 0649)   General:  Alert, well-developed, in NAD Head:  Normocephalic and atraumatic. Eyes:  Sclera clear, no icterus.   Conjunctiva pink. Ears:  Normal auditory acuity. Mouth:  No deformity or lesions.  Neck:  Supple; no masses. Lungs:  Clear throughout to auscultation.   No wheezes, crackles, or rhonchi.  Heart:  Regular rate and rhythm; no murmurs. Abdomen:  Soft, nondistended, nontender. No masses, hepatomegaly. No palpable masses.  Normal bowel sounds.    Rectal:  Deferred   Msk:  Symmetrical without gross deformities. Extremities:  Without edema. Neurologic:  Alert and  oriented x 4; grossly normal neurologically. Skin:  Intact without significant lesions or rashes. Psych:  Alert and cooperative. Normal mood and affect.   Venita Lick. Russella Dar  03/08/2023, 7:31 AM See Loretha Stapler, [facility], to contact our on call provider

## 2023-03-08 NOTE — Anesthesia Preprocedure Evaluation (Addendum)
Anesthesia Evaluation  Patient identified by MRN, date of birth, ID band Patient awake    Reviewed: Allergy & Precautions, NPO status , Patient's Chart, lab work & pertinent test results  Airway Mallampati: II  TM Distance: >3 FB Neck ROM: Full    Dental  (+) Chipped, Missing   Pulmonary sleep apnea    Pulmonary exam normal        Cardiovascular hypertension, Pt. on medications +CHF  Normal cardiovascular exam+ dysrhythmias Atrial Fibrillation      Neuro/Psych  PSYCHIATRIC DISORDERS Anxiety Depression    CVA    GI/Hepatic negative GI ROS, Neg liver ROS, Bowel prep,,,  Endo/Other  diabetes, Insulin Dependent  Class 3 obesity  Renal/GU negative Renal ROS     Musculoskeletal negative musculoskeletal ROS (+)    Abdominal   Peds  Hematology  (+) Blood dyscrasia (Eliquis)   Anesthesia Other Findings Change in bowel habits, diarrhea, fecal incontience, melena, dysphagia  Reproductive/Obstetrics                             Anesthesia Physical Anesthesia Plan  ASA: 3  Anesthesia Plan: MAC   Post-op Pain Management:    Induction:   PONV Risk Score and Plan: 2 and Propofol infusion and Treatment may vary due to age or medical condition  Airway Management Planned: Nasal Cannula  Additional Equipment:   Intra-op Plan:   Post-operative Plan:   Informed Consent: I have reviewed the patients History and Physical, chart, labs and discussed the procedure including the risks, benefits and alternatives for the proposed anesthesia with the patient or authorized representative who has indicated his/her understanding and acceptance.       Plan Discussed with: CRNA  Anesthesia Plan Comments:         Anesthesia Quick Evaluation

## 2023-03-08 NOTE — Transfer of Care (Signed)
Immediate Anesthesia Transfer of Care Note  Patient: Lindsay Gomez  Procedure(s) Performed: COLONOSCOPY WITH PROPOFOL ESOPHAGOGASTRODUODENOSCOPY (EGD) WITH PROPOFOL POLYPECTOMY BIOPSY  Patient Location: PACU and Endoscopy Unit  Anesthesia Type:MAC  Level of Consciousness: drowsy and patient cooperative  Airway & Oxygen Therapy: Patient Spontanous Breathing and Patient connected to face mask oxygen  Post-op Assessment: Report given to RN and Post -op Vital signs reviewed and stable  Post vital signs: Reviewed and stable  Last Vitals:  Vitals Value Taken Time  BP 156/63 03/08/23 0835  Temp    Pulse 76 03/08/23 0838  Resp 19 03/08/23 0838  SpO2 100 % 03/08/23 0838  Vitals shown include unfiled device data.  Last Pain:  Vitals:   03/08/23 0649  TempSrc: Tympanic  PainSc: 0-No pain         Complications: No notable events documented.

## 2023-03-10 ENCOUNTER — Encounter (HOSPITAL_COMMUNITY): Payer: Self-pay | Admitting: Gastroenterology

## 2023-03-11 LAB — SURGICAL PATHOLOGY

## 2023-03-13 ENCOUNTER — Encounter: Payer: Self-pay | Admitting: Gastroenterology

## 2023-03-23 ENCOUNTER — Telehealth: Payer: Self-pay | Admitting: Neurology

## 2023-03-23 MED ORDER — SEMAGLUTIDE(0.25 OR 0.5MG/DOS) 2 MG/3ML ~~LOC~~ SOPN: 0.5000 mg | PEN_INJECTOR | SUBCUTANEOUS | 3 refills | Status: DC

## 2023-03-23 NOTE — Telephone Encounter (Signed)
 Pt cancelled appointment. Pt said do not need the appointment.

## 2023-03-24 ENCOUNTER — Telehealth: Payer: BC Managed Care – PPO | Admitting: Neurology

## 2023-04-28 ENCOUNTER — Other Ambulatory Visit (HOSPITAL_COMMUNITY): Payer: Self-pay

## 2023-04-28 ENCOUNTER — Telehealth: Payer: Self-pay

## 2023-04-28 NOTE — Telephone Encounter (Signed)
Pharmacy Patient Advocate Encounter   Received notification from CoverMyMeds that prior authorization for Ozempic (0.25 or 0.5 MG/DOSE) 2MG /3ML pen-injectors is required/requested.   Insurance verification completed.   The patient is insured through [employer] .   Per test claim: PA required; PA submitted to above mentioned insurance via CoverMyMeds Key/confirmation #/EOC [facility] Status is pending

## 2023-05-04 ENCOUNTER — Other Ambulatory Visit (HOSPITAL_COMMUNITY): Payer: Self-pay

## 2023-05-04 ENCOUNTER — Ambulatory Visit (INDEPENDENT_AMBULATORY_CARE_PROVIDER_SITE_OTHER): Payer: 59

## 2023-05-04 ENCOUNTER — Other Ambulatory Visit: Payer: Self-pay

## 2023-05-04 DIAGNOSIS — E1165 Type 2 diabetes mellitus with hyperglycemia: Secondary | ICD-10-CM | POA: Diagnosis not present

## 2023-05-04 LAB — POCT GLYCOSYLATED HEMOGLOBIN (HGB A1C): Hemoglobin A1C: 12.9 % — AB (ref 4.0–5.6)

## 2023-05-05 ENCOUNTER — Telehealth: Payer: Self-pay | Admitting: Internal Medicine

## 2023-05-05 ENCOUNTER — Telehealth (INDEPENDENT_AMBULATORY_CARE_PROVIDER_SITE_OTHER): Payer: BC Managed Care – PPO | Admitting: Internal Medicine

## 2023-05-05 ENCOUNTER — Encounter: Payer: Self-pay | Admitting: Internal Medicine

## 2023-05-05 DIAGNOSIS — E1142 Type 2 diabetes mellitus with diabetic polyneuropathy: Secondary | ICD-10-CM

## 2023-05-05 DIAGNOSIS — Z794 Long term (current) use of insulin: Secondary | ICD-10-CM

## 2023-05-05 DIAGNOSIS — E1165 Type 2 diabetes mellitus with hyperglycemia: Secondary | ICD-10-CM | POA: Diagnosis not present

## 2023-05-05 DIAGNOSIS — E1159 Type 2 diabetes mellitus with other circulatory complications: Secondary | ICD-10-CM | POA: Diagnosis not present

## 2023-05-05 MED ORDER — NOVOLIN N FLEXPEN 100 UNIT/ML ~~LOC~~ SUPN: 22.0000 [IU] | PEN_INJECTOR | Freq: Two times a day (BID) | SUBCUTANEOUS | 4 refills | Status: DC

## 2023-05-05 MED ORDER — NOVOLOG FLEXPEN 100 UNIT/ML ~~LOC~~ SOPN: PEN_INJECTOR | SUBCUTANEOUS | 3 refills | Status: DC

## 2023-05-05 MED ORDER — SEMAGLUTIDE(0.25 OR 0.5MG/DOS) 2 MG/3ML ~~LOC~~ SOPN: 0.5000 mg | PEN_INJECTOR | SUBCUTANEOUS | 3 refills | Status: DC

## 2023-05-05 MED ORDER — INSULIN PEN NEEDLE 32G X 4 MM MISC: 1.0000 | Freq: Four times a day (QID) | 3 refills | Status: DC

## 2023-05-05 NOTE — Telephone Encounter (Signed)
Pharmacy Patient Advocate Encounter  Received notification from [employer] that Prior Authorization for Ozempic has been APPROVED through 04/27/2026

## 2023-05-05 NOTE — Patient Instructions (Addendum)
Take Ozempic 0.25 mg weekly for 6 weeks, than increase to 0.5 mg weekly Increase Novolin-N 22 units every morning and at bedtime  Increase Novolog 16 units with each meal Take Novolog 8 units with each snack  Novolog correctional insulin: ADD extra units on insulin to your meal-time Novolog dose if your blood sugars are higher than 160. Use the scale below to help guide you:   Blood sugar before meal Number of units to inject  Less than 155 0 unit  156 - 180 1 units  181 - 205 2 units  206 - 230 3 units  231 - 255 4 units  256 - 280 5 units  281 - 305 6 units  306 - 330 7 units  331- 355 8 units  356 - 380 9 units   381 - 405 10 units     HOW TO TREAT LOW BLOOD SUGARS (Blood sugar LESS THAN 70 MG/DL) Please follow the RULE OF 15 for the treatment of hypoglycemia treatment (when your (blood sugars are less than 70 mg/dL)   STEP 1: Take 15 grams of carbohydrates when your blood sugar is low, which includes:  3-4 GLUCOSE TABS  OR 3-4 OZ OF JUICE OR REGULAR SODA OR ONE TUBE OF GLUCOSE GEL    STEP 2: RECHECK blood sugar in 15 MINUTES STEP 3: If your blood sugar is still low at the 15 minute recheck --> then, go back to STEP 1 and treat AGAIN with another 15 grams of carbohydrates.

## 2023-05-05 NOTE — Progress Notes (Signed)
Virtual Visit via Video Note  I connected with Lindsay Gomez on 05/05/23  at 2:40 PM  by a video enabled telemedicine application and verified that I am speaking with the correct person using two identifiers.   I discussed the limitations of evaluation and management by telemedicine and the availability of in person appointments. The patient expressed understanding and agreed to proceed.   -Location of the patient : Home -Location of the provider : Home -The names of all persons participating in the telemedicine service : Pt and myself             Name: Lindsay Gomez  Age/ Sex: 53 y.o., female   MRN/ DOB: [id number], Aug 28, 1970     PCP: Verlon Au, MD   Reason for Endocrinology Evaluation: Type 2 Diabetes Mellitus  Initial Endocrine Consultative Visit: 01/07/18    PATIENT IDENTIFIER: Lindsay Gomez is a 53 y.o. female with a past medical history of HTN, Obesity,CVA, OSA not on treatment and T2DM . The patient has followed with Endocrinology clinic since 01/07/18 for consultative assistance with management of her diabetes.  DIABETIC HISTORY:  Ms. Gartley was diagnosed with T2DM in 2016,She is intolerant to Metformin.On her initial visit to our clinic she was on Basaglar, glipizide and Trulicity, she did admit to noncompliance with her meds, she would take basaglar once a month. Her hemoglobin A1c has ranged from  6.3% in 2014, peaking at 14.1% in 2018  Glimepiride started 06/2022 with an A1c of 9.2% than she self discontinued    Patient was started on basal insulin by her PCP 11/2022 with an A1c of 11.8% I started her on NovoLog 12/2022 with an A1c of 13.0%  She endorsed GI side effects with Ozempic but wanted to restart 2025  She endorsed GI side effects with Evaristo Bury and Lantus   Due to hypokalemia she had normal renin, aldosterone, and ACTH 01/2023   SUBJECTIVE:   During the last visit (12/30/2022): A1c 13.0 %    Today (05/05/2023): Ms. Lapage is  here for a follow up on diabetes management.  She checks her glucose multiple times a day through freestyle libre, no hypoglycemia   She had a colonoscopy 02/2023, a polyp removed  Denies nausea or vomiting  Denies abdominal pain Has noted constipation but no diarrhea   HOME DIABETES REGIMEN:  Novolin-N 16 units twice daily NovoLog 12 units 3 times daily before every meal Correction factor: NovoLog (BG -130/25) TIDQAC Ozempic 0.5 mg weekly-awaiting on prior authorization   CONTINUOUS GLUCOSE MONITORING RECORD INTERPRETATION    Dates of Recording: 1/27-04/25/2023  Sensor description:freestyle libre  Results statistics:   CGM use % of time 35  Average and SD 370/15.8  Time in range     1   %  % Time Above 180 8  % Time above 250 91  % Time Below target 0   Glycemic patterns summary: Hyperglycemia noted throughout the day and night  Hyperglycemic episodes all day and night  Hypoglycemic episodes occurred N/A  Overnight periods: High    DIABETIC COMPLICATIONS: Microvascular complications:  Neuropathy Denies:  Retinopathy, nephropathy Last eye exam: Completed 05/2022     Macrovascular complications:  CVA (02/2017) Denies: CAD, PVD       HISTORY:  Past Medical History:  Past Medical History:  Diagnosis Date   Allergy    rhinitis   Anemia    Anxiety    Atrial fibrillation (HCC)    CHF (congestive heart failure) (HCC)  Depression    Diabetes mellitus without complication (HCC)    glucose usually 140-150   Enlarged heart    Hyperlipidemia    Hypertension    Morbid obesity (HCC)    Shortness of breath dyspnea    with low iron   Sleep apnea    Stroke [facility])    Past Surgical History:  Past Surgical History:  Procedure Laterality Date   ABDOMINAL HYSTERECTOMY N/A 01/29/2015   Procedure: TOTAL ABDOMINAL HYSTERECTOMY ;  Surgeon: Kirkland Hun, MD;  Location: [facility];  Service: Gynecology;  Laterality: N/A;   BILATERAL SALPINGECTOMY Bilateral  01/29/2015   Procedure: BILATERAL SALPINGECTOMY;  Surgeon: Kirkland Hun, MD;  Location: [facility];  Service: Gynecology;  Laterality: Bilateral;   BIOPSY  03/08/2023   Procedure: BIOPSY;  Surgeon: Meryl Dare, MD;  Location: [facility];  Service: Gastroenterology;;   CESAREAN SECTION     COLONOSCOPY WITH PROPOFOL N/A 03/08/2023   Procedure: COLONOSCOPY WITH PROPOFOL;  Surgeon: Meryl Dare, MD;  Location: [facility];  Service: Gastroenterology;  Laterality: N/A;   ESOPHAGOGASTRODUODENOSCOPY (EGD) WITH PROPOFOL N/A 03/08/2023   Procedure: ESOPHAGOGASTRODUODENOSCOPY (EGD) WITH PROPOFOL;  Surgeon: Meryl Dare, MD;  Location: [facility];  Service: Gastroenterology;  Laterality: N/A;   KNEE ARTHROSCOPY Right 03/16/1990   POLYPECTOMY  03/08/2023   Procedure: POLYPECTOMY;  Surgeon: Meryl Dare, MD;  Location: [facility];  Service: Gastroenterology;;   SUBMUCOSAL TATTOO INJECTION  03/08/2023   Procedure: SUBMUCOSAL TATTOO INJECTION;  Surgeon: Meryl Dare, MD;  Location: [facility];  Service: Gastroenterology;;   Social History:  reports that she has never smoked. She has never used smokeless tobacco. She reports that she does not drink alcohol and does not use drugs. Family History:  Family History  Problem Relation Age of Onset   Hypertension Mother    Diabetes Mother    Coronary artery disease Father 53   Heart failure Father    Heart attack Father    Diabetes Father    Multiple myeloma Father    Stroke Brother 65   Pancreatic cancer Brother    Ovarian cancer Paternal Grandmother    Dementia Paternal Grandmother    Heart disease Paternal Grandfather    Kidney disease Paternal Grandfather    Hypertension Other    Hyperlipidemia Other    Lupus Daughter    Diabetes Daughter    Anxiety disorder Daughter    Depression Daughter    Obesity Daughter    Obesity Daughter    Esophageal cancer Neg Hx    Colon cancer Neg Hx    Stomach cancer Neg Hx    Atrial  fibrillation Neg Hx      HOME MEDICATIONS: Allergies as of 05/05/2023       Reactions   Lisinopril Other (See Comments)   Cough    Metformin Hcl Other (See Comments)   Ozempic (0.25 Or 0.5 Mg-dose) [semaglutide(0.25 Or 0.5mg -dos)] Other (See Comments)   Stomach pain   Trulicity [dulaglutide] Other (See Comments)        Medication List        Accurate as of May 05, 2023  9:32 AM. If you have any questions, ask your nurse or doctor.          Azilsartan-Chlorthalidone 40-25 MG Tabs Take 1 tablet by mouth daily.   bismuth subsalicylate 262 MG chewable tablet Commonly known as: PEPTO BISMOL Chew 524 mg by mouth as needed for indigestion or diarrhea or loose stools.   diltiazem 120 MG 24  hr capsule Commonly known as: CARDIZEM CD Take 1 capsule by mouth daily.   Eliquis 5 MG Tabs tablet Generic drug: apixaban Take 1 tablet (5 mg total) by mouth 2 (two) times daily.   FreeStyle Libre 3 Reader Devi 1 Device by Does not apply route continuous. USE TO CHECK BLOOD SUGAR   FreeStyle Libre 3 Sensor Misc 1 Device by Does not apply route every 14 (fourteen) days. Place 1 sensor on the skin every 14 days. Use to check glucose continuously   FreeStyle Libre 2 Sensor Misc Change every 14 days   Insulin Pen Needle 32G X 4 MM Misc 1 Device by Does not apply route in the morning, at noon, in the evening, and at bedtime.   NovoLIN N FlexPen 100 UNIT/ML FlexPen Generic drug: Insulin NPH (Human) (Isophane) Inject 16 Units into the skin in the morning and at bedtime.   NovoLOG FlexPen 100 UNIT/ML FlexPen Generic drug: insulin aspart Max daily 45 units   ondansetron 4 MG tablet Commonly known as: Zofran Take Zofran 4 mg by mouth 30 minutes prior to each prep dose.   potassium chloride SA 20 MEQ tablet Commonly known as: KLOR-CON M Take 1 tablet (20 mEq total) by mouth daily.   rosuvastatin 20 MG tablet Commonly known as: CRESTOR Take 1 tablet (20 mg total) by mouth  at bedtime.   Semaglutide(0.25 or 0.5MG /DOS) 2 MG/3ML Sopn Inject 0.5 mg into the skin once a week.   Sutab [id number] MG Tabs Generic drug: Sodium Sulfate-Mag Sulfate-KCl Use as directed for colonoscopy.   triamcinolone cream 0.5 % Commonly known as: KENALOG Apply topically 3 (three) times daily.         OBJECTIVE:   Vital Signs: LMP 12/18/2014 (Exact Date)   Wt Readings from Last 3 Encounters:  03/08/23 262 lb (118.8 kg)  02/04/23 271 lb (122.9 kg)  12/30/22 268 lb (121.6 kg)     Exam: General: Pt appears well and is in NAD  Neuro: MS is good with appropriate affect, pt is alert and Ox3   DM foot exam 07/01/2022 The skin of the feet is intact without sores or ulcerations. The pedal pulses are 2+ on right and 2+ on left. The sensation is intact to a screening 5.07, 10 gram monofilament bilaterally      DATA REVIEWED:  Latest Reference Range & Units 10/19/22 16:17  Sodium 135 - 145 mEq/L 136  Potassium 3.5 - 5.1 mEq/L 3.6  Chloride 96 - 112 mEq/L 95 (L)  CO2 19 - 32 mEq/L 31  Glucose 70 - 99 mg/dL 098 (H)  BUN 6 - 23 mg/dL 11  Creatinine 1.19 - 1.47 mg/dL 8.29  Calcium 8.4 - 56.2 mg/dL 9.3  Alkaline Phosphatase 39 - 117 U/L 98  Albumin 3.5 - 5.2 g/dL 4.2  AST 0 - 37 U/L 28  ALT 0 - 35 U/L 25  Total Protein 6.0 - 8.3 g/dL 8.0  Total Bilirubin 0.2 - 1.2 mg/dL 0.4  GFR >13.08 mL/min 84.81    Old records , labs and images have been reviewed.    ASSESSMENT / PLAN / RECOMMENDATIONS:   1) Type 2 Diabetes Mellitus, Poorly Controlled , With neuropathic  and macrovascular complications - Most recent A1c of 12.9 %. Goal A1c < 7.0 %.    -Patient continues with poorly controlled DM, this is due to imperfect adherence to NovoLog intake as well as dietary indiscretions -In the past she has endorsed GI side effects to Ozempic, but she would like  to try it again -Intolerant to [facility] -She self discontinued glimepiride -She had attributed abdominal cramps and  diarrhea to [place] and Lantus, so I switched her to NPH  -I will increase her Novolin -N , and NovoLog as below -She will be provided with the NovoLog dose to take with a snack    MEDICATIONS: Increase Novolin-N 22 units twice daily Increase NovoLog 16 units 3 times daily before every meal Take NovoLog 8 units with snack Correction factor: NovoLog (BG -130/25) TIDQAC Start Ozempic 0.25 mg weekly for 6 weeks, than increase to 0.5 mg weekly  EDUCATION / INSTRUCTIONS: BG monitoring instructions: Patient is instructed to check her blood sugars 3 times a day, before meals  2) Diabetic complications:  Eye:She does not have known diabetic retinopathy. Pt urged to schedule an eye exam again Neuro/ Feet: Does have known diabetic peripheral neuropathy. Renal: Patient does not have known baseline CKD. She is on on an ACEI/ARB at present.     Follow-up in 3 months    Signed electronically by: Lyndle Herrlich, MD  [facility] Endocr[facility]ology  [facility] [address] [place], [place] [address] Phone: [phone] FAX: [phone]   CC: Verlon Au, MD [address] [place] [address] Phone: [phone]  Fax: [phone]  Return to Endocrinology clinic as below: Future Appointments  Date Time Provider Department Center  05/05/2023  2:40 PM Naketa Daddario, Konrad Dolores, MD [facility] None

## 2023-05-05 NOTE — Telephone Encounter (Signed)
Update on PA

## 2023-05-05 NOTE — Telephone Encounter (Signed)
Can you please contact the patient and schedule her to see me in 3 months    Thanks

## 2023-05-10 NOTE — Telephone Encounter (Signed)
 Patient scheduled for next available 3pm per patient request

## 2023-06-20 ENCOUNTER — Ambulatory Visit (HOSPITAL_COMMUNITY): Admission: EM | Admit: 2023-06-20 | Discharge: 2023-06-20 | Disposition: A | Discharge status: Home / Self Care

## 2023-06-20 DIAGNOSIS — F331 Major depressive disorder, recurrent, moderate: Secondary | ICD-10-CM

## 2023-06-20 NOTE — Progress Notes (Signed)
   06/20/23 0804  [facility] Triage Screening (Walk-ins at [facility] only)  How Did You Hear About [place]? Self  What Is the Reason for Your Visit/Call Today? Alexzia Kasler presents to [facility] voluntarily unaccompanied. Pt states that things have been crazy and she deals with depression & anxiety. Pt states that they have gotten the best of her, because of grief and stressful work environment. Pt states that she unable to sleep because of all the stress from work. Pt currently denies SI, HI, AVH and alcohol/drug use. Pt states that she may needs some medication to assist with her problems.  How Long Has This Been Causing You Problems? > than 6 months  Have You Recently Had Any Thoughts About Hurting Yourself? No  Are You Planning to Commit Suicide/Harm Yourself At This time? No  Have you Recently Had Thoughts About Hurting Someone Karolee Ohs? No  Are You Planning To Harm Someone At This Time? No  Physical Abuse Denies  Verbal Abuse Denies  Sexual Abuse Denies  Exploitation of patient/patient's resources Denies  Self-Neglect Denies  Are you currently experiencing any auditory, visual or other hallucinations? No  Have You Used Any Alcohol or Drugs in the Past 24 Hours? No  Do you have any current medical co-morbidities that require immediate attention? Yes  Please describe current medical co-morbidities that require immediate attention: hypertension, diabetes  Clinician description of patient physical appearance/behavior: groomed, tearful, cooperative  What Do You Feel Would Help You the Most Today? Medication(s);Treatment for Depression or other mood problem  If access to [facility] Urgent Care was not available, would you have sought care in the Emergency Department? No  Determination of Need Routine (7 days)  Options For Referral Medication Management;Outpatient Therapy

## 2023-06-20 NOTE — Discharge Instructions (Addendum)
 It is recommended that you take a short term leave off work based on the symptoms that you presented with today. If you are experiencing worsening of your mental health, please call 911, [phone], go to the nearest ER, or come back to this location.

## 2023-06-20 NOTE — ED Provider Notes (Signed)
 Behavioral Health Urgent Care Medical Screening Exam  Patient Name: Lindsay Gomez MRN: [id number] Date of Evaluation: 06/20/23 Chief Complaint: Worsening depressive symptoms & anxiety Diagnosis:  Final diagnoses:  Moderate episode of recurrent major depressive disorder (HCC)   History of Present illness: Lindsay Gomez is a 53 y.o. female with reported prior mental health diagnoses of GAD & MDD who presented to this [facility] behavioral health [facility]) unaccompanied today with complaints of worsening depressive symptoms & anxiety in the context of multiple psychosocial stressors.  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 She reports that there are a total of 3 children in her classroom with behavioral problems, one with autism, one with ODD, and one with frequent behavioral outbursts. She talks about the classroom being difficult to manage, shares that child's mother is not helping with getting the resources that the child needs, principle does not know how to get the resources.   Pt reports another stressor as being the loss of multiple family members over the years; the death of grandmother in 2015/07/22, grand father in 07/21/16, father in 07/22/2018, best friend in 2019/07/22, boyfriend of 5 years in 07/21/21. Pt states that she has had a difficult time dealing with the losses. States that she is in therapy, it is slightly helpful.  She reports insomnia, anhedonia, states "nothing makes me happy any more", reports a decreased energy level, trouble wth concentration, overeating leading to a lot of weight gain over the past few months, reports isolating self, & not wanting to be around people, reports racing  thoughts.   Denies manic type symptoms, denies psychosis, denies substance use, states has had "Edibles" twice in her life last year thanksgiving, otherwise, she does not use any substances of abuse. Denies SI/HI/AVH, denies paranoia, denies delusional thinking, there are no overt signs of psychosis, denies first rank symptoms. Denies past suicide attempts, denies past inpatient hospitalizations. Denies having a current outpatient mental health provider.  Pt reports a medical history of diabetes & hypertension, shares that she is on medications for this, asking for medications for management of her depressive symptoms & anxiety, stating that she would like "something natural". Writer educated pt that we are unable to prescribe medications to her here at the urgent care as there is no way to ensure continuity of care. Writer educated pt that she can take Melatonin 3 mg OTC for sleep, and follow up with outpatient for medication management. Information for the [facility] (Psychiatry outpatient) provided. Pt educated to call and make an appt. Education provided on the fact that if experiencing worsening of psychiatry symptoms including suicidal ideations, homicidal ideations, or having auditory/visual hallucinations, etc, to call 911, [phone], come back to this location, or go to the nearest ER. Pt verbalized understanding, educated that she may return to work on 4/9, as she verbalizes needing time to make the appointment. Patient also educated that it is recommended for her to take a short time off work to take care of her mental health. Verbalized understanding.  Flowsheet Row ED from 06/20/2023 in [facility] Admission (Discharged) from 03/08/2023 in [facility] ENDOSCOPY ED from 03/21/2022 in [facility] Emergency Department at [facility]  C-SSRS RISK CATEGORY No  Risk No Risk Low Risk       Psychiatric Specialty Exam  Presentation  General  Appearance:Appropriate for Environment; Well Groomed  Eye Contact:Good  Speech:Clear and Coherent  Speech Volume:Normal  Handedness:Right   Mood and Affect  Mood:Anxious; Depressed  Affect:Congruent   Thought Process  Thought Processes:Coherent  Descriptions of Associations:Intact  Orientation:Full (Time, Place and Person)  Thought Content:Logical    Hallucinations:None  Ideas of Reference:None  Suicidal Thoughts:No  Homicidal Thoughts:No   Sensorium  Memory:Immediate Fair  Judgment:Fair  Insight:Fair   Executive Functions  Concentration:Good  Attention Span:Good  Recall:Good  Fund of Knowledge:Good  Language:Fair   Psychomotor Activity  Psychomotor Activity:Normal   Assets  Assets:Resilience   Sleep  Sleep:Poor  Number of hours: No data recorded  Physical Exam: Physical Exam Vitals and nursing note reviewed.  Neurological:     General: No focal deficit present.     Mental Status: She is oriented to person, place, and time.  Psychiatric:        Mood and Affect: Mood normal.        Behavior: Behavior normal.        Thought Content: Thought content normal.        Judgment: Judgment normal.    Review of Systems  Psychiatric/Behavioral:  Positive for depression (Denies SI/HI, denies plan or intent to harm self or someone else). Negative for hallucinations, memory loss, substance abuse and suicidal ideas. The patient is nervous/anxious and has insomnia.   All other systems reviewed and are negative.  Blood pressure (!) 127/97, pulse 88, temperature 98 F (36.7 C), temperature source Oral, resp. rate 16, last menstrual period 12/18/2014, SpO2 100%. There is no height or weight on file to calculate BMI.  Musculoskeletal: Strength & Muscle Tone: within normal limits Gait & Station: normal Patient leans: N/A   [facility] MSE Discharge Disposition for Follow up and Recommendations: Based on my evaluation the patient does not appear to have  an emergency medical condition and can be discharged with resources and follow up care in outpatient services for Medication Management -Discharged with resources for outpatient follow up. Provided with information for the [facility] outpatient at [facility], and educated to call on 4/7 for an outpatient behavioral health appointment. Case discussed with Dr. Loleta Chance who is agreeable to plan. Starleen Blue, NP 06/20/2023, 9:45 AM

## 2023-06-20 NOTE — ED Notes (Signed)
 Pt dc'd by NP Dorris, AVS taken to pt by NP. Pt walked out by NP as well. No further concerns.

## 2023-07-10 ENCOUNTER — Ambulatory Visit (HOSPITAL_BASED_OUTPATIENT_CLINIC_OR_DEPARTMENT_OTHER): Admitting: Child and Adolescent Psychiatry

## 2023-07-10 DIAGNOSIS — F331 Major depressive disorder, recurrent, moderate: Secondary | ICD-10-CM | POA: Diagnosis not present

## 2023-07-10 DIAGNOSIS — F4322 Adjustment disorder with anxiety: Secondary | ICD-10-CM | POA: Insufficient documentation

## 2023-07-10 MED ORDER — SERTRALINE HCL 50 MG PO TABS: ORAL_TABLET | ORAL | 0 refills | Status: DC

## 2023-07-10 MED ORDER — TRAZODONE HCL 50 MG PO TABS: 25.0000 mg | ORAL_TABLET | Freq: Every evening | ORAL | 1 refills | Status: DC | PRN

## 2023-07-10 NOTE — Progress Notes (Signed)
 Psychiatric Initial Adult Assessment   Patient Identification: Lindsay Gomez MRN:  [id number] Date of Evaluation:  07/10/2023 Referral Source: [facility] Chief Complaint:  No chief complaint on file.  Visit Diagnosis:    ICD-10-CM   1. Moderate episode of recurrent major depressive disorder (HCC)  F33.1     2. 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  She was evaluated and subsequently discharged with recommendation to establish outpatient medication management.  She was seen and evaluated today for initial psychiatric evaluation and to establish medication management.  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  She reported that in her class she has a Consulting civil engineer who lives diagnosed with oppositional and defiant disorder, when she does not get her way, she yells, throws things, kicks.  She has another student with autism spectrum disorder in her class, who gets overwhelmed when this happens, starts to run away.  This was all caused a lot of stress for her, she has anxiety about going to her work, wakes up in the middle of the night and then unable to go to sleep with her racing thoughts.  Additionally she reported that she has been struggling with depression since Aug 14, 2018 after her father passed away.  She has had multiple losses in her family since 15 5-20 23.  Lost GM in 2015/08/14, GF 2018 and father in 2018/08/14, close friend in 08-14-2019, her boy friend died of medical  complication following ankle surgery in November 2023 and recently lost anut and uncle within a week of each other.   She reported that her current stressors from school and her last few years has been taking a toll on her mentally and physically and therefore decided to seek help.  She has been seeing therapist about once every 2 weeks virtually since the beginning of this year however it has not been very effective.  She reported that she needs something more to regulate herself.  When asked her to describe her depression, she reported that it is very debilitating, she sleeps a lot, don't want to go around people, tired, feels lifeless, difficult to complete task, house is a mess.  She reported that within last 6 months she has had occasional passive SI but not within the last month or recently.  She denies history of active suicidal thoughts or attempts.  She describes her current mood as "number", often depressed, anxious and tired.  She reported that she has difficulties going to sleep, when she is asleep, she wakes up after 2 or 3 hours.  Her depression and anxiety has been affecting her job and personal life.  She reported that it makes it difficult for her to complete the task, not interested or very dedicated as she used to be at her job.  She reported that symptoms of depression began after the father's death, gradually worsened over the time and recently escalated in the context of stressors at school.  She reported that she has worries about her  future, her functioning and injuring life again but does not have difficulties to relax and anxiety.  Otherwise manageable except anxiety about her work.  In regards of her trauma history, she reported that she herself has not been involved in any car accident but she still cannot explain the current front of her, does not poses flashbacks of the accidents, intrusive memories, she is anxious at the intersections while driving but otherwise able to  drive as well.  She also reported that her boyfriend's death was traumatic to her, she gets flashbacks and intrusive memories of her experience with her boyfriend's hospitalizations.  She does not get anxious if she goes to Hospital clinic and in fact it relieves her anxiety.  She denied SI, HI, AVH, did not admit any delusions.  She denied substance abuse, denied symptoms consistent with mania or hypomania.  Past Psychiatric History:   Inpatient: None, had one ER visit earlier 2024 RTC: None Outpatient:     - Meds: None reported    - Therapy: Ms. Bethanie Brooking since Jan 2025 @ [facility] sees her every other week.   Hx of SI/HI: None   Previous Psychotropic Medications: No   Substance Abuse History in the last 12 months:  No.  Consequences of Substance Abuse: NA  Past Medical History:  Past Medical History:  Diagnosis Date   Allergy    rhinitis   Anemia    Anxiety    Atrial fibrillation (HCC)    CHF (congestive heart failure) (HCC)    Depression    Diabetes mellitus without complication (HCC)    glucose usually 140-150   Enlarged heart    Hyperlipidemia    Hypertension    Morbid obesity (HCC)    Shortness of breath dyspnea    with low iron   Sleep apnea    Stroke [facility])     Past Surgical History:  Procedure Laterality Date   ABDOMINAL HYSTERECTOMY N/A 01/29/2015   Procedure: TOTAL ABDOMINAL HYSTERECTOMY ;  Surgeon: Lula Sale, MD;  Location: [facility];  Service: Gynecology;  Laterality: N/A;   BILATERAL SALPINGECTOMY Bilateral 01/29/2015   Procedure: BILATERAL SALPINGECTOMY;  Surgeon: Lula Sale, MD;  Location: [facility];  Service: Gynecology;  Laterality: Bilateral;   BIOPSY  03/08/2023   Procedure: BIOPSY;  Surgeon: Asencion Blacksmith, MD;  Location: [facility];  Service: Gastroenterology;;   CESAREAN SECTION     COLONOSCOPY WITH PROPOFOL  N/A 03/08/2023   Procedure: COLONOSCOPY WITH PROPOFOL ;  Surgeon: Asencion Blacksmith, MD;  Location: [facility];   Service: Gastroenterology;  Laterality: N/A;   ESOPHAGOGASTRODUODENOSCOPY (EGD) WITH PROPOFOL  N/A 03/08/2023   Procedure: ESOPHAGOGASTRODUODENOSCOPY (EGD) WITH PROPOFOL ;  Surgeon: Asencion Blacksmith, MD;  Location: [facility];  Service: Gastroenterology;  Laterality: N/A;   KNEE ARTHROSCOPY Right 03/16/1990   POLYPECTOMY  03/08/2023   Procedure: POLYPECTOMY;  Surgeon: Asencion Blacksmith, MD;  Location: [facility];  Service: Gastroenterology;;   SUBMUCOSAL TATTOO INJECTION  03/08/2023   Procedure: SUBMUCOSAL TATTOO INJECTION;  Surgeon: Asencion Blacksmith, MD;  Location: [facility];  Service: Gastroenterology;;    Family Psychiatric History:   Daughters and oldest brother(developed depression after the stroke) - Depression and Anxiety  Father and brother - PTSD  Family History:  Family History  Problem Relation Age of Onset   Hypertension Mother    Diabetes Mother    Coronary artery disease Father 78   Heart failure Father    Heart attack Father    Diabetes Father    Multiple  myeloma Father    Stroke Brother 46   Pancreatic cancer Brother    Ovarian cancer Paternal Grandmother    Dementia Paternal Grandmother    Heart disease Paternal Grandfather    Kidney disease Paternal Grandfather    Hypertension Other    Hyperlipidemia Other    Lupus Daughter    Diabetes Daughter    Anxiety disorder Daughter    Depression Daughter    Obesity Daughter    Obesity Daughter    Esophageal cancer Neg Hx    Colon cancer Neg Hx    Stomach cancer Neg Hx    Atrial fibrillation Neg Hx     Social History:   Social History   Socioeconomic History   Marital status: Single    Spouse name: Not on file   Number of children: 2   Years of education: Not on file   Highest education level: Not on file  Occupational History   Occupation: Kindergarten Runner, broadcasting/film/video    Comment: Programmer, multimedia  Tobacco Use   Smoking status: Never   Smokeless tobacco: Never  Vaping Use   Vaping status: Never Used   Substance and Sexual Activity   Alcohol use: No   Drug use: No   Sexual activity: Not Currently    Birth control/protection: None  Other Topics Concern   Not on file  Social History Narrative   Pt lives alone in 2 story home   Has 2 children   Masters degree   Works as an Programmer, systems    Social Drivers of Corporate investment banker Strain: Not on file  Food Insecurity: Low Risk  (05/28/2023)   Received from [facility]   Hunger Vital Sign    Worried About Running Out of Food in the Last Year: Never true    Ran Out of Food in the Last Year: Never true  Transportation Needs: No Transportation Needs (05/28/2023)   Received from [facility]    In the past 12 months, has lack of reliable transportation kept you from medical appointments, meetings, work or from getting things needed for daily living? : No  Physical Activity: Not on file  Stress: Not on file  Social Connections: Not on file    Additional Social History:  Lives in [place] by her self, in [place] since 1993.  Has two daughters 45 and 69 years old.  Not married.  Works at [employer] - teaches at [employer] in [place].    Allergies:   Allergies  Allergen Reactions   Lisinopril Other (See Comments)    Cough    Metformin  Hcl Other (See Comments)   Trulicity  [Dulaglutide ] Other (See Comments)    Metabolic Disorder Labs: Lab Results  Component Value Date   HGBA1C 12.9 (A) 05/04/2023   MPG 162.81 05/03/2021   MPG 243 08/11/2020   No results found for: "PROLACTIN" Lab Results  Component Value Date   CHOL 128 04/06/2022   TRIG 68 04/06/2022   HDL 53 04/06/2022   CHOLHDL 4.2 05/03/2021   VLDL 19 05/03/2021   LDLCALC 61 04/06/2022   LDLCALC 113 (H) 05/03/2021   Lab Results  Component Value Date   TSH 1.092 05/03/2021    Therapeutic Level Labs: No results found for: "LITHIUM" No results found for: "CBMZ" No results found for: "VALPROATE"  Current  Medications: Current Outpatient Medications  Medication Sig Dispense Refill   Insulin  NPH, Human,, Isophane, (NOVOLIN  N FLEXPEN) 100 UNIT/ML Kiwkpen Inject 16 Units into the skin.  sertraline (ZOLOFT) 50 MG tablet Take 0.5 tablets (25 mg total) by mouth daily for 14 days, THEN 1 tablet (50 mg total) daily. 37 tablet 0   traZODone (DESYREL) 50 MG tablet Take 0.5-1 tablets (25-50 mg total) by mouth at bedtime as needed for sleep. 30 tablet 1   Azilsartan-Chlorthalidone  40-25 MG TABS Take 1 tablet by mouth daily.     bismuth subsalicylate (PEPTO BISMOL) 262 MG chewable tablet Chew 524 mg by mouth as needed for indigestion or diarrhea or loose stools.     Continuous Glucose Sensor (FREESTYLE LIBRE 2 SENSOR) MISC Change every 14 days 6 each 3   diltiazem  (CARDIZEM  CD) 120 MG 24 hr capsule Take 1 capsule by mouth daily.     ELIQUIS  5 MG TABS tablet Take 1 tablet (5 mg total) by mouth 2 (two) times daily. 60 tablet 2   Insulin  NPH, Human,, Isophane, (NOVOLIN  N FLEXPEN) 100 UNIT/ML Kiwkpen Inject 22 Units into the skin in the morning and at bedtime. 45 mL 4   Insulin  Pen Needle 32G X 4 MM MISC 1 Device by Does not apply route in the morning, at noon, in the evening, and at bedtime. 400 each 3   Semaglutide ,0.25 or 0.5MG /DOS, 2 MG/3ML SOPN Inject 0.5 mg into the skin once a week. 9 mL 3   No current facility-administered medications for this visit.    Musculoskeletal:  Gait & Station: normal Patient leans: N/A  Psychiatric Specialty Exam: Review of Systems  Last menstrual period 12/18/2014.There is no height or weight on file to calculate BMI.  General Appearance: Casual and Well Groomed  Eye Contact:  Good  Speech:  Clear and Coherent and Normal Rate  Volume:  Normal  Mood:   "numb.."  Affect:  Appropriate, Congruent, and Restricted  Thought Process:  Goal Directed and Linear  Orientation:  Full (Time, Place, and Person)  Thought Content:  Logical  Suicidal Thoughts:  No  Homicidal  Thoughts:  No  Memory:  Immediate;   Good Recent;   Good Remote;   Good  Judgement:  Good  Insight:  Good  Psychomotor Activity:  Normal  Concentration:  Concentration: Good and Attention Span: Good  Recall:  Good  Fund of Knowledge:Good  Language: Good  Akathisia:  No    AIMS (if indicated):  not done  Assets:  Communication Skills Desire for Improvement Financial Resources/Insurance Housing Leisure Time Physical Health Social Support Transportation Vocational/Educational  ADL's:  Intact  Cognition: WNL  Sleep:  Good   Screenings: PHQ2-9    Flowsheet Row Nutrition from 05/13/2017 in [facility]ab [facility]  - A Dept Of [facility] Nutrition from 08/15/2015 in [facility]ab [facility]  - A Dept Of . [facility]  PHQ-2 Total Score 4 0      Flowsheet Row ED from 06/20/2023 in [facility] Admission (Discharged) from 03/08/2023 in [facility] ENDOSCOPY ED from 03/21/2022 in [facility] Department at [facility]  C-SSRS RISK CATEGORY No Risk No Risk Low Risk       Assessment and Plan:   53 year old female with medical history significant of diabetes, hypertension, stroke, and psychiatric history significant of depression, anxiety with no previous outpatient medication management.  She is currently receiving outpatient psychotherapy every other week since the beginning of this year.    She continues with predisposition to depression and anxiety disorders, reports symptoms most consistent with major depressive disorder that  appears to have precipitated in the context of her father's death and subsequent multiple psychosocial stressors.  This year she has been also struggling with anxiety related to work because of stressors at work as mentioned above in HPI.  This seems to have perpetrated and worsened her symptoms of depression as well as close anxiety.  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    Due to limited response to outpatient psychotherapy, we considered medication management.  Discussed indications for Zoloft, discussed side effects, risks, benefits and alternatives.  Side effects including but not limited to black box warning associated with Zoloft discussed with patient.  Patient provided verbal informed consent to initiate Zoloft.  He also discussed trial of trazodone to help with sleep, she also provided verbal informed consent for trazodone.  She asked if genetic test is required to identify medication for her treatment.  Discussed current guidelines for treatment of depression and anxiety and did not recommend pharmaco genomic testing at this time.  She verbalized understanding.    1. Moderate episode of recurrent major depressive disorder (HCC) - Start Zoloft 25 mg for 2 weeks and then increase it to 50 mg once daily. - Continue individual therapy every 2 weeks. - Start trazodone 25 to 50 mg as needed for sleep.  2. Adjustment disorder with anxious mood -Same as above  Total time spent of date of service was 60 minutes.  Patient care activities included preparing to see the patient such as reviewing the patient's record, obtaining history from parent, performing a medically appropriate history and mental status examination, counseling and educating the patient, on diagnosis, treatment plan, medications, medications side effects, ordering prescription medications, documenting clinical information in the electronic for other health record, medication side effects. and coordinating the care of the patient when not separately reported.  This note was generated in part or whole with voice recognition software. Voice recognition is usually quite accurate but there are transcription errors that can and very often do  occur. I apologize for any typographical errors that were not detected and corrected.   Collaboration of Care: Other N/A  Patient/Guardian was advised Release of Information must be obtained prior to any record release in order to collaborate their care with an outside provider. Patient/Guardian was advised if they have not already done so to contact the registration department to sign all necessary forms in order for us  to release information regarding their care.   Consent: Patient/Guardian gives verbal consent for treatment and assignment of benefits for services provided during this visit. Patient/Guardian expressed understanding and agreed to proceed.   Pilar Bridge, MD 4/26/202511:38 AM

## 2023-08-23 NOTE — Progress Notes (Unsigned)
 Psychiatric Initial Adult Assessment   Patient Identification: Lindsay Gomez MRN:  [id number] Date of Evaluation:  08/23/2023 Referral Source: Dr. Mee Spillers Chief Complaint:  No chief complaint on file.  Visit Diagnosis: No diagnosis found.   Assessment:  Lindsay Gomez is a 53 y.o. female with a history of depression, anxiety, diabetes, hypertension, and stroke who presents in person to [facility] at [facility] for initial evaluation on 08/23/2023.    At initial evaluation patient reports ***   She continues with predisposition to depression and anxiety disorders, reports symptoms most consistent with major depressive disorder that appears to have precipitated in the context of her father's death and subsequent multiple psychosocial stressors.  This year she has been also struggling with anxiety related to work because of stressors at work as mentioned above in HPI.  This seems to have perpetrated and worsened her symptoms of depression as well as close anxiety.  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     A number of assessments were performed during the evaluation today including  PHQ-9 which they scored a *** on, GAD-7 which they scored a *** on, and [place] suicide severity screening which showed ***.    Risk Assessment: A suicide and violence risk assessment was performed as part of this evaluation. There patient is deemed to be at chronic elevated risk for self-harm/suicide given the following factors: {SABSUICIDERISKFACTORS:29780}. These risk factors are mitigated by the following factors: {SABSUICIDEPROTECTIVEFACTORS:29779}. The patient is deemed to be at chronic elevated risk for violence given the following factors: {SABVIOLENCERISKFACTORS:29781}. These risk factors are mitigated by the following factors:  {SABVIOLENCEPROTECTIVEFACTORS:29782}. There is no acute risk for suicide or violence at this time. The patient was educated about relevant modifiable risk factors including following recommendations for treatment of psychiatric illness and abstaining from substance abuse.  While future psychiatric events cannot be accurately predicted, the patient does not currently require  acute inpatient psychiatric care and does not currently meet [place]  involuntary commitment criteria.  Patient was given contact information for crisis resources, behavioral health clinic and was instructed to call 911 for emergencies.    Plan: # ***Moderate episode of recurrent major depressive disorder (HCC) Past medication trials: None Status of problem: Ongoing Interventions: - Start Zoloft  25 mg for 2 weeks and then increase it to 50 mg once daily. - Continue individual therapy every 2 weeks. - Start trazodone  25 to 50 mg as needed for sleep.  # ***Adjustment disorder with anxious mood Past medication trials: None Status of problem: Ongoing Interventions: - Same as above  # *** Past medication trials:  Status of problem: *** Interventions: -- ***   History of Present Illness:  ***Patient presents following transition from Dr. Mee Spillers after she presented from [facility] due to worsening anxiety and depression.    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  She reported that in her class she has a Consulting civil engineer who lives diagnosed with oppositional and defiant disorder, when she does not get her way, she yells, throws things, kicks.  She has another student with autism spectrum disorder in her class, who gets overwhelmed when this happens, starts to run away.  This was all caused a lot of stress for her, she has anxiety about going to her work, wakes up  in the middle of the night and then unable to go to sleep with her racing  thoughts.  Additionally she reported that she has been struggling with depression since 09/15/2018 after her father passed away.  She has had multiple losses in her family since 69 28-20 23.  Lost GM in September 15, 2015, GF 2018 and father in 2018-09-15, close friend in September 15, 2019, her boy friend died of medical complication following ankle surgery in November 2023 and recently lost anut and uncle within a week of each other.   She reported that her current stressors from school and her last few years has been taking a toll on her mentally and physically and therefore decided to seek help.  She has been seeing therapist about once every 2 weeks virtually since the beginning of this year however it has not been very effective.  She reported that she needs something more to regulate herself.  When asked her to describe her depression, she reported that it is very debilitating, she sleeps a lot, don't want to go around people, tired, feels lifeless, difficult to complete task, house is a mess.  She reported that within last 6 months she has had occasional passive SI but not within the last month or recently.  She denies history of active suicidal thoughts or attempts.  She describes her current mood as "number", often depressed, anxious and tired.  She reported that she has difficulties going to sleep, when she is asleep, she wakes up after 2 or 3 hours.  Her depression and anxiety has been affecting her job and personal life.  She reported that it makes it difficult for her to complete the task, not interested or very dedicated as she used to be at her job.  She reported that symptoms of depression began after the father's death, gradually worsened over the time and recently escalated in the context of stressors at school.  She reported that she has worries about her future, her functioning and injuring life again but does not have difficulties to relax and anxiety.  Otherwise manageable except anxiety about her work.  In  regards of her trauma history, she reported that she herself has not been involved in any car accident but she still cannot explain the current front of her, does not poses flashbacks of the accidents, intrusive memories, she is anxious at the intersections while driving but otherwise able to drive as well.  She also reported that her boyfriend's death was traumatic to her, she gets flashbacks and intrusive memories of her experience with her boyfriend's hospitalizations.  She does not get anxious if she goes to Hospital clinic and in fact it relieves her anxiety.  She denied SI, HI, AVH, did not admit any delusions.  She denied substance abuse, denied symptoms consistent with mania or hypomania.  Past Psychiatric History:   Inpatient:  RTC: None Outpatient:     - Meds: None reported    - Therapy: .   Hx of SI/HI: None  Past Psychiatric History:  Past psychiatric diagnoses: *** Psychiatric hospitalizations:None, had one ER visit earlier 15-Sep-2022 Past suicide attempts: Denies Hx of self harm: Denies Hx of violence towards others: *** Prior psychiatric providers: Denies other then meeting with Dr. Mee Spillers [facility] Prior therapy: Ms. Bethanie Brooking since Jan 2025 @ [facility] sees her every other week Access to firearms: ***  Prior medication trials: ***  Substance use: *** Denies  Past Medical History:  Past Medical  History:  Diagnosis Date   Allergy    rhinitis   Anemia    Anxiety    Atrial fibrillation (HCC)    CHF (congestive heart failure) (HCC)    Depression    Diabetes mellitus without complication (HCC)    glucose usually 140-150   Enlarged heart    Hyperlipidemia    Hypertension    Morbid obesity (HCC)    Shortness of breath dyspnea    with low iron   Sleep apnea    Stroke [facility])     Past Surgical History:  Procedure Laterality Date   ABDOMINAL HYSTERECTOMY N/A 01/29/2015   Procedure: TOTAL ABDOMINAL HYSTERECTOMY ;   Surgeon: Lula Sale, MD;  Location: [facility];  Service: Gynecology;  Laterality: N/A;   BILATERAL SALPINGECTOMY Bilateral 01/29/2015   Procedure: BILATERAL SALPINGECTOMY;  Surgeon: Lula Sale, MD;  Location: [facility];  Service: Gynecology;  Laterality: Bilateral;   BIOPSY  03/08/2023   Procedure: BIOPSY;  Surgeon: Asencion Blacksmith, MD;  Location: [facility];  Service: Gastroenterology;;   CESAREAN SECTION     COLONOSCOPY WITH PROPOFOL  N/A 03/08/2023   Procedure: COLONOSCOPY WITH PROPOFOL ;  Surgeon: Asencion Blacksmith, MD;  Location: [facility];  Service: Gastroenterology;  Laterality: N/A;   ESOPHAGOGASTRODUODENOSCOPY (EGD) WITH PROPOFOL  N/A 03/08/2023   Procedure: ESOPHAGOGASTRODUODENOSCOPY (EGD) WITH PROPOFOL ;  Surgeon: Asencion Blacksmith, MD;  Location: [facility];  Service: Gastroenterology;  Laterality: N/A;   KNEE ARTHROSCOPY Right 03/16/1990   POLYPECTOMY  03/08/2023   Procedure: POLYPECTOMY;  Surgeon: Asencion Blacksmith, MD;  Location: [facility];  Service: Gastroenterology;;   [place] TATTOO INJECTION  03/08/2023   Procedure: SUBMUCOSAL TATTOO INJECTION;  Surgeon: Asencion Blacksmith, MD;  Location: [facility];  Service: Gastroenterology;;    Family Psychiatric History:  Daughters and oldest brother(developed depression after the stroke) - Depression and Anxiety  Father and brother - PTSD  Family History:  Family History  Problem Relation Age of Onset   Hypertension Mother    Diabetes Mother    Coronary artery disease Father 73   Heart failure Father    Heart attack Father    Diabetes Father    Multiple myeloma Father    Stroke Brother 51   Pancreatic cancer Brother    Ovarian cancer Paternal Grandmother    Dementia Paternal Grandmother    Heart disease Paternal Grandfather    Kidney disease Paternal Grandfather    Hypertension Other    Hyperlipidemia Other    Lupus Daughter    Diabetes Daughter    Anxiety disorder Daughter    Depression Daughter     Obesity Daughter    Obesity Daughter    Esophageal cancer Neg Hx    Colon cancer Neg Hx    Stomach cancer Neg Hx    Atrial fibrillation Neg Hx     Social History:   Social History   Socioeconomic History   Marital status: Single    Spouse name: Not on file   Number of children: 2   Years of education: Not on file   Highest education level: Not on file  Occupational History   Occupation: Kindergarten Runner, broadcasting/film/video    Comment: Programmer, multimedia  Tobacco Use   Smoking status: Never   Smokeless tobacco: Never  Vaping Use   Vaping status: Never Used  Substance and Sexual Activity   Alcohol use: No   Drug use: No   Sexual activity: Not Currently    Birth control/protection: None  Other Topics Concern  Not on file  Social History Narrative   Pt lives alone in 2 story home   Has 2 children   Masters degree   Works as an Programmer, systems    Social Drivers of Corporate investment banker Strain: Not on [employer] Insecurity: Low Risk  (05/28/2023)   Received from [facility]   Hunger Vital Sign    Worried About Running Out of Food in the Last Year: Never true    Ran Out of Food in the Last Year: Never true  Transportation Needs: No Transportation Needs (05/28/2023)   Received from [facility]    In the past 12 months, has lack of reliable transportation kept you from medical appointments, meetings, work or from getting things needed for daily living? : No  Physical Activity: Not on file  Stress: Not on file  Social Connections: Not on file    Additional Social History: *** Lives in [place] by her self, in [place] since 1993.  Has two daughters 34 and 8 years old.  Not married.  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   Allergies:   Allergies  Allergen Reactions   Lisinopril Other (See Comments)    Cough    Metformin  Hcl Other (See Comments)   Trulicity  [Dulaglutide ] Other (See Comments)    Metabolic Disorder Labs: Lab  Results  Component Value Date   HGBA1C 12.9 (A) 05/04/2023   MPG 162.81 05/03/2021   MPG 243 08/11/2020   No results found for: "PROLACTIN" Lab Results  Component Value Date   CHOL 128 04/06/2022   TRIG 68 04/06/2022   HDL 53 04/06/2022   CHOLHDL 4.2 05/03/2021   VLDL 19 05/03/2021   LDLCALC 61 04/06/2022   LDLCALC 113 (H) 05/03/2021   Lab Results  Component Value Date   TSH 1.092 05/03/2021    Therapeutic Level Labs: No results found for: "LITHIUM" No results found for: "CBMZ" No results found for: "VALPROATE"  Current Medications: Current Outpatient Medications  Medication Sig Dispense Refill   Azilsartan-Chlorthalidone  40-25 MG TABS Take 1 tablet by mouth daily.     bismuth subsalicylate (PEPTO BISMOL) 262 MG chewable tablet Chew 524 mg by mouth as needed for indigestion or diarrhea or loose stools.     Continuous Glucose Sensor (FREESTYLE LIBRE 2 SENSOR) MISC Change every 14 days 6 each 3   diltiazem  (CARDIZEM  CD) 120 MG 24 hr capsule Take 1 capsule by mouth daily.     ELIQUIS  5 MG TABS tablet Take 1 tablet (5 mg total) by mouth 2 (two) times daily. 60 tablet 2   Insulin  NPH, Human,, Isophane, (NOVOLIN  N FLEXPEN) 100 UNIT/ML Kiwkpen Inject 22 Units into the skin in the morning and at bedtime. 45 mL 4   Insulin  NPH, Human,, Isophane, (NOVOLIN  N FLEXPEN) 100 UNIT/ML Kiwkpen Inject 16 Units into the skin.     Insulin  Pen Needle 32G X 4 MM MISC 1 Device by Does not apply route in the morning, at noon, in the evening, and at bedtime. 400 each 3   Semaglutide ,0.25 or 0.5MG /DOS, 2 MG/3ML SOPN Inject 0.5 mg into the skin once a week. 9 mL 3   sertraline  (ZOLOFT ) 50 MG tablet Take 0.5 tablets (25 mg total) by mouth daily for 14 days, THEN 1 tablet (50 mg total) daily. 37 tablet 0   traZODone  (DESYREL ) 50 MG tablet Take 0.5-1 tablets (25-50 mg total) by mouth at bedtime as needed for sleep. 30 tablet  1   No current facility-administered medications for this visit.     Musculoskeletal: Strength & Muscle Tone: {desc; muscle tone:32375} Gait & Station: {PE GAIT ED ZOXW:96045} Patient leans: {Patient Leans:21022755}  Psychiatric Specialty Exam:  Psychiatric Specialty Exam: Last menstrual period 12/18/2014.There is no height or weight on file to calculate BMI. Review of Systems  General Appearance: {Appearance:22683}  Eye Contact:  {BHH EYE CONTACT:22684}  Speech:  {Speech:22685}  Volume:  {Volume (PAA):22686}  Mood:  {BHH MOOD:22306}  Affect:  {Affect (PAA):22687}  Thought Content: {Thought Content:22690}   Suicidal Thoughts:  {ST/HT (PAA):22692}  Homicidal Thoughts:  {ST/HT (PAA):22692}  Thought Process:  {Thought Process (PAA):22688}  Orientation:  {BHH ORIENTATION (PAA):22689}    Memory: {BHH MEMORY:22881}  Judgment:  {Judgement (PAA):22694}  Insight:  {Insight (PAA):22695}  Concentration:  {Concentration:21399}  Recall:  not formally assessed ***  Fund of Knowledge: {BHH GOOD/FAIR/POOR:22877}  Language: {BHH GOOD/FAIR/POOR:22877}  Psychomotor Activity:  {Psychomotor (PAA):22696}  Akathisia:  {BHH YES OR NO:22294}  AIMS (if indicated): {Desc; done/not:10129}  Assets:  {Assets (PAA):22698}  ADL's:  {BHH WUJ'W:11914}  Cognition: {chl bhh cognition:304700322}  Sleep:  {BHH GOOD/FAIR/POOR:22877}    Screenings: PHQ2-9    Flowsheet Row Nutrition from 05/13/2017 in [facility]  - A Dept Of [facility] Nutrition from 08/15/2015 in [facility]  - A Dept Of [facility]  PHQ-2 Total Score 4 0      Flowsheet Row ED from 06/20/2023 in [facility] Admission (Discharged) from 03/08/2023 in [facility] ENDOSCOPY ED from 03/21/2022 in [facility] Emergency Department at [facility]  C-SSRS RISK CATEGORY No Risk No Risk Low Risk        Collaboration of Care: {BH OP Collaboration of Care:21014065}  Patient/Guardian was advised  Release of Information must be obtained prior to any record release in order to collaborate their care with an outside provider. Patient/Guardian was advised if they have not already done so to contact the registration department to sign all necessary forms in order for us  to release information regarding their care.   Consent: Patient/Guardian gives verbal consent for treatment and assignment of benefits for services provided during this visit. Patient/Guardian expressed understanding and agreed to proceed.   Yves Herb, MD 6/9/202510:14 AM

## 2023-08-24 ENCOUNTER — Ambulatory Visit (HOSPITAL_BASED_OUTPATIENT_CLINIC_OR_DEPARTMENT_OTHER): Admitting: Psychiatry

## 2023-08-24 ENCOUNTER — Encounter (HOSPITAL_COMMUNITY): Payer: Self-pay | Admitting: Psychiatry

## 2023-08-24 VITALS — BP 142/90 | HR 91 | Ht 62.0 in | Wt 274.5 lb

## 2023-08-24 DIAGNOSIS — F331 Major depressive disorder, recurrent, moderate: Secondary | ICD-10-CM

## 2023-08-24 DIAGNOSIS — F4322 Adjustment disorder with anxiety: Secondary | ICD-10-CM

## 2023-08-25 ENCOUNTER — Telehealth (HOSPITAL_COMMUNITY): Payer: Self-pay | Admitting: *Deleted

## 2023-08-25 NOTE — Telephone Encounter (Signed)
 GeneSight sample obtained 08/24/23 in office from pt. Consent signed and sample awaiting [employer] pickup.

## 2023-09-08 ENCOUNTER — Ambulatory Visit: Payer: 59 | Admitting: Internal Medicine

## 2023-10-11 ENCOUNTER — Telehealth (HOSPITAL_COMMUNITY): Payer: Self-pay

## 2023-10-11 NOTE — Telephone Encounter (Signed)
 Patient called today about her Genesight results. She has a follow up next month, and is wanting to know if she should move her appointment up. I will print the test and put it in your mailbox. Please review and advise, thank you

## 2023-10-11 NOTE — Progress Notes (Unsigned)
 BH MD/PA/NP OP Progress Note  10/12/2023 3:26 PM Lindsay Gomez  MRN:  [id number]  Visit Diagnosis:    ICD-10-CM   1. Moderate episode of recurrent major depressive disorder (HCC)  F33.1 Vilazodone  HCl (VIIBRYD ) 10 MG TABS    2. Adjustment disorder with anxious mood  F43.22       Assessment: Lindsay Gomez is a 53 y.o. female with a history of depression, anxiety, diabetes, hypertension, and stroke who presented to [facility] for initial evaluation on 08/24/2023.    At initial evaluation patient reported history of anxiety and depression for which she does have a predisposition.  While she had endorsed symptoms consistent with major depressive disorder in the past as well as significant anxiety symptoms these have improved substantially during the 2 weeks prior to initial evaluation.  This was related to an improvement in work stressors which was the primary trigger for the increase in anxiety and depression.  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  Patient met criteria for MDD and adjustment disorder with anxious mood at initial evaluation.  Given improvement of symptoms we will hold off on medication at this time.  Patient will follow up in 2 months.  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  Particularly as patient has not received training in how to manage the students and this past year there have been significant impact on her mental and physical wellbeing secondary to this.  Lindsay Gomez presents for follow-up evaluation. 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  Given this patient was open to trialing medication and we reviewed the genesite testing.  We will start patient on Viibryd  10 mg daily and reviewed the risks and benefits.   Risk Assessment: An assessment of suicide and violence risk factors was performed as part of this evaluation and is not significantly changed from the last visit. While future psychiatric events cannot be accurately predicted, the patient does not currently require acute inpatient psychiatric care and does not currently meet [place]  involuntary commitment criteria. Patient was given contact information for crisis resources, behavioral health clinic and was instructed to call 911 for emergencies.   Plan: # Moderate episode of recurrent major depressive disorder (HCC) Past medication trials: None Status of problem: Ongoing Interventions: - Start  Viibryd  10 mg daily - Continue individual therapy every 2 weeks.   # Adjustment disorder with anxious mood Past medication trials: None Status of problem: Ongoing Interventions: - Same  Chief Complaint:  Chief Complaint  Patient presents with   Follow-up   HPI: Lindsay Gomez reports that things are going great right now. She has enjoyed the summer doing things like going on vacation, going to [employer], and doing what she likes.  She has also been going to the gym.  The only significant negatives she reports is that she has gained some weight though attributes this to her diet.  Things had gone well up until the last few days where she started to get some impending anxiety and depression related to the upcoming school year.  It was this that led her to reaching out to discuss her  genesite testing as patient has decided that it might be best if she did try medication.  We reviewed the testing with her today which showed Viibryd  and Pristiq in the normal metabolizer category.  Zoloft  and Lexapro were both in the moderate metabolizer category.   Zoloft  had the stipulation that she might not respond to the same dose of medication as a standard patient.  We discussed starting with a trial of vibrate pending insurance approval which patient was agreeable to.  If there is insurance issue we will start Zoloft  instead and then transition to Viibryd  in the future if Zoloft  is unsuccessful.  Past Psychiatric History:  Past psychiatric diagnoses: Anxiety and depression and grief over the years, never had medicine brought up Psychiatric hospitalizations:None, had one ER visit earlier 2024 Past suicide attempts: Denies Hx of self harm: Denies Hx of violence towards others: Denies Prior psychiatric providers: Denies other then meeting with Dr. Susen once [facility] Prior therapy: [name] since Jan 2025 @ [facility] sees her every other week. Prior to that saw a therapist at [facility] for a year.  Access to firearms: denies  Prior medication trials: Prescribed Zoloft  and trazodone  but patient never started it.  Substance use: Denied any recent substance use. She did try a couple marijuana edibles in the past year, but nothing since November 2024.    Past Medical History:  Past Medical History:  Diagnosis Date   Allergy    rhinitis   Anemia    Anxiety    Atrial fibrillation (HCC)    CHF (congestive heart failure) (HCC)    Depression    Diabetes mellitus without complication (HCC)    glucose usually 140-150   Enlarged heart    Hyperlipidemia    Hypertension    Morbid obesity (HCC)    Shortness of breath dyspnea    with low iron   Sleep apnea    Stroke [facility])     Past Surgical History:  Procedure Laterality Date   ABDOMINAL HYSTERECTOMY N/A 01/29/2015   Procedure: TOTAL ABDOMINAL HYSTERECTOMY ;  Surgeon: Rome Rigg, MD;  Location: [facility];  Service: Gynecology;  Laterality: N/A;   BILATERAL SALPINGECTOMY Bilateral 01/29/2015   Procedure: BILATERAL SALPINGECTOMY;  Surgeon: Rome Rigg, MD;  Location: [facility];  Service: Gynecology;  Laterality: Bilateral;   BIOPSY  03/08/2023   Procedure: BIOPSY;  Surgeon: Aneita Gwendlyn DASEN, MD;  Location: [facility];  Service: Gastroenterology;;   CESAREAN SECTION     COLONOSCOPY WITH PROPOFOL  N/A 03/08/2023   Procedure: COLONOSCOPY WITH PROPOFOL ;  Surgeon: Aneita Gwendlyn DASEN, MD;  Location: [facility];  Service: Gastroenterology;  Laterality: N/A;   ESOPHAGOGASTRODUODENOSCOPY (EGD) WITH PROPOFOL  N/A 03/08/2023   Procedure: ESOPHAGOGASTRODUODENOSCOPY (EGD) WITH PROPOFOL ;  Surgeon: Aneita Gwendlyn DASEN, MD;  Location: [facility];  Service: Gastroenterology;  Laterality: N/A;   KNEE ARTHROSCOPY Right 03/16/1990   POLYPECTOMY  03/08/2023   Procedure: POLYPECTOMY;  Surgeon: Aneita Gwendlyn DASEN, MD;  Location: [facility];  Service: Gastroenterology;;   SUBMUCOSAL TATTOO INJECTION  03/08/2023   Procedure: SUBMUCOSAL TATTOO INJECTION;  Surgeon: Aneita Gwendlyn DASEN, MD;  Location: [facility];  Service: Gastroenterology;;   Family History:  Family History  Problem Relation Age of Onset   Hypertension Mother    Diabetes Mother    Coronary artery disease Father 1   Heart failure Father    Heart attack Father    Diabetes Father    Multiple myeloma Father  Stroke Brother 68   Pancreatic cancer Brother    Ovarian cancer Paternal Grandmother    Dementia Paternal Grandmother    Heart disease Paternal Grandfather    Kidney disease Paternal Grandfather    Hypertension Other    Hyperlipidemia Other    Lupus Daughter    Diabetes Daughter    Anxiety disorder Daughter    Depression Daughter    Obesity Daughter    Obesity Daughter    Esophageal cancer Neg Hx    Colon cancer Neg Hx    Stomach cancer Neg Hx    Atrial fibrillation Neg Hx     Social History:  Social History   Socioeconomic History   Marital status: Single    Spouse name: Not on file   Number of children: 2   Years of education: Not on file   Highest education  level: Not on file  Occupational History   Occupation: Kindergarten Runner, broadcasting/film/video    Comment: Programmer, multimedia  Tobacco Use   Smoking status: Never   Smokeless tobacco: Never  Vaping Use   Vaping status: Never Used  Substance and Sexual Activity   Alcohol use: No   Drug use: No   Sexual activity: Not Currently    Birth control/protection: None  Other Topics Concern   Not on file  Social History Narrative   Pt lives alone in 2 story home   Has 2 children   Masters degree   Works as an Programmer, systems    Social Drivers of Corporate investment banker Strain: Not on file  Food Insecurity: Low Risk  (05/28/2023)   Received from [facility]   Hunger Vital Sign    Within the past 12 months, you worried that your food would run out before you got money to buy more: Never true    Within the past 12 months, the food you bought just didn't last and you didn't have money to get more. : Never true  Transportation Needs: No Transportation Needs (05/28/2023)   Received from [facility]    In the past 12 months, has lack of reliable transportation kept you from medical appointments, meetings, work or from getting things needed for daily living? : No  Physical Activity: Not on file  Stress: Not on file  Social Connections: Not on file    Allergies:  Allergies  Allergen Reactions   Lisinopril Other (See Comments)    Cough    Metformin  Hcl Other (See Comments)   Trulicity  [Dulaglutide ] Other (See Comments)    Current Medications: Current Outpatient Medications  Medication Sig Dispense Refill   bismuth subsalicylate (PEPTO BISMOL) 262 MG chewable tablet Chew 524 mg by mouth as needed for indigestion or diarrhea or loose stools.     chlorthalidone  (HYGROTON ) 25 MG tablet Take 1 tablet by mouth daily.     Continuous Glucose Sensor (FREESTYLE LIBRE 2 SENSOR) MISC Change every 14 days 6 each 3   ELIQUIS  5 MG TABS tablet Take 1 tablet (5 mg total) by mouth 2 (two) times daily. 60  tablet 2   Insulin  NPH, Human,, Isophane, (NOVOLIN  N FLEXPEN) 100 UNIT/ML Kiwkpen Inject 22 Units into the skin in the morning and at bedtime. 45 mL 4   Insulin  NPH, Human,, Isophane, (NOVOLIN  N FLEXPEN) 100 UNIT/ML Kiwkpen Inject 16 Units into the skin.     Insulin  Pen Needle 32G X 4 MM MISC 1 Device by Does not apply route in the morning, at noon, in the evening,  and at bedtime. 400 each 3   olmesartan (BENICAR) 40 MG tablet Take 1 tablet by mouth daily.     Semaglutide ,0.25 or 0.5MG /DOS, 2 MG/3ML SOPN Inject 0.5 mg into the skin once a week. 9 mL 3   Vilazodone  HCl (VIIBRYD ) 10 MG TABS Take 1 tablet (10 mg total) by mouth daily. 30 tablet 2   Azilsartan-Chlorthalidone  40-25 MG TABS Take 1 tablet by mouth daily. (Patient not taking: Reported on 08/24/2023)     diltiazem  (CARDIZEM  CD) 120 MG 24 hr capsule Take 1 capsule by mouth daily. (Patient not taking: Reported on 08/24/2023)     No current facility-administered medications for this visit.     Musculoskeletal: Strength & Muscle Tone: within normal limits Gait & Station: normal Patient leans: N/A  Psychiatric Specialty Exam: Height 5' 2 (1.575 m), weight 284 lb (128.8 kg), last menstrual period 12/18/2014.Body mass index is 51.94 kg/m. Review of Systems  General Appearance: Well Groomed  Eye Contact:  Good  Speech:  Clear and Coherent  Volume:  Normal  Mood:  Euthymic and mild anxiety  Affect:  Appropriate  Thought Content: Logical   Suicidal Thoughts:  No  Homicidal Thoughts:  No  Thought Process:  Coherent and Goal Directed  Orientation:  Full (Time, Place, and Person)    Memory: Immediate;   Good  Judgment:  Good  Insight:  Fair  Concentration:  Concentration: Good  Recall:  not formally assessed   Fund of Knowledge: Fair  Language: Good  Psychomotor Activity:  Normal  Akathisia:  NA  AIMS (if indicated): not done  Assets:  Communication Skills Desire for Improvement Financial Resources/Insurance Housing Social  Support Talents/Skills Transportation Vocational/Educational  ADL's:  Intact  Cognition: WNL  Sleep:  Good   Metabolic Disorder Labs: Lab Results  Component Value Date   HGBA1C 12.9 (A) 05/04/2023   MPG 162.81 05/03/2021   MPG 243 08/11/2020   No results found for: PROLACTIN Lab Results  Component Value Date   CHOL 128 04/06/2022   TRIG 68 04/06/2022   HDL 53 04/06/2022   CHOLHDL 4.2 05/03/2021   VLDL 19 05/03/2021   LDLCALC 61 04/06/2022   LDLCALC 113 (H) 05/03/2021   Lab Results  Component Value Date   TSH 1.092 05/03/2021   TSH 0.87 06/15/2018    Therapeutic Level Labs: No results found for: LITHIUM No results found for: VALPROATE No results found for: CBMZ   Screenings: GAD-7    Flowsheet Row [facility]  Total GAD-7 Score 19   PHQ2-9    Flowsheet Row Office Visit from 08/24/2023 in [facility] Nutrition from 05/13/2017 in [facility]  - A Dept Of [place] Nutrition from 08/15/2015 in [facility]  - A Dept Of [place]  PHQ-2 Total Score 0 4 0   Flowsheet Row Office Visit from 08/24/2023 in [facility] ED from 06/20/2023 in [facility] Admission (Discharged) from 03/08/2023 in [facility] ENDOSCOPY  C-SSRS RISK CATEGORY No Risk No Risk No Risk    Collaboration of Care: Collaboration of Care: Medication Management AEB medication prescription and Other provider involved in patient's care AEB PCP and ED chart reviewed  Patient/Guardian was advised Release of Information must be obtained prior to any record release in order to collaborate their care with an outside provider. Patient/Guardian was advised  if they have not already done so to contact the registration department to sign all necessary  forms in order for us  to release information regarding their care.   Consent: Patient/Guardian gives verbal consent for treatment and assignment of benefits for services provided during this visit. Patient/Guardian expressed understanding and agreed to proceed.    Arvella CHRISTELLA Finder, MD 10/12/2023, 3:26 PM

## 2023-10-12 ENCOUNTER — Encounter (HOSPITAL_COMMUNITY): Payer: Self-pay | Admitting: Psychiatry

## 2023-10-12 ENCOUNTER — Other Ambulatory Visit: Payer: Self-pay

## 2023-10-12 ENCOUNTER — Ambulatory Visit (HOSPITAL_BASED_OUTPATIENT_CLINIC_OR_DEPARTMENT_OTHER): Admitting: Psychiatry

## 2023-10-12 VITALS — Ht 62.0 in | Wt 284.0 lb

## 2023-10-12 DIAGNOSIS — F4322 Adjustment disorder with anxiety: Secondary | ICD-10-CM

## 2023-10-12 DIAGNOSIS — F331 Major depressive disorder, recurrent, moderate: Secondary | ICD-10-CM

## 2023-10-12 MED ORDER — VILAZODONE HCL 10 MG PO TABS: 10.0000 mg | ORAL_TABLET | Freq: Every day | ORAL | 2 refills | Status: DC

## 2023-10-20 ENCOUNTER — Encounter: Payer: Self-pay | Admitting: Internal Medicine

## 2023-10-20 ENCOUNTER — Ambulatory Visit: Admitting: Internal Medicine

## 2023-10-20 VITALS — BP 134/80 | HR 90 | Wt 282.0 lb

## 2023-10-20 DIAGNOSIS — E1142 Type 2 diabetes mellitus with diabetic polyneuropathy: Secondary | ICD-10-CM | POA: Diagnosis not present

## 2023-10-20 DIAGNOSIS — E1159 Type 2 diabetes mellitus with other circulatory complications: Secondary | ICD-10-CM

## 2023-10-20 DIAGNOSIS — Z794 Long term (current) use of insulin: Secondary | ICD-10-CM | POA: Diagnosis not present

## 2023-10-20 DIAGNOSIS — E1165 Type 2 diabetes mellitus with hyperglycemia: Secondary | ICD-10-CM

## 2023-10-20 LAB — POCT GLYCOSYLATED HEMOGLOBIN (HGB A1C): Hemoglobin A1C: 11.8 % — AB (ref 4.0–5.6)

## 2023-10-20 MED ORDER — OMNIPOD 5 G7 PODS (GEN 5) MISC: 1.0000 | 3 refills | Status: AC

## 2023-10-20 NOTE — Patient Instructions (Signed)
 Continue  Ozempic  0.5 mg weekly Increase Novolin -N 30 units every morning and at bedtime  Continue Novolog  16 units with each meal Take Novolog  8 units with each snack  Novolog  correctional insulin : ADD extra units on insulin  to your meal-time Novolog  dose if your blood sugars are higher than 160. Use the scale below to help guide you:   Blood sugar before meal Number of units to inject  Less than 155 0 unit  156 - 180 1 units  181 - 205 2 units  206 - 230 3 units  231 - 255 4 units  256 - 280 5 units  281 - 305 6 units  306 - 330 7 units  331- 355 8 units  356 - 380 9 units   381 - 405 10 units     HOW TO TREAT LOW BLOOD SUGARS (Blood sugar LESS THAN 70 MG/DL) Please follow the RULE OF 15 for the treatment of hypoglycemia treatment (when your (blood sugars are less than 70 mg/dL)   STEP 1: Take 15 grams of carbohydrates when your blood sugar is low, which includes:  3-4 GLUCOSE TABS  OR 3-4 OZ OF JUICE OR REGULAR SODA OR ONE TUBE OF GLUCOSE GEL    STEP 2: RECHECK blood sugar in 15 MINUTES STEP 3: If your blood sugar is still low at the 15 minute recheck --> then, go back to STEP 1 and treat AGAIN with another 15 grams of carbohydrates.

## 2023-10-20 NOTE — Progress Notes (Signed)
 Name: Lindsay Gomez  Age/ Sex: 53 y.o., female   MRN/ DOB: [id number], 11-15-1970     PCP: Lindsay Madelin Patch, MD   Reason for Endocrinology Evaluation: Type 2 Diabetes Mellitus  Initial Endocrine Consultative Visit: 01/07/18    PATIENT IDENTIFIER: [name] is a 53 y.o. female with a past medical history of HTN, Obesity,CVA, OSA not on treatment and T2DM . The patient has followed with Endocrinology clinic since 01/07/18 for consultative assistance with management of her diabetes.  DIABETIC HISTORY:  [name] was diagnosed with T2DM in 2016,She is intolerant to Metformin .On her initial visit to our clinic she was on Basaglar , glipizide  and Trulicity , she did admit to noncompliance with her meds, she would take basaglar  once a month. Her hemoglobin A1c has ranged from  6.3% in 2014, peaking at 14.1% in 2018  Glimepiride  started 06/2022 with an A1c of 9.2%   Patient was started on basal insulin  by her PCP 11/2022 with an A1c of 11.8% I started her on NovoLog  12/2022 with an A1c of 13.0%  She endorsed GI side effects with Ozempic   She endorsed GI side effects with Tresiba  and Lantus    SUBJECTIVE:   During the last visit (05/05/2023): This was a virtual visit   Today (10/20/2023): Lindsay Gomez is here for a follow up on diabetes management. She has been checking glucose multiple times daily through freestyle libre .   Patient follows with behavioral health, she is on anti-anxiety medication She has been evaluated by PCP for headaches and HTN.  Brain MRI revealed remote infarct involving the right greater than the left basal ganglia and left thalamus on a background of mild chronic small vessel disease and mild parenchymal volume loss.   Has nausea but no vomiting that she attributes to  anxiety medications  No constipation , had 2 episode of loose stools that she attributes to food    HOME DIABETES REGIMEN:  Novolog  16 units TIDQAC  Novolin  -N 24 units  BID CF: Novolog  (BG-130/30) TIDQAC  Ozempic  0.5 mg weekly    CONTINUOUS GLUCOSE MONITORING RECORD INTERPRETATION    Dates of Recording: 7/20-10/16/2023  Sensor description:freestyle libre 3+  Results statistics:   CGM use % of time 17  Average and SD 256/19.8  Time in range   4     %  % Time Above 180 53  % Time above 250 43  % Time Below target 0   Glycemic patterns summary:   Hyperglycemic episodes    Hypoglycemic episodes occurred  Overnight periods:     DIABETIC COMPLICATIONS: Microvascular complications:  Neuropathy Denies:  Retinopathy, nephropathy Last eye exam: Completed 05/12/2023     Macrovascular complications:  CVA (02/2017) Denies: CAD, PVD       HISTORY:  Past Medical History:  Past Medical History:  Diagnosis Date   Allergy    rhinitis   Anemia    Anxiety    Atrial fibrillation (HCC)    CHF (congestive heart failure) (HCC)    Depression    Diabetes mellitus without complication (HCC)    glucose usually 140-150   Enlarged heart    Hyperlipidemia    Hypertension    Morbid obesity (HCC)    Shortness of breath dyspnea    with low iron   Sleep apnea    Stroke [facility])    Past Surgical History:  Past Surgical History:  Procedure Laterality Date   ABDOMINAL HYSTERECTOMY N/A 01/29/2015   Procedure: TOTAL ABDOMINAL HYSTERECTOMY ;  Surgeon: Rome Rigg, MD;  Location: [facility];  Service: Gynecology;  Laterality: N/A;   BILATERAL SALPINGECTOMY Bilateral 01/29/2015   Procedure: BILATERAL SALPINGECTOMY;  Surgeon: Rome Rigg, MD;  Location: [facility];  Service: Gynecology;  Laterality: Bilateral;   BIOPSY  03/08/2023   Procedure: BIOPSY;  Surgeon: Aneita Gwendlyn DASEN, MD;  Location: [facility];  Service: Gastroenterology;;   CESAREAN SECTION     COLONOSCOPY WITH PROPOFOL  N/A 03/08/2023   Procedure: COLONOSCOPY WITH PROPOFOL ;  Surgeon: Aneita Gwendlyn DASEN, MD;  Location: [facility];  Service: Gastroenterology;  Laterality: N/A;    ESOPHAGOGASTRODUODENOSCOPY (EGD) WITH PROPOFOL  N/A 03/08/2023   Procedure: ESOPHAGOGASTRODUODENOSCOPY (EGD) WITH PROPOFOL ;  Surgeon: Aneita Gwendlyn DASEN, MD;  Location: [facility];  Service: Gastroenterology;  Laterality: N/A;   KNEE ARTHROSCOPY Right 03/16/1990   POLYPECTOMY  03/08/2023   Procedure: POLYPECTOMY;  Surgeon: Aneita Gwendlyn DASEN, MD;  Location: [facility];  Service: Gastroenterology;;   SUBMUCOSAL TATTOO INJECTION  03/08/2023   Procedure: SUBMUCOSAL TATTOO INJECTION;  Surgeon: Aneita Gwendlyn DASEN, MD;  Location: [facility];  Service: Gastroenterology;;   Social History:  reports that she has never smoked. She has never used smokeless tobacco. She reports that she does not drink alcohol and does not use drugs. Family History:  Family History  Problem Relation Age of Onset   Hypertension Mother    Diabetes Mother    Coronary artery disease Father 11   Heart failure Father    Heart attack Father    Diabetes Father    Multiple myeloma Father    Stroke Brother 16   Pancreatic cancer Brother    Ovarian cancer Paternal Grandmother    Dementia Paternal Grandmother    Heart disease Paternal Grandfather    Kidney disease Paternal Grandfather    Hypertension Other    Hyperlipidemia Other    Lupus Daughter    Diabetes Daughter    Anxiety disorder Daughter    Depression Daughter    Obesity Daughter    Obesity Daughter    Esophageal cancer Neg Hx    Colon cancer Neg Hx    Stomach cancer Neg Hx    Atrial fibrillation Neg Hx      HOME MEDICATIONS: Allergies as of 10/20/2023       Reactions   Lisinopril Other (See Comments)   Cough    Metformin  Hcl Other (See Comments)   Trulicity  [dulaglutide ] Other (See Comments)        Medication List        Accurate as of October 20, 2023  3:11 PM. If you have any questions, ask your nurse or doctor.          Azilsartan-Chlorthalidone  40-25 MG Tabs Take 1 tablet by mouth daily.   bismuth subsalicylate 262 MG chewable  tablet Commonly known as: PEPTO BISMOL Chew 524 mg by mouth as needed for indigestion or diarrhea or loose stools.   chlorthalidone  25 MG tablet Commonly known as: HYGROTON  Take 1 tablet by mouth daily.   diltiazem  120 MG 24 hr capsule Commonly known as: CARDIZEM  CD Take 1 capsule by mouth daily.   Eliquis  5 MG Tabs tablet Generic drug: apixaban  Take 1 tablet (5 mg total) by mouth 2 (two) times daily.   FreeStyle Libre 2 Sensor Misc Change every 14 days   Insulin  Pen Needle 32G X 4 MM Misc 1 Device by Does not apply route in the morning, at noon, in the evening, and at bedtime.   NovoLIN  N FlexPen 100 UNIT/ML FlexPen Generic drug:  Insulin  NPH (Human) (Isophane) Inject 22 Units into the skin in the morning and at bedtime. What changed:  how much to take Another medication with the same name was removed. Continue taking this medication, and follow the directions you see here.   olmesartan 40 MG tablet Commonly known as: BENICAR Take 1 tablet by mouth daily.   Semaglutide (0.25 or 0.5MG /DOS) 2 MG/3ML Sopn Inject 0.5 mg into the skin once a week.   Vilazodone  HCl 10 MG Tabs Commonly known as: VIIBRYD  Take 1 tablet (10 mg total) by mouth daily.         OBJECTIVE:   Vital Signs: BP 134/80 (BP Location: Left Arm, Patient Position: Sitting, Cuff Size: Large)   Pulse 90   LMP 12/18/2014 (Exact Date)   SpO2 98%   Wt Readings from Last 3 Encounters:  03/08/23 262 lb (118.8 kg)  02/04/23 271 lb (122.9 kg)  12/30/22 268 lb (121.6 kg)     Exam: General: Pt appears well and is in NAD  Lungs: Clear with good BS bilat   Heart: RRR   Extremities: No  pretibial edema.    Neuro: MS is good with appropriate affect, pt is alert and Ox3   DM foot exam 10/20/2023 The skin of the feet is intact without sores or ulcerations. The pedal pulses are 2+ on right and 2+ on left. The sensation is decreased to a screening 5.07, 10 gram monofilament on the left     DATA REVIEWED:   Latest Reference Range & Units 10/19/22 16:17  Sodium 135 - 145 mEq/L 136  Potassium 3.5 - 5.1 mEq/L 3.6  Chloride 96 - 112 mEq/L 95 (L)  CO2 19 - 32 mEq/L 31  Glucose 70 - 99 mg/dL 626 (H)  BUN 6 - 23 mg/dL 11  Creatinine 9.59 - 8.79 mg/dL 9.18  Calcium  8.4 - 10.5 mg/dL 9.3  Alkaline Phosphatase 39 - 117 U/L 98  Albumin  3.5 - 5.2 g/dL 4.2  AST 0 - 37 U/L 28  ALT 0 - 35 U/L 25  Total Protein 6.0 - 8.3 g/dL 8.0  Total Bilirubin 0.2 - 1.2 mg/dL 0.4  GFR >39.99 mL/min 84.81    Old records , labs and images have been reviewed.    ASSESSMENT / PLAN / RECOMMENDATIONS:   1) Type 2 Diabetes Mellitus, Poorly Controlled , With neuropathic  and macrovascular complications - Most recent A1c of 11.8 %. Goal A1c < 7.0 %.     -Patient continues with hyperglycemia due to dietary indiscretions and medication nonadherence -Recently, she has been doing better with taking her medications on a regular basis -Intolerant to Mounjaro -She self discontinued glimepiride  -Patient attributes abdominal cramps and diarrhea to Tresiba  and Lantus  - Patient has been noted with persistent hyperglycemia, will increase NPH as below - I have recommended insulin  pump technology, referral to our CDE has been placed, a prescription for OmniPod 5 pods were sent to the pharmacy  MEDICATIONS: Increase Novolin -N 30 units twice daily Continue NovoLog  16 units 3 times daily before every meal Correction factor: NovoLog  (BG -130/25) TIDQAC   EDUCATION / INSTRUCTIONS: BG monitoring instructions: Patient is instructed to check her blood sugars 3 times a day, before meals  2) Diabetic complications:  Eye:She does not have known diabetic retinopathy. Pt urged to schedule an eye exam again Neuro/ Feet: Does have known diabetic peripheral neuropathy. Renal: Patient does not have known baseline CKD. She is on on an ACEI/ARB at present.    Follow-up in 3 months  Signed electronically by: Stefano Redgie Butts,  MD  [facility] Endocr[facility]ology  [facility] [address] [place], [place] [address] Phone: [phone] FAX: [phone]   CC: Lindsay Madelin Patch, MD [address] [place] [address] Phone: [phone]  Fax: [phone]  Return to Endocrinology clinic as below: Future Appointments  Date Time Provider Department Center  11/25/2023  3:30 PM Carvin Arvella HERO, MD [facility] None

## 2023-10-28 ENCOUNTER — Other Ambulatory Visit: Payer: Self-pay | Admitting: Internal Medicine

## 2023-10-28 MED ORDER — INSULIN ASPART 100 UNIT/ML IJ SOLN
INTRAMUSCULAR | 3 refills | Status: DC
Start: 2023-10-28 — End: 2024-02-08

## 2023-11-02 ENCOUNTER — Ambulatory Visit (HOSPITAL_COMMUNITY): Admitting: Psychiatry

## 2023-11-03 ENCOUNTER — Ambulatory Visit: Admitting: Nutrition

## 2023-11-08 ENCOUNTER — Encounter: Payer: Self-pay | Admitting: Nutrition

## 2023-11-08 ENCOUNTER — Telehealth: Payer: Self-pay | Admitting: Dietician

## 2023-11-08 ENCOUNTER — Telehealth: Payer: Self-pay | Admitting: Nutrition

## 2023-11-08 NOTE — Telephone Encounter (Signed)
 Patient canceled her pump training appointment with for the second time, saying she has gone back to work and is needing a late appointment.  I told her I would see if I can find her an appointment next week, by moving some patients and will let her know on Monday.  Tried calling patient to let her know that I can do the training tomorrow, but message went straight to voicemail, not able to leave a message

## 2023-11-08 NOTE — Telephone Encounter (Signed)
 Called patient re:  Omnipod training as patient is in my training portal. She states that she cannot come to her appointment 11/09/2023 due to a mandatory work meeting.  She states that she has arranged another time with Rock but this is not noted in the epic.   Messaged Linda.  Leita Constable, RD, LDN, CDCES, DipACLM

## 2023-11-09 ENCOUNTER — Encounter: Attending: Internal Medicine | Admitting: Nutrition

## 2023-11-09 ENCOUNTER — Ambulatory Visit: Admitting: Nutrition

## 2023-11-09 DIAGNOSIS — E1169 Type 2 diabetes mellitus with other specified complication: Secondary | ICD-10-CM | POA: Insufficient documentation

## 2023-11-09 DIAGNOSIS — E1165 Type 2 diabetes mellitus with hyperglycemia: Secondary | ICD-10-CM | POA: Insufficient documentation

## 2023-11-09 DIAGNOSIS — E785 Hyperlipidemia, unspecified: Secondary | ICD-10-CM | POA: Insufficient documentation

## 2023-11-09 NOTE — Patient Instructions (Signed)
 Change pod when empty Call OmniPod help line if questions about pump Change sensor every 10 days Call Dexcom if questions or problems with sensor. Give boluses before all meals-10u and snacks Do a correction dose whenever the bloodsugars is over 250.

## 2023-11-09 NOTE — Progress Notes (Signed)
 Patient was trained on the use of the OmniPod 5 insulin  pump.  She was not wearing her sensor.  She is currently wearing the libre 2 sensor.  I got an approval for the Dexcom G7 because the pods are for use with the G7 sensors  PA # [id number] from [employer].  She was trained on how to use the sensor.  The app on her phone was linked to [facility] and to the PDM.  The account was linked to OMNIPOD and to [employer]  OP ID: [email or web address]   PW: [email or web address]   [employer] PW: [email or web address]   PW: [email or web address] The PDM was set up per Dr. Kris orders.  Basal rate: 1.85u/hr, target: 120 with correctons over 140, I/C: 1, ISF: 25, timing: 4 hurs, max bolus: 30,  max basal: 3.6u. She was show how to bolus and do correction doses.  Her blood sugar was 386.  She did a correction dose at 4:10PM with little assitance from me.  Written instructions were given for 10u for each meal.  She says sometimes breakfast is just 1/2 of a greek yogurt.  She was told to take 2u for this.  She reported good understanding of this. We reviewed all topics on the checklist.  She signed this indicating good understanding and had no final questions.

## 2023-11-10 ENCOUNTER — Other Ambulatory Visit: Payer: Self-pay

## 2023-11-10 MED ORDER — DEXCOM G7 SENSOR MISC: 3 refills | Status: DC

## 2023-11-10 NOTE — Telephone Encounter (Signed)
 Her meeting canceled, and she was able to make the appointment.  Please decline her in your portal.  Thank you

## 2023-11-22 NOTE — Progress Notes (Unsigned)
 BH MD/PA/NP OP Progress Note  11/22/2023 2:30 PM Lindsay Gomez  MRN:  [id number]  Visit Diagnosis: No diagnosis found.  Assessment: Lindsay Gomez is a 53 y.o. female with a history of depression, anxiety, diabetes, hypertension, and stroke who presented to [facility] at [facility] for initial evaluation on 08/24/2023.    At initial evaluation patient reported history of anxiety and depression for which she does have a predisposition.  While she had endorsed symptoms consistent with major depressive disorder in the past as well as significant anxiety symptoms these have improved substantially during the 2 weeks prior to initial evaluation.  This was related to an improvement in work stressors which was the primary trigger for the increase in anxiety and depression.  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  Patient met criteria for MDD and adjustment disorder with anxious mood at initial evaluation.  Lindsay Gomez presents for follow-up evaluation. Today, 11/22/23, patient reports ***   that mood has been great in the interim though there has been some recent anxiety and depression secondary to the upcoming school year.  Given this patient was open to trialing medication and we reviewed the genesite testing.  We will start patient on Viibryd  10 mg daily and reviewed the risks and benefits.   Risk Assessment: An assessment of suicide and violence risk factors was performed as part of this evaluation and is not significantly changed from the last visit. While future psychiatric events cannot be accurately predicted, the patient does not currently require acute inpatient psychiatric care and does not currently meet [place]  involuntary commitment criteria. Patient was given contact information for crisis resources,  behavioral health clinic and was instructed to call 911 for emergencies.   Plan: # Moderate episode of recurrent major depressive disorder (HCC) Past medication trials: None Status of problem: Ongoing Interventions: - Start  Viibryd  10 mg daily - Continue individual therapy every 2 weeks.   # Adjustment disorder with anxious mood Past medication trials: None Status of problem: Ongoing Interventions: - Same  Risk Assessment: An assessment of suicide and violence risk factors was performed as part of this evaluation and is not significantly changed from the last visit. While future psychiatric events cannot be accurately predicted, the patient does not currently require acute inpatient psychiatric care and does not currently meet [place]  involuntary commitment criteria. Patient was given contact information for crisis resources, behavioral health clinic and was instructed to call 911 for emergencies.     Chief Complaint: No chief complaint on file.  HPI: ***   Past Psychiatric History:  Past psychiatric diagnoses: Anxiety and depression and grief over the years, never had medicine brought up Psychiatric hospitalizations:None, had one ER visit earlier 2024 Past suicide attempts: Denies Hx of self harm: Denies Hx of violence towards others: Denies Prior psychiatric providers: Denies other then meeting with Dr. Susen once [facility] Prior therapy: Ms. Verla Cobbs since Jan 2025 @ [facility] sees her every other week. Prior to that saw a therapist at [facility] for a year.  Access to firearms: denies  Prior medication trials: Prescribed Zoloft  and trazodone  but patient never started it.  Substance use: Denied any recent substance use. She did try a couple marijuana edibles in the past year, but nothing since November 2024.   Past Medical History:  Past Medical History:  Diagnosis Date   Allergy    rhinitis   Anemia    Anxiety     Atrial fibrillation (HCC)    CHF (congestive heart failure) (HCC)    Depression    Diabetes mellitus without complication (HCC)    glucose usually 140-150   Enlarged heart    Hyperlipidemia    Hypertension    Morbid obesity (HCC)    Shortness of breath dyspnea    with low iron   Sleep apnea    Stroke [facility])     Past Surgical History:  Procedure Laterality Date   ABDOMINAL HYSTERECTOMY N/A 01/29/2015   Procedure: TOTAL ABDOMINAL HYSTERECTOMY ;  Surgeon: Rome Rigg, MD;  Location: [facility];  Service: Gynecology;  Laterality: N/A;   BILATERAL SALPINGECTOMY Bilateral 01/29/2015   Procedure: BILATERAL SALPINGECTOMY;  Surgeon: Rome Rigg, MD;  Location: [facility];  Service: Gynecology;  Laterality: Bilateral;   BIOPSY  03/08/2023   Procedure: BIOPSY;  Surgeon: Aneita Gwendlyn DASEN, MD;  Location: [facility];  Service: Gastroenterology;;   CESAREAN SECTION     COLONOSCOPY WITH PROPOFOL  N/A 03/08/2023   Procedure: COLONOSCOPY WITH PROPOFOL ;  Surgeon: Aneita Gwendlyn DASEN, MD;  Location: [facility];  Service: Gastroenterology;  Laterality: N/A;   ESOPHAGOGASTRODUODENOSCOPY (EGD) WITH PROPOFOL  N/A 03/08/2023   Procedure: ESOPHAGOGASTRODUODENOSCOPY (EGD) WITH PROPOFOL ;  Surgeon: Aneita Gwendlyn DASEN, MD;  Location: [facility];  Service: Gastroenterology;  Laterality: N/A;   KNEE ARTHROSCOPY Right 03/16/1990   POLYPECTOMY  03/08/2023   Procedure: POLYPECTOMY;  Surgeon: Aneita Gwendlyn DASEN, MD;  Location: [facility];  Service: Gastroenterology;;   ROBLEY TATTOO INJECTION  03/08/2023   Procedure: SUBMUCOSAL TATTOO INJECTION;  Surgeon: Aneita Gwendlyn DASEN, MD;  Location: [facility];  Service: Gastroenterology;;   Family History:  Family History  Problem Relation Age of Onset   Hypertension Mother    Diabetes Mother    Coronary artery disease Father 46   Heart failure Father    Heart attack Father    Diabetes Father    Multiple myeloma Father    Stroke Brother 66   Pancreatic cancer  Brother    Ovarian cancer Paternal Grandmother    Dementia Paternal Grandmother    Heart disease Paternal Grandfather    Kidney disease Paternal Grandfather    Hypertension Other    Hyperlipidemia Other    Lupus Daughter    Diabetes Daughter    Anxiety disorder Daughter    Depression Daughter    Obesity Daughter    Obesity Daughter    Esophageal cancer Neg Hx    Colon cancer Neg Hx    Stomach cancer Neg Hx    Atrial fibrillation Neg Hx     Social History:  Social History   Socioeconomic History   Marital status: Single    Spouse name: Not on file   Number of children: 2   Years of education: Not on file   Highest education level: Not on file  Occupational History   Occupation: Kindergarten Runner, broadcasting/film/video    Comment: Programmer, multimedia  Tobacco Use   Smoking status: Never   Smokeless tobacco: Never  Vaping Use   Vaping status: Never Used  Substance and Sexual Activity   Alcohol use: No   Drug use: No   Sexual activity: Not Currently    Birth control/protection: None  Other Topics Concern   Not on file  Social History Narrative   Pt lives alone in 2 story home   Has 2 children  Masters degree   Works as an Administrator, Civil Service Strain: Not on [employer] Insecurity: Low Risk  (05/28/2023)   Received from [facility]   Hunger Vital Sign    Within the past 12 months, you worried that your food would run out before you got money to buy more: Never true    Within the past 12 months, the food you bought just didn't last and you didn't have money to get more. : Never true  Transportation Needs: No Transportation Needs (05/28/2023)   Received from [facility]    In the past 12 months, has lack of reliable transportation kept you from medical appointments, meetings, work or from getting things needed for daily living? : No  Physical Activity: Not on file  Stress: Not on file  Social Connections: Not on file     Allergies:  Allergies  Allergen Reactions   Lisinopril Other (See Comments)    Cough    Metformin  Hcl Other (See Comments)   Trulicity  [Dulaglutide ] Other (See Comments)    Current Medications: Current Outpatient Medications  Medication Sig Dispense Refill   Azilsartan-Chlorthalidone  40-25 MG TABS Take 1 tablet by mouth daily.     bismuth subsalicylate (PEPTO BISMOL) 262 MG chewable tablet Chew 524 mg by mouth as needed for indigestion or diarrhea or loose stools.     chlorthalidone  (HYGROTON ) 25 MG tablet Take 1 tablet by mouth daily.     Continuous Glucose Sensor (DEXCOM G7 SENSOR) MISC Change sensor every 10 day 9 each 3   Continuous Glucose Sensor (FREESTYLE LIBRE 2 SENSOR) MISC Change every 14 days 6 each 3   diltiazem  (CARDIZEM  CD) 120 MG 24 hr capsule Take 1 capsule by mouth daily. (Patient not taking: Reported on 08/24/2023)     ELIQUIS  5 MG TABS tablet Take 1 tablet (5 mg total) by mouth 2 (two) times daily. 60 tablet 2   insulin  aspart (NOVOLOG ) 100 UNIT/ML injection Max daily 100 units via pump 100 mL 3   Insulin  Disposable Pump (OMNIPOD 5 G7 PODS, GEN 5,) MISC 1 Device by Does not apply route every other day. 45 each 3   Insulin  NPH, Human,, Isophane, (NOVOLIN  N FLEXPEN) 100 UNIT/ML Kiwkpen Inject 22 Units into the skin in the morning and at bedtime. (Patient taking differently: Inject 24 Units into the skin in the morning and at bedtime.) 45 mL 4   Insulin  Pen Needle 32G X 4 MM MISC 1 Device by Does not apply route in the morning, at noon, in the evening, and at bedtime. 400 each 3   olmesartan (BENICAR) 40 MG tablet Take 1 tablet by mouth daily.     Semaglutide ,0.25 or 0.5MG /DOS, 2 MG/3ML SOPN Inject 0.5 mg into the skin once a week. 9 mL 3   Vilazodone  HCl (VIIBRYD ) 10 MG TABS Take 1 tablet (10 mg total) by mouth daily. 30 tablet 2   No current facility-administered medications for this visit.     Psychiatric Specialty Exam: Last menstrual period 12/18/2014.There  is no height or weight on file to calculate BMI. Review of Systems  General Appearance: {Appearance:22683}  Eye Contact:  {BHH EYE CONTACT:22684}  Speech:  {Speech:22685}  Volume:  {Volume (PAA):22686}  Mood:  {BHH MOOD:22306}  Affect:  {Affect (PAA):22687}  Thought Content: {Thought Content:22690}   Suicidal Thoughts:  {ST/HT (PAA):22692}  Homicidal Thoughts:  {ST/HT (PAA):22692}  Thought Process:  {Thought Process (PAA):22688}  Orientation:  {  BHH ORIENTATION (PAA):22689}    Memory: {BHH FZFNMB:77118}  Judgment:  {Judgement (PAA):22694}  Insight:  {Insight (PAA):22695}  Concentration:  {Concentration:21399}  Recall:  not formally assessed ***  Fund of Knowledge: {BHH GOOD/FAIR/POOR:22877}  Language: {BHH GOOD/FAIR/POOR:22877}  Psychomotor Activity:  {Psychomotor (PAA):22696}  Akathisia:  {BHH YES OR NO:22294}  AIMS (if indicated): {Desc; done/not:10129}  Assets:  {Assets (PAA):22698}  ADL's:  {BHH JIO'D:77709}  Cognition: {chl bhh cognition:304700322}  Sleep:  {BHH GOOD/FAIR/POOR:22877}   Metabolic Disorder Labs: Lab Results  Component Value Date   HGBA1C 11.8 (A) 10/20/2023   MPG 162.81 05/03/2021   MPG 243 08/11/2020   No results found for: PROLACTIN Lab Results  Component Value Date   CHOL 128 04/06/2022   TRIG 68 04/06/2022   HDL 53 04/06/2022   CHOLHDL 4.2 05/03/2021   VLDL 19 05/03/2021   LDLCALC 61 04/06/2022   LDLCALC 113 (H) 05/03/2021   Lab Results  Component Value Date   TSH 1.092 05/03/2021   TSH 0.87 06/15/2018    Therapeutic Level Labs: No results found for: LITHIUM No results found for: VALPROATE No results found for: CBMZ   Screenings: GAD-7    Flowsheet Row [facility]  Total GAD-7 Score 19   PHQ2-9    Flowsheet Row Office Visit from 08/24/2023 in [facility] Nutrition from 05/13/2017 in [facility]  - A  Dept Of [place]. [facility] Nutrition from 08/15/2015 in [facility]  - A Dept Of [place]  PHQ-2 Total Score 0 4 0   Flowsheet Row Office Visit from 08/24/2023 in [facility] ED from 06/20/2023 in [facility] Admission (Discharged) from 03/08/2023 in [facility]COPY  C-SSRS RISK CATEGORY No Risk No Risk No Risk    Collaboration of Care: Collaboration of Care: Medication Management AEB medication prescription and Other provider involved in patient's care AEB endocrinology, nutrition, and PCP chart review  Patient/Guardian was advised Release of Information must be obtained prior to any record release in order to collaborate their care with an outside provider. Patient/Guardian was advised if they have not already done so to contact the registration department to sign all necessary forms in order for us  to release information regarding their care.   Consent: Patient/Guardian gives verbal consent for treatment and assignment of benefits for services provided during this visit. Patient/Guardian expressed understanding and agreed to proceed.    Arvella CHRISTELLA Finder, MD 11/22/2023, 2:30 PM   Virtual Visit via Video Note  I connected with Lindsay Gomez on 11/22/23 at  3:30 PM EDT by a video enabled telemedicine application and verified that I am speaking with the correct person using two identifiers.  Location: Patient: Home Provider: Home Office   I discussed the limitations of evaluation and management by telemedicine and the availability of in person appointments. The patient expressed understanding and agreed to proceed.   I discussed the assessment and treatment plan with the patient. The patient was provided an opportunity to ask questions and all were answered. The patient agreed with the plan and demonstrated an understanding of the instructions.   The  patient was advised to call back or seek an in-person evaluation if the symptoms worsen or if the condition fails to improve as anticipated.  I provided *** minutes of non-face-to-face time during this encounter.   Arvella CHRISTELLA Finder, MD

## 2023-11-24 ENCOUNTER — Ambulatory Visit: Admitting: Skilled Nursing Facility1

## 2023-11-25 ENCOUNTER — Encounter (HOSPITAL_COMMUNITY): Payer: Self-pay | Admitting: Psychiatry

## 2023-11-25 ENCOUNTER — Telehealth (HOSPITAL_COMMUNITY): Admitting: Psychiatry

## 2023-11-25 DIAGNOSIS — F4329 Adjustment disorder with other symptoms: Secondary | ICD-10-CM

## 2023-11-25 DIAGNOSIS — F331 Major depressive disorder, recurrent, moderate: Secondary | ICD-10-CM | POA: Diagnosis not present

## 2023-11-25 MED ORDER — VILAZODONE HCL 10 MG PO TABS: 10.0000 mg | ORAL_TABLET | Freq: Every day | ORAL | 0 refills | Status: DC

## 2024-01-17 ENCOUNTER — Encounter: Payer: Self-pay | Admitting: Nutrition

## 2024-01-17 NOTE — Telephone Encounter (Signed)
 She is seeing me tomorrow.  Can you please refuse her in your portal and ask that she be assigned to me.  1 down, 7 to go!

## 2024-01-24 ENCOUNTER — Ambulatory Visit: Admitting: Internal Medicine

## 2024-01-24 DIAGNOSIS — E1165 Type 2 diabetes mellitus with hyperglycemia: Secondary | ICD-10-CM

## 2024-01-24 NOTE — Progress Notes (Deleted)
 Name: Lindsay Gomez  Age/ Sex: 53 y.o., female   MRN/ DOB: [id number], May 27, 1970     PCP: Jolee Madelin Patch, MD   Reason for Endocrinology Evaluation: Type 2 Diabetes Mellitus  Initial Endocrine Consultative Visit: 01/07/18    PATIENT IDENTIFIER: Lindsay Gomez is a 53 y.o. female with a past medical history of HTN, Obesity,CVA, OSA not on treatment and T2DM . The patient has followed with Endocrinology clinic since 01/07/18 for consultative assistance with management of her diabetes.  DIABETIC HISTORY:  Lindsay Gomez was diagnosed with T2DM in 2016,She is intolerant to Metformin .On her initial visit to our clinic she was on Basaglar , glipizide  and Trulicity , she did admit to noncompliance with her meds, she would take basaglar  once a month. Her hemoglobin A1c has ranged from  6.3% in 2014, peaking at 14.1% in 2018  Glimepiride  started 06/2022 with an A1c of 9.2%   Patient was started on basal insulin  by her PCP 11/2022 with an A1c of 11.8% I started her on NovoLog  12/2022 with an A1c of 13.0%  She endorsed GI side effects with Ozempic   She endorsed GI side effects with Tresiba  and Lantus    She was trained on the OmniPod in August, 2025   SUBJECTIVE:   During the last visit (10/20/2023): A1c 11.8   Today (01/24/2024): Lindsay Gomez is here for a follow up on diabetes management. She has been checking glucose multiple times daily through freestyle libre .   She was evaluated by her PCP for rectal bleed, on 01/03/2024  This patient with type *** diabetes is treated with *** (insulin  pump). During the visit the pump basal and bolus doses were reviewed including carb/insulin  rations and supplemental doses. The clinical list was updated. The glucose meter download was reviewed in detail to determine if the current pump settings are providing the best glycemic control without excessive hypoglycemia.  Pump and meter download:    Pump   OmniPod Settings   Insulin  type    NovoLog    Basal rate       0000 0.8 u/h               I:C ratio       0000 1:8           Sensitivity       0000  35       Goal       0000  110             Type & Model of Pump: Omnipod  Insulin  Type: Currently using ***.  There is no height or weight on file to calculate BMI.  PUMP STATISTICS: Average BG: ***  Average Daily Carbs (g): ***  Average Total Daily Insulin : ***  Average Daily Basal: *** (*** %) Average Daily Bolus: *** (*** %)     HOME DIABETES REGIMEN:  Novolog   Ozempic  0.5 mg weekly    CONTINUOUS GLUCOSE MONITORING RECORD INTERPRETATION    Dates of Recording: 7/20-10/16/2023  Sensor description:freestyle libre 3+  Results statistics:   CGM use % of time 17  Average and SD 256/19.8  Time in range   4     %  % Time Above 180 53  % Time above 250 43  % Time Below target 0   Glycemic patterns summary:   Hyperglycemic episodes    Hypoglycemic episodes occurred  Overnight periods:     DIABETIC COMPLICATIONS: Microvascular complications:  Neuropathy Denies:  Retinopathy, nephropathy Last eye exam:  Completed 05/12/2023     Macrovascular complications:  CVA (02/2017) Denies: CAD, PVD       HISTORY:  Past Medical History:  Past Medical History:  Diagnosis Date   Allergy    rhinitis   Anemia    Anxiety    Atrial fibrillation (HCC)    CHF (congestive heart failure) (HCC)    Depression    Diabetes mellitus without complication (HCC)    glucose usually 140-150   Enlarged heart    Hyperlipidemia    Hypertension    Morbid obesity (HCC)    Shortness of breath dyspnea    with low iron   Sleep apnea    Stroke [facility])    Past Surgical History:  Past Surgical History:  Procedure Laterality Date   ABDOMINAL HYSTERECTOMY N/A 01/29/2015   Procedure: TOTAL ABDOMINAL HYSTERECTOMY ;  Surgeon: Rome Rigg, MD;  Location: [facility];  Service: Gynecology;  Laterality: N/A;   BILATERAL SALPINGECTOMY Bilateral 01/29/2015    Procedure: BILATERAL SALPINGECTOMY;  Surgeon: Rome Rigg, MD;  Location: [facility];  Service: Gynecology;  Laterality: Bilateral;   BIOPSY  03/08/2023   Procedure: BIOPSY;  Surgeon: Aneita Gwendlyn DASEN, MD;  Location: [facility];  Service: Gastroenterology;;   CESAREAN SECTION     COLONOSCOPY WITH PROPOFOL  N/A 03/08/2023   Procedure: COLONOSCOPY WITH PROPOFOL ;  Surgeon: Aneita Gwendlyn DASEN, MD;  Location: [facility];  Service: Gastroenterology;  Laterality: N/A;   ESOPHAGOGASTRODUODENOSCOPY (EGD) WITH PROPOFOL  N/A 03/08/2023   Procedure: ESOPHAGOGASTRODUODENOSCOPY (EGD) WITH PROPOFOL ;  Surgeon: Aneita Gwendlyn DASEN, MD;  Location: [facility];  Service: Gastroenterology;  Laterality: N/A;   KNEE ARTHROSCOPY Right 03/16/1990   POLYPECTOMY  03/08/2023   Procedure: POLYPECTOMY;  Surgeon: Aneita Gwendlyn DASEN, MD;  Location: [facility];  Service: Gastroenterology;;   SUBMUCOSAL TATTOO INJECTION  03/08/2023   Procedure: SUBMUCOSAL TATTOO INJECTION;  Surgeon: Aneita Gwendlyn DASEN, MD;  Location: [facility];  Service: Gastroenterology;;   Social History:  reports that she has never smoked. She has never used smokeless tobacco. She reports that she does not drink alcohol and does not use drugs. Family History:  Family History  Problem Relation Age of Onset   Hypertension Mother    Diabetes Mother    Coronary artery disease Father 11   Heart failure Father    Heart attack Father    Diabetes Father    Multiple myeloma Father    Stroke Brother 70   Pancreatic cancer Brother    Ovarian cancer Paternal Grandmother    Dementia Paternal Grandmother    Heart disease Paternal Grandfather    Kidney disease Paternal Grandfather    Hypertension Other    Hyperlipidemia Other    Lupus Daughter    Diabetes Daughter    Anxiety disorder Daughter    Depression Daughter    Obesity Daughter    Obesity Daughter    Esophageal cancer Neg Hx    Colon cancer Neg Hx    Stomach cancer Neg Hx    Atrial fibrillation  Neg Hx      HOME MEDICATIONS: Allergies as of 01/24/2024       Reactions   Lisinopril Other (See Comments)   Cough    Metformin  Hcl Other (See Comments)   Trulicity  [dulaglutide ] Other (See Comments)        Medication List        Accurate as of January 24, 2024 10:19 AM. If you have any questions, ask your nurse or doctor.  Azilsartan-Chlorthalidone  40-25 MG Tabs Take 1 tablet by mouth daily.   bismuth subsalicylate 262 MG chewable tablet Commonly known as: PEPTO BISMOL Chew 524 mg by mouth as needed for indigestion or diarrhea or loose stools.   chlorthalidone  25 MG tablet Commonly known as: HYGROTON  Take 1 tablet by mouth daily.   diltiazem  120 MG 24 hr capsule Commonly known as: CARDIZEM  CD Take 1 capsule by mouth daily.   Eliquis  5 MG Tabs tablet Generic drug: apixaban  Take 1 tablet (5 mg total) by mouth 2 (two) times daily.   FreeStyle Libre 2 Sensor Misc Change every 14 days   Dexcom G7 Sensor Misc Change sensor every 10 day   insulin  aspart 100 UNIT/ML injection Commonly known as: novoLOG  Max daily 100 units via pump   Insulin  Pen Needle 32G X 4 MM Misc 1 Device by Does not apply route in the morning, at noon, in the evening, and at bedtime.   NovoLIN  N FlexPen 100 UNIT/ML FlexPen Generic drug: Insulin  NPH (Human) (Isophane) Inject 22 Units into the skin in the morning and at bedtime. What changed: how much to take   olmesartan 40 MG tablet Commonly known as: BENICAR Take 1 tablet by mouth daily.   Omnipod 5 G7 Pods (Gen 5) Misc 1 Device by Does not apply route every other day.   Semaglutide (0.25 or 0.5MG /DOS) 2 MG/3ML Sopn Inject 0.5 mg into the skin once a week.   Vilazodone  HCl 10 MG Tabs Commonly known as: VIIBRYD  Take 1 tablet (10 mg total) by mouth daily.         OBJECTIVE:   Vital Signs: LMP 12/18/2014 (Exact Date)   Wt Readings from Last 3 Encounters:  10/20/23 282 lb (127.9 kg)  03/08/23 262 lb (118.8 kg)   02/04/23 271 lb (122.9 kg)     Exam: General: Pt appears well and is in NAD  Lungs: Clear with good BS bilat   Heart: RRR   Extremities: No  pretibial edema.    Neuro: MS is good with appropriate affect, pt is alert and Ox3   DM foot exam 10/20/2023 The skin of the feet is intact without sores or ulcerations. The pedal pulses are 2+ on right and 2+ on left. The sensation is decreased to a screening 5.07, 10 gram monofilament on the left     DATA REVIEWED:  Labs through Care Everywhere 12/16/2023  Sodium 132 Potassium 3.5 Glucose 609 Creatinine 0.97 GFR 71    Old records , labs and images have been reviewed.    ASSESSMENT / PLAN / RECOMMENDATIONS:   1) Type 2 Diabetes Mellitus, Poorly Controlled , With neuropathic  and macrovascular complications - Most recent A1c of 11.8 %. Goal A1c < 7.0 %.     -Patient continues with hyperglycemia due to dietary indiscretions and medication nonadherence -Recently, she has been doing better with taking her medications on a regular basis -Intolerant to Mounjaro -She self discontinued glimepiride  -Patient attributes abdominal cramps and diarrhea to Tresiba  and Lantus  - Patient has been noted with persistent hyperglycemia, will increase NPH as below - I have recommended insulin  pump technology, referral to our CDE has been placed, a prescription for OmniPod 5 pods were sent to the pharmacy  MEDICATIONS: Increase Novolin -N 30 units twice daily Continue NovoLog  16 units 3 times daily before every meal Correction factor: NovoLog  (BG -130/25) TIDQAC   EDUCATION / INSTRUCTIONS: BG monitoring instructions: Patient is instructed to check her blood sugars 3 times a day, before meals  2)  Diabetic complications:  Eye:She does not have known diabetic retinopathy. Pt urged to schedule an eye exam again Neuro/ Feet: Does have known diabetic peripheral neuropathy. Renal: Patient does not have known baseline CKD. She is on on an ACEI/ARB at  present.    Follow-up in 3 months    Signed electronically by: Stefano Redgie Butts, MD  [facility] Endocrinology  [facility] [address] [place], [place] [address] Phone: [phone] FAX: [phone]   CC: Jolee Madelin Patch, MD [address] [place] [address] Phone: [phone]  Fax: [phone]  Return to Endocrinology clinic as below: Future Appointments  Date Time Provider Department Center  01/24/2024  2:40 PM Faolan Springfield, Donell Redgie, MD [id number] None  02/03/2024  3:30 PM Carvin Arvella HERO, MD [facility] None

## 2024-01-25 ENCOUNTER — Ambulatory Visit: Admitting: Internal Medicine

## 2024-01-28 ENCOUNTER — Encounter (HOSPITAL_BASED_OUTPATIENT_CLINIC_OR_DEPARTMENT_OTHER): Payer: Self-pay

## 2024-01-28 ENCOUNTER — Emergency Department (HOSPITAL_BASED_OUTPATIENT_CLINIC_OR_DEPARTMENT_OTHER)
Admission: EM | Admit: 2024-01-28 | Discharge: 2024-01-28 | Discharge status: Against Medical Advice | Attending: Emergency Medicine | Admitting: Emergency Medicine

## 2024-01-28 ENCOUNTER — Other Ambulatory Visit: Payer: Self-pay

## 2024-01-28 ENCOUNTER — Encounter

## 2024-01-28 DIAGNOSIS — Z5321 Procedure and treatment not carried out due to patient leaving prior to being seen by health care provider: Secondary | ICD-10-CM | POA: Insufficient documentation

## 2024-01-28 DIAGNOSIS — K625 Hemorrhage of anus and rectum: Secondary | ICD-10-CM | POA: Diagnosis present

## 2024-01-28 LAB — COMPREHENSIVE METABOLIC PANEL WITH GFR
ALT: 36 U/L (ref 0–44)
AST: 34 U/L (ref 15–41)
Albumin: 3.9 g/dL (ref 3.5–5.0)
Alkaline Phosphatase: 114 U/L (ref 38–126)
Anion gap: 11 (ref 5–15)
BUN: 10 mg/dL (ref 6–20)
CO2: 28 mmol/L (ref 22–32)
Calcium: 9.3 mg/dL (ref 8.9–10.3)
Chloride: 98 mmol/L (ref 98–111)
Creatinine, Ser: 0.9 mg/dL (ref 0.44–1.00)
GFR, Estimated: >60 mL/min (ref >60)
Glucose, Bld: 425 mg/dL — ABNORMAL HIGH (ref 70–99)
Potassium: 3.5 mmol/L (ref 3.5–5.1)
Sodium: 137 mmol/L (ref 135–145)
Total Bilirubin: 0.3 mg/dL (ref 0.0–1.2)
Total Protein: 7.2 g/dL (ref 6.5–8.1)

## 2024-01-28 LAB — CBC
HCT: 44 % (ref 36.0–46.0)
Hemoglobin: 14.1 g/dL (ref 12.0–15.0)
MCH: 27.6 pg (ref 26.0–34.0)
MCHC: 32 g/dL (ref 30.0–36.0)
MCV: 86.3 fL (ref 80.0–100.0)
Platelets: 260 K/uL (ref 150–400)
RBC: 5.1 MIL/uL (ref 3.87–5.11)
RDW: 14.4 % (ref 11.5–15.5)
WBC: 6.2 K/uL (ref 4.0–10.5)
nRBC: 0 % (ref 0.0–0.2)

## 2024-01-28 NOTE — ED Triage Notes (Signed)
 Patient arrives with rectal bleeding starting 2 days ago. She says it is intermittent, has had this in the past about a year ago. Patient says her PCP recommended she come here to rule out diverticular disease. She has no hx of this, she denies N/V/D and has no abdominal pain.

## 2024-01-31 NOTE — Progress Notes (Unsigned)
 BH MD/PA/NP OP Progress Note  02/03/2024 3:47 PM Lindsay Gomez  MRN:  [id number]  Visit Diagnosis:    ICD-10-CM   1. Moderate episode of recurrent major depressive disorder (HCC)  F33.1 Vilazodone  HCl (VIIBRYD ) 10 MG TABS       Assessment: CARLESHA SEIPLE is a 53 y.o. female with a history of depression, anxiety, diabetes, hypertension, and stroke who presented to [facility] at [facility] for initial evaluation on 08/24/2023.    At initial evaluation patient reported history of anxiety and depression for which she does have a predisposition.  While she had endorsed symptoms consistent with major depressive disorder in the past as well as significant anxiety symptoms these have improved substantially during the 2 weeks prior to initial evaluation.  This was related to an improvement in work stressors which was the primary trigger for the increase in anxiety and depression.  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  Patient met criteria for MDD and adjustment disorder with anxious mood at initial evaluation.  Lindsay Gomez presents for follow-up evaluation. Today, 02/03/24, patient mood has remained stable.  This is due to a combination of the medication and a decrease in stressors of school year.  She is taking Viibryd  consistently and denies any adverse side effects.  Patient also restarted the medications she had discontinued at last visit.  She does endorse feeling fatigued upon waking up, having a need to take a nap in the afternoon due to fatigue, snoring, and has an elevated BMI.  Given significant concern for sleep apnea which could be contributing to symptoms referral for sleep study was placed.  Patient will follow-up in 3 months.  Risk Assessment: An assessment of suicide and violence risk factors was  performed as part of this evaluation and is not significantly changed from the last visit. While future psychiatric events cannot be accurately predicted, the patient does not currently require acute inpatient psychiatric care and does not currently meet [place]  involuntary commitment criteria. Patient was given contact information for crisis resources, behavioral health clinic and was instructed to call 911 for emergencies.   Plan: # Moderate episode of recurrent major depressive disorder (HCC) Past medication trials: None Status of problem: Ongoing Interventions: - Continue Viibryd  10 mg daily - Continue individual therapy every 2 weeks. - Sleep study referral  # Adjustment disorder with anxious mood Past medication trials: None Status of problem: Ongoing Interventions: - Same  Risk Assessment: An assessment of suicide and violence risk factors was performed as part of this evaluation and is not significantly changed from the last visit. While future psychiatric events cannot be accurately predicted, the patient does not currently require acute inpatient psychiatric care and does not currently meet   involuntary commitment criteria. Patient was given contact information for crisis resources, behavioral health clinic and was instructed to call 911 for emergencies.   Chief Complaint:  Chief Complaint  Patient presents with   Follow-up   HPI: Patient presents report that the school year is going much better compared to last year. Life outside of school is spent resting in preparation to go back to school. She is constantly feeling tired. When she is feeling better she will go spend time with family. The need to rest has been going on for the last 3 years now patient reports that she will consistently take a  nap after school.  On evaluation of this fatigue patient endorses that she also does not feel rested after a night sleep and snores during the night.  Given BMI  there is concern for sleep apnea.  It looks like the referral was placed a year ago however patient was unable to attend the sleep study during school.  Will rerefer today and can consider home sleep study.  Patient is taking the Viibryd  consistently and feels it has been great.  Her moods have been much better controlled compared to the past.  She also did restart the medication she had previously discontinued.  Patient expressed some difficulties with Eliquis  and plans to discuss them with her cardiologist and PCP before discontinuing.  Past Psychiatric History:  Past psychiatric diagnoses: Anxiety and depression and grief over the years, never had medicine brought up Psychiatric hospitalizations:None, had one ER visit earlier 2024 Past suicide attempts: Denies Hx of self harm: Denies Hx of violence towards others: Denies Prior psychiatric providers: Denies other then meeting with Dr. Susen once [facility] Prior therapy: Ms. Verla Cobbs since Jan 2025 @ [facility] sees her every other week. Prior to that saw a therapist at [facility] for a year.  Access to firearms: denies  Prior medication trials: Prescribed Zoloft  and trazodone  but patient never started it.  Substance use: Denied any recent substance use. She did try a couple marijuana edibles in the past year, but nothing since November 2024.   Past Medical History:  Past Medical History:  Diagnosis Date   Allergy    rhinitis   Anemia    Anxiety    Atrial fibrillation (HCC)    CHF (congestive heart failure) (HCC)    Depression    Diabetes mellitus without complication (HCC)    glucose usually 140-150   Enlarged heart    Hyperlipidemia    Hypertension    Morbid obesity (HCC)    Shortness of breath dyspnea    with low iron   Sleep apnea    Stroke [facility])     Past Surgical History:  Procedure Laterality Date   ABDOMINAL HYSTERECTOMY N/A 01/29/2015   Procedure: TOTAL ABDOMINAL  HYSTERECTOMY ;  Surgeon: Rome Rigg, MD;  Location: [facility];  Service: Gynecology;  Laterality: N/A;   BILATERAL SALPINGECTOMY Bilateral 01/29/2015   Procedure: BILATERAL SALPINGECTOMY;  Surgeon: Rome Rigg, MD;  Location: [facility];  Service: Gynecology;  Laterality: Bilateral;   BIOPSY  03/08/2023   Procedure: BIOPSY;  Surgeon: Aneita Gwendlyn DASEN, MD;  Location: [facility];  Service: Gastroenterology;;   CESAREAN SECTION     COLONOSCOPY WITH PROPOFOL  N/A 03/08/2023   Procedure: COLONOSCOPY WITH PROPOFOL ;  Surgeon: Aneita Gwendlyn DASEN, MD;  Location: [facility];  Service: Gastroenterology;  Laterality: N/A;   ESOPHAGOGASTRODUODENOSCOPY (EGD) WITH PROPOFOL  N/A 03/08/2023   Procedure: ESOPHAGOGASTRODUODENOSCOPY (EGD) WITH PROPOFOL ;  Surgeon: Aneita Gwendlyn DASEN, MD;  Location: [facility];  Service: Gastroenterology;  Laterality: N/A;   KNEE ARTHROSCOPY Right 03/16/1990   POLYPECTOMY  03/08/2023   Procedure: POLYPECTOMY;  Surgeon: Aneita Gwendlyn DASEN, MD;  Location: [facility];  Service: Gastroenterology;;   SUBMUCOSAL TATTOO INJECTION  03/08/2023   Procedure: SUBMUCOSAL TATTOO INJECTION;  Surgeon: Aneita Gwendlyn DASEN, MD;  Location: [facility];  Service: Gastroenterology;;   Family History:  Family History  Problem Relation Age of Onset   Hypertension Mother    Diabetes Mother    Coronary artery disease Father 43   Heart failure Father  Heart attack Father    Diabetes Father    Multiple myeloma Father    Stroke Brother 44   Pancreatic cancer Brother    Ovarian cancer Paternal Grandmother    Dementia Paternal Grandmother    Heart disease Paternal Grandfather    Kidney disease Paternal Grandfather    Hypertension Other    Hyperlipidemia Other    Lupus Daughter    Diabetes Daughter    Anxiety disorder Daughter    Depression Daughter    Obesity Daughter    Obesity Daughter    Esophageal cancer Neg Hx    Colon cancer Neg Hx    Stomach cancer Neg Hx    Atrial fibrillation Neg  Hx     Social History:  Social History   Socioeconomic History   Marital status: Single    Spouse name: Not on file   Number of children: 2   Years of education: Not on file   Highest education level: Not on file  Occupational History   Occupation: Kindergarten Runner, Broadcasting/film/video    Comment: Programmer, Multimedia  Tobacco Use   Smoking status: Never   Smokeless tobacco: Never  Vaping Use   Vaping status: Never Used  Substance and Sexual Activity   Alcohol use: No   Drug use: No   Sexual activity: Not Currently    Birth control/protection: None  Other Topics Concern   Not on file  Social History Narrative   Pt lives alone in 2 story home   Has 2 children   Masters degree   Works as an programmer, systems    Social Drivers of Corporate Investment Banker Strain: Not on file  Food Insecurity: Low Risk  (01/25/2024)   Received from [facility]   Hunger Vital Sign    Within the past 12 months, you worried that your food would run out before you got money to buy more: Never true    Within the past 12 months, the food you bought just didn't last and you didn't have money to get more. : Never true  Transportation Needs: No Transportation Needs (01/25/2024)   Received from [facility]    In the past 12 months, has lack of reliable transportation kept you from medical appointments, meetings, work or from getting things needed for daily living? : No  Physical Activity: Not on file  Stress: Not on file  Social Connections: Not on file    Allergies:  Allergies  Allergen Reactions   Lisinopril Other (See Comments)    Cough    Metformin  Hcl Other (See Comments)   Trulicity  [Dulaglutide ] Other (See Comments)    Current Medications: Current Outpatient Medications  Medication Sig Dispense Refill   Azilsartan-Chlorthalidone  40-25 MG TABS Take 1 tablet by mouth daily.     bismuth subsalicylate (PEPTO BISMOL) 262 MG chewable tablet Chew 524 mg by mouth as needed for indigestion or  diarrhea or loose stools.     chlorthalidone  (HYGROTON ) 25 MG tablet Take 1 tablet by mouth daily.     Continuous Glucose Sensor (DEXCOM G7 SENSOR) MISC Change sensor every 10 day 9 each 3   Continuous Glucose Sensor (FREESTYLE LIBRE 2 SENSOR) MISC Change every 14 days 6 each 3   diltiazem  (CARDIZEM  CD) 120 MG 24 hr capsule Take 1 capsule by mouth daily. (Patient not taking: Reported on 08/24/2023)     ELIQUIS  5 MG TABS tablet Take 1 tablet (5 mg total) by mouth 2 (two) times daily. 60 tablet 2  insulin  aspart (NOVOLOG ) 100 UNIT/ML injection Max daily 100 units via pump 100 mL 3   Insulin  Disposable Pump (OMNIPOD 5 G7 PODS, GEN 5,) MISC 1 Device by Does not apply route every other day. 45 each 3   Insulin  Pen Needle 32G X 4 MM MISC 1 Device by Does not apply route in the morning, at noon, in the evening, and at bedtime. 400 each 3   olmesartan (BENICAR) 40 MG tablet Take 1 tablet by mouth daily.     Semaglutide ,0.25 or 0.5MG /DOS, 2 MG/3ML SOPN Inject 0.5 mg into the skin once a week. 9 mL 3   Vilazodone  HCl (VIIBRYD ) 10 MG TABS Take 1 tablet (10 mg total) by mouth daily. 90 tablet 0   No current facility-administered medications for this visit.     Psychiatric Specialty Exam: Last menstrual period 12/18/2014.There is no height or weight on file to calculate BMI. Review of Systems  General Appearance: NA and patient unable to get video functioning due to poor connection  Eye Contact:  NA  Speech:  Clear and Coherent and Normal Rate  Volume:  Normal  Mood:  Euthymic  Affect:  Congruent  Thought Content: Logical   Suicidal Thoughts:  No  Homicidal Thoughts:  No  Thought Process:  Coherent  Orientation:  Full (Time, Place, and Person)    Memory: Immediate;   Good  Judgment:  Fair  Insight:  Fair  Concentration:  Concentration: Good  Recall:  not formally assessed   Fund of Knowledge: Fair  Language: Good  Psychomotor Activity:  Normal  Akathisia:  NA  AIMS (if indicated): not done   Assets:  Communication Skills Desire for Improvement Financial Resources/Insurance Housing Transportation Vocational/Educational  ADL's:  Intact  Cognition: WNL  Sleep:  Good   Metabolic Disorder Labs: Lab Results  Component Value Date   HGBA1C 11.8 (A) 10/20/2023   MPG 162.81 05/03/2021   MPG 243 08/11/2020   No results found for: PROLACTIN Lab Results  Component Value Date   CHOL 128 04/06/2022   TRIG 68 04/06/2022   HDL 53 04/06/2022   CHOLHDL 4.2 05/03/2021   VLDL 19 05/03/2021   LDLCALC 61 04/06/2022   LDLCALC 113 (H) 05/03/2021   Lab Results  Component Value Date   TSH 1.092 05/03/2021   TSH 0.87 06/15/2018    Therapeutic Level Labs: No results found for: LITHIUM No results found for: VALPROATE No results found for: CBMZ   Screenings: GAD-7    Flowsheet Row [facility]  Total GAD-7 Score 19   PHQ2-9    Flowsheet Row Office Visit from 08/24/2023 in [facility] Nutrition from 05/13/2017 in [facility]  - A Dept Of [place]. [facility] Nutrition from 08/15/2015 in [facility]ab [facility]  - A Dept Of [place]. [facility]  PHQ-2 Total Score 0 4 0   Flowsheet Row ED from 01/28/2024 in [facility] Office Visit from 08/24/2023 in [facility] ED from [facility]/20/2023 in [facility]  C-SSRS RISK CATEGORY No Risk No Risk No Risk    Collaboration of Care: Collaboration of Care: Medication Management AEB medication prescription and Other provider involved in patient's care AEB ED, nutrition, and PCP chart review  Patient/Guardian was advised Release of Information must be obtained prior to any record release in order to collaborate their care with  an outside provider. Patient/Guardian was advised if they have not  already done so to contact the registration department to sign all necessary forms in order for us  to release information regarding their care.   Consent: Patient/Guardian gives verbal consent for treatment and assignment of benefits for services provided during this visit. Patient/Guardian expressed understanding and agreed to proceed.    Lindsay CHRISTELLA Finder, MD 02/03/2024, 3:47 PM   Virtual Visit via Video Note  I connected with Lindsay Gomez on 02/03/24 at  3:30 PM EST by a video enabled telemedicine application and verified that I am speaking with the correct person using two identifiers.  Location: Patient: Home Provider: Home Office   I discussed the limitations of evaluation and management by telemedicine and the availability of in person appointments. The patient expressed understanding and agreed to proceed.   I discussed the assessment and treatment plan with the patient. The patient was provided an opportunity to ask questions and all were answered. The patient agreed with the plan and demonstrated an understanding of the instructions.   The patient was advised to call back or seek an in-person evaluation if the symptoms worsen or if the condition fails to improve as anticipated.  I provided 15 minutes of non-face-to-face time during this encounter.   Lindsay CHRISTELLA Finder, MD

## 2024-02-03 ENCOUNTER — Other Ambulatory Visit (HOSPITAL_COMMUNITY): Payer: Self-pay

## 2024-02-03 ENCOUNTER — Encounter (HOSPITAL_COMMUNITY): Payer: Self-pay | Admitting: Psychiatry

## 2024-02-03 ENCOUNTER — Telehealth (HOSPITAL_BASED_OUTPATIENT_CLINIC_OR_DEPARTMENT_OTHER): Admitting: Psychiatry

## 2024-02-03 DIAGNOSIS — F331 Major depressive disorder, recurrent, moderate: Secondary | ICD-10-CM | POA: Diagnosis not present

## 2024-02-03 MED ORDER — VILAZODONE HCL 10 MG PO TABS: 10.0000 mg | ORAL_TABLET | Freq: Every day | ORAL | 0 refills | Status: DC

## 2024-02-07 ENCOUNTER — Ambulatory Visit (INDEPENDENT_AMBULATORY_CARE_PROVIDER_SITE_OTHER): Admitting: Internal Medicine

## 2024-02-07 ENCOUNTER — Encounter: Payer: Self-pay | Admitting: Internal Medicine

## 2024-02-07 ENCOUNTER — Ambulatory Visit: Admitting: Internal Medicine

## 2024-02-07 VITALS — BP 180/120 | Ht 62.0 in | Wt 279.0 lb

## 2024-02-07 DIAGNOSIS — E1165 Type 2 diabetes mellitus with hyperglycemia: Secondary | ICD-10-CM

## 2024-02-07 DIAGNOSIS — Z794 Long term (current) use of insulin: Secondary | ICD-10-CM

## 2024-02-07 LAB — POCT GLYCOSYLATED HEMOGLOBIN (HGB A1C): Hemoglobin A1C: 14.4 % — AB (ref 4.0–5.6)

## 2024-02-07 MED ORDER — DEXCOM G7 SENSOR MISC: 1.0000 | 3 refills | Status: DC

## 2024-02-07 NOTE — Patient Instructions (Addendum)
 Take Novolin -N 30 units every morning and at bedtime  Take  Novolog  16 units with each meal Take Novolog  8 units with each snack  Novolog  correctional insulin : ADD extra units on insulin  to your meal-time Novolog  dose if your blood sugars are higher than 160. Use the scale below to help guide you:   Blood sugar before meal Number of units to inject  Less than 155 0 unit  156 - 180 1 units  181 - 205 2 units  206 - 230 3 units  231 - 255 4 units  256 - 280 5 units  281 - 305 6 units  306 - 330 7 units  331- 355 8 units  356 - 380 9 units   381 - 405 10 units     HOW TO TREAT LOW BLOOD SUGARS (Blood sugar LESS THAN 70 MG/DL) Please follow the RULE OF 15 for the treatment of hypoglycemia treatment (when your (blood sugars are less than 70 mg/dL)   STEP 1: Take 15 grams of carbohydrates when your blood sugar is low, which includes:  3-4 GLUCOSE TABS  OR 3-4 OZ OF JUICE OR REGULAR SODA OR ONE TUBE OF GLUCOSE GEL    STEP 2: RECHECK blood sugar in 15 MINUTES STEP 3: If your blood sugar is still low at the 15 minute recheck --> then, go back to STEP 1 and treat AGAIN with another 15 grams of carbohydrates.

## 2024-02-07 NOTE — Progress Notes (Unsigned)
 Name: Lindsay Gomez  Age/ Sex: 53 y.o., female   MRN/ DOB: [id number], 29-Jan-1971     PCP: Jolee Madelin Patch, MD   Reason for Endocrinology Evaluation: Type 2 Diabetes Mellitus  Initial Endocrine Consultative Visit: 01/07/18    PATIENT IDENTIFIER: Lindsay Gomez is a 53 y.o. female with a past medical history of HTN, Obesity,CVA, OSA not on treatment and T2DM . The patient has followed with Endocrinology clinic since 01/07/18 for consultative assistance with management of her diabetes.  DIABETIC HISTORY:  Ms. Hensler was diagnosed with T2DM in 2016,She is intolerant to Metformin .On her initial visit to our clinic she was on Basaglar , glipizide  and Trulicity , she did admit to noncompliance with her meds, she would take basaglar  once a month. Her hemoglobin A1c has ranged from  6.3% in 2014, peaking at 14.1% in 2018  Glimepiride  started 06/2022 with an A1c of 9.2%   Patient was started on basal insulin  by her PCP 11/2022 with an A1c of 11.8% I started her on NovoLog  12/2022 with an A1c of 13.0%  She endorsed GI side effects with Ozempic   She endorsed GI side effects with Tresiba  and Lantus    She was trained on the OmniPod in August, 2025.  But by her visit to our clinic in November, 2025 she was not on the OmniPod nor the Dexcom, stating that she developed a rash with the OmniPod   HTN: Aldo, renin, Aldo/renin ratio normal 01/2023    SUBJECTIVE:   During the last visit (10/20/2023): A1c 11.8   Today (02/07/2024): Lindsay Gomez is here for a follow up on diabetes management.  She stopped using the Dexcom and the OmniPod.  The patient states she developed a skin rash with the OmniPod, unclear reason why she did not continue with the Dexcom as she did not have any reaction to the sensor.  Patient states she has been using NovoLog  and NPH injections, the patient has maxed her doses and has been using a standing dose of NovoLog  26 units twice daily, and using the NPH per  correction scale????  She was evaluated by her PCP for rectal bleed, on 01/03/2024 that was attributed to Eliquis   She presented to the ED last night with HTN and hyperglycemia No nausea or vomiting  No recent constipation         HOME DIABETES REGIMEN:  Novolin -N - based on sliding scale ?  Novolog  26 units BID ?    CONTINUOUS GLUCOSE MONITORING RECORD INTERPRETATION: N/A     DIABETIC COMPLICATIONS: Microvascular complications:  Neuropathy Denies:  Retinopathy, nephropathy Last eye exam: Completed 05/12/2023     Macrovascular complications:  CVA (02/2017) Denies: CAD, PVD       HISTORY:  Past Medical History:  Past Medical History:  Diagnosis Date   Allergy    rhinitis   Anemia    Anxiety    Atrial fibrillation (HCC)    CHF (congestive heart failure) (HCC)    Depression    Diabetes mellitus without complication (HCC)    glucose usually 140-150   Enlarged heart    Hyperlipidemia    Hypertension    Morbid obesity (HCC)    Shortness of breath dyspnea    with low iron   Sleep apnea    Stroke [facility])    Past Surgical History:  Past Surgical History:  Procedure Laterality Date   ABDOMINAL HYSTERECTOMY N/A 01/29/2015   Procedure: TOTAL ABDOMINAL HYSTERECTOMY ;  Surgeon: Rome Rigg, MD;  Location: [facility];  Service: Gynecology;  Laterality: N/A;   BILATERAL SALPINGECTOMY Bilateral 01/29/2015   Procedure: BILATERAL SALPINGECTOMY;  Surgeon: Rome Rigg, MD;  Location: [facility];  Service: Gynecology;  Laterality: Bilateral;   BIOPSY  03/08/2023   Procedure: BIOPSY;  Surgeon: Aneita Gwendlyn DASEN, MD;  Location: [facility];  Service: Gastroenterology;;   CESAREAN SECTION     COLONOSCOPY WITH PROPOFOL  N/A 03/08/2023   Procedure: COLONOSCOPY WITH PROPOFOL ;  Surgeon: Aneita Gwendlyn DASEN, MD;  Location: [facility];  Service: Gastroenterology;  Laterality: N/A;   ESOPHAGOGASTRODUODENOSCOPY (EGD) WITH PROPOFOL  N/A 03/08/2023   Procedure:  ESOPHAGOGASTRODUODENOSCOPY (EGD) WITH PROPOFOL ;  Surgeon: Aneita Gwendlyn DASEN, MD;  [facility];  Service: Gastroenterology;  Laterality: N/A;   KNEE ARTHROSCOPY Right 03/16/1990   POLYPECTOMY  03/08/2023   Procedure: POLYPECTOMY;  Surgeon: Aneita Gwendlyn DASEN, MD;  Location: [facility];  Service: Gastroenterology;;   SUBMUCOSAL TATTOO INJECTION  03/08/2023   Procedure: SUBMUCOSAL TATTOO INJECTION;  Surgeon: Aneita Gwendlyn DASEN, MD;  [facility];  Service: Gastroenterology;;   Social History:  reports that she has never smoked. She has never used smokeless tobacco. She reports that she does not drink alcohol and does not use drugs. Family History:  Family History  Problem Relation Age of Onset   Hypertension Mother    Diabetes Mother    Coronary artery disease Father 34   Heart failure Father    Heart attack Father    Diabetes Father    Multiple myeloma Father    Stroke Brother 46   Pancreatic cancer Brother    Ovarian cancer Paternal Grandmother    Dementia Paternal Grandmother    Heart disease Paternal Grandfather    Kidney disease Paternal Grandfather    Hypertension Other    Hyperlipidemia Other    Lupus Daughter    Diabetes Daughter    Anxiety disorder Daughter    Depression Daughter    Obesity Daughter    Obesity Daughter    Esophageal cancer Neg Hx    Colon cancer Neg Hx    Stomach cancer Neg Hx    Atrial fibrillation Neg Hx      HOME MEDICATIONS: Allergies as of 02/07/2024       Reactions   Lisinopril Other (See Comments)   Cough    Metformin  Hcl Other (See Comments)   Trulicity  [dulaglutide ] Other (See Comments)        Medication List        Accurate as of February 07, 2024  2:46 PM. If you have any questions, ask your nurse or doctor.          STOP taking these medications    Dexcom G7 Sensor Misc Stopped by: Lindsay Gomez   FreeStyle Libre 2 Sensor Misc Stopped by: Lindsay Gomez   Semaglutide (0.25 or  0.5MG /DOS) 2 MG/3ML Sopn Stopped by: Lindsay Gomez       TAKE these medications    Azilsartan-Chlorthalidone  40-25 MG Tabs Take 1 tablet by mouth daily.   bismuth subsalicylate 262 MG chewable tablet Commonly known as: PEPTO BISMOL Chew 524 mg by mouth as needed for indigestion or diarrhea or loose stools.   chlorthalidone  25 MG tablet Commonly known as: HYGROTON  Take 1 tablet by mouth daily.   diltiazem  120 MG 24 hr capsule Commonly known as: CARDIZEM  CD Take 1 capsule by mouth daily.   Eliquis  5 MG Tabs tablet Generic drug: apixaban  Take 1 tablet (5 mg total) by mouth 2 (two) times daily.   insulin  aspart 100  UNIT/ML injection Commonly known as: novoLOG  Max daily 100 units via pump   Insulin  Pen Needle 32G X 4 MM Misc 1 Device by Does not apply route in the morning, at noon, in the evening, and at bedtime.   olmesartan 40 MG tablet Commonly known as: BENICAR Take 1 tablet by mouth daily.   Omnipod 5 G7 Pods (Gen 5) Misc 1 Device by Does not apply route every other day.   Vilazodone  HCl 10 MG Tabs Commonly known as: VIIBRYD  Take 1 tablet (10 mg total) by mouth daily.         OBJECTIVE:   Vital Signs:BP (!) 180/120   Ht 5' 2 (1.575 m)   Wt 279 lb (126.6 kg)   LMP 12/18/2014 (Exact Date)   BMI 51.03 kg/m   Wt Readings from Last 3 Encounters:  10/20/23 282 lb (127.9 kg)  03/08/23 262 lb (118.8 kg)  02/04/23 271 lb (122.9 kg)     Exam: General: Lindsay Gomez appears well and is in NAD  Lungs: Clear with good BS bilat   Heart: RRR   Extremities: No  pretibial edema.    Neuro: MS is good with appropriate affect, Lindsay Gomez   DM foot exam 10/20/2023 The skin of the feet is intact without sores or ulcerations. The pedal pulses are 2+ on right and 2+ on left. The sensation is decreased to a screening 5.07, 10 gram monofilament on the left     DATA REVIEWED:  Labs through Care Everywhere 12/16/2023  Sodium 132 Potassium 3.5 Glucose  609 Creatinine 0.97 GFR 71    Old records , labs and images have been reviewed.    ASSESSMENT / PLAN / RECOMMENDATIONS:   1) Type 2 Diabetes Mellitus, Poorly Controlled , With neuropathic  and macrovascular complications - Most recent A1c of 14.4 %. Goal A1c < 7.0 %.     -Patient continues with worsening glycemic control, I am unclear of her barriers or the reasoning for lack of self-care.  But despite of printing out her AVS and discussing the changes during the visit, the patient somehow has been taking NovoLog  26 units twice daily regardless of the meals, and has been taking NPH based on the correction scale?Lindsay Gomez  The patient is unable to tell me the names of her insulin  but would rather give me the color of the insulin  pen. - I did encourage the patient to go home, compare the names of her insulin  pens to the color and verifying the doses - I did explain to the patient that NPH is usually a standing dose and the NovoLog  is the 1 that she used based on a correction scale - She also has been using less insulin  than previously prescribed - I will reprint her last AVS with the updated insulin  doses - She has an appointment set up with our CDE to we discussed the OmniPod/Dexcom - She was provided with #1 Dexcom sensor.  The patient was advised that she may use a Dexcom for glucose checks since she is not checking glucose at home, and that she does not need to use the OmniPod, as long as she is injecting her insulin   -Intolerant to Mounjaro and Ozempic  -She self discontinued glimepiride  -Patient attributes abdominal cramps and diarrhea to Tresiba  and Lantus   MEDICATIONS: Take Novolin -N 30 units twice daily Take NovoLog  16 units 3 times daily before every meal Take NovoLog  8 units with a snack Correction factor: NovoLog  (BG -130/25) TIDQAC   EDUCATION /  INSTRUCTIONS: BG monitoring instructions: Patient is instructed to check her blood sugars 3 times a day, before meals  2) Diabetic  complications:  Eye:She does not have known diabetic retinopathy. Lindsay Gomez urged to schedule an eye exam again Neuro/ Feet: Does have known diabetic peripheral neuropathy. Renal: Patient does not have known baseline CKD. She is on on an ACEI/ARB at present.    Follow-up in 3 months  I spent 25 minutes preparing to see the patient by review of recent labs, imaging and procedures, obtaining and reviewing separately obtained history, communicating with the patient/family or caregiver, ordering medications, tests or procedures, and documenting clinical information in the EHR including the differential Dx, treatment, and any further evaluation and other management    Signed electronically by: Stefano Redgie Butts, MD  [facility] Endocrinology  [facility] [address] [place] [place], [place] [address] Phone: [phone] FAX: [phone]   CC: Jolee Madelin Patch, MD [address] [place] [address] Phone: [phone]  Fax: [phone]  Return to Endocrinology clinic as below: Future Appointments  Date Time Provider Department Center  02/07/2024  3:00 PM Derinda Bartus, Lindsay Redgie, MD [facility] None  05/04/2024  3:30 PM Carvin Arvella HERO, MD [facility] None

## 2024-02-08 ENCOUNTER — Telehealth: Payer: Self-pay | Admitting: Nutrition

## 2024-02-08 MED ORDER — INSULIN PEN NEEDLE 32G X 4 MM MISC: 1.0000 | Freq: Four times a day (QID) | 3 refills | Status: AC

## 2024-02-08 MED ORDER — SKIN TAC ADHESIVE BARRIER WIPE MISC: 1.0000 | Freq: Every day | 1 refills | Status: AC

## 2024-02-08 MED ORDER — INSULIN ASPART 100 UNIT/ML IJ SOLN: INTRAMUSCULAR | 3 refills | Status: AC

## 2024-02-08 MED ORDER — DEXCOM G7 SENSOR MISC: 1.0000 | 3 refills | Status: AC

## 2024-02-08 MED ORDER — NOVOLIN N FLEXPEN 100 UNIT/ML ~~LOC~~ SUPN: 30.0000 [IU] | PEN_INJECTOR | Freq: Two times a day (BID) | SUBCUTANEOUS | 3 refills | Status: AC

## 2024-02-08 NOTE — Telephone Encounter (Signed)
 Patient reports that she stopped using the pump due to skin reaction.  Suggested she try a skin barrier.  I do not have a sample to give her to try.  Note to Dr. Sam to order Skin tac wipes or liquid for her, and suggested that we can retry the pump when she gets them.

## 2024-02-08 NOTE — Addendum Note (Signed)
 Addended by: SAM DONELL PARAS on: 02/08/2024 01:19 PM   Modules accepted: Orders

## 2024-02-23 ENCOUNTER — Encounter: Payer: Self-pay | Admitting: Internal Medicine

## 2024-02-23 ENCOUNTER — Ambulatory Visit (INDEPENDENT_AMBULATORY_CARE_PROVIDER_SITE_OTHER): Admitting: Internal Medicine

## 2024-02-23 VITALS — BP 156/90 | Ht 62.0 in | Wt 284.0 lb

## 2024-02-23 DIAGNOSIS — M25472 Effusion, left ankle: Secondary | ICD-10-CM | POA: Insufficient documentation

## 2024-02-23 DIAGNOSIS — E1142 Type 2 diabetes mellitus with diabetic polyneuropathy: Secondary | ICD-10-CM

## 2024-02-23 DIAGNOSIS — E1159 Type 2 diabetes mellitus with other circulatory complications: Secondary | ICD-10-CM

## 2024-02-23 DIAGNOSIS — I10 Essential (primary) hypertension: Secondary | ICD-10-CM | POA: Diagnosis not present

## 2024-02-23 DIAGNOSIS — E1165 Type 2 diabetes mellitus with hyperglycemia: Secondary | ICD-10-CM

## 2024-02-23 DIAGNOSIS — Z794 Long term (current) use of insulin: Secondary | ICD-10-CM

## 2024-02-23 NOTE — Patient Instructions (Addendum)
 Take Novolin -N 30 units every morning and at bedtime  Take  Novolog  18 units with each meal Take Novolog  8 units with each snack  Novolog  correctional insulin : ADD extra units on insulin  to your meal-time Novolog  dose if your blood sugars are higher than 160. Use the scale below to help guide you:   Blood sugar before meal Number of units to inject  Less than 155 0 unit  156 - 180 1 units  181 - 205 2 units  206 - 230 3 units  231 - 255 4 units  256 - 280 5 units  281 - 305 6 units  306 - 330 7 units  331- 355 8 units  356 - 380 9 units   381 - 405 10 units     HOW TO TREAT LOW BLOOD SUGARS (Blood sugar LESS THAN 70 MG/DL) Please follow the RULE OF 15 for the treatment of hypoglycemia treatment (when your (blood sugars are less than 70 mg/dL)   STEP 1: Take 15 grams of carbohydrates when your blood sugar is low, which includes:  3-4 GLUCOSE TABS  OR 3-4 OZ OF JUICE OR REGULAR SODA OR ONE TUBE OF GLUCOSE GEL    STEP 2: RECHECK blood sugar in 15 MINUTES STEP 3: If your blood sugar is still low at the 15 minute recheck --> then, go back to STEP 1 and treat AGAIN with another 15 grams of carbohydrates.

## 2024-02-23 NOTE — Progress Notes (Signed)
 Name: Lindsay Gomez  Age/ Sex: 53 y.o., female   MRN/ DOB: [id number], 18-Aug-1970     PCP: Jolee Madelin Patch, MD   Reason for Endocrinology Evaluation: Type 2 Diabetes Mellitus  Initial Endocrine Consultative Visit: 01/07/18    PATIENT IDENTIFIER: Lindsay Gomez is a 53 y.o. female with a past medical history of HTN, Obesity,CVA, OSA not on treatment and T2DM . The patient has followed with Endocrinology clinic since 01/07/18 for consultative assistance with management of her diabetes.  DIABETIC HISTORY:  Lindsay Gomez was diagnosed with T2DM in 2016,She is intolerant to Metformin .On her initial visit to our clinic she was on Basaglar , glipizide  and Trulicity , she did admit to noncompliance with her meds, she would take basaglar  once a month. Her hemoglobin A1c has ranged from  6.3% in 2014, peaking at 14.1% in 2018  Glimepiride  started 06/2022 with an A1c of 9.2%   Patient was started on basal insulin  by her PCP 11/2022 with an A1c of 11.8% I started her on NovoLog  12/2022 with an A1c of 13.0%  She endorsed GI side effects with Ozempic   She endorsed GI side effects with Tresiba  and Lantus    She was trained on the OmniPod in August, 2025.  But by her visit to our clinic in November, 2025 she was not on the OmniPod nor the Dexcom, stating that she developed a rash with the OmniPod   HTN: Aldo, renin, Aldo/renin ratio normal 01/2023    SUBJECTIVE:   During the last visit (10/20/2023): A1c 11.8   Today (02/23/2024): Lindsay Gomez is here for a follow up on diabetes management.     She has felt unwell an hour ago, she feels jittery  No nausea  She did have an episode of constipation 2 days ago, which has resolved  No headaches  Has noted LE edema  Has left ankle pain , no injury   She sees psychiatry, she also has a therapist      HOME DIABETES REGIMEN:  Novolin -N  30 units at bedtime Novolog  16 units with each meal NovoLog  8 units with a snack CF: NovoLog   (BG -130/25)   CONTINUOUS GLUCOSE MONITORING RECORD INTERPRETATION    Dates of Recording: 11/27-12/12/2023  Sensor description:dexcom  Results statistics:   CGM use % of time 78  Average and SD 276/89  Time in range    18    %  % Time Above 180 26  % Time above 250 56  % Time Below target 0   Glycemic patterns summary: Patient has been noted with hyperglycemia throughout the day and night  Hyperglycemic episodes all day and night  Hypoglycemic episodes occurred N/A  Overnight periods: High     DIABETIC COMPLICATIONS: Microvascular complications:  Neuropathy Denies:  Retinopathy, nephropathy Last eye exam: Completed 05/12/2023     Macrovascular complications:  CVA (02/2017) Denies: CAD, PVD       HISTORY:  Past Medical History:  Past Medical History:  Diagnosis Date   Allergy    rhinitis   Anemia    Anxiety    Atrial fibrillation (HCC)    CHF (congestive heart failure) (HCC)    Depression    Diabetes mellitus without complication (HCC)    glucose usually 140-150   Enlarged heart    Hyperlipidemia    Hypertension    Morbid obesity (HCC)    Shortness of breath dyspnea    with low iron   Sleep apnea    Stroke (HCC)  Past Surgical History:  Past Surgical History:  Procedure Laterality Date   ABDOMINAL HYSTERECTOMY N/A 01/29/2015   Procedure: TOTAL ABDOMINAL HYSTERECTOMY ;  Surgeon: Rome Rigg, MD;  Location: [facility];  Service: Gynecology;  Laterality: N/A;   BILATERAL SALPINGECTOMY Bilateral 01/29/2015   Procedure: BILATERAL SALPINGECTOMY;  Surgeon: Rome Rigg, MD;  Location: [facility];  Service: Gynecology;  Laterality: Bilateral;   BIOPSY  03/08/2023   Procedure: BIOPSY;  Surgeon: Aneita Gwendlyn DASEN, MD;  Location: [facility];  Service: Gastroenterology;;   CESAREAN SECTION     COLONOSCOPY WITH PROPOFOL  N/A 03/08/2023   Procedure: COLONOSCOPY WITH PROPOFOL ;  Surgeon: Aneita Gwendlyn DASEN, MD;  [facility];  Service:  Gastroenterology;  Laterality: N/A;   ESOPHAGOGASTRODUODENOSCOPY (EGD) WITH PROPOFOL  N/A 03/08/2023   Procedure: ESOPHAGOGASTRODUODENOSCOPY (EGD) WITH PROPOFOL ;  Surgeon: Aneita Gwendlyn DASEN, MD;  [facility]: [facility];  Service: Gastroenterology;  Laterality: N/A;   KNEE ARTHROSCOPY Right 03/16/1990   POLYPECTOMY  03/08/2023   Procedure: POLYPECTOMY;  Surgeon: Aneita Gwendlyn DASEN, MD;  Location: [facility];  Service: Gastroenterology;;   SUBMUCOSAL TATTOO INJECTION  03/08/2023   Procedure: SUBMUCOSAL TATTOO INJECTION;  Surgeon: Aneita Gwendlyn DASEN, MD;  [facility];  Service: Gastroenterology;;   Social History:  reports that she has never smoked. She has never used smokeless tobacco. She reports that she does not drink alcohol and does not use drugs. Family History:  Family History  Problem Relation Age of Onset   Hypertension Mother    Diabetes Mother    Coronary artery disease Father 71   Heart failure Father    Heart attack Father    Diabetes Father    Multiple myeloma Father    Stroke Brother 39   Pancreatic cancer Brother    Ovarian cancer Paternal Grandmother    Dementia Paternal Grandmother    Heart disease Paternal Grandfather    Kidney disease Paternal Grandfather    Hypertension Other    Hyperlipidemia Other    Lupus Daughter    Diabetes Daughter    Anxiety disorder Daughter    Depression Daughter    Obesity Daughter    Obesity Daughter    Esophageal cancer Neg Hx    Colon cancer Neg Hx    Stomach cancer Neg Hx    Atrial fibrillation Neg Hx      HOME MEDICATIONS: Allergies as of 02/23/2024       Reactions   Lisinopril Other (See Comments)   Cough    Metformin  Hcl Other (See Comments)   Trulicity  [dulaglutide ] Other (See Comments)        Medication List        Accurate as of February 23, 2024  3:03 PM. If you have any questions, ask your nurse or doctor.          Azilsartan-Chlorthalidone  40-25 MG Tabs Take 1 tablet by mouth daily.    bismuth subsalicylate 262 MG chewable tablet Commonly known as: PEPTO BISMOL Chew 524 mg by mouth as needed for indigestion or diarrhea or loose stools.   chlorthalidone  25 MG tablet Commonly known as: HYGROTON  Take 1 tablet by mouth daily.   Dexcom G7 Sensor Misc 1 Device by Does not apply route as directed.   diltiazem  120 MG 24 hr capsule Commonly known as: CARDIZEM  CD Take 1 capsule by mouth daily.   Eliquis  5 MG Tabs tablet Generic drug: apixaban  Take 1 tablet (5 mg total) by mouth 2 (two) times daily.   insulin  aspart 100 UNIT/ML injection Commonly known  as: novoLOG  Max daily 100 units via pump   Insulin  Pen Needle 32G X 4 MM Misc 1 Device by Does not apply route in the morning, at noon, in the evening, and at bedtime.   NovoLIN  N FlexPen 100 UNIT/ML FlexPen Generic drug: Insulin  NPH (Human) (Isophane) Inject 30 Units into the skin in the morning and at bedtime.   olmesartan 40 MG tablet Commonly known as: BENICAR Take 1 tablet by mouth daily.   Omnipod 5 G7 Pods (Gen 5) Misc 1 Device by Does not apply route every other day.   Skin Tac Adhesive Barrier Wipe Misc 1 Device by Does not apply route daily in the afternoon.   Vilazodone  HCl 10 MG Tabs Commonly known as: VIIBRYD  Take 1 tablet (10 mg total) by mouth daily.         OBJECTIVE:   Vital Signs:BP (!) 180/90   Ht 5' 2 (1.575 m)   Wt 284 lb (128.8 kg)   LMP 12/18/2014 (Exact Date)   BMI 51.94 kg/m   Wt Readings from Last 3 Encounters:  02/23/24 284 lb (128.8 kg)  02/07/24 279 lb (126.6 kg)  10/20/23 282 lb (127.9 kg)     Exam: General: Pt appears well and is in NAD  Lungs: Clear with good BS bilat   Heart: RRR   Extremities: Left ankle swelling noted, no tenderness  Neuro: MS is good with appropriate affect, pt is alert and Ox3   DM foot exam 10/20/2023 The skin of the feet is intact without sores or ulcerations. The pedal pulses are 2+ on right and 2+ on left. The sensation is  decreased to a screening 5.07, 10 gram monofilament on the left     DATA REVIEWED:  Labs through Care Everywhere 12/16/2023  Sodium 132 Potassium 3.5 Glucose 609 Creatinine 0.97 GFR 71    Old records , labs and images have been reviewed.    ASSESSMENT / PLAN / RECOMMENDATIONS:   1) Type 2 Diabetes Mellitus, Poorly Controlled , With neuropathic  and macrovascular complications - Most recent A1c of 14.2 %. Goal A1c < 7.0 %.    -In reviewing Dexcom download, the patient has been noted with great improvement in her glycemic control.  GMI 9.9% (last visit 3 weeks ago her A1c was 14.2%) -Intolerant to Mounjaro , Trulicity  and Ozempic  -She self discontinued glimepiride  -Patient attributes abdominal cramps and diarrhea to Tresiba  and Lantus  - We again counseled the patient regarding the importance of avoiding sugar sweetened beverages and choosing low-carb snacks - I have praised the patient on improving glycemic control and encouraged her to continue with lifestyle changes  MEDICATIONS: Continue Novolin -N 30 units twice daily Increase NovoLog  18 units 3 times daily before every meal Take NovoLog  8 units with a snack Correction factor: NovoLog  (BG -130/25) TIDQAC   EDUCATION / INSTRUCTIONS: BG monitoring instructions: Patient is instructed to check her blood sugars 3 times a day, before meals  2) Diabetic complications:  Eye:She does not have known diabetic retinopathy. Pt urged to schedule an eye exam again Neuro/ Feet: Does have known diabetic peripheral neuropathy. Renal: Patient does not have known baseline CKD. She is on on an ACEI/ARB at present.  3) Left Ankle :  - No injury - Patient advised to use Tylenol  (avoid NSAIDs due to elevated BP) - Apply ice 2-3 times a day, keep legs elevated  4) HTN:  -Asymptomatic - BP trended down - The patient did consume too much salt last night - Patient is  compliant with antihypertensive medications - Counseled about low-salt  diet  Follow-up in 3 months   Signed electronically by: Stefano Redgie Butts, MD  [facility] Endocrinology  [facility] [address] [place] [place], [place] [address] Phone: [phone] FAX: [phone]   CC: Jolee Madelin Patch, MD [address] [place] [address] Phone: [phone]  Fax: [phone]  Return to Endocrinology clinic as below: Future Appointments  Date Time Provider Department Center  05/04/2024  3:30 PM Carvin Arvella HERO, MD [facility] None

## 2024-03-27 ENCOUNTER — Encounter (HOSPITAL_COMMUNITY): Payer: Self-pay

## 2024-03-27 ENCOUNTER — Encounter: Payer: Self-pay | Admitting: Internal Medicine

## 2024-03-27 NOTE — Telephone Encounter (Signed)
 See dexcom download in media.

## 2024-03-27 NOTE — Progress Notes (Unsigned)
 BH MD/PA/NP OP Progress Note  03/28/2024 9:17 AM Lindsay Gomez  MRN:  [id number]  Visit Diagnosis:    ICD-10-CM   1. Moderate episode of recurrent major depressive disorder (HCC)  F33.1 Vilazodone  HCl 20 MG TABS      Assessment: Lindsay Gomez is a 54 y.o. female with a history of depression, anxiety, diabetes, hypertension, and stroke who presented to [facility] at [facility] for initial evaluation on 08/24/2023.    At initial evaluation patient reported history of anxiety and depression for which she does have a predisposition.  While she had endorsed symptoms consistent with major depressive disorder in the past as well as significant anxiety symptoms these have improved substantially during the 2 weeks prior to initial evaluation.  This was related to an improvement in work stressors which was the primary trigger for the increase in anxiety and depression.  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  Patient met criteria for MDD and adjustment disorder with anxious mood at initial evaluation.  Lindsay Gomez presents for follow-up evaluation. Today, 03/28/2024, patient has had an increase in depression, anxiety, and irritability largely related to increased psychosocial stressors in the new year.  She is still engaging in therapy and taking medications consistently but is no longer experiencing same level of benefit.  Will titrate Viibryd  to 20 mg daily and reviewed risk and benefits.  Furthermore recommended patient reach out to schedule sleep study given the impact it can be having on fatigue, irritability, anxiety, and memory.  Furthermore she does have risk factors of snoring and elevated BMI.  Will follow-up in a month.  Risk Assessment: An assessment of suicide and violence risk factors was performed as part of  this evaluation and is not significantly changed from the last visit. While future psychiatric events cannot be accurately predicted, the patient does not currently require acute inpatient psychiatric care and does not currently meet [facility]  involuntary commitment criteria. Patient was given contact information for crisis resources, behavioral health clinic and was instructed to call 911 for emergencies.   Plan: # Moderate episode of recurrent major depressive disorder (HCC) Past medication trials: None Status of problem: Ongoing Interventions: - Increase Viibryd  to 20 mg daily - Continue individual therapy every 2 weeks. - Sleep study referral  # Adjustment disorder with anxious mood Past medication trials: None Status of problem: Ongoing Interventions: - Same  Risk Assessment: An assessment of suicide and violence risk factors was performed as part of this evaluation and is not significantly changed from the last visit. While future psychiatric events cannot be accurately predicted, the patient does not currently require acute inpatient psychiatric care and does not currently meet [facility]  involuntary commitment criteria. Patient was given contact information for crisis resources, behavioral health clinic and was instructed to call 911 for emergencies.   Chief Complaint:  Chief Complaint  Patient presents with   Follow-up   HPI: Patient presents report that she has gotten quite and adjusted and needs a little boost. She has begun to feel a bit more anxious getting to the point of feeling foggy again.  Patient describes feeling overwhelmed at school and anxiety developing Sunday night prior to returning.  Symptoms are then present throughout the week most prevalent during the day and right after school.  She has restarted her naps immediately after school.  Friday night  she decompresses and then she enjoys Saturday and most of Sunday before the anxiety restarts.  Patient  reports that the class has still been manageable but there has been more administrative difficulties at work.  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  Patient is still engaged in therapy and taking her medication consistently.  Outside of some initial nausea she denies any adverse medication side effects.  Discussed titrating Viibryd  today and reviewed risk and benefits.  We also reviewed sleep study patient had been referred but declined due to schedule conflicts.  Provided her with contact info encouraged her to reach out to reschedule perhaps during her spring break.  Past Psychiatric History:  Past psychiatric diagnoses: Anxiety and depression and grief over the years, never had medicine brought up Psychiatric hospitalizations:None, had one ER visit earlier 2024 Past suicide attempts: Denies Hx of self harm: Denies Hx of violence towards others: Denies Prior psychiatric providers: Denies other then meeting with Dr. Susen once [facility] Prior therapy: Ms. Verla Cobbs since Jan 2025 @ [facility] sees her every other week. Prior to that saw a therapist at [facility] for a year.  Access to firearms: denies  Prior medication trials: Prescribed Zoloft  and trazodone  but patient never started it.  Substance use: Denied any recent substance use. She did try a couple marijuana edibles in the past year, but nothing since November 2024.   Past Medical History:  Past Medical History:  Diagnosis Date   Allergy    rhinitis   Anemia    Anxiety    Atrial fibrillation (HCC)    CHF (congestive heart failure) (HCC)    Depression    Diabetes mellitus without complication (HCC)    glucose usually 140-150   Enlarged heart    Hyperlipidemia    Hypertension    Morbid obesity (HCC)    Shortness of  breath dyspnea    with low iron   Sleep apnea    Stroke [facility])     Past Surgical History:  Procedure Laterality Date   ABDOMINAL HYSTERECTOMY N/A 01/29/2015   Procedure: TOTAL ABDOMINAL HYSTERECTOMY ;  Surgeon: Rome Rigg, MD;  Location: [facility];  Service: Gynecology;  Laterality: N/A;   BILATERAL SALPINGECTOMY Bilateral 01/29/2015   Procedure: BILATERAL SALPINGECTOMY;  Surgeon: Rome Rigg, MD;  Location: [facility];  Service: Gynecology;  Laterality: Bilateral;   BIOPSY  03/08/2023   Procedure: BIOPSY;  Surgeon: Aneita Gwendlyn DASEN, MD;  Location: [facility];  Service: Gastroenterology;;   CESAREAN SECTION     COLONOSCOPY WITH PROPOFOL  N/A 03/08/2023   Procedure: COLONOSCOPY WITH PROPOFOL ;  Surgeon: Aneita Gwendlyn DASEN, MD;  Location: [facility];  Service: Gastroenterology;  Laterality: N/A;   ESOPHAGOGASTRODUODENOSCOPY (EGD) WITH PROPOFOL  N/A 03/08/2023   Procedure: ESOPHAGOGASTRODUODENOSCOPY (EGD) WITH PROPOFOL ;  Surgeon: Aneita Gwendlyn DASEN, MD;  Location: [facility];  Service: Gastroenterology;  Laterality: N/A;   KNEE ARTHROSCOPY Right 03/16/1990   POLYPECTOMY  03/08/2023   Procedure: POLYPECTOMY;  Surgeon: Aneita Gwendlyn DASEN, MD;  Location: [facility];  Service: Gastroenterology;;   SUBMUCOSAL TATTOO INJECTION  03/08/2023   Procedure: SUBMUCOSAL TATTOO INJECTION;  Surgeon: Aneita Gwendlyn DASEN, MD;  Location: [facility];  Service: Gastroenterology;;   Family History:  Family History  Problem Relation Age of Onset   Hypertension Mother    Diabetes  Mother    Coronary artery disease Father 31   Heart failure Father    Heart attack Father    Diabetes Father    Multiple myeloma Father    Stroke Brother 71   Pancreatic cancer Brother    Ovarian cancer Paternal Grandmother    Dementia Paternal Grandmother    Heart disease Paternal Grandfather    Kidney disease Paternal Grandfather    Hypertension Other    Hyperlipidemia Other    Lupus Daughter    Diabetes Daughter     Anxiety disorder Daughter    Depression Daughter    Obesity Daughter    Obesity Daughter    Esophageal cancer Neg Hx    Colon cancer Neg Hx    Stomach cancer Neg Hx    Atrial fibrillation Neg Hx     Social History:  Social History   Socioeconomic History   Marital status: Single    Spouse name: Not on file   Number of children: 2   Years of education: Not on file   Highest education level: Not on file  Occupational History   Occupation: Kindergarten Runner, Broadcasting/film/video    Comment: Programmer, Multimedia  Tobacco Use   Smoking status: Never   Smokeless tobacco: Never  Vaping Use   Vaping status: Never Used  Substance and Sexual Activity   Alcohol use: No   Drug use: No   Sexual activity: Not Currently    Birth control/protection: None  Other Topics Concern   Not on file  Social History Narrative   Pt lives alone in 2 story home   Has 2 children   Masters degree   Works as an programmer, systems    Social Drivers of Health   Tobacco Use: Low Risk (03/28/2024)   Patient History    Smoking Tobacco Use: Never    Smokeless Tobacco Use: Never    Passive Exposure: Not on file  Financial Resource Strain: Not on file  Food Insecurity: Low Risk (01/25/2024)   Received from [facility]   Epic    Within the past 12 months, you worried that your food would run out before you got money to buy more: Never true    Within the past 12 months, the food you bought just didn't last and you didn't have money to get more. : Never true  Transportation Needs: No Transportation Needs (01/25/2024)   Received from [facility]    In the past 12 months, has lack of reliable transportation kept you from medical appointments, meetings, work or from getting things needed for daily living? : No  Physical Activity: Not on file  Stress: Not on file  Social Connections: Not on file  Depression (PHQ2-9): Low Risk (08/24/2023)   Depression (PHQ2-9)    PHQ-2 Score: 0  Alcohol Screen: Not on file   Housing: Low Risk (02/06/2024)   Received from [facility]   Epic    What is your living situation today?: I have a steady place to live    Think about the place you live. Do you have problems with any of the following? Choose all that apply:: None/None on this list  Utilities: Low Risk (01/25/2024)   Received from [facility]   Utilities    In the past 12 months has the electric, gas, oil, or water company threatened to shut off services in your home? : No  Health Literacy: Not on file    Allergies:  Allergies  Allergen Reactions   Lisinopril  Other (See Comments)    Cough    Metformin  Hcl Other (See Comments)   Trulicity  [Dulaglutide ] Other (See Comments)    Current Medications: Current Outpatient Medications  Medication Sig Dispense Refill   Vilazodone  HCl 20 MG TABS Take 1 tablet (20 mg total) by mouth daily. 90 tablet 0   bismuth subsalicylate (PEPTO BISMOL) 262 MG chewable tablet Chew 524 mg by mouth as needed for indigestion or diarrhea or loose stools.     chlorthalidone  (HYGROTON ) 25 MG tablet Take 1 tablet by mouth daily.     Continuous Glucose Sensor (DEXCOM G7 SENSOR) MISC 1 Device by Does not apply route as directed. 9 each 3   diltiazem  (CARDIZEM  CD) 120 MG 24 hr capsule Take 1 capsule by mouth daily.     ELIQUIS  5 MG TABS tablet Take 1 tablet (5 mg total) by mouth 2 (two) times daily. 60 tablet 2   insulin  aspart (NOVOLOG ) 100 UNIT/ML injection Max daily 100 units via pump 100 mL 3   Insulin  Disposable Pump (OMNIPOD 5 G7 PODS, GEN 5,) MISC 1 Device by Does not apply route every other day. 45 each 3   Insulin  NPH, Human,, Isophane, (NOVOLIN  N FLEXPEN) 100 UNIT/ML Kiwkpen Inject 30 Units into the skin in the morning and at bedtime. 30 mL 3   Insulin  Pen Needle 32G X 4 MM MISC 1 Device by Does not apply route in the morning, at noon, in the evening, and at bedtime. 400 each 3   olmesartan (BENICAR) 40 MG tablet Take 1 tablet by mouth daily.     Ostomy Supplies  (SKIN TAC ADHESIVE BARRIER WIPE) MISC 1 Device by Does not apply route daily in the afternoon. 10 each 1   No current facility-administered medications for this visit.     Psychiatric Specialty Exam: Last menstrual period 12/18/2014.There is no height or weight on file to calculate BMI. Review of Systems  General Appearance: Fairly Groomed  Eye Contact:  Fair  Speech:  Clear and Coherent and Normal Rate  Volume:  Normal  Mood:  Anxious and Depressed  Affect:  Congruent  Thought Content: Logical   Suicidal Thoughts:  No  Homicidal Thoughts:  No  Thought Process:  Coherent  Orientation:  Full (Time, Place, and Person)    Memory: Immediate;   Good  Judgment:  Fair  Insight:  Fair  Concentration:  Concentration: Good  Recall:  not formally assessed   Fund of Knowledge: Fair  Language: Good  Psychomotor Activity:  Normal  Akathisia:  NA  AIMS (if indicated): not done  Assets:  Communication Skills Desire for Improvement Financial Resources/Insurance Housing Transportation Vocational/Educational  ADL's:  Intact  Cognition: WNL  Sleep:  Fair   Metabolic Disorder Labs: Lab Results  Component Value Date   HGBA1C 14.4 (A) 02/07/2024   MPG 162.81 05/03/2021   MPG 243 08/11/2020   No results found for: PROLACTIN Lab Results  Component Value Date   CHOL 128 04/06/2022   TRIG 68 04/06/2022   HDL 53 04/06/2022   CHOLHDL 4.2 05/03/2021   VLDL 19 05/03/2021   LDLCALC 61 04/06/2022   LDLCALC 113 (H) 05/03/2021   Lab Results  Component Value Date   TSH 1.092 05/03/2021   TSH 0.87 06/15/2018    Therapeutic Level Labs: No results found for: LITHIUM No results found for: VALPROATE No results found for: CBMZ   Screenings: GAD-7    Flowsheet Row [facility]  Total GAD-7 Score 19   PHQ2-9    Flowsheet Row Office Visit [facility] 08/24/2023 in [facility]  Nutrition from 05/13/2017 in [facility]  - A Dept Of [place]. [facility] Nutrition from 08/15/2015 in [facility]  - A Dept Of [facility]. [facility]  PHQ-2 Total Score 0 4 0   Flowsheet Row ED from 01/28/2024 in [facility] Office Visit from 08/24/2023 in [facility] ED from 06/20/2023 in [facility]  C-SSRS RISK CATEGORY No Risk No Risk No Risk    Collaboration of Care: Collaboration of Care: Medication Management AEB medication prescription and Other provider involved in patient's care AEB cardiology, hospital, nutrition, and endocrinology chart review  Patient/Guardian was advised Release of Information must be obtained prior to any record release in order to collaborate their care with an outside provider. Patient/Guardian was advised if they have not already done so to contact the registration department to sign all necessary forms in order for us  to release information regarding their care.   Consent: Patient/Guardian gives verbal consent for treatment and assignment of benefits for services provided during this visit. Patient/Guardian expressed understanding and agreed to proceed.    Lindsay CHRISTELLA Finder, MD 03/28/2024, 9:17 AM   Virtual Visit via Video Note  I connected with Lindsay Gomez on 03/28/2024 at  9:00 AM EST by a video enabled telemedicine application and verified that I am speaking with the correct person using two identifiers.  Location: Patient: At work Provider: Home Office   I discussed the limitations of evaluation and management by telemedicine and the availability of in person appointments. The patient expressed understanding and agreed to proceed.   I discussed the assessment and treatment plan with the patient. The patient was provided an opportunity to ask questions and all were answered. The patient agreed with  the plan and demonstrated an understanding of the instructions.   The patient was advised to call back or seek an in-person evaluation if the symptoms worsen or if the condition fails to improve as anticipated.  I provided 15 minutes of non-face-to-face time during this encounter.   Lindsay CHRISTELLA Finder, MD

## 2024-03-28 ENCOUNTER — Encounter (HOSPITAL_COMMUNITY): Payer: Self-pay | Admitting: Psychiatry

## 2024-03-28 ENCOUNTER — Telehealth (HOSPITAL_COMMUNITY): Admitting: Psychiatry

## 2024-03-28 DIAGNOSIS — F331 Major depressive disorder, recurrent, moderate: Secondary | ICD-10-CM | POA: Diagnosis not present

## 2024-03-28 MED ORDER — VILAZODONE HCL 20 MG PO TABS: 20.0000 mg | ORAL_TABLET | Freq: Every day | ORAL | 0 refills | Status: AC

## 2024-05-04 ENCOUNTER — Telehealth (HOSPITAL_COMMUNITY): Admitting: Psychiatry

## 2024-08-02 ENCOUNTER — Ambulatory Visit: Admitting: Internal Medicine
